# Patient Record
Sex: Female | Born: 1975 | Race: White | Hispanic: No | Smoking: Current every day smoker
Health system: Southern US, Community
[De-identification: ages and names within clinical notes are randomized; demographics above are authoritative.]

## PROBLEM LIST (undated history)

## (undated) DIAGNOSIS — I7410 Embolism and thrombosis of unspecified parts of aorta: Secondary | ICD-10-CM

## (undated) DIAGNOSIS — D735 Infarction of spleen: Secondary | ICD-10-CM

## (undated) HISTORY — DX: Infarction of spleen: D73.5

## (undated) HISTORY — DX: Embolism and thrombosis of unspecified parts of aorta: I74.10

---

## 2006-06-17 ENCOUNTER — Emergency Department: Payer: Self-pay | Admitting: Internal Medicine

## 2006-08-29 ENCOUNTER — Emergency Department: Payer: Self-pay | Admitting: Emergency Medicine

## 2006-10-23 ENCOUNTER — Emergency Department: Payer: Self-pay | Admitting: Emergency Medicine

## 2007-02-07 ENCOUNTER — Emergency Department: Payer: Self-pay | Admitting: Internal Medicine

## 2007-02-07 ENCOUNTER — Emergency Department: Payer: Self-pay | Admitting: Emergency Medicine

## 2007-02-09 ENCOUNTER — Emergency Department: Payer: Self-pay | Admitting: Emergency Medicine

## 2007-04-25 ENCOUNTER — Emergency Department: Payer: Self-pay | Admitting: Emergency Medicine

## 2007-09-18 ENCOUNTER — Observation Stay: Payer: Self-pay | Admitting: Unknown Physician Specialty

## 2007-09-29 ENCOUNTER — Observation Stay: Payer: Self-pay

## 2007-10-11 ENCOUNTER — Ambulatory Visit: Payer: Self-pay | Admitting: Unknown Physician Specialty

## 2007-10-12 ENCOUNTER — Inpatient Hospital Stay: Payer: Self-pay | Admitting: Unknown Physician Specialty

## 2007-10-20 ENCOUNTER — Other Ambulatory Visit: Payer: Self-pay

## 2007-10-20 ENCOUNTER — Emergency Department: Payer: Self-pay | Admitting: Unknown Physician Specialty

## 2007-10-22 ENCOUNTER — Emergency Department: Payer: Self-pay | Admitting: Emergency Medicine

## 2007-10-22 ENCOUNTER — Other Ambulatory Visit: Payer: Self-pay

## 2009-05-29 ENCOUNTER — Emergency Department: Payer: Self-pay | Admitting: Emergency Medicine

## 2011-04-23 ENCOUNTER — Emergency Department: Payer: Self-pay | Admitting: Emergency Medicine

## 2011-04-23 LAB — URINALYSIS, COMPLETE
Bilirubin,UR: NEGATIVE
Ketone: NEGATIVE
Ph: 7 (ref 4.5–8.0)
Protein: 30
RBC,UR: 3 /HPF (ref 0–5)
Specific Gravity: 1.003 (ref 1.003–1.030)
Squamous Epithelial: 4
WBC UR: 53 /HPF (ref 0–5)

## 2011-04-23 LAB — CBC
HCT: 50.5 % — ABNORMAL HIGH (ref 35.0–47.0)
HGB: 17.1 g/dL — ABNORMAL HIGH (ref 12.0–16.0)
MCH: 33 pg (ref 26.0–34.0)
MCHC: 33.9 g/dL (ref 32.0–36.0)
MCV: 98 fL (ref 80–100)
RBC: 5.18 10*6/uL (ref 3.80–5.20)

## 2011-04-23 LAB — PREGNANCY, URINE: Pregnancy Test, Urine: NEGATIVE m[IU]/mL

## 2011-04-23 LAB — COMPREHENSIVE METABOLIC PANEL
Anion Gap: 10 (ref 7–16)
BUN: 2 mg/dL — ABNORMAL LOW (ref 7–18)
Bilirubin,Total: 0.4 mg/dL (ref 0.2–1.0)
Calcium, Total: 8.9 mg/dL (ref 8.5–10.1)
Chloride: 105 mmol/L (ref 98–107)
Co2: 25 mmol/L (ref 21–32)
Creatinine: 0.63 mg/dL (ref 0.60–1.30)
EGFR (African American): >60
Potassium: 3.9 mmol/L (ref 3.5–5.1)
SGOT(AST): 21 U/L (ref 15–37)
Sodium: 140 mmol/L (ref 136–145)
Total Protein: 7.9 g/dL (ref 6.4–8.2)

## 2011-04-23 LAB — LIPASE, BLOOD: Lipase: 98 U/L (ref 73–393)

## 2012-04-06 ENCOUNTER — Emergency Department: Payer: Self-pay | Admitting: Emergency Medicine

## 2014-06-24 ENCOUNTER — Emergency Department: Payer: Self-pay | Admitting: Emergency Medicine

## 2014-12-21 DIAGNOSIS — D735 Infarction of spleen: Secondary | ICD-10-CM

## 2014-12-21 HISTORY — DX: Infarction of spleen: D73.5

## 2015-01-15 ENCOUNTER — Encounter: Payer: Self-pay | Admitting: Emergency Medicine

## 2015-01-15 ENCOUNTER — Emergency Department: Payer: Medicaid Other

## 2015-01-15 ENCOUNTER — Other Ambulatory Visit: Payer: Self-pay

## 2015-01-15 ENCOUNTER — Inpatient Hospital Stay
Admission: EM | Admit: 2015-01-15 | Discharge: 2015-01-18 | DRG: 300 | Disposition: A | Discharge status: Home / Self Care | Payer: Medicaid Other | Attending: Specialist | Admitting: Specialist

## 2015-01-15 DIAGNOSIS — D6859 Other primary thrombophilia: Secondary | ICD-10-CM | POA: Diagnosis present

## 2015-01-15 DIAGNOSIS — I7410 Embolism and thrombosis of unspecified parts of aorta: Secondary | ICD-10-CM

## 2015-01-15 DIAGNOSIS — F17210 Nicotine dependence, cigarettes, uncomplicated: Secondary | ICD-10-CM | POA: Diagnosis present

## 2015-01-15 DIAGNOSIS — D735 Infarction of spleen: Secondary | ICD-10-CM | POA: Diagnosis present

## 2015-01-15 DIAGNOSIS — I7411 Embolism and thrombosis of thoracic aorta: Principal | ICD-10-CM | POA: Diagnosis present

## 2015-01-15 LAB — URINALYSIS COMPLETE WITH MICROSCOPIC (ARMC ONLY)
Bilirubin Urine: NEGATIVE
Glucose, UA: NEGATIVE mg/dL
Hgb urine dipstick: NEGATIVE
Ketones, ur: NEGATIVE mg/dL
Nitrite: NEGATIVE
PROTEIN: NEGATIVE mg/dL
Specific Gravity, Urine: 1.004 — ABNORMAL LOW (ref 1.005–1.030)
pH: 6 (ref 5.0–8.0)

## 2015-01-15 LAB — CBC
HCT: 54.9 % — ABNORMAL HIGH (ref 35.0–47.0)
HEMOGLOBIN: 18.3 g/dL — AB (ref 12.0–16.0)
MCH: 30.5 pg (ref 26.0–34.0)
MCHC: 33.3 g/dL (ref 32.0–36.0)
MCV: 91.6 fL (ref 80.0–100.0)
PLATELETS: 267 10*3/uL (ref 150–440)
RBC: 6 MIL/uL — AB (ref 3.80–5.20)
RDW: 14.3 % (ref 11.5–14.5)
WBC: 16.3 10*3/uL — ABNORMAL HIGH (ref 3.6–11.0)

## 2015-01-15 LAB — COMPREHENSIVE METABOLIC PANEL
ALT: 12 U/L — AB (ref 14–54)
AST: 36 U/L (ref 15–41)
Albumin: 3.6 g/dL (ref 3.5–5.0)
Alkaline Phosphatase: 85 U/L (ref 38–126)
Anion gap: 12 (ref 5–15)
BUN: <5 mg/dL — ABNORMAL LOW (ref 6–20)
CHLORIDE: 101 mmol/L (ref 101–111)
CO2: 20 mmol/L — AB (ref 22–32)
CREATININE: 0.68 mg/dL (ref 0.44–1.00)
Calcium: 9.2 mg/dL (ref 8.9–10.3)
GFR calc non Af Amer: >60 mL/min (ref >60)
Glucose, Bld: 136 mg/dL — ABNORMAL HIGH (ref 65–99)
Potassium: 3.4 mmol/L — ABNORMAL LOW (ref 3.5–5.1)
SODIUM: 133 mmol/L — AB (ref 135–145)
Total Bilirubin: 0.5 mg/dL (ref 0.3–1.2)
Total Protein: 7.3 g/dL (ref 6.5–8.1)

## 2015-01-15 LAB — LIPASE, BLOOD: LIPASE: 24 U/L (ref 22–51)

## 2015-01-15 LAB — PROTIME-INR
INR: 1.08
Prothrombin Time: 14.2 seconds (ref 11.4–15.0)

## 2015-01-15 LAB — POCT PREGNANCY, URINE: PREG TEST UR: NEGATIVE

## 2015-01-15 LAB — APTT: APTT: 28 s (ref 24–36)

## 2015-01-15 MED ORDER — OXYCODONE HCL 5 MG PO TABS
5.0000 mg | ORAL_TABLET | ORAL | Status: DC | PRN
  Administered 2015-01-15 – 2015-01-18 (×9): 5 mg via ORAL
  Filled 2015-01-15 (×9): qty 1

## 2015-01-15 MED ORDER — IOHEXOL 300 MG/ML  SOLN
100.0000 mL | Freq: Once | INTRAMUSCULAR | Status: AC | PRN
  Administered 2015-01-15: 100 mL via INTRAVENOUS
  Filled 2015-01-15: qty 100

## 2015-01-15 MED ORDER — NICOTINE 10 MG IN INHA
1.0000 | RESPIRATORY_TRACT | Status: DC | PRN
  Administered 2015-01-15: 1 via RESPIRATORY_TRACT

## 2015-01-15 MED ORDER — HEPARIN (PORCINE) IN NACL 100-0.45 UNIT/ML-% IJ SOLN: 14.0000 [IU]/kg/h | Freq: Once | INTRAMUSCULAR | Status: DC

## 2015-01-15 MED ORDER — ONDANSETRON HCL 4 MG PO TABS: 4.0000 mg | ORAL_TABLET | Freq: Four times a day (QID) | ORAL | Status: DC | PRN

## 2015-01-15 MED ORDER — MORPHINE SULFATE (PF) 4 MG/ML IV SOLN
INTRAVENOUS | Status: AC
  Administered 2015-01-15: 4 mg via INTRAVENOUS
  Filled 2015-01-15: qty 1

## 2015-01-15 MED ORDER — SODIUM CHLORIDE 0.9 % IV SOLN
INTRAVENOUS | Status: DC
  Administered 2015-01-15: 23:00:00 via INTRAVENOUS
  Administered 2015-01-16: 1000 mL via INTRAVENOUS
  Administered 2015-01-17 – 2015-01-18 (×2): via INTRAVENOUS

## 2015-01-15 MED ORDER — ONDANSETRON HCL 4 MG/2ML IJ SOLN
4.0000 mg | Freq: Four times a day (QID) | INTRAMUSCULAR | Status: DC | PRN
Start: 2015-01-15 — End: 2015-01-18
  Administered 2015-01-16 – 2015-01-17 (×4): 4 mg via INTRAVENOUS
  Filled 2015-01-15 (×4): qty 2

## 2015-01-15 MED ORDER — NICOTINE 10 MG IN INHA: 1.0000 | RESPIRATORY_TRACT | Status: DC | PRN

## 2015-01-15 MED ORDER — SODIUM CHLORIDE 0.9 % IJ SOLN
3.0000 mL | Freq: Two times a day (BID) | INTRAMUSCULAR | Status: DC
  Administered 2015-01-15 – 2015-01-18 (×3): 3 mL via INTRAVENOUS

## 2015-01-15 MED ORDER — ACETAMINOPHEN 325 MG PO TABS: 650.0000 mg | ORAL_TABLET | Freq: Four times a day (QID) | ORAL | Status: DC | PRN

## 2015-01-15 MED ORDER — NICOTINE 10 MG IN INHA
RESPIRATORY_TRACT | Status: AC
  Administered 2015-01-15: 1 via RESPIRATORY_TRACT
  Filled 2015-01-15: qty 36

## 2015-01-15 MED ORDER — HEPARIN BOLUS VIA INFUSION
4100.0000 [IU] | Freq: Once | INTRAVENOUS | Status: AC
  Administered 2015-01-15: 4100 [IU] via INTRAVENOUS
  Filled 2015-01-15: qty 4100

## 2015-01-15 MED ORDER — HEPARIN (PORCINE) IN NACL 100-0.45 UNIT/ML-% IJ SOLN
1450.0000 [IU]/h | INTRAMUSCULAR | Status: DC
  Administered 2015-01-15: 1150 [IU]/h via INTRAVENOUS
  Administered 2015-01-16: 1300 [IU]/h via INTRAVENOUS
  Administered 2015-01-17 – 2015-01-18 (×2): 1450 [IU]/h via INTRAVENOUS
  Filled 2015-01-15 (×8): qty 250

## 2015-01-15 MED ORDER — MORPHINE SULFATE (PF) 4 MG/ML IV SOLN
4.0000 mg | Freq: Once | INTRAVENOUS | Status: AC
  Administered 2015-01-15: 4 mg via INTRAVENOUS

## 2015-01-15 MED ORDER — HEPARIN SODIUM (PORCINE) 5000 UNIT/ML IJ SOLN: 4000.0000 [IU] | Freq: Once | INTRAMUSCULAR | Status: DC

## 2015-01-15 MED ORDER — ONDANSETRON HCL 4 MG/2ML IJ SOLN
4.0000 mg | Freq: Once | INTRAMUSCULAR | Status: AC
  Administered 2015-01-15: 4 mg via INTRAVENOUS

## 2015-01-15 MED ORDER — ONDANSETRON HCL 4 MG/2ML IJ SOLN
INTRAMUSCULAR | Status: AC
  Administered 2015-01-15: 4 mg via INTRAVENOUS
  Filled 2015-01-15: qty 2

## 2015-01-15 MED ORDER — IOHEXOL 240 MG/ML SOLN
25.0000 mL | Freq: Once | INTRAMUSCULAR | Status: AC | PRN
  Administered 2015-01-15: 25 mL via ORAL
  Filled 2015-01-15: qty 25

## 2015-01-15 MED ORDER — ACETAMINOPHEN 650 MG RE SUPP: 650.0000 mg | Freq: Four times a day (QID) | RECTAL | Status: DC | PRN

## 2015-01-15 MED ORDER — LORAZEPAM 2 MG/ML IJ SOLN
2.0000 mg | Freq: Once | INTRAMUSCULAR | Status: AC
  Administered 2015-01-15: 2 mg via INTRAVENOUS
  Filled 2015-01-15: qty 1

## 2015-01-15 MED ORDER — IOHEXOL 350 MG/ML SOLN
75.0000 mL | Freq: Once | INTRAVENOUS | Status: AC | PRN
  Administered 2015-01-15: 75 mL via INTRAVENOUS
  Filled 2015-01-15: qty 75

## 2015-01-15 MED ORDER — MORPHINE SULFATE (PF) 2 MG/ML IV SOLN
2.0000 mg | INTRAVENOUS | Status: DC | PRN
  Administered 2015-01-16 (×2): 2 mg via INTRAVENOUS
  Filled 2015-01-15 (×2): qty 1

## 2015-01-15 NOTE — ED Notes (Signed)
Pt with epigastric pain started yesterday and worse today. Pt states she feels like someone is smashing her insides, non radiating.

## 2015-01-15 NOTE — ED Provider Notes (Signed)
[facility] Emergency Department Provider Note  ____________________________________________  Time seen: 7:05 PM  I have reviewed the triage vital signs and the nursing notes.   HISTORY  Chief Complaint Abdominal Pain      HPI Suzanne [name] is a 39 y.o. female presents with epigastric/midline abdominal pain 2 days accompanied by nonbloody vomiting. Patient denies any fever no diarrhea or constipation. Patient does admit to drinking approximately a 12 pack of beer daily however has not done so in the past 2 days     Past medical history None There are no active problems to display for this patient.   Past surgical history C-section No current outpatient prescriptions on file.  Allergies No known drug allergies No family history on file.  Social History Social History  Substance Use Topics  . Smoking status: Current Some Day Smoker  . Smokeless tobacco: None  . Alcohol Use: Yes    Review of Systems  Constitutional: Negative for fever. Eyes: Negative for visual changes. ENT: Negative for sore throat. Cardiovascular: Negative for chest pain. Respiratory: Negative for shortness of breath. Gastrointestinal: Positive for abdominal pain and vomiting Genitourinary: Negative for dysuria. Musculoskeletal: Negative for back pain. Skin: Negative for rash. Neurological: Negative for headaches, focal weakness or numbness.   10-point ROS otherwise negative.  ____________________________________________   PHYSICAL EXAM:  VITAL SIGNS: ED Triage Vitals  Enc Vitals Group     BP 01/15/15 1610 169/98 mmHg     Pulse Rate 01/15/15 1610 89     Resp 01/15/15 1610 18     Temp 01/15/15 1610 98.5 F (36.9 C)     Temp Source 01/15/15 1610 Oral     SpO2 01/15/15 1610 97 %     Weight 01/15/15 1610 148 lb (67.132 kg)     Height 01/15/15 1610 5\' 7"  (1.702 m)     Head Cir --      Peak Flow --      Pain Score 01/15/15 1611 10     Pain Loc --       Pain Edu? --      Excl. in [place]? --      Constitutional: Alert and oriented. Well appearing and in no distress. Eyes: Conjunctivae are normal. PERRL. Normal extraocular movements. ENT   Head: Normocephalic and atraumatic.   Nose: No congestion/rhinnorhea.   Mouth/Throat: Mucous membranes are moist.   Neck: No stridor. Hematological/Lymphatic/Immunilogical: No cervical lymphadenopathy. Cardiovascular: Normal rate, regular rhythm. Normal and symmetric distal pulses are present in all extremities. No murmurs, rubs, or gallops. Respiratory: Normal respiratory effort without tachypnea nor retractions. Breath sounds are clear and equal bilaterally. No wheezes/rales/rhonchi. Gastrointestinal: Epigastric tenderness to palpation No distention. There is no CVA tenderness. Genitourinary: deferred Musculoskeletal: Nontender with normal range of motion in all extremities. No joint effusions.  No lower extremity tenderness nor edema. Neurologic:  Normal speech and language. No gross focal neurologic deficits are appreciated. Speech is normal.  Skin:  Skin is warm, dry and intact. No rash noted. Psychiatric: Mood and affect are normal. Speech and behavior are normal. Patient exhibits appropriate insight and judgment.  ____________________________________________    LABS (pertinent positives/negatives)  Labs Reviewed  COMPREHENSIVE METABOLIC PANEL - Abnormal; Notable for the following:    Sodium 133 (*)    Potassium 3.4 (*)    CO2 20 (*)    Glucose, Bld 136 (*)    BUN <5 (*)    ALT 12 (*)    All other components within normal  limits  CBC - Abnormal; Notable for the following:    WBC 16.3 (*)    RBC 6.00 (*)    Hemoglobin 18.3 (*)    HCT 54.9 (*)    All other components within normal limits  LIPASE, BLOOD  URINALYSIS COMPLETEWITH MICROSCOPIC ([facility] ONLY)    ____________________________________________   EKG  ED ECG REPORT I, Akhilesh Sassone, [place], the attending physician,  personally viewed and interpreted this ECG.   Date: 01/15/2015  EKG Time: 4:21 PM  Rate: 62  Rhythm: Normal sinus rhythm  Axis: None  Intervals: Normal  ST&T Change: None   ____________________________________________    RADIOLOGY  CT ANGIO CHEST AORTA W/CM &/OR WO/CM (Final result) Result time: 01/15/15 21:05:56   Final result by Rad Results In Interface (01/15/15 21:05:56)   Narrative:   CLINICAL DATA: Aortic thrombus seen on abdominal CT. Evaluate for thoracic origins/extension. Acute embolic splenic infarction.  EXAM: CT ANGIOGRAPHY CHEST WITH CONTRAST  TECHNIQUE: Multidetector CT imaging of the chest was performed using the standard protocol during bolus administration of intravenous contrast. Multiplanar CT image reconstructions and MIPs were obtained to evaluate the vascular anatomy.  CONTRAST: 77mL OMNIPAQUE IOHEXOL 350 MG/ML SOLN  COMPARISON: None.  FINDINGS: Bones: Mild thoracic spondylosis. No aggressive osseous lesion.  Cardiovascular: There is no aortic dissection. The ascending aorta and aortic arch appear normal. Three vessel aortic arch with normal opacification of the branch vessels. In the descending thoracic aorta, the previously seen mural thrombus is present. This becomes pedunculated at the level of the esophageal hiatus. The pedunculated intraluminal component measures 31 mm in length (image 86 series 8). Total length of the thrombus is 10.7 cm, extending from the superior LEFT atrium to the level of the diaphragmatic crus. This arises from the anterior wall of the descending thoracic aorta. No underlying aortic injury is identified to account for thrombus formation. This raises the possibility of a hypercoagulable state.  Lungs: Paraseptal emphysema. No airspace disease.  Central airways: Patent.  Effusions: None.  Lymphadenopathy: No axillary adenopathy. No mediastinal or hilar adenopathy.  Esophagus: Normal.  Other:  None.  Review of the MIP images confirms the above findings.  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 No aortic dissection or underlying acute aortic abnormality to account for the thrombus formation.   Electronically Signed By: Dereck Ligas M.D. On: 01/15/2015 21:05          CT Abdomen Pelvis W Contrast (Final result) Result time: 01/15/15 20:25:10   Final result by Rad Results In Interface (01/15/15 20:25:10)   Narrative:   CLINICAL DATA: Midline abdominal pain. Vomiting. Periumbilical pain. Leukocytosis.  EXAM: CT ABDOMEN AND PELVIS WITH CONTRAST  TECHNIQUE: Multidetector CT imaging of the abdomen and pelvis was performed using the standard protocol following bolus administration of intravenous contrast.  CONTRAST: 123mL OMNIPAQUE IOHEXOL 300 MG/ML SOLN  COMPARISON: None.  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 Marginal osteophytes are also present in the RIGHT femoral head. dextroconvex curve of the thoracolumbar spine may be positional.  Lung Bases: Dependent atelectasis.  Liver: Fatty infiltration adjacent to the falciform ligament of the liver. Mild hepatomegaly with 18.5 cm liver span and rounding of the inferior RIGHT hepatic lobe.  Spleen: Infarction of the spleen is present involving the anterior and superior portions. This is probably acute or subacute.  Gallbladder: Normal.  Common bile duct: Normal.  Pancreas: Mild prominence of pancreatic duct. No obstructing lesion identified. Small lipoma is present in the anterior body of the pancreas.  Adrenal glands: Normal bilaterally.  Kidneys: LEFT kidney appears normal. No calculi. The LEFT ureter is normal.  The RIGHT kidney demonstrates areas of scarring. In the interpolar region,  there is an area of cortical thinning which is most compatible with scarring rather than acute infarct. There are no discrete wedge-shaped perfusion defects in the kidneys. The RIGHT ureter appears normal.  Stomach: Distended with oral contrast. No inflammatory changes.  Small bowel: No obstruction or evidence of intestinal ischemia. No mural thickening.  Colon: Normal appendix. Ascending colon appears normal. Transverse colon is normal. Descending and rectosigmoid colon at normal.  Pelvic Genitourinary: Physiologic appearance of the uterus. Retroverted uterus. Urinary bladder appears normal.  Peritoneum: Physiologic free fluid in the anatomic pelvis. No free air.  Vascular/lymphatic: There is a large exophytic thrombus in the descending thoracic aorta. This probably represents an embolic source of infarction of the spleen. The distal aorta appears normal. Mild iliofemoral atherosclerotic calcification.  Body Wall: Normal.  IMPRESSION: 1. Large descending thoracic aortic thrombus extending from the superior margin of the scan to the aortic hiatus. This likely represents the source of embolic infarction to the spleen. No abdominal aortic dissection or aneurysm. 2. Acute or subacute anterior splenic infarct. 3. 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 4. No findings of acute intestinal ischemia. Mesenteric vasculature appears patent. 5. Mild hepatomegaly. Critical Value/emergent results were called by telephone at the time of interpretation on 01/15/2015 at 8:16 pm to Dr. Marjean Donna , who verbally acknowledged these results. Vascular consultation was discussed during the phone call. Contrast-enhanced imaging of the chest should be considered to fully visualize the aortic thrombus which is only partially seen on this abdominal study.   Electronically Signed By: Dereck Ligas M.D. On: 01/15/2015 20:25      ____________________________________________     Critical Care performed: CRITICAL CARE Performed by: Gregor Hams   Total critical care time: 60 minutes  Critical care time was exclusive of separately billable procedures and treating other patients.  Critical care was necessary to treat or prevent imminent or life-threatening deterioration.  Critical care was time spent personally by me on the following activities: development of treatment plan with patient and/or surrogate as well as nursing, discussions with consultants, evaluation of patient's response to treatment, examination of patient, obtaining history from patient or surrogate, ordering and performing treatments and interventions, ordering and review of laboratory studies, ordering and review of radiographic studies, pulse oximetry and re-evaluation of patient's condition. care  ____________________________________________   INITIAL IMPRESSION / ASSESSMENT AND PLAN / ED COURSE  Pertinent labs & imaging results that were available during my care of the patient were reviewed by me and considered in my medical decision making (see chart for details). Patient received morphine 4 mg and Zofran 4 mg for analgesia and antiemetic respectively. Heparin bolus and drip instituted after discussing the patient with Dr. dew vascular surgeon on call. All clinical findings were discussed with the patient. Patient was discussed with Dr. Lavetta Nielsen hospitalist on call for hospital admission. ____________________________________________   FINAL CLINICAL IMPRESSION(S) / ED DIAGNOSES  Final diagnoses:  Aortic thrombus      Gregor Hams, MD 01/17/15 [phone]

## 2015-01-15 NOTE — Consult Note (Signed)
ANTICOAGULATION CONSULT NOTE - Initial Consult  Pharmacy Consult for heparin Indication: DVT  No Known Allergies  Patient Measurements: Height: 5\' 7"  (170.2 cm) Weight: 148 lb (67.132 kg) IBW/kg (Calculated) : 61.6 Heparin Dosing Weight: 67.1kg  Vital Signs: Temp: 97.9 F (36.6 C) (09/26 2013) Temp Source: Oral (09/26 2013) BP: 167/88 mmHg (09/26 2013) Pulse Rate: 89 (09/26 1610)  Labs:  Recent Labs  01/15/15 1614  HGB 18.3*  HCT 54.9*  PLT 267  CREATININE 0.68    Estimated Creatinine Clearance: 92.7 mL/min (by C-G formula based on Cr of 0.68).   Medical History: History reviewed. No pertinent past medical history.  Medications:  Scheduled:  . heparin  4,100 Units Intravenous Once  . sodium chloride  3 mL Intravenous Q12H    Assessment: Pt is a 39 year old female. Pharmacy consulted to dose heparin for a VTE. Pt does not take anticoagulants at home. Goal of Therapy:  Heparin level 0.3-0.7 units/ml Monitor platelets by anticoagulation protocol: Yes   Plan:  Give 4100 units bolus x 1 Start heparin infusion at 1150 units/hr Check anti-Xa level in 6 hours and daily while on heparin Continue to monitor H&H and platelets  Jonte Wollam D Janifer Gieselman 01/15/2015,9:20 PM

## 2015-01-15 NOTE — ED Notes (Signed)
Pt drinking GI cocktail

## 2015-01-15 NOTE — ED Notes (Signed)
MD at bedside. 

## 2015-01-15 NOTE — H&P (Signed)
Suzanne [name] at [place] NAME: Suzanne [name]    MR#:  [id number]  DATE OF BIRTH:  05/25/1975   DATE OF ADMISSION:  01/15/2015  PRIMARY CARE PHYSICIAN: No PCP Per Patient   REQUESTING/REFERRING PHYSICIAN: Owens Shark  CHIEF COMPLAINT:   Chief Complaint  Patient presents with  . Abdominal Pain    HISTORY OF PRESENT ILLNESS:  Suzanne [name]  is a 39 y.o. female without significant medical history presenting with abdominal pain. She describes one day duration of abdominal pain epigastric/left upper quadrant described as "pain" intensity 10/10 nonradiating and no worsening or relieving factors. She also describes having associated nausea vomiting nonbloody nonbilious emesis. Initial workup revealed descending aortic thrombus as well as splenic infarct.  PAST MEDICAL HISTORY:  History reviewed. No pertinent past medical history.  PAST SURGICAL HISTORY:  History reviewed. No pertinent past surgical history.  SOCIAL HISTORY:   Social History  Substance Use Topics  . Smoking status: Current Some Day Smoker  . Smokeless tobacco: Not on file  . Alcohol Use: Yes    FAMILY HISTORY:   Family History  Problem Relation Age of Onset  . Adopted: Yes    DRUG ALLERGIES:  No Known Allergies  REVIEW OF SYSTEMS:  REVIEW OF SYSTEMS:  CONSTITUTIONAL: Denies fevers, chills, fatigue, weakness.  EYES: Denies blurred vision, double vision, or eye pain.  EARS, NOSE, THROAT: Denies tinnitus, ear pain, hearing loss.  RESPIRATORY: denies cough, shortness of breath, wheezing  CARDIOVASCULAR: Denies chest pain, palpitations, edema.  GASTROINTESTINAL:Positive nausea , vomiting,  abdominal pain.  GENITOURINARY: Denies dysuria, hematuria.  ENDOCRINE: Denies nocturia or thyroid problems. HEMATOLOGIC AND LYMPHATIC: Denies easy bruising or bleeding.  SKIN: Denies rash or lesions.  MUSCULOSKELETAL: Denies pain in neck, back, shoulder, knees, hips, or  further arthritic symptoms.  NEUROLOGIC: Denies paralysis, paresthesias.  PSYCHIATRIC:Positive anxiety denies depressive symptoms Otherwise full review of systems performed by me is negative.   MEDICATIONS AT HOME:   Prior to Admission medications   Medication Sig Start Date End Date Taking? Authorizing Laveta Gilkey  ibuprofen (ADVIL,MOTRIN) 800 MG tablet Take 800 mg by mouth every 4 (four) hours as needed for mild pain.   Yes Historical Coretta Leisey, MD      VITAL SIGNS:  Blood pressure 166/82, pulse 86, temperature 97.9 F (36.6 C), temperature source Oral, resp. rate 18, height 5\' 7"  (1.702 m), weight 148 lb (67.132 kg), last menstrual period 01/01/2015, SpO2 98 %.  PHYSICAL EXAMINATION:  VITAL SIGNS: Filed Vitals:   01/15/15 2148  BP: 166/82  Pulse: 86  Temp:   Resp: 18   GENERAL:39 y.o.female currently in no acute distress.  HEAD: Normocephalic, atraumatic.  EYES: Pupils equal, round, reactive to light. Extraocular muscles intact. No scleral icterus.  MOUTH: Moist mucosal membrane. Dentition intact. No abscess noted.  EAR, NOSE, THROAT: Clear without exudates. No external lesions.  NECK: Supple. No thyromegaly. No nodules. No JVD.  PULMONARY: Clear to ascultation, without wheeze rails or rhonci. No use of accessory muscles, Good respiratory effort. good air entry bilaterally CHEST: Nontender to palpation.  CARDIOVASCULAR: S1 and S2. Regular rate and rhythm. No murmurs, rubs, or gallops. No edema. Pedal pulses 2+ bilaterally.  GASTROINTESTINAL: Soft,minimal tenderness to palpation left upper quadrant without rebound or guarding, nondistended. No masses. Positive bowel sounds. No hepatosplenomegaly.  MUSCULOSKELETAL: No swelling, clubbing, or edema. Range of motion full in all extremities.  NEUROLOGIC: Cranial nerves II through XII are intact. No gross focal neurological deficits. Sensation intact.  Reflexes intact.  SKIN: No ulceration, lesions, rashes, or cyanosis. Skin warm and  dry. Turgor intact.  PSYCHIATRIC: Mood, affect anxious. The patient is awake, alert and oriented x 3. Insight, judgment intact.    LABORATORY PANEL:   CBC  Recent Labs Lab 01/15/15 1614  WBC 16.3*  HGB 18.3*  HCT 54.9*  PLT 267   ------------------------------------------------------------------------------------------------------------------  Chemistries   Recent Labs Lab 01/15/15 1614  NA 133*  K 3.4*  CL 101  CO2 20*  GLUCOSE 136*  BUN <5*  CREATININE 0.68  CALCIUM 9.2  AST 36  ALT 12*  ALKPHOS 85  BILITOT 0.5   ------------------------------------------------------------------------------------------------------------------  Cardiac Enzymes No results for input(s): TROPONINI in the last 168 hours. ------------------------------------------------------------------------------------------------------------------  RADIOLOGY:  Ct Abdomen Pelvis W Contrast  01/15/2015   CLINICAL DATA:  Midline abdominal pain. Vomiting. Periumbilical pain. Leukocytosis.  EXAM: CT ABDOMEN AND PELVIS WITH CONTRAST  TECHNIQUE: Multidetector CT imaging of the abdomen and pelvis was performed using the standard protocol following bolus administration of intravenous contrast.  CONTRAST:  174mL OMNIPAQUE IOHEXOL 300 MG/ML  SOLN  COMPARISON:  None.  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 Marginal osteophytes are also present in the RIGHT femoral head. dextroconvex curve of the thoracolumbar spine may be positional.  Lung Bases: Dependent atelectasis.  Liver: Fatty infiltration adjacent to the falciform ligament of the liver. Mild hepatomegaly with 18.5 cm liver span and rounding of the inferior RIGHT hepatic lobe.  Spleen: Infarction of the spleen is present involving the anterior and superior portions. This is probably acute or subacute.  Gallbladder:  Normal.  Common bile duct:  Normal.  Pancreas: Mild  prominence of pancreatic duct. No obstructing lesion identified. Small lipoma is present in the anterior body of the pancreas.  Adrenal glands:  Normal bilaterally.  Kidneys: LEFT kidney appears normal. No calculi. The LEFT ureter is normal.  The RIGHT kidney demonstrates areas of scarring. In the interpolar region, there is an area of cortical thinning which is most compatible with scarring rather than acute infarct. There are no discrete wedge-shaped perfusion defects in the kidneys. The RIGHT ureter appears normal.  Stomach:  Distended with oral contrast.  No inflammatory changes.  Small bowel: No obstruction or evidence of intestinal ischemia. No mural thickening.  Colon: Normal appendix. Ascending colon appears normal. Transverse colon is normal. Descending and rectosigmoid colon at normal.  Pelvic Genitourinary: Physiologic appearance of the uterus. Retroverted uterus. Urinary bladder appears normal.  Peritoneum: Physiologic free fluid in the anatomic pelvis. No free air.  Vascular/lymphatic: There is a large exophytic thrombus in the descending thoracic aorta. This probably represents an embolic source of infarction of the spleen. The distal aorta appears normal. Mild iliofemoral atherosclerotic calcification.  Body Wall: Normal.  IMPRESSION: 1. Large descending thoracic aortic thrombus extending from the superior margin of the scan to the aortic hiatus. This likely represents the source of embolic infarction to the spleen. No abdominal aortic dissection or aneurysm. 2. Acute or subacute anterior splenic infarct. 3. 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 4. No findings of acute intestinal ischemia. Mesenteric vasculature appears patent. 5. Mild hepatomegaly. Critical Value/emergent results were called by telephone at the time of interpretation on 01/15/2015 at 8:16 pm to Dr. Marjean Donna , who verbally acknowledged these results. Vascular consultation was discussed during the phone  call. Contrast-enhanced imaging of the chest should be considered to fully visualize the aortic thrombus which is  only partially seen on this abdominal study.   Electronically Signed   By: Dereck Ligas M.D.   On: 01/15/2015 20:25   Ct Angio Chest Aorta W/cm &/or Wo/cm  01/15/2015   CLINICAL DATA:  Aortic thrombus seen on abdominal CT. Evaluate for thoracic origins/extension. Acute embolic splenic infarction.  EXAM: CT ANGIOGRAPHY CHEST WITH CONTRAST  TECHNIQUE: Multidetector CT imaging of the chest was performed using the standard protocol during bolus administration of intravenous contrast. Multiplanar CT image reconstructions and MIPs were obtained to evaluate the vascular anatomy.  CONTRAST:  52mL OMNIPAQUE IOHEXOL 350 MG/ML SOLN  COMPARISON:  None.  FINDINGS: Bones: Mild thoracic spondylosis.  No aggressive osseous lesion.  Cardiovascular: There is no aortic dissection. The ascending aorta and aortic arch appear normal. Three vessel aortic arch with normal opacification of the branch vessels. In the descending thoracic aorta, the previously seen mural thrombus is present. This becomes pedunculated at the level of the esophageal hiatus. The pedunculated intraluminal component measures 31 mm in length (image 86 series 8). Total length of the thrombus is 10.7 cm, extending from the superior LEFT atrium to the level of the diaphragmatic crus. This arises from the anterior wall of the descending thoracic aorta. No underlying aortic injury is identified to account for thrombus formation. This raises the possibility of a hypercoagulable state.  Lungs: Paraseptal emphysema.  No airspace disease.  Central airways: Patent.  Effusions: None.  Lymphadenopathy: No axillary adenopathy. No mediastinal or hilar adenopathy.  Esophagus: Normal.  Other: None.  Review of the MIP images confirms the above findings.  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 No aortic dissection or underlying acute aortic abnormality to account for the thrombus formation.   Electronically Signed   By: Dereck Ligas M.D.   On: 01/15/2015 21:05    EKG:   Orders placed or performed during the hospital encounter of 01/15/15  . EKG 12-Lead  . EKG 12-Lead    IMPRESSION AND PLAN:   39 year old Caucasian female without significant past history presented with abdominal pain found to stomach and proximal/aortic thrombus  1. Descending aortic thrombus: Case discussed with vascular surgery by emergency department staff, already initiated on heparin drip. In detailed history she has had 2 prior early trimester miscarriages without further workup, she denies any knowledge family history bleeding/blood clot/sudden death however states she is adopted. We'll check PTT/INR prior to heparin bolus if not already given. Suspect abnormally elevated PTT which could indicate lupus anticoagulate, she will need more formal hypercoagulable workup in the future 2. Splenic infarct: Pain control 3. Venous thromboembolism prophylactic: Therapeutic heparin   All the records are reviewed and case discussed with ED Sarajane Fambrough. Management plans discussed with the patient, family and they are in agreement.  CODE STATUS: Full  TOTAL TIME TAKING CARE OF THIS PATIENT: 45  minutes.    Hower,  Karenann Cai.D on 01/15/2015 at 10:17 PM  Between 7am to 6pm - Pager - [phone]  After 6pm: House Pager: - [phone]  [facility]  Office  [phone]  CC: Primary care physician; No PCP Per Patient

## 2015-01-16 DIAGNOSIS — R1012 Left upper quadrant pain: Secondary | ICD-10-CM

## 2015-01-16 DIAGNOSIS — D72829 Elevated white blood cell count, unspecified: Secondary | ICD-10-CM

## 2015-01-16 DIAGNOSIS — F17210 Nicotine dependence, cigarettes, uncomplicated: Secondary | ICD-10-CM

## 2015-01-16 DIAGNOSIS — D735 Infarction of spleen: Secondary | ICD-10-CM

## 2015-01-16 DIAGNOSIS — R160 Hepatomegaly, not elsewhere classified: Secondary | ICD-10-CM

## 2015-01-16 DIAGNOSIS — M1611 Unilateral primary osteoarthritis, right hip: Secondary | ICD-10-CM

## 2015-01-16 DIAGNOSIS — R590 Localized enlarged lymph nodes: Secondary | ICD-10-CM

## 2015-01-16 DIAGNOSIS — N854 Malposition of uterus: Secondary | ICD-10-CM

## 2015-01-16 DIAGNOSIS — Z7901 Long term (current) use of anticoagulants: Secondary | ICD-10-CM

## 2015-01-16 DIAGNOSIS — Z79899 Other long term (current) drug therapy: Secondary | ICD-10-CM

## 2015-01-16 DIAGNOSIS — N13729 Vesicoureteral-reflux with reflux nephropathy without hydroureter, unspecified: Secondary | ICD-10-CM

## 2015-01-16 DIAGNOSIS — I7411 Embolism and thrombosis of thoracic aorta: Principal | ICD-10-CM

## 2015-01-16 LAB — CBC
HEMATOCRIT: 49.9 % — AB (ref 35.0–47.0)
Hemoglobin: 16.5 g/dL — ABNORMAL HIGH (ref 12.0–16.0)
MCH: 31.2 pg (ref 26.0–34.0)
MCHC: 33.1 g/dL (ref 32.0–36.0)
MCV: 94.4 fL (ref 80.0–100.0)
Platelets: 205 10*3/uL (ref 150–440)
RBC: 5.29 MIL/uL — AB (ref 3.80–5.20)
RDW: 14.4 % (ref 11.5–14.5)
WBC: 13.4 10*3/uL — AB (ref 3.6–11.0)

## 2015-01-16 LAB — COMPREHENSIVE METABOLIC PANEL
ALT: 9 U/L — ABNORMAL LOW (ref 14–54)
ANION GAP: 10 (ref 5–15)
AST: 16 U/L (ref 15–41)
Albumin: 2.9 g/dL — ABNORMAL LOW (ref 3.5–5.0)
Alkaline Phosphatase: 70 U/L (ref 38–126)
BILIRUBIN TOTAL: 0.7 mg/dL (ref 0.3–1.2)
BUN: <5 mg/dL — ABNORMAL LOW (ref 6–20)
CHLORIDE: 106 mmol/L (ref 101–111)
CO2: 22 mmol/L (ref 22–32)
Calcium: 8.1 mg/dL — ABNORMAL LOW (ref 8.9–10.3)
Creatinine, Ser: 0.5 mg/dL (ref 0.44–1.00)
Glucose, Bld: 96 mg/dL (ref 65–99)
POTASSIUM: 3.4 mmol/L — AB (ref 3.5–5.1)
Sodium: 138 mmol/L (ref 135–145)
Total Protein: 5.9 g/dL — ABNORMAL LOW (ref 6.5–8.1)

## 2015-01-16 LAB — HEPARIN LEVEL (UNFRACTIONATED)
HEPARIN UNFRACTIONATED: 0.31 [IU]/mL (ref 0.30–0.70)
Heparin Unfractionated: 0.26 IU/mL — ABNORMAL LOW (ref 0.30–0.70)
Heparin Unfractionated: 0.24 IU/mL — ABNORMAL LOW (ref 0.30–0.70)

## 2015-01-16 MED ORDER — HYDROMORPHONE HCL 1 MG/ML IJ SOLN
1.0000 mg | INTRAMUSCULAR | Status: DC | PRN
Start: 2015-01-16 — End: 2015-01-18
  Administered 2015-01-16: 1 mg via INTRAVENOUS
  Filled 2015-01-16: qty 1

## 2015-01-16 MED ORDER — HEPARIN BOLUS VIA INFUSION
1000.0000 [IU] | Freq: Once | INTRAVENOUS | Status: AC
  Administered 2015-01-16: 1000 [IU] via INTRAVENOUS
  Filled 2015-01-16: qty 1000

## 2015-01-16 MED ORDER — WARFARIN SODIUM 10 MG PO TABS
10.0000 mg | ORAL_TABLET | Freq: Once | ORAL | Status: AC
  Administered 2015-01-16: 10 mg via ORAL
  Filled 2015-01-16: qty 1

## 2015-01-16 MED ORDER — WARFARIN - PHARMACIST DOSING INPATIENT
Freq: Every day | Status: DC
  Administered 2015-01-17: 18:00:00

## 2015-01-16 NOTE — Progress Notes (Signed)
ANTICOAGULATION CONSULT NOTE - Initial Consult  Pharmacy Consult for Warfarin dosing and monitoring Indication: DVT  No Known Allergies  Patient Measurements: Height: 5\' 7"  (170.2 cm) Weight: 148 lb (67.132 kg) IBW/kg (Calculated) : 61.6  Vital Signs: Temp: 98.2 F (36.8 C) (09/27 0604) Temp Source: Oral (09/27 0604) BP: 129/66 mmHg (09/27 0604) Pulse Rate: 60 (09/27 0604)  Labs:  Recent Labs  01/15/15 1614 01/16/15 0617  HGB 18.3* 16.5*  HCT 54.9* 49.9*  PLT 267 205  APTT 28  --   LABPROT 14.2  --   INR 1.08  --   HEPARINUNFRC  --  0.26*  CREATININE 0.68 0.50    Estimated Creatinine Clearance: 92.7 mL/min (by C-G formula based on Cr of 0.5).   Medical History: History reviewed. No pertinent past medical history.  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 Pharmacy consulted for warfarin dosing. Patient currently on heparin drip at 1150 unit per hour. Heparin level at 0617 today 0.26. Baseline INR 1.08 aPTT 28.   Goal of Therapy:  INR 2-3 Heparin level 0.3-0.7 units/ml    Plan:  Will start patient on warfarin 10mg  today. Will order PT/INR for 9/28 @0500 .   Heparin level scheduled for 1400 today. Will continue to monitor patient levels and make adjustments as needed.   Will counsel patient on new warfarin start prior to discharge.   Nancy Fetter, PharmD Pharmacy Resident

## 2015-01-16 NOTE — Progress Notes (Signed)
Full note to follow In short, aortic thrombus with splenic infarct Anticoagulation.  No acute intervention planned unless embolization further develops OK to advance diet Repeat CT scan 48-72 hours to assess for changes Will follow

## 2015-01-16 NOTE — Progress Notes (Signed)
[place] at [facility]                                                                                                                                                                                            Patient Demographics   Suzanne [name], is a 39 y.o. female, DOB - 04/10/1976, FFM:[id number]  Admit date - 01/15/2015   Admitting Physician Lytle Butte, MD  Outpatient Primary MD for the patient is No PCP Per Patient   LOS - 1  Subjective: Still has some abdominal pain. Denies any chest pain or shortness of breath no nausea   Review of Systems:   CONSTITUTIONAL: No documented fever. No fatigue, weakness. No weight gain, no weight loss.  EYES: No blurry or double vision.  ENT: No tinnitus. No postnasal drip. No redness of the oropharynx.  RESPIRATORY: No cough, no wheeze, no hemoptysis. No dyspnea.  CARDIOVASCULAR: No chest pain. No orthopnea. No palpitations. No syncope.  GASTROINTESTINAL: No nausea, no vomiting or diarrhea. Positive abdominal pain. No melena or hematochezia.  GENITOURINARY: No dysuria or hematuria.  ENDOCRINE: No polyuria or nocturia. No heat or cold intolerance.  HEMATOLOGY: No anemia. No bruising. No bleeding.  INTEGUMENTARY: No rashes. No lesions.  MUSCULOSKELETAL: No arthritis. No swelling. No gout.  NEUROLOGIC: No numbness, tingling, or ataxia. No seizure-type activity.  PSYCHIATRIC: No anxiety. No insomnia. No ADD.    Vitals:   Filed Vitals:   01/15/15 2013 01/15/15 2148 01/15/15 2230 01/16/15 0604  BP: 167/88 166/82 148/97 129/66  Pulse:  86 60 60  Temp: 97.9 F (36.6 C)  98.1 F (36.7 C) 98.2 F (36.8 C)  TempSrc: Oral  Oral Oral  Resp:  18 16 16   Height:      Weight:      SpO2: 99% 98% 99% 98%    Wt Readings from Last 3 Encounters:  01/15/15 67.132 kg (148 lb)     Intake/Output Summary (Last 24 hours) at 01/16/15 1112 Last data filed at 01/16/15 0916  Gross per 24 hour  Intake    652 ml   Output    200 ml  Net    452 ml    Physical Exam:   GENERAL: Pleasant-appearing in no apparent distress.  HEAD, EYES, EARS, NOSE AND THROAT: Atraumatic, normocephalic. Extraocular muscles are intact. Pupils equal and reactive to light. Sclerae anicteric. No conjunctival injection. No oro-pharyngeal erythema.  NECK: Supple. There is no jugular venous distention. No bruits, no lymphadenopathy, no thyromegaly.  HEART: Regular rate and rhythm,. No murmurs, no rubs, no clicks.  LUNGS: Clear to auscultation bilaterally. No rales  or rhonchi. No wheezes.  ABDOMEN: Soft, flat, nontender, nondistended. Has good bowel sounds. No hepatosplenomegaly appreciated.  EXTREMITIES: No evidence of any cyanosis, clubbing, or peripheral edema.  +2 pedal and radial pulses bilaterally.  NEUROLOGIC: The patient is alert, awake, and oriented x3 with no focal motor or sensory deficits appreciated bilaterally.  SKIN: Moist and warm with no rashes appreciated.  Psych: Not anxious, depressed LN: No inguinal LN enlargement    Antibiotics   Anti-infectives    None      Medications   Scheduled Meds: . sodium chloride  3 mL Intravenous Q12H   Continuous Infusions: . sodium chloride 1,000 mL (01/16/15 1055)  . heparin 1,300 Units/hr (01/16/15 0746)   PRN Meds:.acetaminophen **OR** acetaminophen, HYDROmorphone (DILAUDID) injection, nicotine, ondansetron **OR** ondansetron (ZOFRAN) IV, oxyCODONE   Data Review:   Micro Results No results found for this or any previous visit (from the past 240 hour(s)).  Radiology Reports Ct Abdomen Pelvis W Contrast  01/15/2015   CLINICAL DATA:  Midline abdominal pain. Vomiting. Periumbilical pain. Leukocytosis.  EXAM: CT ABDOMEN AND PELVIS WITH CONTRAST  TECHNIQUE: Multidetector CT imaging of the abdomen and pelvis was performed using the standard protocol following bolus administration of intravenous contrast.  CONTRAST:  142mL OMNIPAQUE IOHEXOL 300 MG/ML  SOLN   COMPARISON:  None.  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 Marginal osteophytes are also present in the RIGHT femoral head. dextroconvex curve of the thoracolumbar spine may be positional.  Lung Bases: Dependent atelectasis.  Liver: Fatty infiltration adjacent to the falciform ligament of the liver. Mild hepatomegaly with 18.5 cm liver span and rounding of the inferior RIGHT hepatic lobe.  Spleen: Infarction of the spleen is present involving the anterior and superior portions. This is probably acute or subacute.  Gallbladder:  Normal.  Common bile duct:  Normal.  Pancreas: Mild prominence of pancreatic duct. No obstructing lesion identified. Small lipoma is present in the anterior body of the pancreas.  Adrenal glands:  Normal bilaterally.  Kidneys: LEFT kidney appears normal. No calculi. The LEFT ureter is normal.  The RIGHT kidney demonstrates areas of scarring. In the interpolar region, there is an area of cortical thinning which is most compatible with scarring rather than acute infarct. There are no discrete wedge-shaped perfusion defects in the kidneys. The RIGHT ureter appears normal.  Stomach:  Distended with oral contrast.  No inflammatory changes.  Small bowel: No obstruction or evidence of intestinal ischemia. No mural thickening.  Colon: Normal appendix. Ascending colon appears normal. Transverse colon is normal. Descending and rectosigmoid colon at normal.  Pelvic Genitourinary: Physiologic appearance of the uterus. Retroverted uterus. Urinary bladder appears normal.  Peritoneum: Physiologic free fluid in the anatomic pelvis. No free air.  Vascular/lymphatic: There is a large exophytic thrombus in the descending thoracic aorta. This probably represents an embolic source of infarction of the spleen. The distal aorta appears normal. Mild iliofemoral atherosclerotic calcification.  Body Wall: Normal.  IMPRESSION: 1.  Large descending thoracic aortic thrombus extending from the superior margin of the scan to the aortic hiatus. This likely represents the source of embolic infarction to the spleen. No abdominal aortic dissection or aneurysm. 2. Acute or subacute anterior splenic infarct. 3. 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 4. No findings of acute intestinal ischemia. Mesenteric vasculature appears patent. 5. Mild hepatomegaly. Critical Value/emergent results were called by telephone at the time of interpretation on 01/15/2015 at 8:16 pm to  Dr. Marjean Donna , who verbally acknowledged these results. Vascular consultation was discussed during the phone call. Contrast-enhanced imaging of the chest should be considered to fully visualize the aortic thrombus which is only partially seen on this abdominal study.   Electronically Signed   By: Dereck Ligas M.D.   On: 01/15/2015 20:25   Ct Angio Chest Aorta W/cm &/or Wo/cm  01/15/2015   CLINICAL DATA:  Aortic thrombus seen on abdominal CT. Evaluate for thoracic origins/extension. Acute embolic splenic infarction.  EXAM: CT ANGIOGRAPHY CHEST WITH CONTRAST  TECHNIQUE: Multidetector CT imaging of the chest was performed using the standard protocol during bolus administration of intravenous contrast. Multiplanar CT image reconstructions and MIPs were obtained to evaluate the vascular anatomy.  CONTRAST:  49mL OMNIPAQUE IOHEXOL 350 MG/ML SOLN  COMPARISON:  None.  FINDINGS: Bones: Mild thoracic spondylosis.  No aggressive osseous lesion.  Cardiovascular: There is no aortic dissection. The ascending aorta and aortic arch appear normal. Three vessel aortic arch with normal opacification of the branch vessels. In the descending thoracic aorta, the previously seen mural thrombus is present. This becomes pedunculated at the level of the esophageal hiatus. The pedunculated intraluminal component measures 31 mm in length (image 86 series 8). Total length of the  thrombus is 10.7 cm, extending from the superior LEFT atrium to the level of the diaphragmatic crus. This arises from the anterior wall of the descending thoracic aorta. No underlying aortic injury is identified to account for thrombus formation. This raises the possibility of a hypercoagulable state.  Lungs: Paraseptal emphysema.  No airspace disease.  Central airways: Patent.  Effusions: None.  Lymphadenopathy: No axillary adenopathy. No mediastinal or hilar adenopathy.  Esophagus: Normal.  Other: None.  Review of the MIP images confirms the above findings.  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 No aortic dissection or underlying acute aortic abnormality to account for the thrombus formation.   Electronically Signed   By: Dereck Ligas M.D.   On: 01/15/2015 21:05     CBC  Recent Labs Lab 01/15/15 1614 01/16/15 0617  WBC 16.3* 13.4*  HGB 18.3* 16.5*  HCT 54.9* 49.9*  PLT 267 205  MCV 91.6 94.4  MCH 30.5 31.2  MCHC 33.3 33.1  RDW 14.3 14.4    Chemistries   Recent Labs Lab 01/15/15 1614 01/16/15 0617  NA 133* 138  K 3.4* 3.4*  CL 101 106  CO2 20* 22  GLUCOSE 136* 96  BUN <5* <5*  CREATININE 0.68 0.50  CALCIUM 9.2 8.1*  AST 36 16  ALT 12* 9*  ALKPHOS 85 70  BILITOT 0.5 0.7   ------------------------------------------------------------------------------------------------------------------ estimated creatinine clearance is 92.7 mL/min (by C-G formula based on Cr of 0.5). ------------------------------------------------------------------------------------------------------------------ No results for input(s): HGBA1C in the last 72 hours. ------------------------------------------------------------------------------------------------------------------ No results for input(s): CHOL, HDL, LDLCALC, TRIG, CHOLHDL, LDLDIRECT in the last 72  hours. ------------------------------------------------------------------------------------------------------------------ No results for input(s): TSH, T4TOTAL, T3FREE, THYROIDAB in the last 72 hours.  Invalid input(s): FREET3 ------------------------------------------------------------------------------------------------------------------ No results for input(s): VITAMINB12, FOLATE, FERRITIN, TIBC, IRON, RETICCTPCT in the last 72 hours.  Coagulation profile  Recent Labs Lab 01/15/15 1614  INR 1.08    No results for input(s): DDIMER in the last 72 hours.  Cardiac Enzymes No results for input(s): CKMB, TROPONINI, MYOGLOBIN in the last 168 hours.  Invalid input(s): CK ------------------------------------------------------------------------------------------------------------------ Invalid input(s): POCBNP    Assessment & Plan   39 year old Caucasian female without  significant past history presented with abdominal pain found to stomach and proximal/aortic thrombus  1. Descending aortic thrombus: Appreciated vascular consult possible hypercoagulable state I have checked factor V Leiden deficiency prothrombin gene mutation, ANA,  protein C and s panel, hematology consult will start Coumadin therapy pharmacy consult for Coumadin dosing 2. Splenic infarct: Pain control 3. Nicotine addiction smoking cessation provided for minutes spent strongly recommended patient stop smoking nicotine replacement therapy offered to the patient      Code Status Orders        Start     Ordered   01/15/15 2112  Full code   Continuous     01/15/15 2112           Consults  vascular   DVT Prophylaxis   Heparin    Lab Results  Component Value Date   PLT 205 01/16/2015     Time Spent in minutes   45 minutes Dustin Flock M.D on 01/16/2015 at 11:12 AM  Between 7am to 6pm - Pager - [phone]  After 6pm go to [email or web address] - password [email or web address] [place] [place] [facility]   Office   [phone]

## 2015-01-16 NOTE — Progress Notes (Signed)
Initial Nutrition Assessment   INTERVENTION:   Meals and Snacks: Cater to patient preferences, will send fresh fruit on all meal trays. Encouraged family to bring in food that pt enjoys to optimize nutritional intake.   NUTRITION DIAGNOSIS:   No nutrition diagnosis at this time  GOAL:   Patient will meet greater than or equal to 90% of their needs  MONITOR:    (Energy Intake, Digestive system)  REASON FOR ASSESSMENT:   Malnutrition Screening Tool    ASSESSMENT:   Pt admitted with 1 day h/o abdominal pain and n/v secondary to aortic thrombus with splenic infarct.   History reviewed. No pertinent past medical history.   Diet Order:  Diet regular Room service appropriate?: Yes; Fluid consistency:: Thin    Current Nutrition: Pt reports not eating lunch today as pt did not like food.  Food/Nutrition-Related History: Unable to clarify on visit today   Medications: NS at 55mL/hr, heparin infusion, coumadin  Electrolyte/Renal Profile and Glucose Profile:   Recent Labs Lab 01/15/15 1614 01/16/15 0617  NA 133* 138  K 3.4* 3.4*  CL 101 106  CO2 20* 22  BUN <5* <5*  CREATININE 0.68 0.50  CALCIUM 9.2 8.1*  GLUCOSE 136* 96   Protein Profile:  Recent Labs Lab 01/15/15 1614 01/16/15 0617  ALBUMIN 3.6 2.9*    Gastrointestinal Profile: Last BM:  01/15/2015   Weight Change: Unable to clarify. Per MST weight loss prior to admission.   Skin:  Reviewed, no issues  Height:   Ht Readings from Last 1 Encounters:  01/15/15 5\' 7"  (1.702 m)    Weight:   Wt Readings from Last 1 Encounters:  01/15/15 148 lb (67.132 kg)     BMI:  Body mass index is 23.17 kg/(m^2).   EDUCATION NEEDS:   No education needs identified at this time    [place], [place], LDN Pager [phone]

## 2015-01-16 NOTE — Consult Note (Signed)
ANTICOAGULATION CONSULT NOTE - FOLLOW UP   Pharmacy Consult for heparin Indication: DVT  No Known Allergies  Patient Measurements: Height: 5\' 7"  (170.2 cm) Weight: 148 lb (67.132 kg) IBW/kg (Calculated) : 61.6 Heparin Dosing Weight: 67.1kg  Vital Signs: Temp: 98.2 F (36.8 C) (09/27 0604) Temp Source: Oral (09/27 0604) BP: 129/66 mmHg (09/27 0604) Pulse Rate: 60 (09/27 0604)  Labs:  Recent Labs  01/15/15 1614 01/16/15 0617 01/16/15 1320  HGB 18.3* 16.5*  --   HCT 54.9* 49.9*  --   PLT 267 205  --   APTT 28  --   --   LABPROT 14.2  --   --   INR 1.08  --   --   HEPARINUNFRC  --  0.26* 0.31  CREATININE 0.68 0.50  --     Estimated Creatinine Clearance: 92.7 mL/min (by C-G formula based on Cr of 0.5).   Medical History: History reviewed. No pertinent past medical history.  Medications:  Scheduled:  . sodium chloride  3 mL Intravenous Q12H  . warfarin  10 mg Oral ONCE-1800  . Warfarin - Pharmacist Dosing Inpatient   Does not apply q1800    Assessment: Pt is a 39 year old female. Pharmacy consulted to dose heparin for a VTE. Pt does not take anticoagulants at home. Goal of Therapy:  Heparin level 0.3-0.7 units/ml Monitor platelets by anticoagulation protocol: Yes   Plan:  Give 4100 units bolus x 1 Start heparin infusion at 1150 units/hr Check anti-Xa level in 6 hours and daily while on heparin Continue to monitor H&H and platelets   0927 0617 heparin level subtherapeutic, increased to 1300 units/hr and will check level in 6 hours.  9/27: Heparin level resulted @ 0.31. Continue current rate. Will order another heparin level for 1930.   James,Teldrin D, Pharm.D. Clinical Pharmacist 01/16/2015,2:43 PM

## 2015-01-16 NOTE — Consult Note (Signed)
ANTICOAGULATION CONSULT NOTE - FOLLOW UP   Pharmacy Consult for heparin Indication: DVT  No Known Allergies  Patient Measurements: Height: 5\' 7"  (170.2 cm) Weight: 148 lb (67.132 kg) IBW/kg (Calculated) : 61.6 Heparin Dosing Weight: 67.1kg  Vital Signs:    Labs:  Recent Labs  01/15/15 1614 01/16/15 0617 01/16/15 1320 01/16/15 1906  HGB 18.3* 16.5*  --   --   HCT 54.9* 49.9*  --   --   PLT 267 205  --   --   APTT 28  --   --   --   LABPROT 14.2  --   --   --   INR 1.08  --   --   --   HEPARINUNFRC  --  0.26* 0.31 0.24*  CREATININE 0.68 0.50  --   --     Estimated Creatinine Clearance: 92.7 mL/min (by C-G formula based on Cr of 0.5).   Medical History: History reviewed. No pertinent past medical history.  Medications:  Scheduled:  . sodium chloride  3 mL Intravenous Q12H  . Warfarin - Pharmacist Dosing Inpatient   Does not apply q1800    Assessment: Pt is a 39 year old female. Pharmacy consulted to dose heparin for a VTE. Pt does not take anticoagulants at home. Goal of Therapy:  Heparin level 0.3-0.7 units/ml Monitor platelets by anticoagulation protocol: Yes   Plan:  Give 4100 units bolus x 1 Start heparin infusion at 1150 units/hr Check anti-Xa level in 6 hours and daily while on heparin Continue to monitor H&H and platelets   0927 0617 heparin level subtherapeutic, increased to 1300 units/hr and will check level in 6 hours.  9/27: Heparin level resulted @ 0.31. Continue current rate. Will order another heparin level for 1930.   9/27: heparin level 0.24-per nurse drip was not stopped at any point today. Will bolus 1000 units then increase rate to 1450 units/hr. Recheck level in 6 hours  Lisset Ketchem D Yehonatan Grandison, Pharm.D. Clinical Pharmacist 01/16/2015,8:30 PM

## 2015-01-16 NOTE — Consult Note (Signed)
ANTICOAGULATION CONSULT NOTE - Initial Consult  Pharmacy Consult for heparin Indication: DVT  No Known Allergies  Patient Measurements: Height: 5\' 7"  (170.2 cm) Weight: 148 lb (67.132 kg) IBW/kg (Calculated) : 61.6 Heparin Dosing Weight: 67.1kg  Vital Signs: Temp: 98.2 F (36.8 C) (09/27 0604) Temp Source: Oral (09/27 0604) BP: 129/66 mmHg (09/27 0604) Pulse Rate: 60 (09/27 0604)  Labs:  Recent Labs  01/15/15 1614 01/16/15 0617  HGB 18.3* 16.5*  HCT 54.9* 49.9*  PLT 267 205  APTT 28  --   LABPROT 14.2  --   INR 1.08  --   HEPARINUNFRC  --  0.26*  CREATININE 0.68  --     Estimated Creatinine Clearance: 92.7 mL/min (by C-G formula based on Cr of 0.68).   Medical History: History reviewed. No pertinent past medical history.  Medications:  Scheduled:  . sodium chloride  3 mL Intravenous Q12H    Assessment: Pt is a 39 year old female. Pharmacy consulted to dose heparin for a VTE. Pt does not take anticoagulants at home. Goal of Therapy:  Heparin level 0.3-0.7 units/ml Monitor platelets by anticoagulation protocol: Yes   Plan:  Give 4100 units bolus x 1 Start heparin infusion at 1150 units/hr Check anti-Xa level in 6 hours and daily while on heparin Continue to monitor H&H and platelets   0927 0617 heparin level subtherapeutic, increased to 1300 units/hr and will check level in 6 hours.  Laural Benes, Pharm.D. Clinical Pharmacist 01/16/2015,7:24 AM

## 2015-01-16 NOTE — Progress Notes (Signed)
Pt admitted from ED to Room 213 at approx 2215 . Received report from [facility] in ED. Per Gaspar Bidding pt received Heparin bolus in ED.  Pt transferred from ED to Room 213 with heparin drip at 11.5/hr. Pt A&O x4. IV access intact & infusing. Pt c/o abdominal pain specifically located above umbilicus. Abdomen tender and distended. On auscultation, pleaural rub breath sounds noted bilaterally. Pt states "I know, I have been told many times that I need to stop smoking but I just don't want to". Medications administered as ordered (See MAR). Admission & Assessment completed.   Pt ambulated to bathroom with assistance from nurse. Gait unsteady at this time due to pain medications. While in the bathroom, pt became tangled in IV tubing. Encouraged pt to continue to allow staff to assist her to the bathroom. Pt states "When I need to go to the bathroom I will just have to jump right up and get myself in there". Educated pt on importance of calling for assistance but she continues to refuse. Pt refused bed alarm as well.  Oriented pt to room. Education provided on call bell, telephone, IVpole/IV alarms. Education presented on white board as a pt/nurse Arboriculturist. White board completed and updated as needed.  Reviewed diet/medication orders with pt. Addressed all of the pt & family's questions.  VSS. Family at bedside and will continue to monitor.

## 2015-01-16 NOTE — Consult Note (Signed)
[facility] Vascular Consult Note  MRN : [id number]  Suzanne [name] is a 39 y.o. (September 22, 1975) female who presents with chief complaint of  Chief Complaint  Patient presents with  . Abdominal Pain  .  History of Present Illness: Patient is a 39 yo WF who presents with a couple day history of abdominal pain.  This is mid upper abdomen.  Not well relieved with rest or any other maneuvers.  No trauma or injury.  No previous abdominal issues.  Had 4 C-sections and has a previous history of miscarriages as well.  Pain is associated with nausea and poor appetite.  Better since admission to hospital.  It is a dull aching pain with occasional sharper pains. She underwent a CT scan of the chest and abdomen and pelvis which I have independently reviewed.  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 Penetrating ulcer, dissection, or aneurysm). 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    Current Facility-Administered Medications  Medication Dose Route Frequency Provider Last Rate Last Dose  . 0.9 %  sodium chloride infusion   Intravenous Continuous Lytle Butte, MD 75 mL/hr at 01/16/15 1055 1,000 mL at 01/16/15 1055  . acetaminophen (TYLENOL) tablet 650 mg  650 mg Oral Q6H PRN Lytle Butte, MD       Or  . acetaminophen (TYLENOL) suppository 650 mg  650 mg Rectal Q6H PRN Lytle Butte, MD      . heparin ADULT infusion 100 units/mL (25000 units/250 mL)  1,300 Units/hr Intravenous Continuous Lytle Butte, MD 13 mL/hr at 01/16/15 1524 1,300 Units/hr at 01/16/15 1524  . HYDROmorphone (DILAUDID) injection 1 mg  1 mg Intravenous Q4H PRN Dustin Flock, MD   1 mg at 01/16/15 1055  . nicotine (NICOTROL) 10 MG inhaler 1 continuous puffing  1 cartridge Inhalation PRN Gregor Hams, MD   1 cartridge at 01/15/15 2115  . ondansetron (ZOFRAN) tablet 4 mg  4 mg Oral Q6H PRN  Lytle Butte, MD       Or  . ondansetron [facility]) injection 4 mg  4 mg Intravenous Q6H PRN Lytle Butte, MD   4 mg at 01/16/15 1146  . oxyCODONE (Oxy IR/ROXICODONE) immediate release tablet 5 mg  5 mg Oral Q4H PRN Lytle Butte, MD   5 mg at 01/16/15 1722  . sodium chloride 0.9 % injection 3 mL  3 mL Intravenous Q12H Lytle Butte, MD   3 mL at 01/16/15 0916  . Warfarin - Pharmacist Dosing Inpatient   Does not apply [id number] Dustin Flock, MD        History reviewed. No pertinent past medical history.  History reviewed. No pertinent past surgical history.  Social History Social History  Substance Use Topics  . Smoking status: Current Some Day Smoker  . Smokeless tobacco: None  . Alcohol Use: Yes  single, has four children  Family History Family History  Problem Relation Age of Onset  . Adopted: Yes  she was adopted so this is not really known  No Known Allergies   REVIEW OF SYSTEMS (Negative unless checked)  Constitutional: [] Weight loss  [] Fever  [] Chills Cardiac: [] Chest pain   [] Chest pressure   [] Palpitations   [] Shortness of breath when laying flat   [] Shortness of breath at rest   [] Shortness of breath with exertion. Vascular:  [] Pain in legs with  walking   [] Pain in legs at rest   [] Pain in legs when laying flat   [] Claudication   [] Pain in feet when walking  [] Pain in feet at rest  [] Pain in feet when laying flat   [] History of DVT   [] Phlebitis   [] Swelling in legs   [] Varicose veins   [] Non-healing ulcers Pulmonary:   [] Uses home oxygen   [] Productive cough   [] Hemoptysis   [] Wheeze  [] COPD   [] Asthma Neurologic:  [] Dizziness  [] Blackouts   [] Seizures   [] History of stroke   [] History of TIA  [] Aphasia   [] Temporary blindness   [] Dysphagia   [] Weakness or numbness in arms   [] Weakness or numbness in legs Musculoskeletal:  [] Arthritis   [] Joint swelling   [] Joint pain   [] Low back pain Hematologic:  [] Easy bruising  [] Easy bleeding   [] Hypercoagulable state   [] Anemic   [] Hepatitis Gastrointestinal:  [] Blood in stool   [] Vomiting blood  [] Gastroesophageal reflux/heartburn   [] Difficulty swallowing. Genitourinary:  [] Chronic kidney disease   [] Difficult urination  [] Frequent urination  [] Burning with urination   [] Blood in urine Skin:  [] Rashes   [] Ulcers   [] Wounds Psychological:  [] History of anxiety   []  History of major depression.  Physical Examination  Filed Vitals:   01/15/15 2013 01/15/15 2148 01/15/15 2230 01/16/15 0604  BP: 167/88 166/82 148/97 129/66  Pulse:  86 60 60  Temp: 97.9 F (36.6 C)  98.1 F (36.7 C) 98.2 F (36.8 C)  TempSrc: Oral  Oral Oral  Resp:  18 16 16   Height:      Weight:      SpO2: 99% 98% 99% 98%   Body mass index is 23.17 kg/(m^2). Gen:  WD/WN, NAD Head: [place]/AT, No temporalis wasting. Prominent temp pulse not noted. Ear/Nose/Throat: Hearing grossly intact, nares w/o erythema or drainage, oropharynx w/o Erythema/Exudate Eyes: PERRLA, EOMI.  Neck: Supple, no nuchal rigidity.  No bruit or JVD.  Pulmonary:  Good air movement, clear to auscultation bilaterally.  Cardiac: RRR, normal S1, S2, no Murmurs, rubs or gallops. Vascular:  Vessel Right Left  Radial Palpable Palpable  Ulnar Palpable Palpable  Brachial Palpable Palpable  Carotid Palpable, without bruit Palpable, without bruit  Aorta Not palpable N/A  Femoral Palpable Palpable  Popliteal Palpable Palpable  PT Palpable Palpable  DP Palpable Palpable   Gastrointestinal: soft, tender in upper abdomen. No guarding/reflex. No masses, surgical incisions, or scars. Musculoskeletal: M/S 5/5 throughout.  Extremities without ischemic changes.  No deformity or atrophy. No edema. Neurologic: CN 2-12 intact. Pain and light touch intact in extremities.  Symmetrical.  Speech is fluent. Motor exam as listed above. Psychiatric: Judgment intact, Mood & affect appropriate for pt's clinical situation. Dermatologic: No rashes or ulcers noted.  No cellulitis or open  wounds. Lymph : No Cervical, Axillary, or Inguinal lymphadenopathy.      CBC Lab Results  Component Value Date   WBC 13.4* 01/16/2015   HGB 16.5* 01/16/2015   HCT 49.9* 01/16/2015   MCV 94.4 01/16/2015   PLT 205 01/16/2015    BMET    Component Value Date/Time   NA 138 01/16/2015 0617   NA 140 04/23/2011 1538   K 3.4* 01/16/2015 0617   K 3.9 04/23/2011 1538   CL 106 01/16/2015 0617   CL 105 04/23/2011 1538   CO2 22 01/16/2015 0617   CO2 25 04/23/2011 1538   GLUCOSE 96 01/16/2015 0617   GLUCOSE 117* 04/23/2011 1538   BUN <5* 01/16/2015 0617  BUN 2* 04/23/2011 1538   CREATININE 0.50 01/16/2015 0617   CREATININE 0.63 04/23/2011 1538   CALCIUM 8.1* 01/16/2015 0617   CALCIUM 8.9 04/23/2011 1538   GFRNONAA >60 01/16/2015 0617   GFRNONAA >60 04/23/2011 1538   GFRAA >60 01/16/2015 0617   GFRAA >60 04/23/2011 1538   Estimated Creatinine Clearance: 92.7 mL/min (by C-G formula based on Cr of 0.5).  COAG Lab Results  Component Value Date   INR 1.08 01/15/2015    Radiology Ct Abdomen Pelvis W Contrast  01/15/2015   CLINICAL DATA:  Midline abdominal pain. Vomiting. Periumbilical pain. Leukocytosis.  EXAM: CT ABDOMEN AND PELVIS WITH CONTRAST  TECHNIQUE: Multidetector CT imaging of the abdomen and pelvis was performed using the standard protocol following bolus administration of intravenous contrast.  CONTRAST:  154mL OMNIPAQUE IOHEXOL 300 MG/ML  SOLN  COMPARISON:  None.  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 Marginal osteophytes are also present in the RIGHT femoral head. dextroconvex curve of the thoracolumbar spine may be positional.  Lung Bases: Dependent atelectasis.  Liver: Fatty infiltration adjacent to the falciform ligament of the liver. Mild hepatomegaly with 18.5 cm liver span and rounding of the inferior RIGHT hepatic lobe.  Spleen: Infarction of the spleen is present involving  the anterior and superior portions. This is probably acute or subacute.  Gallbladder:  Normal.  Common bile duct:  Normal.  Pancreas: Mild prominence of pancreatic duct. No obstructing lesion identified. Small lipoma is present in the anterior body of the pancreas.  Adrenal glands:  Normal bilaterally.  Kidneys: LEFT kidney appears normal. No calculi. The LEFT ureter is normal.  The RIGHT kidney demonstrates areas of scarring. In the interpolar region, there is an area of cortical thinning which is most compatible with scarring rather than acute infarct. There are no discrete wedge-shaped perfusion defects in the kidneys. The RIGHT ureter appears normal.  Stomach:  Distended with oral contrast.  No inflammatory changes.  Small bowel: No obstruction or evidence of intestinal ischemia. No mural thickening.  Colon: Normal appendix. Ascending colon appears normal. Transverse colon is normal. Descending and rectosigmoid colon at normal.  Pelvic Genitourinary: Physiologic appearance of the uterus. Retroverted uterus. Urinary bladder appears normal.  Peritoneum: Physiologic free fluid in the anatomic pelvis. No free air.  Vascular/lymphatic: There is a large exophytic thrombus in the descending thoracic aorta. This probably represents an embolic source of infarction of the spleen. The distal aorta appears normal. Mild iliofemoral atherosclerotic calcification.  Body Wall: Normal.  IMPRESSION: 1. Large descending thoracic aortic thrombus extending from the superior margin of the scan to the aortic hiatus. This likely represents the source of embolic infarction to the spleen. No abdominal aortic dissection or aneurysm. 2. Acute or subacute anterior splenic infarct. 3. 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 4. No findings of acute intestinal ischemia. Mesenteric vasculature appears patent. 5. Mild hepatomegaly. Critical Value/emergent results were called by telephone at the time of interpretation on  01/15/2015 at 8:16 pm to Dr. Marjean Donna , who verbally acknowledged these results. Vascular consultation was discussed during the phone call. Contrast-enhanced imaging of the chest should be considered to fully visualize the aortic thrombus which is only partially seen on this abdominal study.   Electronically Signed   By: Dereck Ligas M.D.   On: 01/15/2015 20:25   Ct Angio Chest Aorta W/cm &/or Wo/cm  01/15/2015   CLINICAL DATA:  Aortic thrombus seen on abdominal  CT. Evaluate for thoracic origins/extension. Acute embolic splenic infarction.  EXAM: CT ANGIOGRAPHY CHEST WITH CONTRAST  TECHNIQUE: Multidetector CT imaging of the chest was performed using the standard protocol during bolus administration of intravenous contrast. Multiplanar CT image reconstructions and MIPs were obtained to evaluate the vascular anatomy.  CONTRAST:  71mL OMNIPAQUE IOHEXOL 350 MG/ML SOLN  COMPARISON:  None.  FINDINGS: Bones: Mild thoracic spondylosis.  No aggressive osseous lesion.  Cardiovascular: There is no aortic dissection. The ascending aorta and aortic arch appear normal. Three vessel aortic arch with normal opacification of the branch vessels. In the descending thoracic aorta, the previously seen mural thrombus is present. This becomes pedunculated at the level of the esophageal hiatus. The pedunculated intraluminal component measures 31 mm in length (image 86 series 8). Total length of the thrombus is 10.7 cm, extending from the superior LEFT atrium to the level of the diaphragmatic crus. This arises from the anterior wall of the descending thoracic aorta. No underlying aortic injury is identified to account for thrombus formation. This raises the possibility of a hypercoagulable state.  Lungs: Paraseptal emphysema.  No airspace disease.  Central airways: Patent.  Effusions: None.  Lymphadenopathy: No axillary adenopathy. No mediastinal or hilar adenopathy.  Esophagus: Normal.  Other: None.  Review of the MIP images  confirms the above findings.  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 No aortic dissection or underlying acute aortic abnormality to account for the thrombus formation.   Electronically Signed   By: Dereck Ligas M.D.   On: 01/15/2015 21:05    Assessment/Plan 1. Aortic thrombus of unclear etiology.  Present in descending thoracic aorta with no obvious aortic pathology as a cause.  Suspect a hypercoagulable state and agree with anticoagulation.  This is generally treated with anticoagulation and surgery or intervention would be reserved for any further embolic phenomenon.  Would repeat a CT scan at 48-72 hours to assess initial response to heparin 2. 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  This would be managed expectantly and no intervention is of benefit.  Small chance of splenic abscess so if fevers develop, would repeat CT scan. 3. Tobacco dependence.  Smoking cessation strongly recommended.  This may have increased her propensity to thrombosis and increases risk of atherosclerosis.     DEW,JASON, MD  01/16/2015 5:33 PM

## 2015-01-17 LAB — CBC
HEMATOCRIT: 45.4 % (ref 35.0–47.0)
Hemoglobin: 14.7 g/dL (ref 12.0–16.0)
MCH: 30.2 pg (ref 26.0–34.0)
MCHC: 32.3 g/dL (ref 32.0–36.0)
MCV: 93.4 fL (ref 80.0–100.0)
PLATELETS: 177 10*3/uL (ref 150–440)
RBC: 4.86 MIL/uL (ref 3.80–5.20)
RDW: 14.3 % (ref 11.5–14.5)
WBC: 11.7 10*3/uL — ABNORMAL HIGH (ref 3.6–11.0)

## 2015-01-17 LAB — HEPARIN LEVEL (UNFRACTIONATED)
Heparin Unfractionated: 0.38 IU/mL (ref 0.30–0.70)
Heparin Unfractionated: 0.46 IU/mL (ref 0.30–0.70)

## 2015-01-17 LAB — BASIC METABOLIC PANEL
Anion gap: 7 (ref 5–15)
BUN: 5 mg/dL — AB (ref 6–20)
CALCIUM: 7.7 mg/dL — AB (ref 8.9–10.3)
CO2: 24 mmol/L (ref 22–32)
CREATININE: 0.56 mg/dL (ref 0.44–1.00)
Chloride: 106 mmol/L (ref 101–111)
GFR calc Af Amer: >60 mL/min (ref >60)
GLUCOSE: 90 mg/dL (ref 65–99)
Potassium: 3.3 mmol/L — ABNORMAL LOW (ref 3.5–5.1)
Sodium: 137 mmol/L (ref 135–145)

## 2015-01-17 LAB — PROTIME-INR
INR: 1.26
Prothrombin Time: 16 seconds — ABNORMAL HIGH (ref 11.4–15.0)

## 2015-01-17 MED ORDER — WARFARIN SODIUM 5 MG PO TABS
5.0000 mg | ORAL_TABLET | Freq: Every day | ORAL | Status: AC
  Administered 2015-01-17: 5 mg via ORAL
  Filled 2015-01-17: qty 1

## 2015-01-17 MED ORDER — NICOTINE 21 MG/24HR TD PT24
21.0000 mg | MEDICATED_PATCH | Freq: Every day | TRANSDERMAL | Status: DC
  Administered 2015-01-17: 21 mg via TRANSDERMAL
  Filled 2015-01-17: qty 1

## 2015-01-17 NOTE — Consult Note (Signed)
ANTICOAGULATION CONSULT NOTE - FOLLOW UP   Pharmacy Consult for heparin Indication: DVT  No Known Allergies  Patient Measurements: Height: 5\' 7"  (170.2 cm) Weight: 148 lb (67.132 kg) IBW/kg (Calculated) : 61.6 Heparin Dosing Weight: 67.1kg  Vital Signs: Temp: 98.1 F (36.7 C) (09/27 2128) Temp Source: Oral (09/27 2128) BP: 112/59 mmHg (09/27 2128) Pulse Rate: 55 (09/27 2128)  Labs:  Recent Labs  01/15/15 1614  01/16/15 0617 01/16/15 1320 01/16/15 1906 01/17/15 0347  HGB 18.3*  --  16.5*  --   --  14.7  HCT 54.9*  --  49.9*  --   --  45.4  PLT 267  --  205  --   --  177  APTT 28  --   --   --   --   --   LABPROT 14.2  --   --   --   --  16.0*  INR 1.08  --   --   --   --  1.26  HEPARINUNFRC  --   < > 0.26* 0.31 0.24* 0.38  CREATININE 0.68  --  0.50  --   --  0.56  < > = values in this interval not displayed.  Estimated Creatinine Clearance: 92.7 mL/min (by C-G formula based on Cr of 0.56).   Medical History: History reviewed. No pertinent past medical history.  Medications:  Scheduled:  . sodium chloride  3 mL Intravenous Q12H  . Warfarin - Pharmacist Dosing Inpatient   Does not apply q1800    Assessment: Pt is a 39 year old female. Pharmacy consulted to dose heparin for a VTE. Pt does not take anticoagulants at home. Goal of Therapy:  Heparin level 0.3-0.7 units/ml Monitor platelets by anticoagulation protocol: Yes   Plan:  Give 4100 units bolus x 1 Start heparin infusion at 1150 units/hr Check anti-Xa level in 6 hours and daily while on heparin Continue to monitor H&H and platelets   0927 0617 heparin level subtherapeutic, increased to 1300 units/hr and will check level in 6 hours.  9/27: Heparin level resulted @ 0.31. Continue current rate. Will order another heparin level for 1930.   9/27: heparin level 0.24-per nurse drip was not stopped at any point today. Will bolus 1000 units then increase rate to 1450 units/hr. Recheck level in 6  hours  0928 0347 heparin level therapeutic. Will order confirmation level in 6 hours.  Laural Benes, Pharm.D. Clinical Pharmacist 01/17/2015,4:41 AM

## 2015-01-17 NOTE — Consult Note (Signed)
ANTICOAGULATION CONSULT NOTE - FOLLOW UP   Pharmacy Consult for heparin and coumadin dosing  Indication: DVT  No Known Allergies  Patient Measurements: Height: 5\' 7"  (170.2 cm) Weight: 148 lb (67.132 kg) IBW/kg (Calculated) : 61.6 Heparin Dosing Weight: 67.1kg  Vital Signs: Temp: 98.1 F (36.7 C) (09/28 0502) Temp Source: Oral (09/28 0502) BP: 132/68 mmHg (09/28 0502) Pulse Rate: 52 (09/28 0502)  Labs:  Recent Labs  01/15/15 1614 01/16/15 0617  01/16/15 1906 01/17/15 0347 01/17/15 1012  HGB 18.3* 16.5*  --   --  14.7  --   HCT 54.9* 49.9*  --   --  45.4  --   PLT 267 205  --   --  177  --   APTT 28  --   --   --   --   --   LABPROT 14.2  --   --   --  16.0*  --   INR 1.08  --   --   --  1.26  --   HEPARINUNFRC  --  0.26*  < > 0.24* 0.38 0.46  CREATININE 0.68 0.50  --   --  0.56  --   < > = values in this interval not displayed.  Estimated Creatinine Clearance: 92.7 mL/min (by C-G formula based on Cr of 0.56).   Medical History: History reviewed. No pertinent past medical history.  Medications:  Scheduled:  . nicotine  21 mg Transdermal Daily  . sodium chloride  3 mL Intravenous Q12H  . Warfarin - Pharmacist Dosing Inpatient   Does not apply q1800    Assessment: 39 yo female with DVT and aortic thrombus. Pharmacy consulted for heparin and warfarin dosing. Patient currently on heparin drip at 1450 unit per hour. Patient has received x1 dose of warfarin 10mg . INR today 1.26.   Goal of Therapy:  Heparin level 0.3-0.7 units/ml  INR:2-3 Monitor platelets by anticoagulation protocol: Yes   Plan:  0928 0347 heparin level therapeutic (0.38) 0928 1012 heparin level therapeutic (0.46)    Will continue heparin drip at 1450 u/hr now that we have two therapeutic levels.Will order heparin level for 0500 01/18/15 for monitoring.   Will order warfarin 5 mg X1 dose today @1800 . INR ordered for 0500.  Pharmacy will continue to monitor patients levels and make  adjustments as needed.   Nancy Fetter, PharmD Pharmacy Resident

## 2015-01-17 NOTE — Progress Notes (Signed)
[facility]  Daily Progress Note   Subjective  - * No surgery found *  Patient feeling much better. Able to tolerate diet without nausea or vomiting. Pain much improved. No new symptoms. Has tolerated anticoagulation without signs of bleeding  Objective Filed Vitals:   01/16/15 0604 01/16/15 2128 01/17/15 0502 01/17/15 1224  BP: 129/66 112/59 132/68 157/85  Pulse: 60 55 52 48  Temp: 98.2 F (36.8 C) 98.1 F (36.7 C) 98.1 F (36.7 C) 98.3 F (36.8 C)  TempSrc: Oral Oral Oral Oral  Resp: 16 17  18   Height:      Weight:      SpO2: 98% 98% 97% 98%    Intake/Output Summary (Last 24 hours) at 01/17/15 1422 Last data filed at 01/17/15 1233  Gross per 24 hour  Intake 1839.33 ml  Output   2225 ml  Net -385.67 ml    PULM  CTAB CV  RRR VASC  Palpable pulses distally  Laboratory CBC    Component Value Date/Time   WBC 11.7* 01/17/2015 0347   WBC 14.1* 04/23/2011 1538   HGB 14.7 01/17/2015 0347   HGB 17.1* 04/23/2011 1538   HCT 45.4 01/17/2015 0347   HCT 50.5* 04/23/2011 1538   PLT 177 01/17/2015 0347   PLT 419 04/23/2011 1538    BMET    Component Value Date/Time   NA 137 01/17/2015 0347   NA 140 04/23/2011 1538   K 3.3* 01/17/2015 0347   K 3.9 04/23/2011 1538   CL 106 01/17/2015 0347   CL 105 04/23/2011 1538   CO2 24 01/17/2015 0347   CO2 25 04/23/2011 1538   GLUCOSE 90 01/17/2015 0347   GLUCOSE 117* 04/23/2011 1538   BUN 5* 01/17/2015 0347   BUN 2* 04/23/2011 1538   CREATININE 0.56 01/17/2015 0347   CREATININE 0.63 04/23/2011 1538   CALCIUM 7.7* 01/17/2015 0347   CALCIUM 8.9 04/23/2011 1538   GFRNONAA >60 01/17/2015 0347   GFRNONAA >60 04/23/2011 1538   GFRAA >60 01/17/2015 0347   GFRAA >60 04/23/2011 1538    Assessment/Planning:    Aortic thrombus. No acute role for intervention. Continue anticoagulation. Repeat CT scan scheduled for tomorrow morning and if stable the patient can be discharged on anticoagulation at home  following this.  Splenic infarct. Stable. Would expect part of the spleen to necrosis with about half to one third of her spleen remaining functional based off her CT scan. We'll also get a better feel for remaining residual salvaged spleen on her repeat CT scan. No intervention required.  Tobacco dependence. Had a long talk about the importance of smoking cessation and why it would increase her risk of thrombosis in the future. She no she needs to quit.  Likely hypercoagulable state. Has been seen by hematology and they're going to follow as an outpatient as well.  Suzanne [name]  01/17/2015, 2:22 PM

## 2015-01-17 NOTE — Consult Note (Signed)
HEMATOLOGY CONSULTATION NOTE -   Reason for Consultation:  Aortic Thrombus with splenic infarct.  HISTORY OF PRESENT ILLNESS:  HISTORY OF PRESENT ILLNESS: Suzanne [name] is a 39 y.o. female without significant past medical history presenting with abdominal pain. She was admitted with one day duration of abdominal pain epigastric/left upper quadrant, nonradiating and no worsening or relieving factors. She also had nausea and vomiting. CT scan reveals descending aortic thrombus as well as splenic infarct. Currently states pain is much better since starting anticoagulation. Dr.Dew is following. She denies any prior h/o thromboembolism in the past. Family history unknown as she is adopted. Appetite is good, denies unintentional weight loss. Denies known h/o malignancy.  PAST MEDICAL HISTORY:  History reviewed. No pertinent past medical history.  PAST SURGICAL HISTORY:  History reviewed. No pertinent past surgical history.  SOCIAL HISTORY:   Social History  Substance Use Topics  . Smoking status: Current Some Day Smoker  . Smokeless tobacco: Not on file  . Alcohol Use: Yes    FAMILY HISTORY:   Family History  Problem Relation Age of Onset  . Adopted: Yes    DRUG ALLERGIES:  No Known Allergies  REVIEW OF SYSTEMS:  REVIEW OF SYSTEMS:  CONSTITUTIONAL: Denies fevers, chills, fatigue, weakness.  EYES: Denies blurred vision, double vision, or eye pain.  EARS, NOSE, THROAT: Denies tinnitus, ear pain, hearing loss.  RESPIRATORY: denies cough, shortness of breath, wheezing  CARDIOVASCULAR: Denies chest pain, palpitations, edema.  GASTROINTESTINAL:Positive nausea , vomiting, abdominal pain.  GENITOURINARY: Denies dysuria, hematuria.  ENDOCRINE: Denies nocturia or thyroid problems. HEMATOLOGIC AND LYMPHATIC: Denies easy bruising or bleeding.  SKIN: Denies rash or lesions.  MUSCULOSKELETAL: Denies new bone pains.  NEUROLOGIC: Denies  paralysis, paresthesias.   MEDICATIONS AT HOME:   Prior to Admission medications   Medication Sig Start Date End Date Taking? Authorizing Provider  ibuprofen (ADVIL,MOTRIN) 800 MG tablet Take 800 mg by mouth every 4 (four) hours as needed for mild pain.   Yes Historical Provider, MD    PHYSICAL EXAM:  Filed Vitals:   01/15/15 2148  BP: 166/82  Pulse: 86  Temp:   Resp: 18   CONSTITUTIONAL:   Patient is moderately built and nourished individual, alert and oriented, in no acute distress.  No icterus or pallor.   HEENT:   Normocephalic, atraumatic.  No oral thrush.    NECK:   Neck is supple.  No cervical adenopathy.  RESPIRATORY:   Clear to auscultation. No crepitations or rhonchi. CARDIAC: S1S2, regular rate and rhythm.   GI:   Abdomen is soft, mildly tender LUQ.  No hepatosplenomegaly clinically.   EXTREMITIES: No pedal edema or cyanosis.   LYMPHATICS: No palpable lymphadenopathy in the axillary or inguinal regions. SKIN:   No major bruising.  No rash. Refused breast exam.   NEUROLOGICAL:   Alert and oriented x 3. Grossly nonfocal. Cranial nerves are intact.  SKELETAL: No obvious swelling or redness in joints.   LABORATORY PANEL:   CBC  Last Labs      Recent Labs Lab 01/15/15 1614  WBC 16.3*  HGB 18.3*  HCT 54.9*  PLT 267     ------------------------------------------------------------------------------------------------------------------  Chemistries   Last Labs      Recent Labs Lab 01/15/15 1614  NA 133*  K 3.4*  CL 101  CO2 20*  GLUCOSE 136*  BUN <5*  CREATININE 0.68  CALCIUM 9.2  AST 36  ALT 12*  ALKPHOS 85  BILITOT 0.5      RADIOLOGY:  Imaging Results (Last 48 hours)    Ct Abdomen Pelvis W Contrast  01/15/2015 CLINICAL DATA: Midline abdominal pain. Vomiting. Periumbilical pain. Leukocytosis. EXAM: CT ABDOMEN AND PELVIS WITH CONTRAST TECHNIQUE: Multidetector CT imaging  of the abdomen and pelvis was performed using the standard protocol following bolus administration of intravenous contrast. CONTRAST: 172mL OMNIPAQUE IOHEXOL 300 MG/ML SOLN COMPARISON: None. 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 Marginal osteophytes are also present in the RIGHT femoral head. dextroconvex curve of the thoracolumbar spine may be positional. Lung Bases: Dependent atelectasis. Liver: Fatty infiltration adjacent to the falciform ligament of the liver. Mild hepatomegaly with 18.5 cm liver span and rounding of the inferior RIGHT hepatic lobe. Spleen: Infarction of the spleen is present involving the anterior and superior portions. This is probably acute or subacute. Gallbladder: Normal. Common bile duct: Normal. Pancreas: Mild prominence of pancreatic duct. No obstructing lesion identified. Small lipoma is present in the anterior body of the pancreas. Adrenal glands: Normal bilaterally. Kidneys: LEFT kidney appears normal. No calculi. The LEFT ureter is normal. The RIGHT kidney demonstrates areas of scarring. In the interpolar region, there is an area of cortical thinning which is most compatible with scarring rather than acute infarct. There are no discrete wedge-shaped perfusion defects in the kidneys. The RIGHT ureter appears normal. Stomach: Distended with oral contrast. No inflammatory changes. Small bowel: No obstruction or evidence of intestinal ischemia. No mural thickening. Colon: Normal appendix. Ascending colon appears normal. Transverse colon is normal. Descending and rectosigmoid colon at normal. Pelvic Genitourinary: Physiologic appearance of the uterus. Retroverted uterus. Urinary bladder appears normal. Peritoneum: Physiologic free fluid in the anatomic pelvis. No free air. Vascular/lymphatic: There is a large exophytic thrombus in the descending thoracic aorta. This  probably represents an embolic source of infarction of the spleen. The distal aorta appears normal. Mild iliofemoral atherosclerotic calcification. Body Wall: Normal. IMPRESSION: 1. Large descending thoracic aortic thrombus extending from the superior margin of the scan to the aortic hiatus. This likely represents the source of embolic infarction to the spleen. No abdominal aortic dissection or aneurysm. 2. Acute or subacute anterior splenic infarct. 3. 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 4. No findings of acute intestinal ischemia. Mesenteric vasculature appears patent. 5. Mild hepatomegaly. Critical Value/emergent results were called by telephone at the time of interpretation on 01/15/2015 at 8:16 pm to Dr. Marjean Donna , who verbally acknowledged these results. Vascular consultation was discussed during the phone call. Contrast-enhanced imaging of the chest should be considered to fully visualize the aortic thrombus which is only partially seen on this abdominal study. Electronically Signed By: Dereck Ligas M.D. On: 01/15/2015 20:25   Ct Angio Chest Aorta W/cm &/or Wo/cm  01/15/2015 CLINICAL DATA: Aortic thrombus seen on abdominal CT. Evaluate for thoracic origins/extension. Acute embolic splenic infarction. EXAM: CT ANGIOGRAPHY CHEST WITH CONTRAST TECHNIQUE: Multidetector CT imaging of the chest was performed using the standard protocol during bolus administration of intravenous contrast. Multiplanar CT image reconstructions and MIPs were obtained to evaluate the vascular anatomy. CONTRAST: 85mL OMNIPAQUE IOHEXOL 350 MG/ML SOLN COMPARISON: None. FINDINGS: Bones: Mild thoracic spondylosis. No aggressive osseous lesion. Cardiovascular: There is no aortic dissection. The ascending aorta and aortic arch appear normal. Three vessel aortic arch with normal opacification of the branch vessels. In the descending thoracic aorta, the previously seen mural thrombus is  present. This becomes pedunculated at the level of the esophageal hiatus. The pedunculated intraluminal component measures 31  mm in length (image 86 series 8). Total length of the thrombus is 10.7 cm, extending from the superior LEFT atrium to the level of the diaphragmatic crus. This arises from the anterior wall of the descending thoracic aorta. No underlying aortic injury is identified to account for thrombus formation. This raises the possibility of a hypercoagulable state. Lungs: Paraseptal emphysema. No airspace disease. Central airways: Patent. Effusions: None. Lymphadenopathy: No axillary adenopathy. No mediastinal or hilar adenopathy. Esophagus: Normal. Other: None. Review of the MIP images confirms the above findings. 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 No aortic dissection or underlying acute aortic abnormality to account for the thrombus formation. Electronically Signed By: Dereck Ligas M.D. On: 01/15/2015 21:05      IMPRESSION / RECOMMENDATIONS: 39 year old Caucasian female with known history of thromboembolic phenomena now admitted with abdominal pain. CT scan as described above reports Descending thoracic aortic thrombus arising from the anterior wall and becoming pedunculated around the level of the esophageal hiatus, no aortic dissection or underlying acute aortic abnormality to account for the thrombus formation, and splenic infarct. Have explained to patient that this is very unusual, and most of the time aortic thrombus occurs in the setting of aneurysm, dissection or atherosclerotic. Abdominal pain likely related to splenic infarct, patient today states that her pain is significantly improved after starting anticoagulation. 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 Will also send beta-2 glycoprotein, anticardiolipin antibodies, SPEP, quantitative immunoglobulins. Patient also states that she has had leukocytosis chronically, will request BCR-ABL study and peripheral blood flow cytometry although one possibility for chronic mild leukocytosis could be history of smoking. Patient otherwise does not have risk factors for thromboembolism other than smoking history, family history is not known since she is adopted, per discussion with hospitalist they will send for lipid panel also. Vascular surgery is following, continue anticoagulation and recommendations per Dr. Lucky Cowboy for aortic thrombus. Patient also had elevated hematocrit (erythrocytosis) of 54.9%, elevated hemoglobin of 18.3 on September 26 but repeat CBC on September 28 reports normal hemoglobin at 14.7 and normal hematocrit at 45.4%, WBC is slightly above range at 11.7. From hematology standpoint, if she is discharged soon, please make appointment for follow-up at [place] at 2 weeks to discuss results of hypercoagulable workup. She was advised to completely quit smoking, states that she does not want to take patches or medication and will think about it and try on her own. Have explained above details to patient, she is agreeable to this plan. Thank you for the referral, please feel free to contact me if any additional questions.

## 2015-01-17 NOTE — Progress Notes (Signed)
[facility] [place] NAME: Suzanne [name]    MR#:  [id number]  DATE OF BIRTH:  11/26/1975  SUBJECTIVE:  CHIEF COMPLAINT:   Chief Complaint  Patient presents with  . Abdominal Pain   Patient here with abdominal pain and noted to have a aortic thrombus. Pain has improved. No nausea, vomiting. Patient agitated and wants to go home.  REVIEW OF SYSTEMS:    Review of Systems  Constitutional: Negative for fever and chills.  HENT: Negative for congestion and tinnitus.   Eyes: Negative for blurred vision and double vision.  Respiratory: Negative for cough, shortness of breath and wheezing.   Cardiovascular: Negative for chest pain, orthopnea and PND.  Gastrointestinal: Negative for nausea, vomiting, abdominal pain and diarrhea.  Genitourinary: Negative for dysuria and hematuria.  Neurological: Negative for dizziness, sensory change and focal weakness.  All other systems reviewed and are negative.   Nutrition: Regular Tolerating Diet: Yes Tolerating PT: Ambulatory   DRUG ALLERGIES:  No Known Allergies  VITALS:  Blood pressure 132/68, pulse 52, temperature 98.1 F (36.7 C), temperature source Oral, resp. rate 17, height 5\' 7"  (1.702 m), weight 67.132 kg (148 lb), last menstrual period 01/01/2015, SpO2 97 %.  PHYSICAL EXAMINATION:   Physical Exam  GENERAL:  39 y.o.-year-old patient lying in the bed with no acute distress.  EYES: Pupils equal, round, reactive to light and accommodation. No scleral icterus. Extraocular muscles intact.  HEENT: Head atraumatic, normocephalic. Oropharynx and nasopharynx clear.  NECK:  Supple, no jugular venous distention. No thyroid enlargement, no tenderness.  LUNGS: Normal breath sounds bilaterally, no wheezing, rales, rhonchi. No use of accessory muscles of respiration.  CARDIOVASCULAR: S1, S2 normal. No murmurs, rubs, or gallops.  ABDOMEN: Soft, nontender, nondistended. Bowel sounds present. No  organomegaly or mass.  EXTREMITIES: No cyanosis, clubbing or edema b/l.    NEUROLOGIC: Cranial nerves II through XII are intact. No focal Motor or sensory deficits b/l.   PSYCHIATRIC: The patient is alert and oriented x 3.  Good affect.  SKIN: No obvious rash, lesion, or ulcer.    LABORATORY PANEL:   CBC  Recent Labs Lab 01/17/15 0347  WBC 11.7*  HGB 14.7  HCT 45.4  PLT 177   ------------------------------------------------------------------------------------------------------------------  Chemistries   Recent Labs Lab 01/16/15 0617 01/17/15 0347  NA 138 137  K 3.4* 3.3*  CL 106 106  CO2 22 24  GLUCOSE 96 90  BUN <5* 5*  CREATININE 0.50 0.56  CALCIUM 8.1* 7.7*  AST 16  --   ALT 9*  --   ALKPHOS 70  --   BILITOT 0.7  --    ------------------------------------------------------------------------------------------------------------------  Cardiac Enzymes No results for input(s): TROPONINI in the last 168 hours. ------------------------------------------------------------------------------------------------------------------  RADIOLOGY:  Ct Abdomen Pelvis W Contrast  01/15/2015   CLINICAL DATA:  Midline abdominal pain. Vomiting. Periumbilical pain. Leukocytosis.  EXAM: CT ABDOMEN AND PELVIS WITH CONTRAST  TECHNIQUE: Multidetector CT imaging of the abdomen and pelvis was performed using the standard protocol following bolus administration of intravenous contrast.  CONTRAST:  177mL OMNIPAQUE IOHEXOL 300 MG/ML  SOLN  COMPARISON:  None.  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 Marginal osteophytes are also present in the RIGHT femoral head. dextroconvex curve of the thoracolumbar spine may be positional.  Lung Bases: Dependent atelectasis.  Liver: Fatty infiltration adjacent to the falciform ligament of the liver. Mild hepatomegaly with 18.5 cm liver  span and rounding of the inferior RIGHT  hepatic lobe.  Spleen: Infarction of the spleen is present involving the anterior and superior portions. This is probably acute or subacute.  Gallbladder:  Normal.  Common bile duct:  Normal.  Pancreas: Mild prominence of pancreatic duct. No obstructing lesion identified. Small lipoma is present in the anterior body of the pancreas.  Adrenal glands:  Normal bilaterally.  Kidneys: LEFT kidney appears normal. No calculi. The LEFT ureter is normal.  The RIGHT kidney demonstrates areas of scarring. In the interpolar region, there is an area of cortical thinning which is most compatible with scarring rather than acute infarct. There are no discrete wedge-shaped perfusion defects in the kidneys. The RIGHT ureter appears normal.  Stomach:  Distended with oral contrast.  No inflammatory changes.  Small bowel: No obstruction or evidence of intestinal ischemia. No mural thickening.  Colon: Normal appendix. Ascending colon appears normal. Transverse colon is normal. Descending and rectosigmoid colon at normal.  Pelvic Genitourinary: Physiologic appearance of the uterus. Retroverted uterus. Urinary bladder appears normal.  Peritoneum: Physiologic free fluid in the anatomic pelvis. No free air.  Vascular/lymphatic: There is a large exophytic thrombus in the descending thoracic aorta. This probably represents an embolic source of infarction of the spleen. The distal aorta appears normal. Mild iliofemoral atherosclerotic calcification.  Body Wall: Normal.  IMPRESSION: 1. Large descending thoracic aortic thrombus extending from the superior margin of the scan to the aortic hiatus. This likely represents the source of embolic infarction to the spleen. No abdominal aortic dissection or aneurysm. 2. Acute or subacute anterior splenic infarct. 3. 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 4. No findings of acute intestinal ischemia. Mesenteric vasculature appears patent. 5. Mild hepatomegaly. Critical Value/emergent  results were called by telephone at the time of interpretation on 01/15/2015 at 8:16 pm to Dr. Marjean Donna , who verbally acknowledged these results. Vascular consultation was discussed during the phone call. Contrast-enhanced imaging of the chest should be considered to fully visualize the aortic thrombus which is only partially seen on this abdominal study.   Electronically Signed   By: Dereck Ligas M.D.   On: 01/15/2015 20:25   Ct Angio Chest Aorta W/cm &/or Wo/cm  01/15/2015   CLINICAL DATA:  Aortic thrombus seen on abdominal CT. Evaluate for thoracic origins/extension. Acute embolic splenic infarction.  EXAM: CT ANGIOGRAPHY CHEST WITH CONTRAST  TECHNIQUE: Multidetector CT imaging of the chest was performed using the standard protocol during bolus administration of intravenous contrast. Multiplanar CT image reconstructions and MIPs were obtained to evaluate the vascular anatomy.  CONTRAST:  38mL OMNIPAQUE IOHEXOL 350 MG/ML SOLN  COMPARISON:  None.  FINDINGS: Bones: Mild thoracic spondylosis.  No aggressive osseous lesion.  Cardiovascular: There is no aortic dissection. The ascending aorta and aortic arch appear normal. Three vessel aortic arch with normal opacification of the branch vessels. In the descending thoracic aorta, the previously seen mural thrombus is present. This becomes pedunculated at the level of the esophageal hiatus. The pedunculated intraluminal component measures 31 mm in length (image 86 series 8). Total length of the thrombus is 10.7 cm, extending from the superior LEFT atrium to the level of the diaphragmatic crus. This arises from the anterior wall of the descending thoracic aorta. No underlying aortic injury is identified to account for thrombus formation. This raises the possibility of a hypercoagulable state.  Lungs: Paraseptal emphysema.  No airspace disease.  Central airways: Patent.  Effusions: None.  Lymphadenopathy: No axillary adenopathy. No mediastinal  or hilar  adenopathy.  Esophagus: Normal.  Other: None.  Review of the MIP images confirms the above findings.  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 No aortic dissection or underlying acute aortic abnormality to account for the thrombus formation.   Electronically Signed   By: Dereck Ligas M.D.   On: 01/15/2015 21:05     ASSESSMENT AND PLAN:   39 year old female with no significant past medical history who presented to the hospital due to abdominal pain and was noted to have an aortic thrombus along with a splenic infarct.  #1 aortic thrombus-likely the cause of patient's abdominal pain.  The exact cause of the aortic thrombus is unclear. 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 We'll repeat CT scan of the abdomen and pelvis tomorrow for further follow-up. -Also seen by hematology/oncology and plan to continue anticoagulation until thrombophilia workup is back. -Continue heparin, warfarin. Patient likely will need to be discharged on Lovenox and Coumadin and will needed indefinitely.  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 -Continue supportive care.  #3 tobacco abuse-Place on nicotine patch.  Discussed plan of care with the patient, Dr. dew, Dr. Ma Hillock  All the records are reviewed and case discussed with Care Management/Social Workerr. Management plans discussed with the patient, family and they are in agreement.  CODE STATUS:  Full  DVT Prophylaxis:  Heparin drip  TOTAL TIME TAKING CARE OF THIS PATIENT:  40 minutes.   POSSIBLE D/C IN  1-2 DAYS, DEPENDING ON CLINICAL CONDITION.  Greater than 50% of time spent in coordination of care and discussion with the patient and consultants   Henreitta Leber M.D on 01/17/2015 at 10:13 AM  Between 7am to 6pm - Pager -  [phone]  After 6pm go to [email or web address] - password [email or web address]  [facility]  Office  [phone]  CC: Primary care physician; No PCP Per Patient

## 2015-01-17 NOTE — Plan of Care (Signed)
Problem: Food- and Nutrition-Related Knowledge Deficit (NB-1.1) Goal: Nutrition education Formal process to instruct or train a patient/client in a skill or to impart knowledge to help patients/clients voluntarily manage or modify food choices and eating behavior to maintain or improve health. Outcome: Completed/Met Date Met:  01/17/15 Dietitian Consult for Vitamin K and Coumadin Education received.   Provided pt with "Vitamin K and Medications" handout from the Academy of [employer]etics. Reviewed food sources of Vitamin K and reasoning behind necessary diet modifications.   Pt stressed/anxious on visit today but was receptive to education. Expect good ahearence. Full nutrition assessment already completed on pt  [place], RD, LDN [phone] Pager

## 2015-01-18 ENCOUNTER — Encounter: Payer: Self-pay | Admitting: Radiology

## 2015-01-18 ENCOUNTER — Inpatient Hospital Stay: Payer: Medicaid Other

## 2015-01-18 LAB — CBC
HEMATOCRIT: 45.9 % (ref 35.0–47.0)
Hemoglobin: 15.4 g/dL (ref 12.0–16.0)
MCH: 31.4 pg (ref 26.0–34.0)
MCHC: 33.6 g/dL (ref 32.0–36.0)
MCV: 93.5 fL (ref 80.0–100.0)
Platelets: 178 10*3/uL (ref 150–440)
RBC: 4.91 MIL/uL (ref 3.80–5.20)
RDW: 14.4 % (ref 11.5–14.5)
WBC: 12 10*3/uL — ABNORMAL HIGH (ref 3.6–11.0)

## 2015-01-18 LAB — PROTIME-INR
INR: 1.84
Prothrombin Time: 21.4 seconds — ABNORMAL HIGH (ref 11.4–15.0)

## 2015-01-18 LAB — HEPARIN LEVEL (UNFRACTIONATED): Heparin Unfractionated: 0.39 IU/mL (ref 0.30–0.70)

## 2015-01-18 MED ORDER — ENOXAPARIN SODIUM 80 MG/0.8ML ~~LOC~~ SOLN
65.0000 mg | Freq: Once | SUBCUTANEOUS | Status: AC
  Administered 2015-01-18: 65 mg via SUBCUTANEOUS
  Filled 2015-01-18: qty 0.8

## 2015-01-18 MED ORDER — OXYCODONE HCL 5 MG PO TABS: 5.0000 mg | ORAL_TABLET | ORAL | Status: DC | PRN

## 2015-01-18 MED ORDER — ENOXAPARIN SODIUM 40 MG/0.4ML ~~LOC~~ SOLN: 65.0000 mg | Freq: Two times a day (BID) | SUBCUTANEOUS | Status: DC

## 2015-01-18 MED ORDER — WARFARIN SODIUM 5 MG PO TABS: 5.0000 mg | ORAL_TABLET | Freq: Once | ORAL | Status: DC

## 2015-01-18 MED ORDER — IOHEXOL 350 MG/ML SOLN
100.0000 mL | Freq: Once | INTRAVENOUS | Status: AC | PRN
  Administered 2015-01-18: 100 mL via INTRAVENOUS

## 2015-01-18 MED ORDER — WARFARIN SODIUM 3 MG PO TABS: 3.0000 mg | ORAL_TABLET | Freq: Every day | ORAL | Status: DC

## 2015-01-18 NOTE — Consult Note (Signed)
ANTICOAGULATION CONSULT NOTE - FOLLOW UP   Pharmacy Consult for heparin and coumadin dosing  Indication: DVT  No Known Allergies  Patient Measurements: Height: 5\' 7"  (170.2 cm) Weight: 148 lb (67.132 kg) IBW/kg (Calculated) : 61.6 Heparin Dosing Weight: 67.1kg  Vital Signs: Temp: 98.2 F (36.8 C) (09/29 0502) Temp Source: Oral (09/29 0502) BP: 141/84 mmHg (09/29 0502) Pulse Rate: 54 (09/29 0502)  Labs:  Recent Labs  01/15/15 1614 01/16/15 0617  01/17/15 0347 01/17/15 1012 01/18/15 0451  HGB 18.3* 16.5*  --  14.7  --  15.4  HCT 54.9* 49.9*  --  45.4  --  45.9  PLT 267 205  --  177  --  178  APTT 28  --   --   --   --   --   LABPROT 14.2  --   --  16.0*  --  21.4*  INR 1.08  --   --  1.26  --  1.84  HEPARINUNFRC  --  0.26*  < > 0.38 0.46 0.39  CREATININE 0.68 0.50  --  0.56  --   --   < > = values in this interval not displayed.  Estimated Creatinine Clearance: 92.7 mL/min (by C-G formula based on Cr of 0.56).   Medical History: History reviewed. No pertinent past medical history.  Medications:  Scheduled:  . nicotine  21 mg Transdermal Daily  . sodium chloride  3 mL Intravenous Q12H  . Warfarin - Pharmacist Dosing Inpatient   Does not apply q1800    Assessment: 39 yo female with DVT and aortic thrombus. Pharmacy consulted for heparin and warfarin dosing. Patient currently on heparin drip at 1450 unit per hour. Patient has received x1 dose of warfarin 10mg , followed by a 5mg  dose on 9/28. . INR today 1.84 (increase from 1.26). Heparin level remain therapeutic at 0.39.  Goal of Therapy:  Heparin level 0.3-0.7 units/ml  INR:2-3 Monitor platelets by anticoagulation protocol: Yes   Plan:  Will continue heparin drip at 1450 u/hr until INR therapeutic. Will continue labs daily @ 0500 for heparin level  monitoring.   INR is rising steadily, will order warfarin 5 mg again X1 dose today @1800 . INR ordered for 0500.  Pharmacy will continue to monitor patients  levels and make adjustments as needed.   Nancy Fetter, PharmD Pharmacy Resident

## 2015-01-18 NOTE — Discharge Summary (Signed)
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 No pertinent past medical history.  HOSPITAL COURSE:  39 year old female with no significant past medical history who presented to the hospital due to abdominal pain and was noted to have an aortic thrombus along with a splenic infarct.  #1 aortic thrombus-likely the cause of patient's abdominal pain. The exact cause of the aortic thrombus is unclear. 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 She was also shortly thereafter started on oral Coumadin. -A follow-up CT of the abdomen and pelvis with contrast was done which showed no change in her thrombus or any further distal infarcts. -Patient's INR on the day of discharge is 1.8. She clinically feels better inserted therefore being discharged on oral Coumadin and also subcutaneous Lovenox. She will have a PT/INR checked in the next 1-2 days. -Patient will continue follow-up with vascular surgery Dr. [name] and also with hematology oncology with Dr. 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 -Patient's clinical symptoms haven't improved. Repeat CT scan of the abdomen and pelvis with discharge showed evolution of this and no further acute changes  #3 tobacco abuse-on the hospital patient was placed on nicotine patch and was strongly advised to quit smoking.   DISCHARGE CONDITIONS:    Stable  CONSULTS OBTAINED:  Treatment Team:  Leia Alf, MD Algernon Huxley, MD  DRUG ALLERGIES:  No Known Allergies  DISCHARGE MEDICATIONS:   Discharge Medication List as of 01/18/2015  1:04 PM    START taking these medications   Details  enoxaparin (LOVENOX) 40 MG/0.4ML injection Inject 0.65 mLs (65 mg total) into the skin every 12 (twelve) hours., Starting 01/18/2015, Until Fri 01/19/15, Print    warfarin (COUMADIN) 3 MG tablet Take 1 tablet (3 mg total) by mouth daily at 6 PM., Starting 01/18/2015, Until Discontinued, Print      STOP taking these medications     ibuprofen (ADVIL,MOTRIN) 800 MG tablet          DISCHARGE INSTRUCTIONS:   DIET:  Regular diet  DISCHARGE CONDITION:  Stable  ACTIVITY:  Activity as tolerated  OXYGEN:  Home Oxygen: No.   Oxygen Delivery: room air  DISCHARGE LOCATION:  home   If you experience worsening of your admission symptoms, develop shortness of breath, life threatening emergency, suicidal or homicidal thoughts you must seek medical attention immediately by calling 911 or calling your MD immediately  if symptoms less severe.  You Must read complete instructions/literature along with all the possible adverse reactions/side effects for all the Medicines you take and that have been prescribed to you. Take any new Medicines after you have completely understood and accpet all the possible adverse reactions/side effects.   Please note  You were cared for by a hospitalist during your hospital stay. If you have any questions about your discharge medications or the care you received while you were in the hospital after  you are discharged, you can call the unit and asked to speak with the hospitalist on call if the hospitalist that took care of you is not available. Once you are discharged, your primary care physician will handle any further medical issues. Please note that NO REFILLS for any discharge medications will be authorized once  you are discharged, as it is imperative that you return to your primary care physician (or establish a relationship with a primary care physician if you do not have one) for your aftercare needs so that they can reassess your need for medications and monitor your lab values.     Today    No Further abdominal pain, nausea, vomiting. Clinically feels better. No evidence of acute bleeding.  VITAL SIGNS:  Blood pressure 141/84, pulse 54, temperature 98.2 F (36.8 C), temperature source Oral, resp. rate 18, height 5\' 7"  (1.702 m), weight 67.132 kg (148 lb), last menstrual period 01/01/2015, SpO2 99 %.  I/O:   Intake/Output Summary (Last 24 hours) at 01/18/15 1621 Last data filed at 01/18/15 1046  Gross per 24 hour  Intake   2292 ml  Output   2300 ml  Net     -8 ml    PHYSICAL EXAMINATION:  GENERAL:  39 y.o.-year-old patient lying in the bed with no acute distress.  EYES: Pupils equal, round, reactive to light and accommodation. No scleral icterus. Extraocular muscles intact.  HEENT: Head atraumatic, normocephalic. Oropharynx and nasopharynx clear.  NECK:  Supple, no jugular venous distention. No thyroid enlargement, no tenderness.  LUNGS: Normal breath sounds bilaterally, no wheezing, rales,rhonchi. No use of accessory muscles of respiration.  CARDIOVASCULAR: S1, S2 normal. No murmurs, rubs, or gallops.  ABDOMEN: Soft, non-tender, non-distended. Bowel sounds present. No organomegaly or mass.  EXTREMITIES: No pedal edema, cyanosis, or clubbing.  NEUROLOGIC: Cranial nerves II through XII are intact. No focal motor or sensory defecits b/l.  PSYCHIATRIC: The patient is alert and oriented x 3. Good affect.  SKIN: No obvious rash, lesion, or ulcer.   DATA REVIEW:   CBC  Recent Labs Lab 01/18/15 0451  WBC 12.0*  HGB 15.4  HCT 45.9  PLT 178    Chemistries   Recent Labs Lab 01/16/15 0617 01/17/15 0347  NA 138 137  K 3.4* 3.3*  CL 106 106  CO2 22 24  GLUCOSE 96 90  BUN  <5* 5*  CREATININE 0.50 0.56  CALCIUM 8.1* 7.7*  AST 16  --   ALT 9*  --   ALKPHOS 70  --   BILITOT 0.7  --     Cardiac Enzymes No results for input(s): TROPONINI in the last 168 hours.  Microbiology Results  No results found for this or any previous visit.  RADIOLOGY:  Ct Angio Abd/pel W/ And/or W/o  01/18/2015   CLINICAL DATA:  39 year old female with aortic thrombus currently on anticoagulation.  EXAM: CTA ABDOMEN AND PELVIS wITHOUT AND WITH CONTRAST  TECHNIQUE: Multidetector CT imaging of the abdomen and pelvis was performed using the standard protocol during bolus administration of intravenous contrast. Multiplanar reconstructed images and MIPs were obtained and reviewed to evaluate the vascular anatomy.  CONTRAST:  192mL OMNIPAQUE IOHEXOL 350 MG/ML SOLN  COMPARISON:  CT chest, abdomen and pelvis 01/15/2015 and  FINDINGS: VASCULAR  Aorta: Persistent broad-based but pedunculated thrombus within the descending thoracic aorta. There has been no interval change in the size or configuration of the thrombus compared to 01/15/2015. The thrombus is low in attenuation and not enhancing.  Celiac:  Widely patent. The left hepatic artery is replaced to the left gastric artery.  SMA: Widely patent.  Renals: Single renal arteries bilaterally which are widely patent.  IMA: Widely patent.  Inflow: Widely patent and unremarkable.  Proximal Outflow: Widely patent and unremarkable  Veins: No focal venous abnormality.  NON-VASCULAR  Lower Chest: The lung bases are clear. Visualized cardiac structures are within normal limits for size. No pericardial effusion. Unremarkable visualized distal thoracic esophagus.  Abdomen: Unremarkable CT appearance of the stomach, duodenum, adrenal glands and pancreas. Normal hepatic contour morphology. No discrete hepatic lesion. Geographic hypoattenuation in the left hemi-liver adjacent to the fissure for the falciform ligament is nonspecific but most suggestive of benign focal  fatty infiltration. Gallbladder is unremarkable. No intra or extrahepatic biliary ductal dilatation.  Expected evolution of splenic infarcts with persistent devascularization in the upper pole and anterior aspect of the spleen. Unremarkable appearance of the bilateral kidneys. No focal solid lesion, hydronephrosis or nephrolithiasis. No evidence of obstruction or focal bowel wall thickening. Normal appendix in the right lower quadrant. The terminal ileum is unremarkable. No free fluid or suspicious adenopathy.  Pelvis: Unremarkable bladder, uterus and adnexa. No suspicious adenopathy.  Bones/Soft Tissues: No acute fracture or aggressive appearing lytic or blastic osseous lesion.  Review of the MIP images confirms the above findings.  IMPRESSION: VASCULAR  1. There has been no interval change in the size, appearance or configuration of the broad based but pedunculated bland thrombus arising from the anterior aspect of the descending thoracic aorta. 2. No new arterial abnormality. 3. Variant hepatic arterial anatomy noted incidentally. NON VASCULAR  1. Expected evolution of superior and anterior splenic infarcts. No evidence of new infarct or sequelae of embolic phenomenon. 2. Probable mild focal fatty infiltration along the falciform ligament of the liver. Signed,  Criselda Peaches, MD  [facility]  [facility]   Electronically Signed   By: Jacqulynn Cadet M.D.   On: 01/18/2015 09:37      Management plans discussed with the patient, family and they are in agreement.  CODE STATUS:     Code Status Orders        Start     Ordered   01/15/15 2112  Full code   Continuous     01/15/15 2112      TOTAL TIME TAKING CARE OF THIS PATIENT: 45 minutes.    Henreitta Leber M.D on 01/18/2015 at 4:21 PM  Between 7am to 6pm - Pager - [phone]  After 6pm go to [email or web address] - password [email or web address] [facility]  [facility]  Office   [phone]  CC: Primary care physician; No PCP Per Patient

## 2015-01-18 NOTE — Discharge Instructions (Signed)
Please make sure you follow up for lab work in order to check INR.  You will be going home with one dose of lovenox to be given my patient tonight.  You are also starting an oral medication of coumadin tonight.  Please take coumadin as directed.  If you notice that you are bruising easily or that your gums are bleeding when you brush your teeth please stop taking the coumadin.  Please call your primary care Dr. With any questions or concerns

## 2015-01-19 ENCOUNTER — Encounter: Payer: Self-pay | Admitting: Internal Medicine

## 2015-01-19 ENCOUNTER — Inpatient Hospital Stay: Payer: Medicaid Other

## 2015-01-19 ENCOUNTER — Other Ambulatory Visit: Payer: Self-pay | Admitting: *Deleted

## 2015-01-19 ENCOUNTER — Inpatient Hospital Stay: Payer: Medicaid Other | Attending: Internal Medicine | Admitting: Internal Medicine

## 2015-01-19 VITALS — BP 171/90 | HR 64 | Temp 97.6°F | Resp 18 | Ht 67.0 in | Wt 145.3 lb

## 2015-01-19 DIAGNOSIS — Z7901 Long term (current) use of anticoagulants: Secondary | ICD-10-CM | POA: Insufficient documentation

## 2015-01-19 DIAGNOSIS — R1012 Left upper quadrant pain: Secondary | ICD-10-CM | POA: Diagnosis not present

## 2015-01-19 DIAGNOSIS — R590 Localized enlarged lymph nodes: Secondary | ICD-10-CM | POA: Diagnosis not present

## 2015-01-19 DIAGNOSIS — F17210 Nicotine dependence, cigarettes, uncomplicated: Secondary | ICD-10-CM | POA: Diagnosis not present

## 2015-01-19 DIAGNOSIS — N13729 Vesicoureteral-reflux with reflux nephropathy without hydroureter, unspecified: Secondary | ICD-10-CM | POA: Diagnosis not present

## 2015-01-19 DIAGNOSIS — I7411 Embolism and thrombosis of thoracic aorta: Secondary | ICD-10-CM | POA: Insufficient documentation

## 2015-01-19 DIAGNOSIS — I7410 Embolism and thrombosis of unspecified parts of aorta: Secondary | ICD-10-CM

## 2015-01-19 DIAGNOSIS — M1611 Unilateral primary osteoarthritis, right hip: Secondary | ICD-10-CM | POA: Insufficient documentation

## 2015-01-19 DIAGNOSIS — N854 Malposition of uterus: Secondary | ICD-10-CM | POA: Diagnosis not present

## 2015-01-19 DIAGNOSIS — D72829 Elevated white blood cell count, unspecified: Secondary | ICD-10-CM

## 2015-01-19 DIAGNOSIS — D735 Infarction of spleen: Secondary | ICD-10-CM | POA: Insufficient documentation

## 2015-01-19 DIAGNOSIS — Z79899 Other long term (current) drug therapy: Secondary | ICD-10-CM | POA: Insufficient documentation

## 2015-01-19 DIAGNOSIS — R160 Hepatomegaly, not elsewhere classified: Secondary | ICD-10-CM | POA: Diagnosis not present

## 2015-01-19 LAB — BETA-2-GLYCOPROTEIN I ABS, IGG/M/A: Beta-2 Glyco I IgG: <9 GPI IgG units (ref 0–20)

## 2015-01-19 LAB — ANA COMPREHENSIVE PANEL
Anti JO-1: <0.2 AI (ref 0.0–0.9)
Chromatin Ab SerPl-aCnc: <0.2 AI (ref 0.0–0.9)
ENA SM Ab Ser-aCnc: <0.2 AI (ref 0.0–0.9)
SSA (Ro) (ENA) Antibody, IgG: <0.2 AI (ref 0.0–0.9)
SSB (La) (ENA) Antibody, IgG: <0.2 AI (ref 0.0–0.9)
Scleroderma (Scl-70) (ENA) Antibody, IgG: <0.2 AI (ref 0.0–0.9)

## 2015-01-19 LAB — CARDIOLIPIN ANTIBODIES, IGG, IGM, IGA
Anticardiolipin IgG: <9 GPL U/mL (ref 0–14)
Anticardiolipin IgM: <9 MPL U/mL (ref 0–12)

## 2015-01-19 LAB — PROTEIN S PANEL
PROTEIN S ACTIVITY: 75 % (ref 60–145)
PROTEIN S AG FREE: 98 % (ref 56–124)
Protein S Ag, Total: 99 % (ref 58–150)

## 2015-01-19 LAB — FACTOR 5 LEIDEN

## 2015-01-19 LAB — PROTEIN C DEFICIENCY PROFILE
Protein C Activity: 88 % (ref 74–151)
Protein C, Total: 57 % — ABNORMAL LOW (ref 70–140)

## 2015-01-19 LAB — PTT-LA MIX: PTT-LA MIX: 53.1 s — AB (ref 0.0–50.0)

## 2015-01-19 LAB — LUPUS ANTICOAGULANT PANEL
DRVVT: 33.6 s (ref 0.0–55.1)
PTT LA: 58.9 s — AB (ref 0.0–50.0)

## 2015-01-19 LAB — PROTIME-INR
INR: 2.05
Prothrombin Time: 23.3 seconds — ABNORMAL HIGH (ref 11.4–15.0)

## 2015-01-19 LAB — HEXAGONAL PHASE PHOSPHOLIPID: Hexagonal Phase Phospholipid: 0 s (ref 0.0–8.0)

## 2015-01-19 LAB — PROTHROMBIN GENE MUTATION

## 2015-01-19 MED ORDER — OXYCODONE HCL 5 MG PO TABS: 5.0000 mg | ORAL_TABLET | ORAL | Status: DC | PRN

## 2015-01-19 NOTE — Progress Notes (Signed)
[place]  [phone] [phone]     ID: [name] OB: May 31, 1975  MR#: [id number]  LAG#:[id number]  Patient Care Team: No Pcp Per Patient as PCP - General (General Practice)  CHIEF COMPLAINT/DIAGNOSIS:  Recently diagnosed aortic thrombus, splenic infarct during hospitalization for left upper quadrant pain on 01/15/15.  01/15/15 - CT scan of the abdomen/pelvis. IMPRESSION: 1. Large descending thoracic aortic thrombus extending from the superior margin of the scan to the aortic hiatus. This likely represents the source of embolic infarction to the spleen. No abdominal aortic dissection or aneurysm. 2. Acute or subacute anterior splenic infarct. 3. 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 4. No findings of acute intestinal ischemia. Mesenteric vasculature appears patent. 5. Mild hepatomegaly.  01/18/15 - CT Angio Abdomen/Pelvis. IMPRESSION: VASCULAR.  1. There has been no interval change in the size, appearance or configuration of the broad based but pedunculated bland thrombus arising from the anterior aspect of the descending thoracic aorta.  2. No new arterial abnormality.  3. Variant hepatic arterial anatomy noted incidentally. NON VASCULAR.  1. Expected evolution of superior and anterior splenic infarcts. No evidence of new infarct or sequelae of embolic phenomenon.  2. Probable mild focal fatty infiltration along the falciform ligament of the liver.   HISTORY OF PRESENT ILLNESS:  Suzanne [name] is a 39 y.o. female without significant past medical history who was recently hospitalized with severe left upper quadrant abdominal pain off 1 day duration, no worsening or relieving factors, accompanied by nausea and vomiting. CT scan revealed descending aortic thrombus as well as splenic infarct. Patient was seen by a vascular surgeon Dr. Lucky Cowboy recommended continuing anticoagulation and monitoring since her symptoms had started to  improve with anticoagulation. Patient has been discharged from hospital yesterday, she received IV heparin followed by oral warfarin which she is taking 3 mg daily. Denies any bleeding symptoms. States that she continues to have severe left upper quadrant pain, and has undergone out of oxycodone pills since she was given only 10 at the time of discharge. She denies any prior h/o thromboembolism in the past. Family history unknown as she is adopted. Appetite is good, denies unintentional weight loss. Denies known h/o malignancy. Hypercoagulable workup has been sent and is pending.  REVIEW OF SYSTEMS:   ROS As in HPI above. In addition, no fever, chills. No new headaches or focal weakness.  No  sore throat, cough, shortness of breath, sputum, hemoptysis or chest pain. No new constipation, diarrhea, dysuria or hematuria. No new skin rash or bleeding symptoms. No new paresthesias in extremities.   PAST MEDICAL HISTORY: Reviewed. Hospitalization September 2016 as above, for left upper quadrant abdominal pain, diagnosed with aortic thrombus and splenic infarct.  PAST SURGICAL HISTORY: Reviewed. History reviewed. No pertinent past surgical history.  FAMILY HISTORY: Reviewed. Family History  Problem Relation Age of Onset  . Adopted: Yes    SOCIAL HISTORY: Reviewed. Social History  Substance Use Topics  . Smoking status: Current Some Day Smoker -- 0.50 packs/day    Types: Cigarettes  . Smokeless tobacco: None  . Alcohol Use: Yes    No Known Allergies  Current Outpatient Prescriptions  Medication Sig Dispense Refill  . enoxaparin (LOVENOX) 40 MG/0.4ML injection Inject 0.65 mLs (65 mg total) into the skin every 12 (twelve) hours. 0.4 mL 0  . oxyCODONE (OXY IR/ROXICODONE) 5 MG immediate release tablet Take 1 tablet (5 mg total) by mouth every 4 (four) hours as needed for moderate pain.  60 tablet 0  . warfarin (COUMADIN) 3 MG tablet Take 1 tablet (3 mg total) by mouth daily at 6 PM. 30 tablet 1     No current facility-administered medications for this visit.    PHYSICAL EXAM: Filed Vitals:   01/19/15 0908  BP: 171/90  Pulse: 64  Temp: 97.6 F (36.4 C)  Resp: 18     Body mass index is 22.75 kg/(m^2).      GENERAL: Patient is alert and oriented and in no acute distress. There is no icterus. HEENT: EOMs intact. No cervical lymphadenopathy. CVS: S1S2, regular LUNGS: Bilaterally clear to auscultation, no rhonchi. ABDOMEN: Soft, left upper quadrant tenderness. No rigidity or guarding. No abdominal distention. Bowel sounds present. No hepatosplenomegaly clinically.  NEURO: grossly nonfocal, cranial nerves are intact. Gait unremarkable. EXTREMITIES: No pedal edema.  LAB RESULTS:    Component Value Date/Time   NA 137 01/17/2015 0347   NA 140 04/23/2011 1538   K 3.3* 01/17/2015 0347   K 3.9 04/23/2011 1538   CL 106 01/17/2015 0347   CL 105 04/23/2011 1538   CO2 24 01/17/2015 0347   CO2 25 04/23/2011 1538   GLUCOSE 90 01/17/2015 0347   GLUCOSE 117* 04/23/2011 1538   BUN 5* 01/17/2015 0347   BUN 2* 04/23/2011 1538   CREATININE 0.56 01/17/2015 0347   CREATININE 0.63 04/23/2011 1538   CALCIUM 7.7* 01/17/2015 0347   CALCIUM 8.9 04/23/2011 1538   PROT 5.9* 01/16/2015 0617   PROT 7.9 04/23/2011 1538   ALBUMIN 2.9* 01/16/2015 0617   ALBUMIN 3.5 04/23/2011 1538   AST 16 01/16/2015 0617   AST 21 04/23/2011 1538   ALT 9* 01/16/2015 0617   ALT 17 04/23/2011 1538   ALKPHOS 70 01/16/2015 0617   ALKPHOS 73 04/23/2011 1538   BILITOT 0.7 01/16/2015 0617   BILITOT 0.4 04/23/2011 1538   GFRNONAA >60 01/17/2015 0347   GFRNONAA >60 04/23/2011 1538   GFRAA >60 01/17/2015 0347   GFRAA >60 04/23/2011 1538    Lab Results  Component Value Date   WBC 12.0* 01/18/2015   HGB 15.4 01/18/2015   HCT 45.9 01/18/2015   MCV 93.5 01/18/2015   PLT 178 01/18/2015    STUDIES: Ct Abdomen Pelvis W Contrast  01/15/2015   CLINICAL DATA:  Midline abdominal pain. Vomiting. Periumbilical pain.  Leukocytosis.  EXAM: CT ABDOMEN AND PELVIS WITH CONTRAST  TECHNIQUE: Multidetector CT imaging of the abdomen and pelvis was performed using the standard protocol following bolus administration of intravenous contrast.  CONTRAST:  179mL OMNIPAQUE IOHEXOL 300 MG/ML  SOLN  COMPARISON:  None.  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 Marginal osteophytes are also present in the RIGHT femoral head. dextroconvex curve of the thoracolumbar spine may be positional.  Lung Bases: Dependent atelectasis.  Liver: Fatty infiltration adjacent to the falciform ligament of the liver. Mild hepatomegaly with 18.5 cm liver span and rounding of the inferior RIGHT hepatic lobe.  Spleen: Infarction of the spleen is present involving the anterior and superior portions. This is probably acute or subacute.  Gallbladder:  Normal.  Common bile duct:  Normal.  Pancreas: Mild prominence of pancreatic duct. No obstructing lesion identified. Small lipoma is present in the anterior body of the pancreas.  Adrenal glands:  Normal bilaterally.  Kidneys: LEFT kidney appears normal. No calculi. The LEFT ureter is normal.  The RIGHT kidney demonstrates areas of scarring. In the interpolar region, there is an area  of cortical thinning which is most compatible with scarring rather than acute infarct. There are no discrete wedge-shaped perfusion defects in the kidneys. The RIGHT ureter appears normal.  Stomach:  Distended with oral contrast.  No inflammatory changes.  Small bowel: No obstruction or evidence of intestinal ischemia. No mural thickening.  Colon: Normal appendix. Ascending colon appears normal. Transverse colon is normal. Descending and rectosigmoid colon at normal.  Pelvic Genitourinary: Physiologic appearance of the uterus. Retroverted uterus. Urinary bladder appears normal.  Peritoneum: Physiologic free fluid in the anatomic pelvis. No free air.   Vascular/lymphatic: There is a large exophytic thrombus in the descending thoracic aorta. This probably represents an embolic source of infarction of the spleen. The distal aorta appears normal. Mild iliofemoral atherosclerotic calcification.  Body Wall: Normal.  IMPRESSION: 1. Large descending thoracic aortic thrombus extending from the superior margin of the scan to the aortic hiatus. This likely represents the source of embolic infarction to the spleen. No abdominal aortic dissection or aneurysm. 2. Acute or subacute anterior splenic infarct. 3. 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 4. No findings of acute intestinal ischemia. Mesenteric vasculature appears patent. 5. Mild hepatomegaly. Critical Value/emergent results were called by telephone at the time of interpretation on 01/15/2015 at 8:16 pm to Dr. Marjean Donna , who verbally acknowledged these results. Vascular consultation was discussed during the phone call. Contrast-enhanced imaging of the chest should be considered to fully visualize the aortic thrombus which is only partially seen on this abdominal study.   Electronically Signed   By: Dereck Ligas M.D.   On: 01/15/2015 20:25   Ct Angio Chest Aorta W/cm &/or Wo/cm  01/15/2015   CLINICAL DATA:  Aortic thrombus seen on abdominal CT. Evaluate for thoracic origins/extension. Acute embolic splenic infarction.  EXAM: CT ANGIOGRAPHY CHEST WITH CONTRAST  TECHNIQUE: Multidetector CT imaging of the chest was performed using the standard protocol during bolus administration of intravenous contrast. Multiplanar CT image reconstructions and MIPs were obtained to evaluate the vascular anatomy.  CONTRAST:  35mL OMNIPAQUE IOHEXOL 350 MG/ML SOLN  COMPARISON:  None.  FINDINGS: Bones: Mild thoracic spondylosis.  No aggressive osseous lesion.  Cardiovascular: There is no aortic dissection. The ascending aorta and aortic arch appear normal. Three vessel aortic arch with normal opacification of  the branch vessels. In the descending thoracic aorta, the previously seen mural thrombus is present. This becomes pedunculated at the level of the esophageal hiatus. The pedunculated intraluminal component measures 31 mm in length (image 86 series 8). Total length of the thrombus is 10.7 cm, extending from the superior LEFT atrium to the level of the diaphragmatic crus. This arises from the anterior wall of the descending thoracic aorta. No underlying aortic injury is identified to account for thrombus formation. This raises the possibility of a hypercoagulable state.  Lungs: Paraseptal emphysema.  No airspace disease.  Central airways: Patent.  Effusions: None.  Lymphadenopathy: No axillary adenopathy. No mediastinal or hilar adenopathy.  Esophagus: Normal.  Other: None.  Review of the MIP images confirms the above findings.  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 No aortic dissection or underlying acute aortic abnormality to account for the thrombus formation.   Electronically Signed   By: Dereck Ligas M.D.   On: 01/15/2015 21:05   Ct Angio Abd/pel W/ And/or W/o  01/18/2015   CLINICAL DATA:  39 year old female with aortic thrombus currently on anticoagulation.  EXAM: CTA  ABDOMEN AND PELVIS wITHOUT AND WITH CONTRAST  TECHNIQUE: Multidetector CT imaging of the abdomen and pelvis was performed using the standard protocol during bolus administration of intravenous contrast. Multiplanar reconstructed images and MIPs were obtained and reviewed to evaluate the vascular anatomy.  CONTRAST:  120mL OMNIPAQUE IOHEXOL 350 MG/ML SOLN  COMPARISON:  CT chest, abdomen and pelvis 01/15/2015 and  FINDINGS: VASCULAR  Aorta: Persistent broad-based but pedunculated thrombus within the descending thoracic aorta. There has been no interval change in the size or configuration of the thrombus compared to 01/15/2015. The  thrombus is low in attenuation and not enhancing.  Celiac: Widely patent. The left hepatic artery is replaced to the left gastric artery.  SMA: Widely patent.  Renals: Single renal arteries bilaterally which are widely patent.  IMA: Widely patent.  Inflow: Widely patent and unremarkable.  Proximal Outflow: Widely patent and unremarkable  Veins: No focal venous abnormality.  NON-VASCULAR  Lower Chest: The lung bases are clear. Visualized cardiac structures are within normal limits for size. No pericardial effusion. Unremarkable visualized distal thoracic esophagus.  Abdomen: Unremarkable CT appearance of the stomach, duodenum, adrenal glands and pancreas. Normal hepatic contour morphology. No discrete hepatic lesion. Geographic hypoattenuation in the left hemi-liver adjacent to the fissure for the falciform ligament is nonspecific but most suggestive of benign focal fatty infiltration. Gallbladder is unremarkable. No intra or extrahepatic biliary ductal dilatation.  Expected evolution of splenic infarcts with persistent devascularization in the upper pole and anterior aspect of the spleen. Unremarkable appearance of the bilateral kidneys. No focal solid lesion, hydronephrosis or nephrolithiasis. No evidence of obstruction or focal bowel wall thickening. Normal appendix in the right lower quadrant. The terminal ileum is unremarkable. No free fluid or suspicious adenopathy.  Pelvis: Unremarkable bladder, uterus and adnexa. No suspicious adenopathy.  Bones/Soft Tissues: No acute fracture or aggressive appearing lytic or blastic osseous lesion.  Review of the MIP images confirms the above findings.  IMPRESSION: VASCULAR  1. There has been no interval change in the size, appearance or configuration of the broad based but pedunculated bland thrombus arising from the anterior aspect of the descending thoracic aorta. 2. No new arterial abnormality. 3. Variant hepatic arterial anatomy noted incidentally. NON VASCULAR  1.  Expected evolution of superior and anterior splenic infarcts. No evidence of new infarct or sequelae of embolic phenomenon. 2. Probable mild focal fatty infiltration along the falciform ligament of the liver. Signed,  Criselda Peaches, MD  [facility]  [facility]   Electronically Signed   By: Jacqulynn Cadet M.D.   On: 01/18/2015 09:37     ASSESSMENT / PLAN:   1. Recently diagnosed aortic thrombus, splenic infarct during hospitalization for left upper quadrant pain on 01/15/15 as above -  CT scan as described above reported Descending thoracic aortic thrombus arising from the anterior wall and becoming pedunculated around the level of the esophageal hiatus, no aortic dissection or underlying acute aortic abnormality to account for the thrombus formation, and splenic infarct. Have explained to patient that this is very unusual, and most of the time aortic thrombus occurs in the setting of aneurysm, dissection or atherosclerotic. 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 Workup is pending at this time, we will see her back in about 2 weeks by which time he should have results  of workup and make further recommendations. Continue anticoagulation with Coumadin, PT INR today is 2.0, continue Coumadin 3 mg daily. We will get a PT/INR next Tuesday and Friday, following which she will see MD for follow-up on October 12. Also have requested close follow-up with vascular surgeon and she has been given an appointment for October 21 with Dr.Dew.   2. Chronic mild leukocytosis - could be secondary to chronic smoking history. Given about thromboembolic episode, have also  requested BCR-ABL study and peripheral blood flow cytometry which are  pending at this time and will need to be followed up on October 12.  3. Patient also had elevated hematocrit (erythrocytosis) of 54.9%, elevated hemoglobin of 18.3 on September 26 but repeat CBC on September 28 reported normal hemoglobin at 14.7 and normal hematocrit at 45.4%, with repeat CBC on October 12.  4. 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 Patient advised about possible side effects including constipation and to take stool softener along with laxity as indicated, and also advised to avoid operating motor vehicles or machinery while on pain medication. She is agreeable to this.  5. Smoking Cessation - strongly advised to completely quit smoking. She does not want to try back to some medication and states that she would like to quit on her own.  In between visits, the patient has been advised to call or come to the ER in case of fevers, chills, bleeding, acute sickness or new symptoms. Patient is agreeable to this plan.   Leia Alf, MD   01/19/2015 10:50 AM

## 2015-01-19 NOTE — Progress Notes (Signed)
Patient is here for follow-up post hospital discharge. She was discharged yesterday. She states that she is still having a lot of pain and rates her pain a 7/10 today.

## 2015-01-22 LAB — PROTEIN ELECTROPHORESIS, SERUM
A/G RATIO SPE: 0.9 (ref 0.7–1.7)
ALPHA-2-GLOBULIN: 0.8 g/dL (ref 0.4–1.0)
Albumin ELP: 2.4 g/dL — ABNORMAL LOW (ref 2.9–4.4)
Alpha-1-Globulin: 0.3 g/dL (ref 0.0–0.4)
BETA GLOBULIN: 0.7 g/dL (ref 0.7–1.3)
GAMMA GLOBULIN: 0.8 g/dL (ref 0.4–1.8)
Globulin, Total: 2.7 g/dL (ref 2.2–3.9)
Total Protein ELP: 5.1 g/dL — ABNORMAL LOW (ref 6.0–8.5)

## 2015-01-23 ENCOUNTER — Inpatient Hospital Stay: Payer: Medicaid Other | Attending: Internal Medicine

## 2015-01-23 DIAGNOSIS — R1012 Left upper quadrant pain: Secondary | ICD-10-CM | POA: Insufficient documentation

## 2015-01-23 DIAGNOSIS — F17210 Nicotine dependence, cigarettes, uncomplicated: Secondary | ICD-10-CM | POA: Insufficient documentation

## 2015-01-23 DIAGNOSIS — I7410 Embolism and thrombosis of unspecified parts of aorta: Secondary | ICD-10-CM | POA: Insufficient documentation

## 2015-01-23 DIAGNOSIS — D735 Infarction of spleen: Secondary | ICD-10-CM | POA: Insufficient documentation

## 2015-01-23 DIAGNOSIS — Z79899 Other long term (current) drug therapy: Secondary | ICD-10-CM | POA: Insufficient documentation

## 2015-01-23 DIAGNOSIS — Z7901 Long term (current) use of anticoagulants: Secondary | ICD-10-CM | POA: Insufficient documentation

## 2015-01-24 ENCOUNTER — Inpatient Hospital Stay: Payer: Medicaid Other

## 2015-01-24 DIAGNOSIS — Z79899 Other long term (current) drug therapy: Secondary | ICD-10-CM | POA: Diagnosis not present

## 2015-01-24 DIAGNOSIS — R1012 Left upper quadrant pain: Secondary | ICD-10-CM | POA: Diagnosis not present

## 2015-01-24 DIAGNOSIS — Z7901 Long term (current) use of anticoagulants: Secondary | ICD-10-CM | POA: Diagnosis not present

## 2015-01-24 DIAGNOSIS — I7410 Embolism and thrombosis of unspecified parts of aorta: Secondary | ICD-10-CM | POA: Diagnosis not present

## 2015-01-24 DIAGNOSIS — D735 Infarction of spleen: Secondary | ICD-10-CM | POA: Diagnosis not present

## 2015-01-24 DIAGNOSIS — F17210 Nicotine dependence, cigarettes, uncomplicated: Secondary | ICD-10-CM | POA: Diagnosis not present

## 2015-01-24 LAB — PROTIME-INR
INR: 1.44
PROTHROMBIN TIME: 17.7 s — AB (ref 11.4–15.0)

## 2015-01-25 LAB — IGG, IGA, IGM
IGA: 133 mg/dL (ref 87–352)
IGG (IMMUNOGLOBIN G), SERUM: 774 mg/dL (ref 700–1600)
IgM, Serum: 197 mg/dL (ref 26–217)

## 2015-01-25 LAB — BCR-ABL1, CML/ALL, PCR, QUANT

## 2015-01-25 LAB — COMP PANEL: LEUKEMIA/LYMPHOMA

## 2015-01-26 ENCOUNTER — Other Ambulatory Visit: Payer: Self-pay | Admitting: *Deleted

## 2015-01-26 ENCOUNTER — Other Ambulatory Visit: Payer: Medicaid Other

## 2015-01-26 ENCOUNTER — Inpatient Hospital Stay: Payer: Medicaid Other

## 2015-01-26 DIAGNOSIS — D735 Infarction of spleen: Secondary | ICD-10-CM | POA: Diagnosis not present

## 2015-01-26 DIAGNOSIS — I7410 Embolism and thrombosis of unspecified parts of aorta: Secondary | ICD-10-CM

## 2015-01-26 LAB — PROTIME-INR
INR: 1.44
PROTHROMBIN TIME: 17.7 s — AB (ref 11.4–15.0)

## 2015-01-30 ENCOUNTER — Encounter: Payer: Self-pay | Admitting: *Deleted

## 2015-01-31 ENCOUNTER — Inpatient Hospital Stay: Payer: Medicaid Other

## 2015-01-31 ENCOUNTER — Inpatient Hospital Stay (HOSPITAL_BASED_OUTPATIENT_CLINIC_OR_DEPARTMENT_OTHER): Payer: Medicaid Other | Admitting: Internal Medicine

## 2015-01-31 ENCOUNTER — Encounter: Payer: Self-pay | Admitting: Internal Medicine

## 2015-01-31 ENCOUNTER — Other Ambulatory Visit: Payer: Self-pay | Admitting: Family Medicine

## 2015-01-31 ENCOUNTER — Telehealth: Payer: Self-pay | Admitting: *Deleted

## 2015-01-31 VITALS — BP 142/92 | HR 64 | Temp 97.0°F | Resp 18 | Ht 67.0 in | Wt 144.6 lb

## 2015-01-31 DIAGNOSIS — D735 Infarction of spleen: Secondary | ICD-10-CM

## 2015-01-31 DIAGNOSIS — I7410 Embolism and thrombosis of unspecified parts of aorta: Secondary | ICD-10-CM

## 2015-01-31 DIAGNOSIS — Z79899 Other long term (current) drug therapy: Secondary | ICD-10-CM

## 2015-01-31 DIAGNOSIS — R1012 Left upper quadrant pain: Secondary | ICD-10-CM

## 2015-01-31 DIAGNOSIS — Z7901 Long term (current) use of anticoagulants: Secondary | ICD-10-CM

## 2015-01-31 DIAGNOSIS — F17210 Nicotine dependence, cigarettes, uncomplicated: Secondary | ICD-10-CM

## 2015-01-31 LAB — CBC WITH DIFFERENTIAL/PLATELET
BASOS ABS: 0.2 10*3/uL — AB (ref 0–0.1)
BASOS PCT: 1 %
EOS PCT: 3 %
Eosinophils Absolute: 0.3 10*3/uL (ref 0–0.7)
HEMATOCRIT: 50.4 % — AB (ref 35.0–47.0)
Hemoglobin: 16.8 g/dL — ABNORMAL HIGH (ref 12.0–16.0)
LYMPHS PCT: 35 %
Lymphs Abs: 3.9 10*3/uL — ABNORMAL HIGH (ref 1.0–3.6)
MCH: 30.1 pg (ref 26.0–34.0)
MCHC: 33.3 g/dL (ref 32.0–36.0)
MCV: 90.3 fL (ref 80.0–100.0)
MONO ABS: 1 10*3/uL — AB (ref 0.2–0.9)
Monocytes Relative: 9 %
NEUTROS ABS: 5.8 10*3/uL (ref 1.4–6.5)
Neutrophils Relative %: 52 %
PLATELETS: 741 10*3/uL — AB (ref 150–440)
RBC: 5.58 MIL/uL — AB (ref 3.80–5.20)
RDW: 14.4 % (ref 11.5–14.5)
WBC: 11.2 10*3/uL — AB (ref 3.6–11.0)

## 2015-01-31 LAB — HEPATIC FUNCTION PANEL
ALT: 16 U/L (ref 14–54)
AST: 24 U/L (ref 15–41)
Albumin: 3.8 g/dL (ref 3.5–5.0)
Alkaline Phosphatase: 72 U/L (ref 38–126)
TOTAL PROTEIN: 7.5 g/dL (ref 6.5–8.1)
Total Bilirubin: 0.3 mg/dL (ref 0.3–1.2)

## 2015-01-31 LAB — PROTIME-INR
INR: 1.77
INR: 1.8 — AB (ref 0.9–1.1)
Prothrombin Time: 20.8 seconds — ABNORMAL HIGH (ref 11.4–15.0)

## 2015-01-31 LAB — CREATININE, SERUM: Creatinine, Ser: 0.63 mg/dL (ref 0.44–1.00)

## 2015-01-31 MED ORDER — WARFARIN SODIUM 3 MG PO TABS
ORAL_TABLET | ORAL | Status: DC
Start: 2015-01-31 — End: 2015-02-28

## 2015-01-31 MED ORDER — WARFARIN SODIUM 3 MG PO TABS: 3.0000 mg | ORAL_TABLET | Freq: Every day | ORAL | Status: DC

## 2015-01-31 MED ORDER — OXYCODONE HCL 10 MG PO TABS: 10.0000 mg | ORAL_TABLET | Freq: Four times a day (QID) | ORAL | Status: DC | PRN

## 2015-01-31 NOTE — Telephone Encounter (Signed)
New rx written by L Herring, AGNP-C and pt notified to come pick up rx

## 2015-01-31 NOTE — Addendum Note (Signed)
Addended by: Betti Cruz on: 01/31/2015 03:56 PM   Modules accepted: Orders

## 2015-01-31 NOTE — Progress Notes (Signed)
[place] OFFICE PROGRESS NOTE  Patient Care Team: No Pcp Per Patient as PCP - General (General Practice)   SUMMARY OF HEMATOLOGIC-ONCOLOGIC HISTORY:  # SEP 2016- AORTIC THROMBUS/splenic infarct- Coumadin [Factor V/Prothrombin gene/ APS work up-NEG; Protein C- slightly Low; 57; Protein C activity-N]; prot S- Normal   INTERVAL HISTORY:   A very pleasant 39 year old female patient  With above history of aortic thrombus  With infarction of the spleen currently on Coumadin is here for follow-up/ review the results of her hypercoagulable workup.   Patient complains of  Extreme pain  In the left upper quadrant especially with movement.  She denies any blood in stools black stools.  Denies any nausea vomiting.  Denies any symptoms of strokes or unusual shortness of breath or cough or hemoptysis.   REVIEW OF SYSTEMS:  A complete 10 point review of system is done which is negative except mentioned above/history of present illness.   PAST MEDICAL HISTORY :  Past Medical History  Diagnosis Date  . Aortic thrombus ([place])   . 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 No pertinent past surgical history.  FAMILY HISTORY :   Family History  Problem Relation Age of Onset  . Adopted: Yes    SOCIAL HISTORY:   Social History  Substance Use Topics  . Smoking status: Current Some Day Smoker -- 0.25 packs/day for 20 years    Types: Cigarettes  . Smokeless tobacco: Never Used  . Alcohol Use: 0.0 oz/week    0 Standard drinks or equivalent per week    ALLERGIES:  has No Known Allergies.  MEDICATIONS:  Current Outpatient Prescriptions  Medication Sig Dispense Refill  . oxyCODONE (OXY IR/ROXICODONE) 5 MG immediate release tablet Take 1 tablet (5 mg total) by mouth every 4 (four) hours as needed for moderate pain. 60 tablet 0  . enoxaparin (LOVENOX) 40 MG/0.4ML injection Inject 0.65 mLs (65 mg total) into the skin every 12 (twelve) hours. 0.4 mL 0  .  warfarin (COUMADIN) 3 MG tablet Take 1 tablet (3 mg total) by mouth daily at 6 PM. (Patient taking differently: Take 3 mg by mouth daily at 6 PM. Patient states she is taking Warfarin 3mg  tabs.  2 tabs (6mg ) on Monday, Wednesday, Friday and Sunday. Taking 1 tablet (3mg ) on Tuesday, Thursday and Saturday.) 30 tablet 1   No current facility-administered medications for this visit.    PHYSICAL EXAMINATION: ECOG PERFORMANCE STATUS: 0 - Asymptomatic  BP 142/92 mmHg  Pulse 64  Temp(Src) 97 F (36.1 C) (Tympanic)  Resp 18  Ht 5\' 7"  (1.702 m)  Wt 144 lb 10 oz (65.6 kg)  BMI 22.65 kg/m2  SpO2 97%  LMP 01/01/2015 (Approximate)  Filed Weights   01/31/15 1114  Weight: 144 lb 10 oz (65.6 kg)    GENERAL: Well-nourished well-developed; Alert, no distress and comfortable.   Alone.  EYES: no pallor or icterus OROPHARYNX: no thrush or ulceration; good dentition  NECK: supple, no masses felt LYMPH:  no palpable lymphadenopathy in the cervical, axillary or inguinal regions LUNGS: clear to auscultation and  No wheeze or crackles HEART/CVS: regular rate & rhythm and no murmurs; No lower extremity edema ABDOMEN:abdomen soft, tender and normal bowel sounds Musculoskeletal:no cyanosis of digits and no clubbing  PSYCH: alert & oriented x 3 with fluent speech NEURO: no focal motor/sensory deficits SKIN:  no rashes or significant lesions  LABORATORY DATA:  I have reviewed the data as listed  Component Value Date/Time   NA 137 01/17/2015 0347   NA 140 04/23/2011 1538   K 3.3* 01/17/2015 0347   K 3.9 04/23/2011 1538   CL 106 01/17/2015 0347   CL 105 04/23/2011 1538   CO2 24 01/17/2015 0347   CO2 25 04/23/2011 1538   GLUCOSE 90 01/17/2015 0347   GLUCOSE 117* 04/23/2011 1538   BUN 5* 01/17/2015 0347   BUN 2* 04/23/2011 1538   CREATININE 0.63 01/31/2015 1055   CREATININE 0.63 04/23/2011 1538   CALCIUM 7.7* 01/17/2015 0347   CALCIUM 8.9 04/23/2011 1538   PROT 7.5 01/31/2015 1055   PROT 7.9  04/23/2011 1538   ALBUMIN 3.8 01/31/2015 1055   ALBUMIN 3.5 04/23/2011 1538   AST 24 01/31/2015 1055   AST 21 04/23/2011 1538   ALT 16 01/31/2015 1055   ALT 17 04/23/2011 1538   ALKPHOS 72 01/31/2015 1055   ALKPHOS 73 04/23/2011 1538   BILITOT 0.3 01/31/2015 1055   BILITOT 0.4 04/23/2011 1538   GFRNONAA >60 01/31/2015 1055   GFRNONAA >60 04/23/2011 1538   GFRAA >60 01/31/2015 1055   GFRAA >60 04/23/2011 1538    No results found for: SPEP, UPEP  Lab Results  Component Value Date   WBC 11.2* 01/31/2015   NEUTROABS 5.8 01/31/2015   HGB 16.8* 01/31/2015   HCT 50.4* 01/31/2015   MCV 90.3 01/31/2015   PLT 741* 01/31/2015      Chemistry      Component Value Date/Time   NA 137 01/17/2015 0347   NA 140 04/23/2011 1538   K 3.3* 01/17/2015 0347   K 3.9 04/23/2011 1538   CL 106 01/17/2015 0347   CL 105 04/23/2011 1538   CO2 24 01/17/2015 0347   CO2 25 04/23/2011 1538   BUN 5* 01/17/2015 0347   BUN 2* 04/23/2011 1538   CREATININE 0.63 01/31/2015 1055   CREATININE 0.63 04/23/2011 1538      Component Value Date/Time   CALCIUM 7.7* 01/17/2015 0347   CALCIUM 8.9 04/23/2011 1538   ALKPHOS 72 01/31/2015 1055   ALKPHOS 73 04/23/2011 1538   AST 24 01/31/2015 1055   AST 21 04/23/2011 1538   ALT 16 01/31/2015 1055   ALT 17 04/23/2011 1538   BILITOT 0.3 01/31/2015 1055   BILITOT 0.4 04/23/2011 1538       RADIOGRAPHIC STUDIES: I have personally reviewed the radiological images as listed and agreed with the findings in the report. No results found.   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  As summarized above  Patient does not have any  Identifiable cause of her  Hypercoagulable state.  This was discussed at length with the patient.   on Coumadin; INR 1.7. Patient is currently on Coumadin 6 mg 4 times a week; 3 mg 3 times a week I recommend going up on the Coumadin 6 mg 5 times a week; 3 mg rest of the days.   I recommend checking PT/INR on a  weekly basis.   # Pain from splenic infarction- quite intense as per patient. Not very well controlled on oxycodone 5 mg. A new prescription for oxycodone 10 mg every 6 hours was given.    All questions were answered. The patient knows to call the clinic with any problems, questions or concerns. No barriers to learning was detected.  I spent 25 minutes counseling the patient face to face. The total time spent in the appointment was 25 minutes and more than  50% was on counseling and review of test results     Cammie Sickle, MD 01/31/2015 11:35 AM

## 2015-01-31 NOTE — Patient Instructions (Addendum)
Take 6 mg Coumadin 5 times a week (Monday, Wednesday, Friday, Saturday and Sunday) and take the 3 mg tablet 2 days of week (Tuesday and Thursdays).           Steps to Quit Smoking  Smoking tobacco can be harmful to your health and can affect almost every organ in your body. Smoking puts you, and those around you, at risk for developing many serious chronic diseases. Quitting smoking is difficult, but it is one of the best things that you can do for your health. It is never too late to quit. WHAT ARE THE BENEFITS OF QUITTING SMOKING? When you quit smoking, you lower your risk of developing serious diseases and conditions, such as:  Lung cancer or lung disease, such as COPD.  Heart disease.  Stroke.  Heart attack.  Infertility.  Osteoporosis and bone fractures. Additionally, symptoms such as coughing, wheezing, and shortness of breath may get better when you quit. You may also find that you get sick less often because your body is stronger at fighting off colds and infections. If you are pregnant, quitting smoking can help to reduce your chances of having a baby of low birth weight. HOW DO I GET READY TO QUIT? When you decide to quit smoking, create a plan to make sure that you are successful. Before you quit:  Pick a date to quit. Set a date within the next two weeks to give you time to prepare.  Write down the reasons why you are quitting. Keep this list in places where you will see it often, such as on your bathroom mirror or in your car or wallet.  Identify the people, places, things, and activities that make you want to smoke (triggers) and avoid them. Make sure to take these actions:  Throw away all cigarettes at home, at work, and in your car.  Throw away smoking accessories, such as Scientist, research (medical).  Clean your car and make sure to empty the ashtray.  Clean your home, including curtains and carpets.  Tell your family, friends, and coworkers that you are  quitting. Support from your loved ones can make quitting easier.  Talk with your health care provider about your options for quitting smoking.  Find out what treatment options are covered by your health insurance. WHAT STRATEGIES CAN I USE TO QUIT SMOKING?  Talk with your healthcare provider about different strategies to quit smoking. Some strategies include:  Quitting smoking altogether instead of gradually lessening how much you smoke over a period of time. Research shows that quitting "cold [place]" is more successful than gradually quitting.  Attending in-person counseling to help you build problem-solving skills. You are more likely to have success in quitting if you attend several counseling sessions. Even short sessions of 10 minutes can be effective.  Finding resources and support systems that can help you to quit smoking and remain smoke-free after you quit. These resources are most helpful when you use them often. They can include:  Online chats with a Social worker.  Telephone quitlines.  Printed Furniture conservator/restorer.  Support groups or group counseling.  Text messaging programs.  Mobile phone applications.  Taking medicines to help you quit smoking. (If you are pregnant or breastfeeding, talk with your health care provider first.) Some medicines contain nicotine and some do not. Both types of medicines help with cravings, but the medicines that include nicotine help to relieve withdrawal symptoms. Your health care provider may recommend:  Nicotine patches, gum, or lozenges.  Nicotine  inhalers or sprays.  Non-nicotine medicine that is taken by mouth. Talk with your health care provider about combining strategies, such as taking medicines while you are also receiving in-person counseling. Using these two strategies together makes you more likely to succeed in quitting than if you used either strategy on its own. If you are pregnant or breastfeeding, talk with your health care  provider about finding counseling or other support strategies to quit smoking. Do not take medicine to help you quit smoking unless told to do so by your health care provider. WHAT THINGS CAN I DO TO MAKE IT EASIER TO QUIT? Quitting smoking might feel overwhelming at first, but there is a lot that you can do to make it easier. Take these important actions:  Reach out to your family and friends and ask that they support and encourage you during this time. Call telephone quitlines, reach out to support groups, or work with a counselor for support.  Ask people who smoke to avoid smoking around you.  Avoid places that trigger you to smoke, such as bars, parties, or smoke-break areas at work.  Spend time around people who do not smoke.  Lessen stress in your life, because stress can be a smoking trigger for some people. To lessen stress, try:  Exercising regularly.  Deep-breathing exercises.  Yoga.  Meditating.  Performing a body scan. This involves closing your eyes, scanning your body from head to toe, and noticing which parts of your body are particularly tense. Purposefully relax the muscles in those areas.  Download or purchase mobile phone or tablet apps (applications) that can help you stick to your quit plan by providing reminders, tips, and encouragement. There are many free apps, such as QuitGuide from the [employer] Office manager for Disease Control and Prevention). You can find other support for quitting smoking (smoking cessation) through smokefree.gov and other websites. HOW WILL I FEEL WHEN I QUIT SMOKING? Within the first 24 hours of quitting smoking, you may start to feel some withdrawal symptoms. These symptoms are usually most noticeable 2-3 days after quitting, but they usually do not last beyond 2-3 weeks. Changes or symptoms that you might experience include:  Mood swings.  Restlessness, anxiety, or irritation.  Difficulty concentrating.  Dizziness.  Strong cravings for  sugary foods in addition to nicotine.  Mild weight gain.  Constipation.  Nausea.  Coughing or a sore throat.  Changes in how your medicines work in your body.  A depressed mood.  Difficulty sleeping (insomnia). After the first 2-3 weeks of quitting, you may start to notice more positive results, such as:  Improved sense of smell and taste.  Decreased coughing and sore throat.  Slower heart rate.  Lower blood pressure.  Clearer skin.  The ability to breathe more easily.  Fewer sick days. Quitting smoking is very challenging for most people. Do not get discouraged if you are not successful the first time. Some people need to make many attempts to quit before they achieve long-term success. Do your best to stick to your quit plan, and talk with your health care provider if you have any questions or concerns.   This information is not intended to replace advice given to you by your health care provider. Make sure you discuss any questions you have with your health care provider.   Document Released: 04/01/2001 Document Revised: 08/22/2014 Document Reviewed: 08/22/2014 Elsevier Interactive Patient Education 2016 [employer].     Smoking Cessation, Tips for Success If you are ready to  quit smoking, congratulations! You have chosen to help yourself be healthier. Cigarettes bring nicotine, tar, carbon monoxide, and other irritants into your body. Your lungs, heart, and blood vessels will be able to work better without these poisons. There are many different ways to quit smoking. Nicotine gum, nicotine patches, a nicotine inhaler, or nicotine nasal spray can help with physical craving. Hypnosis, support groups, and medicines help break the habit of smoking. WHAT THINGS CAN I DO TO MAKE QUITTING EASIER?  Here are some tips to help you quit for good:  Pick a date when you will quit smoking completely. Tell all of your friends and family about your plan to quit on that date.  Do  not try to slowly cut down on the number of cigarettes you are smoking. Pick a quit date and quit smoking completely starting on that day.  Throw away all cigarettes.   Clean and remove all ashtrays from your home, work, and car.  On a card, write down your reasons for quitting. Carry the card with you and read it when you get the urge to smoke.  Cleanse your body of nicotine. Drink enough water and fluids to keep your urine clear or pale yellow. Do this after quitting to flush the nicotine from your body.  Learn to predict your moods. Do not let a bad situation be your excuse to have a cigarette. Some situations in your life might tempt you into wanting a cigarette.  Never have "just one" cigarette. It leads to wanting another and another. Remind yourself of your decision to quit.  Change habits associated with smoking. If you smoked while driving or when feeling stressed, try other activities to replace smoking. Stand up when drinking your coffee. Brush your teeth after eating. Sit in a different chair when you read the paper. Avoid alcohol while trying to quit, and try to drink fewer caffeinated beverages. Alcohol and caffeine may urge you to smoke.  Avoid foods and drinks that can trigger a desire to smoke, such as sugary or spicy foods and alcohol.  Ask people who smoke not to smoke around you.  Have something planned to do right after eating or having a cup of coffee. For example, plan to take a walk or exercise.  Try a relaxation exercise to calm you down and decrease your stress. Remember, you may be tense and nervous for the first 2 weeks after you quit, but this will pass.  Find new activities to keep your hands busy. Play with a pen, coin, or rubber band. Doodle or draw things on paper.  Brush your teeth right after eating. This will help cut down on the craving for the taste of tobacco after meals. You can also try mouthwash.   Use oral substitutes in place of cigarettes.  Try using lemon drops, carrots, cinnamon sticks, or chewing gum. Keep them handy so they are available when you have the urge to smoke.  When you have the urge to smoke, try deep breathing.  Designate your home as a nonsmoking area.  If you are a heavy smoker, ask your health care provider about a prescription for nicotine chewing gum. It can ease your withdrawal from nicotine.  Reward yourself. Set aside the cigarette money you save and buy yourself something nice.  Look for support from others. Join a support group or smoking cessation program. Ask someone at home or at work to help you with your plan to quit smoking.  Always ask yourself, "Do I  need this cigarette or is this just a reflex?" Tell yourself, "Today, I choose not to smoke," or "I do not want to smoke." You are reminding yourself of your decision to quit.  Do not replace cigarette smoking with electronic cigarettes (commonly called e-cigarettes). The safety of e-cigarettes is unknown, and some may contain harmful chemicals.  If you relapse, do not give up! Plan ahead and think about what you will do the next time you get the urge to smoke. HOW WILL I FEEL WHEN I QUIT SMOKING? You may have symptoms of withdrawal because your body is used to nicotine (the addictive substance in cigarettes). You may crave cigarettes, be irritable, feel very hungry, cough often, get headaches, or have difficulty concentrating. The withdrawal symptoms are only temporary. They are strongest when you first quit but will go away within 10-14 days. When withdrawal symptoms occur, stay in control. Think about your reasons for quitting. Remind yourself that these are signs that your body is healing and getting used to being without cigarettes. Remember that withdrawal symptoms are easier to treat than the major diseases that smoking can cause.  Even after the withdrawal is over, expect periodic urges to smoke. However, these cravings are generally short lived  and will go away whether you smoke or not. Do not smoke! WHAT RESOURCES ARE AVAILABLE TO HELP ME QUIT SMOKING? Your health care provider can direct you to community resources or hospitals for support, which may include:  Group support.  Education.  Hypnosis.  Therapy.   This information is not intended to replace advice given to you by your health care provider. Make sure you discuss any questions you have with your health care provider.   Document Released: 01/04/2004 Document Revised: 04/28/2014 Document Reviewed: 09/23/2012 Elsevier Interactive Patient Education [employer].

## 2015-01-31 NOTE — Telephone Encounter (Signed)
Patient has [employer] insurance and pharmacy will not fill her rx written this morning. Needs someone else to write rx " TODAY"

## 2015-02-07 ENCOUNTER — Inpatient Hospital Stay: Payer: Medicaid Other

## 2015-02-07 DIAGNOSIS — I7410 Embolism and thrombosis of unspecified parts of aorta: Secondary | ICD-10-CM

## 2015-02-07 DIAGNOSIS — D735 Infarction of spleen: Secondary | ICD-10-CM | POA: Diagnosis not present

## 2015-02-07 LAB — PROTIME-INR
INR: 2.24
Prothrombin Time: 24.9 seconds — ABNORMAL HIGH (ref 11.4–15.0)

## 2015-02-09 ENCOUNTER — Other Ambulatory Visit: Payer: Self-pay | Admitting: Vascular Surgery

## 2015-02-09 DIAGNOSIS — D735 Infarction of spleen: Secondary | ICD-10-CM

## 2015-02-09 DIAGNOSIS — I7409 Other arterial embolism and thrombosis of abdominal aorta: Secondary | ICD-10-CM

## 2015-02-14 ENCOUNTER — Inpatient Hospital Stay: Payer: Medicaid Other

## 2015-02-14 DIAGNOSIS — I7410 Embolism and thrombosis of unspecified parts of aorta: Secondary | ICD-10-CM

## 2015-02-14 DIAGNOSIS — D735 Infarction of spleen: Secondary | ICD-10-CM | POA: Diagnosis not present

## 2015-02-14 LAB — PROTIME-INR
INR: 2.75
Prothrombin Time: 29.2 seconds — ABNORMAL HIGH (ref 11.4–15.0)

## 2015-02-17 ENCOUNTER — Encounter: Payer: Self-pay | Admitting: Internal Medicine

## 2015-02-21 ENCOUNTER — Inpatient Hospital Stay: Payer: Medicaid Other | Attending: Internal Medicine

## 2015-02-21 DIAGNOSIS — M79602 Pain in left arm: Secondary | ICD-10-CM | POA: Diagnosis not present

## 2015-02-21 DIAGNOSIS — I7410 Embolism and thrombosis of unspecified parts of aorta: Secondary | ICD-10-CM | POA: Insufficient documentation

## 2015-02-21 DIAGNOSIS — D735 Infarction of spleen: Secondary | ICD-10-CM | POA: Diagnosis not present

## 2015-02-21 DIAGNOSIS — R1012 Left upper quadrant pain: Secondary | ICD-10-CM | POA: Diagnosis not present

## 2015-02-21 DIAGNOSIS — F17210 Nicotine dependence, cigarettes, uncomplicated: Secondary | ICD-10-CM | POA: Diagnosis not present

## 2015-02-21 DIAGNOSIS — Z7901 Long term (current) use of anticoagulants: Secondary | ICD-10-CM | POA: Diagnosis not present

## 2015-02-21 DIAGNOSIS — D473 Essential (hemorrhagic) thrombocythemia: Secondary | ICD-10-CM | POA: Diagnosis not present

## 2015-02-21 LAB — PROTIME-INR
INR: 2.94
Prothrombin Time: 30.7 seconds — ABNORMAL HIGH (ref 11.4–15.0)

## 2015-02-22 ENCOUNTER — Telehealth: Payer: Self-pay | Admitting: *Deleted

## 2015-02-22 NOTE — Telephone Encounter (Signed)
-----   Message from Cammie Sickle, MD sent at 02/21/2015  4:18 PM EDT -----  Continue current dose of Coumadin; recheck PT/INR in 2 weeks. Please inform patient  ----- Message -----    From: [facility] [place]: 02/21/2015  11:25 AM      To: Cammie Sickle, MD

## 2015-02-22 NOTE — Telephone Encounter (Signed)
Left msg on patient's voice mail to have patient call cancer center with md recommendations. Patient needs to keep the same coumadin dosing. Coumadin 6 mg tablets daily orally on 5 times a week (Monday, Wednesday, Friday, Saturday and Sunday) and take the 3 mg tablet 2 days of week (Tuesday and Thursdays. I spoke with Dr. Rogue Bussing as he recommended a recheck in 2 weeks. Patient has an appointment next week on 02/28/15. MD states to have patient keep this appointment.

## 2015-02-23 NOTE — Telephone Encounter (Signed)
Spoke with patient 02/22/15 at 1600. Patient educated to keep same coumadin dosing. We will see her back in the office next week. Teach back process performed.

## 2015-02-28 ENCOUNTER — Telehealth: Payer: Self-pay | Admitting: Internal Medicine

## 2015-02-28 ENCOUNTER — Other Ambulatory Visit: Payer: Self-pay | Admitting: Internal Medicine

## 2015-02-28 ENCOUNTER — Inpatient Hospital Stay: Payer: Medicaid Other

## 2015-02-28 ENCOUNTER — Inpatient Hospital Stay (HOSPITAL_BASED_OUTPATIENT_CLINIC_OR_DEPARTMENT_OTHER): Payer: Medicaid Other | Admitting: Internal Medicine

## 2015-02-28 ENCOUNTER — Other Ambulatory Visit: Payer: Self-pay | Admitting: *Deleted

## 2015-02-28 ENCOUNTER — Encounter: Payer: Self-pay | Admitting: Internal Medicine

## 2015-02-28 VITALS — BP 126/90 | HR 60 | Temp 96.7°F | Resp 18 | Ht 67.0 in | Wt 146.6 lb

## 2015-02-28 DIAGNOSIS — D735 Infarction of spleen: Secondary | ICD-10-CM

## 2015-02-28 DIAGNOSIS — M7989 Other specified soft tissue disorders: Secondary | ICD-10-CM

## 2015-02-28 DIAGNOSIS — R1012 Left upper quadrant pain: Secondary | ICD-10-CM

## 2015-02-28 DIAGNOSIS — M79602 Pain in left arm: Secondary | ICD-10-CM

## 2015-02-28 DIAGNOSIS — D473 Essential (hemorrhagic) thrombocythemia: Secondary | ICD-10-CM

## 2015-02-28 DIAGNOSIS — I7410 Embolism and thrombosis of unspecified parts of aorta: Secondary | ICD-10-CM

## 2015-02-28 DIAGNOSIS — M79632 Pain in left forearm: Secondary | ICD-10-CM

## 2015-02-28 DIAGNOSIS — Z7901 Long term (current) use of anticoagulants: Secondary | ICD-10-CM

## 2015-02-28 DIAGNOSIS — F17210 Nicotine dependence, cigarettes, uncomplicated: Secondary | ICD-10-CM

## 2015-02-28 DIAGNOSIS — S40022A Contusion of left upper arm, initial encounter: Secondary | ICD-10-CM

## 2015-02-28 LAB — PROTIME-INR
INR: 2.32
Prothrombin Time: 25.2 seconds — ABNORMAL HIGH (ref 11.4–15.0)

## 2015-02-28 MED ORDER — OXYCODONE HCL 10 MG PO TABS: 10.0000 mg | ORAL_TABLET | Freq: Four times a day (QID) | ORAL | Status: DC | PRN

## 2015-02-28 NOTE — Progress Notes (Signed)
Pt has felt left arm at antecubital area 2 small knots under the skin that is sore to touch and bruising to area.  She still has pain in chest area and rating 5 today,   Needs pain med today

## 2015-02-28 NOTE — Progress Notes (Signed)
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 Patient is taking Coumadin 5 mg a day currently.  Patient complains of pain/and knot in the left arm area- the last few days. His painful as per patient. Denies any nosebleeds or gum bleeding.  Denies any falls.  Patient continues to complain of intermittent left upper quadrant pain; which is improved but not resolved. She continues to take oxycodone 10 mg every 6-8 hours.  REVIEW OF SYSTEMS:  A complete 10 point review of system is done which is negative except mentioned above/history of present illness.   PAST MEDICAL HISTORY :  Past Medical History  Diagnosis Date  . Aortic thrombus ([place])   . 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 No pertinent past surgical history.  FAMILY HISTORY :   Family History  Problem Relation Age of Onset  . Adopted: Yes    SOCIAL HISTORY:   Social History  Substance Use Topics  . Smoking status: Current Some Day Smoker -- 0.25 packs/day for 20 years    Types: Cigarettes  . Smokeless tobacco: Never Used  . Alcohol Use: 0.6 oz/week    0 Standard drinks or equivalent, 1 Cans of beer per week    ALLERGIES:  has No Known Allergies.  MEDICATIONS:  Current Outpatient Prescriptions  Medication Sig Dispense Refill  . Oxycodone HCl 10 MG TABS Take 1 tablet (10 mg total) by mouth every 6 (six) hours as needed. 120 tablet 0  . warfarin (COUMADIN) 5 MG tablet Take 5 mg by mouth daily. 1 pill on mon and tues. And  all other days of week is 2 pills.     No current facility-administered medications for this visit.    PHYSICAL EXAMINATION: ECOG PERFORMANCE STATUS: 0 - Asymptomatic  BP 126/90 mmHg  Pulse 60  Temp(Src) 96.7 F (35.9 C) (Tympanic)  Resp 18  Ht 5\' 7"  (1.702 m)  Wt 146 lb 9.7 oz (66.5 kg)  BMI 22.96 kg/m2  Filed Weights   02/28/15 1013  Weight: 146 lb 9.7 oz (66.5 kg)    GENERAL: Well-nourished well-developed; Alert, no distress and comfortable.   Alone.  EYES: no pallor or icterus OROPHARYNX: no thrush or ulceration; good dentition  NECK: supple, no masses felt LYMPH:  no palpable lymphadenopathy in the cervical, axillary or inguinal regions LUNGS: clear to auscultation and  No wheeze or crackles HEART/CVS: regular rate & rhythm and no murmurs; No lower extremity edema ABDOMEN:abdomen soft, tender and normal bowel sounds Musculoskeletal:no cyanosis of digits and no clubbing  PSYCH: alert & oriented x 3 with fluent speech NEURO: no focal motor/sensory deficits SKIN:  no rashes or significant lesions  LABORATORY DATA:  I have reviewed the data as listed    Component Value Date/Time   NA 137 01/17/2015 0347   NA 140 04/23/2011 1538   K 3.3* 01/17/2015 0347   K 3.9 04/23/2011 1538   CL 106 01/17/2015 0347   CL 105 04/23/2011 1538   CO2 24 01/17/2015 0347   CO2 25 04/23/2011  1538   GLUCOSE 90 01/17/2015 0347   GLUCOSE 117* 04/23/2011 1538   BUN 5* 01/17/2015 0347   BUN 2* 04/23/2011 1538   CREATININE 0.63 01/31/2015 1055   CREATININE 0.63 04/23/2011 1538   CALCIUM 7.7* 01/17/2015 0347   CALCIUM 8.9 04/23/2011 1538   PROT 7.5 01/31/2015 1055   PROT 7.9 04/23/2011 1538   ALBUMIN 3.8 01/31/2015 1055   ALBUMIN 3.5 04/23/2011 1538   AST 24 01/31/2015 1055   AST 21 04/23/2011 1538   ALT 16 01/31/2015 1055   ALT 17 04/23/2011 1538   ALKPHOS 72 01/31/2015 1055   ALKPHOS 73 04/23/2011 1538   BILITOT 0.3 01/31/2015 1055   BILITOT 0.4 04/23/2011 1538   GFRNONAA >60  01/31/2015 1055   GFRNONAA >60 04/23/2011 1538   GFRAA >60 01/31/2015 1055   GFRAA >60 04/23/2011 1538    No results found for: SPEP, UPEP  Lab Results  Component Value Date   WBC 11.2* 01/31/2015   NEUTROABS 5.8 01/31/2015   HGB 16.8* 01/31/2015   HCT 50.4* 01/31/2015   MCV 90.3 01/31/2015   PLT 741* 01/31/2015      Chemistry      Component Value Date/Time   NA 137 01/17/2015 0347   NA 140 04/23/2011 1538   K 3.3* 01/17/2015 0347   K 3.9 04/23/2011 1538   CL 106 01/17/2015 0347   CL 105 04/23/2011 1538   CO2 24 01/17/2015 0347   CO2 25 04/23/2011 1538   BUN 5* 01/17/2015 0347   BUN 2* 04/23/2011 1538   CREATININE 0.63 01/31/2015 1055   CREATININE 0.63 04/23/2011 1538      Component Value Date/Time   CALCIUM 7.7* 01/17/2015 0347   CALCIUM 8.9 04/23/2011 1538   ALKPHOS 72 01/31/2015 1055   ALKPHOS 73 04/23/2011 1538   AST 24 01/31/2015 1055   AST 21 04/23/2011 1538   ALT 16 01/31/2015 1055   ALT 17 04/23/2011 1538   BILITOT 0.3 01/31/2015 1055   BILITOT 0.4 04/23/2011 1538       RADIOGRAPHIC STUDIES: I have personally reviewed the radiological images as listed and agreed with the findings in the report. No results found.   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  As summarized above  Patient does not have any  Identifiable cause of her  Hypercoagulable state.  This was discussed at length with the patient.   Most recent PT/INR approximately from 7 days-2.9. INR awaiting from today. Follow-up testing based on INR from today.  # Pain from splenic infarction- improving not resolved.  A new prescription for oxycodone 10 mg every  8-12 hours  hours was given.   # Left upper extremity- approximately 1 cm swelling pain/tender area- question superficial thrombophlebitis versus others. Check an ultrasound of the upper extremity. If patient has a DVT- on therapeutic INR; we'll likely have to switch her to alternate anticoagulation like  xarelto.  #Thrombocytosis/740 platelets; hemoglobin 16/hematocrit 50- unclear etiology. I would recommend checking jack 2 mutation at next visit.  All questions were answered. The patient knows to call the clinic with any problems, questions or concerns. No barriers to learning was detected.  I spent 25  minutes counseling the patient face to face. The total time spent in the appointment was 30 minutes and more than 50% was on counseling and review of test results     Cammie Sickle, MD 02/28/2015 10:50 AM

## 2015-02-28 NOTE — Telephone Encounter (Signed)
Left a message for the patient that her INR is therapeutic at 2.3. Clinically I am okay having ultrasound of the left upper extremity to be done tomorrow evening.

## 2015-03-01 ENCOUNTER — Other Ambulatory Visit: Payer: Self-pay | Admitting: *Deleted

## 2015-03-01 ENCOUNTER — Ambulatory Visit
Admission: RE | Admit: 2015-03-01 | Discharge: 2015-03-01 | Disposition: A | Discharge status: Home / Self Care | Payer: Medicaid Other | Source: Ambulatory Visit | Attending: Internal Medicine | Admitting: Internal Medicine

## 2015-03-01 ENCOUNTER — Telehealth: Payer: Self-pay | Admitting: *Deleted

## 2015-03-01 DIAGNOSIS — I7410 Embolism and thrombosis of unspecified parts of aorta: Secondary | ICD-10-CM

## 2015-03-01 DIAGNOSIS — S40022A Contusion of left upper arm, initial encounter: Secondary | ICD-10-CM

## 2015-03-01 DIAGNOSIS — D735 Infarction of spleen: Secondary | ICD-10-CM

## 2015-03-01 DIAGNOSIS — M79602 Pain in left arm: Secondary | ICD-10-CM | POA: Diagnosis present

## 2015-03-01 DIAGNOSIS — M7989 Other specified soft tissue disorders: Secondary | ICD-10-CM | POA: Diagnosis not present

## 2015-03-01 DIAGNOSIS — R1012 Left upper quadrant pain: Secondary | ICD-10-CM

## 2015-03-01 DIAGNOSIS — D473 Essential (hemorrhagic) thrombocythemia: Secondary | ICD-10-CM

## 2015-03-01 MED ORDER — WARFARIN SODIUM 5 MG PO TABS: 5.0000 mg | ORAL_TABLET | Freq: Every day | ORAL | Status: DC

## 2015-03-01 NOTE — Telephone Encounter (Signed)
Called pt to let her know that is was thrombophlebitis from previous IV sticks in hosp.  It is not blood clot.  She should put warm compresses over the area 3-4 times a day.  Lay in on the area 10-15 min at a time.

## 2015-03-14 ENCOUNTER — Inpatient Hospital Stay: Payer: Medicaid Other

## 2015-03-19 ENCOUNTER — Inpatient Hospital Stay: Payer: Medicaid Other

## 2015-03-19 DIAGNOSIS — I7410 Embolism and thrombosis of unspecified parts of aorta: Secondary | ICD-10-CM

## 2015-03-19 DIAGNOSIS — D735 Infarction of spleen: Secondary | ICD-10-CM | POA: Diagnosis not present

## 2015-03-19 DIAGNOSIS — D473 Essential (hemorrhagic) thrombocythemia: Secondary | ICD-10-CM

## 2015-03-19 LAB — PROTIME-INR
INR: 3.96
Prothrombin Time: 37.7 seconds — ABNORMAL HIGH (ref 11.4–15.0)

## 2015-03-28 ENCOUNTER — Other Ambulatory Visit: Payer: Self-pay | Admitting: *Deleted

## 2015-03-28 ENCOUNTER — Telehealth: Payer: Self-pay | Admitting: Internal Medicine

## 2015-03-28 ENCOUNTER — Inpatient Hospital Stay: Payer: Medicaid Other | Attending: Internal Medicine

## 2015-03-28 DIAGNOSIS — R1012 Left upper quadrant pain: Secondary | ICD-10-CM

## 2015-03-28 DIAGNOSIS — D735 Infarction of spleen: Secondary | ICD-10-CM

## 2015-03-28 DIAGNOSIS — I7410 Embolism and thrombosis of unspecified parts of aorta: Secondary | ICD-10-CM

## 2015-03-28 LAB — PROTIME-INR
INR: 2.83
PROTHROMBIN TIME: 29.3 s — AB (ref 11.4–15.0)

## 2015-03-28 MED ORDER — OXYCODONE HCL 10 MG PO TABS: 10.0000 mg | ORAL_TABLET | Freq: Four times a day (QID) | ORAL | Status: DC | PRN

## 2015-03-28 NOTE — Telephone Encounter (Signed)
Informed that prescription is ready to pick up  

## 2015-03-28 NOTE — Telephone Encounter (Signed)
Called lab at 1645. Lab still showing "in process". Lab tech states that pt/inr did not report from the lab machine. She will attempt to rerun specimen and the report should result in the next 10 minutes. I gave her the provider's ascom and cell# in case of critical value.

## 2015-03-28 NOTE — Telephone Encounter (Signed)
Repeat PT/INR this week; and recommendation re: coumaidn to follow. Please inform pt.

## 2015-03-29 LAB — JAK2  V617F QUAL. WITH REFLEX TO EXON 12

## 2015-03-29 LAB — JAK2 EXON 12 MUTATION ANALYSIS

## 2015-03-30 ENCOUNTER — Telehealth: Payer: Self-pay | Admitting: *Deleted

## 2015-03-30 ENCOUNTER — Other Ambulatory Visit: Payer: Self-pay | Admitting: *Deleted

## 2015-03-30 DIAGNOSIS — I7410 Embolism and thrombosis of unspecified parts of aorta: Secondary | ICD-10-CM

## 2015-03-30 MED ORDER — WARFARIN SODIUM 3 MG PO TABS: 3.0000 mg | ORAL_TABLET | Freq: Once | ORAL | Status: DC

## 2015-03-30 NOTE — Telephone Encounter (Signed)
Called patient to ask how she is taking her coumadin?  Patient had called for refill and when she picked up prescription she realized it was different than what she had been taking.  Patient states she has been on 3 mg and takes one tablet Monday and Tuesday.  She takes two pills Wednesday through Sunday for a total of 6 mg on those days.  Informed her that we will take care of this and will give her a call back to let her know it has been done.

## 2015-03-30 NOTE — Telephone Encounter (Signed)
rx sent to pharmacy with correct dosing per v/o Dr. Rogue Bussing

## 2015-04-11 ENCOUNTER — Ambulatory Visit: Payer: Medicaid Other | Admitting: Internal Medicine

## 2015-04-11 ENCOUNTER — Other Ambulatory Visit: Payer: Medicaid Other

## 2015-04-12 ENCOUNTER — Ambulatory Visit
Admission: RE | Admit: 2015-04-12 | Discharge: 2015-04-12 | Disposition: A | Discharge status: Home / Self Care | Payer: Medicaid Other | Source: Ambulatory Visit | Attending: Vascular Surgery | Admitting: Vascular Surgery

## 2015-04-12 DIAGNOSIS — D735 Infarction of spleen: Secondary | ICD-10-CM | POA: Insufficient documentation

## 2015-04-12 DIAGNOSIS — I7411 Embolism and thrombosis of thoracic aorta: Secondary | ICD-10-CM | POA: Diagnosis not present

## 2015-04-12 DIAGNOSIS — I7409 Other arterial embolism and thrombosis of abdominal aorta: Secondary | ICD-10-CM | POA: Diagnosis present

## 2015-04-12 MED ORDER — IOHEXOL 350 MG/ML SOLN
100.0000 mL | Freq: Once | INTRAVENOUS | Status: AC | PRN
  Administered 2015-04-12: 100 mL via INTRAVENOUS

## 2015-04-13 ENCOUNTER — Ambulatory Visit: Payer: Medicaid Other

## 2015-04-13 ENCOUNTER — Other Ambulatory Visit: Payer: Medicaid Other

## 2015-04-18 ENCOUNTER — Other Ambulatory Visit: Payer: Self-pay | Admitting: Vascular Surgery

## 2015-04-18 DIAGNOSIS — I7411 Embolism and thrombosis of thoracic aorta: Secondary | ICD-10-CM

## 2015-04-27 ENCOUNTER — Telehealth: Payer: Self-pay | Admitting: *Deleted

## 2015-04-27 DIAGNOSIS — R1012 Left upper quadrant pain: Secondary | ICD-10-CM

## 2015-04-27 DIAGNOSIS — I7410 Embolism and thrombosis of unspecified parts of aorta: Secondary | ICD-10-CM

## 2015-04-27 MED ORDER — OXYCODONE HCL 10 MG PO TABS
10.0000 mg | ORAL_TABLET | Freq: Four times a day (QID) | ORAL | Status: DC | PRN
Start: 2015-04-27 — End: 2015-05-18

## 2015-04-27 NOTE — Telephone Encounter (Signed)
Informed that prescription is ready to pick up  

## 2015-05-02 ENCOUNTER — Inpatient Hospital Stay: Payer: Self-pay

## 2015-05-02 ENCOUNTER — Inpatient Hospital Stay: Payer: Self-pay | Admitting: Internal Medicine

## 2015-05-11 ENCOUNTER — Inpatient Hospital Stay: Payer: Medicaid Other

## 2015-05-11 ENCOUNTER — Inpatient Hospital Stay: Payer: Medicaid Other | Admitting: Internal Medicine

## 2015-05-18 ENCOUNTER — Telehealth: Payer: Self-pay | Admitting: Internal Medicine

## 2015-05-18 ENCOUNTER — Inpatient Hospital Stay: Payer: Medicaid Other

## 2015-05-18 ENCOUNTER — Inpatient Hospital Stay: Payer: Medicaid Other | Attending: Internal Medicine | Admitting: Internal Medicine

## 2015-05-18 ENCOUNTER — Encounter: Payer: Self-pay | Admitting: Internal Medicine

## 2015-05-18 VITALS — BP 160/104 | HR 72 | Temp 98.1°F | Resp 18 | Wt 147.4 lb

## 2015-05-18 DIAGNOSIS — F17210 Nicotine dependence, cigarettes, uncomplicated: Secondary | ICD-10-CM | POA: Diagnosis not present

## 2015-05-18 DIAGNOSIS — D735 Infarction of spleen: Secondary | ICD-10-CM | POA: Insufficient documentation

## 2015-05-18 DIAGNOSIS — I7410 Embolism and thrombosis of unspecified parts of aorta: Secondary | ICD-10-CM

## 2015-05-18 DIAGNOSIS — R1012 Left upper quadrant pain: Secondary | ICD-10-CM | POA: Diagnosis not present

## 2015-05-18 DIAGNOSIS — Z7901 Long term (current) use of anticoagulants: Secondary | ICD-10-CM | POA: Diagnosis not present

## 2015-05-18 LAB — PROTIME-INR
INR: 3.79
Prothrombin Time: 36.5 seconds — ABNORMAL HIGH (ref 11.4–15.0)

## 2015-05-18 MED ORDER — OXYCODONE HCL 10 MG PO TABS
10.0000 mg | ORAL_TABLET | Freq: Four times a day (QID) | ORAL | Status: DC | PRN
Start: 2015-05-18 — End: 2015-06-18

## 2015-05-18 MED ORDER — TRAMADOL HCL 50 MG PO TABS: 50.0000 mg | ORAL_TABLET | Freq: Two times a day (BID) | ORAL | Status: DC | PRN

## 2015-05-18 NOTE — Progress Notes (Signed)
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 Patient is taking Coumadin 3mg - mon/tues & 6 mg wed-Frid. In the interim she has followed up with vascular surgery.   Patient continues to complain of intense pain in the left upper quadrant; however this is improved since initial blood clot in September 2016. She is currently on oxycodone 10 mg every 6-8 hours. Makes worse with movement. She denies any tingling and numbness of further extremities.  She denies any bleeding gums or bleeding nose. She is worried about a tooth that needs to be extracted.   REVIEW OF SYSTEMS:  A complete 10 point review of system is done which is negative except mentioned above/history of present illness.   PAST MEDICAL HISTORY :  Past Medical History  Diagnosis Date  . Aortic thrombus ([place])   . 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  FAMILY HISTORY :   Family History  Problem Relation Age of Onset  . Adopted: Yes    SOCIAL HISTORY:   Social History  Substance Use Topics  . Smoking status: Current Some Day Smoker -- 0.25 packs/day for 20 years    Types: Cigarettes  . Smokeless tobacco: Never Used  . Alcohol Use: 0.6 oz/week    0 Standard drinks or equivalent, 1 Cans of beer per week    ALLERGIES:  has No Known Allergies.  MEDICATIONS:  Current Outpatient Prescriptions  Medication Sig Dispense Refill  . Oxycodone HCl 10 MG TABS Take 1 tablet (10 mg total) by mouth every 6 (six) hours as  needed. 120 tablet 0  . warfarin (COUMADIN) 3 MG tablet Take 1 tablet (3 mg total) by mouth one time only at 6 PM. 1 tablet on Monday and Tuesday; 2 tablets Wednesday-Sunday 60 tablet 6   No current facility-administered medications for this visit.    PHYSICAL EXAMINATION: ECOG PERFORMANCE STATUS: 0 - Asymptomatic  BP 160/104 mmHg  Pulse 72  Temp(Src) 98.1 F (36.7 C) (Tympanic)  Resp 18  Wt 147 lb 6 oz (66.85 kg)  Filed Weights   05/18/15 0917  Weight: 147 lb 6 oz (66.85 kg)    GENERAL: Well-nourished well-developed; Alert, no distress and comfortable.   Alone.  EYES: no pallor or icterus OROPHARYNX: no thrush or ulceration; good dentition  NECK: supple, no masses felt LYMPH:  no palpable lymphadenopathy in the cervical, axillary or inguinal regions LUNGS: clear to auscultation and  No wheeze or crackles HEART/CVS: regular rate & rhythm and no murmurs; No lower extremity edema ABDOMEN:abdomen soft, tender and normal bowel sounds Musculoskeletal:no cyanosis of digits and no clubbing  PSYCH: alert & oriented x 3 with fluent speech NEURO: no focal motor/sensory deficits SKIN:  no rashes or significant lesions  LABORATORY DATA:  I have reviewed the data as listed    Component Value Date/Time   NA 137 01/17/2015 0347   NA 140 04/23/2011 1538   K 3.3* 01/17/2015 0347   K 3.9 04/23/2011 1538   CL 106 01/17/2015 0347  CL 105 04/23/2011 1538   CO2 24 01/17/2015 0347   CO2 25 04/23/2011 1538   GLUCOSE 90 01/17/2015 0347   GLUCOSE 117* 04/23/2011 1538   BUN 5* 01/17/2015 0347   BUN 2* 04/23/2011 1538   CREATININE 0.63 01/31/2015 1055   CREATININE 0.63 04/23/2011 1538   CALCIUM 7.7* 01/17/2015 0347   CALCIUM 8.9 04/23/2011 1538   PROT 7.5 01/31/2015 1055   PROT 7.9 04/23/2011 1538   ALBUMIN 3.8 01/31/2015 1055   ALBUMIN 3.5 04/23/2011 1538   AST 24 01/31/2015 1055   AST 21 04/23/2011 1538   ALT 16 01/31/2015 1055   ALT 17 04/23/2011 1538   ALKPHOS 72 01/31/2015  1055   ALKPHOS 73 04/23/2011 1538   BILITOT 0.3 01/31/2015 1055   BILITOT 0.4 04/23/2011 1538   GFRNONAA >60 01/31/2015 1055   GFRNONAA >60 04/23/2011 1538   GFRAA >60 01/31/2015 1055   GFRAA >60 04/23/2011 1538    No results found for: SPEP, UPEP  Lab Results  Component Value Date   WBC 11.2* 01/31/2015   NEUTROABS 5.8 01/31/2015   HGB 16.8* 01/31/2015   HCT 50.4* 01/31/2015   MCV 90.3 01/31/2015   PLT 741* 01/31/2015      Chemistry      Component Value Date/Time   NA 137 01/17/2015 0347   NA 140 04/23/2011 1538   K 3.3* 01/17/2015 0347   K 3.9 04/23/2011 1538   CL 106 01/17/2015 0347   CL 105 04/23/2011 1538   CO2 24 01/17/2015 0347   CO2 25 04/23/2011 1538   BUN 5* 01/17/2015 0347   BUN 2* 04/23/2011 1538   CREATININE 0.63 01/31/2015 1055   CREATININE 0.63 04/23/2011 1538      Component Value Date/Time   CALCIUM 7.7* 01/17/2015 0347   CALCIUM 8.9 04/23/2011 1538   ALKPHOS 72 01/31/2015 1055   ALKPHOS 73 04/23/2011 1538   AST 24 01/31/2015 1055   AST 21 04/23/2011 1538   ALT 16 01/31/2015 1055   ALT 17 04/23/2011 1538   BILITOT 0.3 01/31/2015 1055   BILITOT 0.4 04/23/2011 1538       RADIOGRAPHIC STUDIES: I have personally reviewed the radiological images as listed and agreed with the findings in the report. No results found.   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  As summarized above  Patient does not have any  Identifiable cause of her Hypercoagulable state.  Patient's CT scan in January 2017- improved resolution of the aorta blood clot. Continued changes of splenic infarction. She has another CT scan planned in March 2017 [ordered through vascular surgery.]   Most recent PT/INR approximately from 1 month ago was therapeutic 2.9. INR awaiting from today. Follow-up testing based on INR from today. Patient will need long-term anticoagulation   # Pain from splenic infarction- improving not resolved;  A new prescription  for oxycodone 10 mg every  8-12 hours  hours was given. Also added tramadol 50 mg twice a day   # Follow-up in 2 months CBC CMP PT/INR.  # 15 minutes face-to-face with the patient discussing the above plan of care; more than 50% of time spent on prognosis/ natural history; counseling and coordination.      Cammie Sickle, MD 05/18/2015 9:42 AM

## 2015-05-18 NOTE — Telephone Encounter (Signed)
Please inform patient- Take Coumadin 6 mg Monday,Tuesday, Wednesday; and then 3 mg Thursday Friday Saturday Sunday. Recheck PT/INR in 2 weeks.

## 2015-05-18 NOTE — Telephone Encounter (Signed)
Pt informed on new coumadin dosing. Coumadin flowsheet entered. Pt will come back in 2 weeks for pt/inr check. Apt given for 06/01/15 at 830 am.

## 2015-05-18 NOTE — Addendum Note (Signed)
Addended by: Sabino Gasser on: 05/18/2015 04:47 PM   Modules accepted: Orders

## 2015-06-01 ENCOUNTER — Inpatient Hospital Stay: Payer: Medicaid Other | Attending: Internal Medicine

## 2015-06-18 ENCOUNTER — Other Ambulatory Visit: Payer: Self-pay | Admitting: Internal Medicine

## 2015-06-18 ENCOUNTER — Other Ambulatory Visit: Payer: Self-pay | Admitting: *Deleted

## 2015-06-18 DIAGNOSIS — I7410 Embolism and thrombosis of unspecified parts of aorta: Secondary | ICD-10-CM

## 2015-06-18 DIAGNOSIS — R1012 Left upper quadrant pain: Secondary | ICD-10-CM

## 2015-06-18 MED ORDER — TRAMADOL HCL 50 MG PO TABS
50.0000 mg | ORAL_TABLET | Freq: Two times a day (BID) | ORAL | Status: DC | PRN
Start: 2015-06-18 — End: 2015-10-05

## 2015-06-18 MED ORDER — OXYCODONE HCL 10 MG PO TABS: 10.0000 mg | ORAL_TABLET | Freq: Four times a day (QID) | ORAL | Status: DC | PRN

## 2015-07-10 ENCOUNTER — Ambulatory Visit: Admission: RE | Admit: 2015-07-10 | Payer: Medicaid Other | Source: Ambulatory Visit

## 2015-07-11 ENCOUNTER — Other Ambulatory Visit: Payer: Self-pay | Admitting: Vascular Surgery

## 2015-07-11 DIAGNOSIS — I7411 Embolism and thrombosis of thoracic aorta: Secondary | ICD-10-CM

## 2015-07-16 ENCOUNTER — Inpatient Hospital Stay: Payer: Medicaid Other | Admitting: Internal Medicine

## 2015-07-16 ENCOUNTER — Inpatient Hospital Stay: Payer: Medicaid Other

## 2015-07-19 ENCOUNTER — Ambulatory Visit
Admission: RE | Admit: 2015-07-19 | Discharge: 2015-07-19 | Disposition: A | Discharge status: Home / Self Care | Payer: Medicaid Other | Source: Ambulatory Visit | Attending: Vascular Surgery | Admitting: Vascular Surgery

## 2015-07-19 DIAGNOSIS — I7411 Embolism and thrombosis of thoracic aorta: Secondary | ICD-10-CM

## 2015-07-19 DIAGNOSIS — R1012 Left upper quadrant pain: Secondary | ICD-10-CM | POA: Diagnosis present

## 2015-07-19 DIAGNOSIS — I82B22 Chronic embolism and thrombosis of left subclavian vein: Secondary | ICD-10-CM | POA: Insufficient documentation

## 2015-07-19 DIAGNOSIS — I7409 Other arterial embolism and thrombosis of abdominal aorta: Secondary | ICD-10-CM | POA: Diagnosis not present

## 2015-07-19 MED ORDER — IOPAMIDOL (ISOVUE-370) INJECTION 76%
100.0000 mL | Freq: Once | INTRAVENOUS | Status: AC | PRN
  Administered 2015-07-19: 100 mL via INTRAVENOUS

## 2015-07-23 ENCOUNTER — Other Ambulatory Visit: Payer: Self-pay | Admitting: *Deleted

## 2015-07-23 DIAGNOSIS — I7410 Embolism and thrombosis of unspecified parts of aorta: Secondary | ICD-10-CM

## 2015-07-23 DIAGNOSIS — R1012 Left upper quadrant pain: Secondary | ICD-10-CM

## 2015-07-23 MED ORDER — OXYCODONE HCL 10 MG PO TABS
10.0000 mg | ORAL_TABLET | Freq: Four times a day (QID) | ORAL | Status: DC | PRN
Start: 2015-07-23 — End: 2015-08-03

## 2015-07-23 NOTE — Telephone Encounter (Signed)
Requests a refill on her Oxycodone stating that the Tramadol makes her nauseated

## 2015-07-23 NOTE — Telephone Encounter (Signed)
OK to fill a 2 weeks  Supply per VO Dr Rogue Bussing, Per patient, she did not come to her last appt due to having an emergent CT and that [facility] office was to call us to let us know she was not coming. She does have an appt with Dr B on 4/14 @ 10AM

## 2015-07-25 ENCOUNTER — Other Ambulatory Visit: Payer: Self-pay | Admitting: Vascular Surgery

## 2015-07-25 DIAGNOSIS — I7411 Embolism and thrombosis of thoracic aorta: Secondary | ICD-10-CM

## 2015-08-02 ENCOUNTER — Other Ambulatory Visit: Payer: Medicaid Other

## 2015-08-02 ENCOUNTER — Ambulatory Visit: Payer: Medicaid Other | Admitting: Internal Medicine

## 2015-08-03 ENCOUNTER — Inpatient Hospital Stay: Payer: Medicaid Other | Attending: Internal Medicine

## 2015-08-03 ENCOUNTER — Inpatient Hospital Stay (HOSPITAL_BASED_OUTPATIENT_CLINIC_OR_DEPARTMENT_OTHER): Payer: Medicaid Other | Admitting: Internal Medicine

## 2015-08-03 VITALS — BP 142/96 | HR 71 | Temp 98.1°F | Wt 138.3 lb

## 2015-08-03 DIAGNOSIS — Z7901 Long term (current) use of anticoagulants: Secondary | ICD-10-CM | POA: Diagnosis not present

## 2015-08-03 DIAGNOSIS — F17210 Nicotine dependence, cigarettes, uncomplicated: Secondary | ICD-10-CM

## 2015-08-03 DIAGNOSIS — R1012 Left upper quadrant pain: Secondary | ICD-10-CM

## 2015-08-03 DIAGNOSIS — D735 Infarction of spleen: Secondary | ICD-10-CM

## 2015-08-03 DIAGNOSIS — I7410 Embolism and thrombosis of unspecified parts of aorta: Secondary | ICD-10-CM | POA: Diagnosis not present

## 2015-08-03 LAB — CBC WITH DIFFERENTIAL/PLATELET
Basophils Absolute: 0.1 10*3/uL (ref 0–0.1)
Basophils Relative: 1 %
EOS PCT: 4 %
Eosinophils Absolute: 0.3 10*3/uL (ref 0–0.7)
HCT: 49.9 % — ABNORMAL HIGH (ref 35.0–47.0)
HEMOGLOBIN: 17.5 g/dL — AB (ref 12.0–16.0)
LYMPHS ABS: 2.7 10*3/uL (ref 1.0–3.6)
LYMPHS PCT: 36 %
MCH: 33.8 pg (ref 26.0–34.0)
MCHC: 35 g/dL (ref 32.0–36.0)
MCV: 96.4 fL (ref 80.0–100.0)
MONOS PCT: 10 %
Monocytes Absolute: 0.7 10*3/uL (ref 0.2–0.9)
NEUTROS PCT: 49 %
Neutro Abs: 3.6 10*3/uL (ref 1.4–6.5)
Platelets: 380 10*3/uL (ref 150–440)
RBC: 5.18 MIL/uL (ref 3.80–5.20)
RDW: 14.1 % (ref 11.5–14.5)
WBC: 7.4 10*3/uL (ref 3.6–11.0)

## 2015-08-03 LAB — COMPREHENSIVE METABOLIC PANEL
ALK PHOS: 84 U/L (ref 38–126)
ALT: 21 U/L (ref 14–54)
AST: 35 U/L (ref 15–41)
Albumin: 3.8 g/dL (ref 3.5–5.0)
Anion gap: 6 (ref 5–15)
BUN: 6 mg/dL (ref 6–20)
CALCIUM: 8.3 mg/dL — AB (ref 8.9–10.3)
CO2: 21 mmol/L — ABNORMAL LOW (ref 22–32)
CREATININE: 0.77 mg/dL (ref 0.44–1.00)
Chloride: 106 mmol/L (ref 101–111)
Glucose, Bld: 130 mg/dL — ABNORMAL HIGH (ref 65–99)
Potassium: 3.6 mmol/L (ref 3.5–5.1)
Sodium: 133 mmol/L — ABNORMAL LOW (ref 135–145)
TOTAL PROTEIN: 7.4 g/dL (ref 6.5–8.1)
Total Bilirubin: 0.3 mg/dL (ref 0.3–1.2)

## 2015-08-03 LAB — PROTIME-INR
INR: 2.97
PROTHROMBIN TIME: 30.4 s — AB (ref 11.4–15.0)

## 2015-08-03 MED ORDER — OXYCODONE HCL 10 MG PO TABS: 10.0000 mg | ORAL_TABLET | Freq: Three times a day (TID) | ORAL | Status: DC | PRN

## 2015-08-03 NOTE — Progress Notes (Signed)
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 Patient had a recent CT scan with vastus surgery that showed- improving/healing splenic infarct; stable mural thrombus in the thoracic and abdominal aorta.   Patient is taking Coumadin 3mg - mon/tues & 6 mg wed-Frid. In the interim she has followed up with vascular surgery.   Patient continues to complain of intense pain in the left upper quadrant; however this is improved since initial blood clot in September 2016. She is currently on oxycodone 10 mg every 6-8 hours. Makes worse with movement. She denies any tingling and numbness of further extremities....  She denies any bleeding gums or bleeding nose. She is worried about a tooth that needs to be extracted.   REVIEW OF SYSTEMS:  A complete 10 point review of system is done which is negative except mentioned above/history of present illness.   PAST MEDICAL HISTORY :  Past Medical History  Diagnosis Date  . Aortic thrombus ([place])   . 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  FAMILY HISTORY :   Family History  Problem Relation Age of Onset  . Adopted: Yes    SOCIAL HISTORY:   Social History  Substance Use Topics  . Smoking status: Current Some Day Smoker -- 0.25 packs/day for 20 years    Types: Cigarettes  . Smokeless tobacco: Never Used  . Alcohol Use: 0.6 oz/week    0 Standard drinks or equivalent, 1 Cans of beer per week    ALLERGIES:  has No Known  Allergies.  MEDICATIONS:  Current Outpatient Prescriptions  Medication Sig Dispense Refill  . Oxycodone HCl 10 MG TABS Take 1 tablet (10 mg total) by mouth every 6 (six) hours as needed. 60 tablet 0  . warfarin (COUMADIN) 3 MG tablet Take 1 tablet (3 mg total) by mouth one time only at 6 PM. 1 tablet on Monday and Tuesday; 2 tablets Wednesday-Sunday 60 tablet 6  . traMADol (ULTRAM) 50 MG tablet Take 1 tablet (50 mg total) by mouth every 12 (twelve) hours as needed. (Patient not taking: Reported on 08/03/2015) 60 tablet 0   No current facility-administered medications for this visit.    PHYSICAL EXAMINATION: ECOG PERFORMANCE STATUS: 0 - Asymptomatic  BP 142/96 mmHg  Pulse 71  Temp(Src) 98.1 F (36.7 C) (Oral)  Wt 138 lb 5.4 oz (62.75 kg)  LMP 06/25/2015  Filed Weights   08/03/15 1125  Weight: 138 lb 5.4 oz (62.75 kg)    GENERAL: Well-nourished well-developed; Alert, no distress and comfortable.   Alone.  EYES: no pallor or icterus OROPHARYNX: no thrush or ulceration; good dentition  NECK: supple, no masses felt LYMPH:  no palpable lymphadenopathy in the cervical, axillary or inguinal regions LUNGS: clear to auscultation and  No wheeze or crackles HEART/CVS: regular rate & rhythm and no murmurs; No lower extremity edema ABDOMEN:abdomen soft, tender and normal bowel sounds Musculoskeletal:no cyanosis of digits and no clubbing  PSYCH: alert & oriented x 3 with fluent speech NEURO: no focal motor/sensory deficits SKIN:  no rashes or significant lesions  LABORATORY DATA:  I have reviewed the data as listed    Component Value Date/Time   NA 133* 08/03/2015 1046   NA 140 04/23/2011 1538   K 3.6 08/03/2015 1046   K 3.9 04/23/2011 1538   CL 106 08/03/2015 1046   CL 105 04/23/2011 1538   CO2 21* 08/03/2015 1046   CO2 25 04/23/2011 1538   GLUCOSE 130* 08/03/2015 1046   GLUCOSE 117* 04/23/2011 1538   BUN 6 08/03/2015 1046   BUN 2* 04/23/2011 1538   CREATININE 0.77 08/03/2015  1046   CREATININE 0.63 04/23/2011 1538   CALCIUM 8.3* 08/03/2015 1046   CALCIUM 8.9 04/23/2011 1538   PROT 7.4 08/03/2015 1046   PROT 7.9 04/23/2011 1538   ALBUMIN 3.8 08/03/2015 1046   ALBUMIN 3.5 04/23/2011 1538   AST 35 08/03/2015 1046   AST 21 04/23/2011 1538   ALT 21 08/03/2015 1046   ALT 17 04/23/2011 1538   ALKPHOS 84 08/03/2015 1046   ALKPHOS 73 04/23/2011 1538   BILITOT 0.3 08/03/2015 1046   BILITOT 0.4 04/23/2011 1538   GFRNONAA >60 08/03/2015 1046   GFRNONAA >60 04/23/2011 1538   GFRAA >60 08/03/2015 1046   GFRAA >60 04/23/2011 1538    No results found for: SPEP, UPEP  Lab Results  Component Value Date   WBC 7.4 08/03/2015   NEUTROABS 3.6 08/03/2015   HGB 17.5* 08/03/2015   HCT 49.9* 08/03/2015   MCV 96.4 08/03/2015   PLT 380 08/03/2015      Chemistry      Component Value Date/Time   NA 133* 08/03/2015 1046   NA 140 04/23/2011 1538   K 3.6 08/03/2015 1046   K 3.9 04/23/2011 1538   CL 106 08/03/2015 1046   CL 105 04/23/2011 1538   CO2 21* 08/03/2015 1046   CO2 25 04/23/2011 1538   BUN 6 08/03/2015 1046   BUN 2* 04/23/2011 1538   CREATININE 0.77 08/03/2015 1046   CREATININE 0.63 04/23/2011 1538      Component Value Date/Time   CALCIUM 8.3* 08/03/2015 1046   CALCIUM 8.9 04/23/2011 1538   ALKPHOS 84 08/03/2015 1046   ALKPHOS 73 04/23/2011 1538   AST 35 08/03/2015 1046   AST 21 04/23/2011 1538   ALT 21 08/03/2015 1046   ALT 17 04/23/2011 1538   BILITOT 0.3 08/03/2015 1046   BILITOT 0.4 04/23/2011 1538     IMPRESSION: 1. No new infarct or intraluminal thrombus. 2. 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 3. Stable eccentric mural thrombus in the distal descending thoracic and distal abdominal segments of the aorta. 4. Interval evolution of previously noted splenic infarcts.  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  Patient does not have any  identifiable cause of her  Hypercoagulable state. CT scan April 2017-chronic mural  thrombus thoracic aorta/healing splenic infarcts. Patient is a candidate for lifelong anticoagulation. Continue Coumadin 10 mg Monday Tuesday Wednesday; and 5 mg on Thursday Friday Saturday Sunday. Today INR is 2.9.   # Pain from splenic infarction- improving ;  Patient did not tolerate Tramadol. Discussed with the patient that her pain should start improving as the scans show improvement on the splenic infarcts. If she continues to have pain issues she will have to go to the pain clinic for further evaluation. I told her that I would be slowly be weaning off her pain medication. A new prescription for oxycodone 10  mg every  8  hours  hours was given. Plan to prescribe oxycodone 5 mg every 8 hours moving forward.  # Monthly PT/INR follow-up in 4 months with PT/INR H&H     Cammie Sickle, MD 08/03/2015 11:44 AM

## 2015-08-03 NOTE — Patient Instructions (Signed)
Smoking Cessation, Tips for Success If you are ready to quit smoking, congratulations! You have chosen to help yourself be healthier. Cigarettes bring nicotine, tar, carbon monoxide, and other irritants into your body. Your lungs, heart, and blood vessels will be able to work better without these poisons. There are many different ways to quit smoking. Nicotine gum, nicotine patches, a nicotine inhaler, or nicotine nasal spray can help with physical craving. Hypnosis, support groups, and medicines help break the habit of smoking. WHAT THINGS CAN I DO TO MAKE QUITTING EASIER?  Here are some tips to help you quit for good:  Pick a date when you will quit smoking completely. Tell all of your friends and family about your plan to quit on that date.  Do not try to slowly cut down on the number of cigarettes you are smoking. Pick a quit date and quit smoking completely starting on that day.  Throw away all cigarettes.   Clean and remove all ashtrays from your home, work, and car.  On a card, write down your reasons for quitting. Carry the card with you and read it when you get the urge to smoke.  Cleanse your body of nicotine. Drink enough water and fluids to keep your urine clear or pale yellow. Do this after quitting to flush the nicotine from your body.  Learn to predict your moods. Do not let a bad situation be your excuse to have a cigarette. Some situations in your life might tempt you into wanting a cigarette.  Never have "just one" cigarette. It leads to wanting another and another. Remind yourself of your decision to quit.  Change habits associated with smoking. If you smoked while driving or when feeling stressed, try other activities to replace smoking. Stand up when drinking your coffee. Brush your teeth after eating. Sit in a different chair when you read the paper. Avoid alcohol while trying to quit, and try to drink fewer caffeinated beverages. Alcohol and caffeine may urge you to  smoke.  Avoid foods and drinks that can trigger a desire to smoke, such as sugary or spicy foods and alcohol.  Ask people who smoke not to smoke around you.  Have something planned to do right after eating or having a cup of coffee. For example, plan to take a walk or exercise.  Try a relaxation exercise to calm you down and decrease your stress. Remember, you may be tense and nervous for the first 2 weeks after you quit, but this will pass.  Find new activities to keep your hands busy. Play with a pen, coin, or rubber band. Doodle or draw things on paper.  Brush your teeth right after eating. This will help cut down on the craving for the taste of tobacco after meals. You can also try mouthwash.   Use oral substitutes in place of cigarettes. Try using lemon drops, carrots, cinnamon sticks, or chewing gum. Keep them handy so they are available when you have the urge to smoke.  When you have the urge to smoke, try deep breathing.  Designate your home as a nonsmoking area.  If you are a heavy smoker, ask your health care provider about a prescription for nicotine chewing gum. It can ease your withdrawal from nicotine.  Reward yourself. Set aside the cigarette money you save and buy yourself something nice.  Look for support from others. Join a support group or smoking cessation program. Ask someone at home or at work to help you with your plan   to quit smoking.  Always ask yourself, "Do I need this cigarette or is this just a reflex?" Tell yourself, "Today, I choose not to smoke," or "I do not want to smoke." You are reminding yourself of your decision to quit.  Do not replace cigarette smoking with electronic cigarettes (commonly called e-cigarettes). The safety of e-cigarettes is unknown, and some may contain harmful chemicals.  If you relapse, do not give up! Plan ahead and think about what you will do the next time you get the urge to smoke. HOW WILL I FEEL WHEN I QUIT SMOKING? You  may have symptoms of withdrawal because your body is used to nicotine (the addictive substance in cigarettes). You may crave cigarettes, be irritable, feel very hungry, cough often, get headaches, or have difficulty concentrating. The withdrawal symptoms are only temporary. They are strongest when you first quit but will go away within 10-14 days. When withdrawal symptoms occur, stay in control. Think about your reasons for quitting. Remind yourself that these are signs that your body is healing and getting used to being without cigarettes. Remember that withdrawal symptoms are easier to treat than the major diseases that smoking can cause.  Even after the withdrawal is over, expect periodic urges to smoke. However, these cravings are generally short lived and will go away whether you smoke or not. Do not smoke! WHAT RESOURCES ARE AVAILABLE TO HELP ME QUIT SMOKING? Your health care provider can direct you to community resources or hospitals for support, which may include:  Group support.  Education.  Hypnosis.  Therapy.   This information is not intended to replace advice given to you by your health care provider. Make sure you discuss any questions you have with your health care provider.   Document Released: 01/04/2004 Document Revised: 04/28/2014 Document Reviewed: 09/23/2012 Elsevier Interactive Patient Education 2016 [employer]  

## 2015-08-03 NOTE — Progress Notes (Signed)
Patient ambulates without assistance, vitals documented.  Medication record updated, information provided by patient.

## 2015-08-22 ENCOUNTER — Telehealth: Payer: Self-pay | Admitting: *Deleted

## 2015-08-22 NOTE — Telephone Encounter (Signed)
Called to inquire when the next refill of her Oxycodone would be since the change up last week. After reading Dr B note I called her and told her that her wrote for a 90 day supply on the 14th adn the soonest she would be able to get another refill will be on the 12 th since the 14 th falls on a Sunday and that according to his note, the dose will be cut down to 5 mg from the current 10 mg. She replied "what? Whoa" then again asked when she will be able to pick up new prescription since we are not opened on Saturdays, I again told her she can call me on the 12 th and come pick it up at that time

## 2015-08-31 ENCOUNTER — Other Ambulatory Visit: Payer: Self-pay | Admitting: *Deleted

## 2015-08-31 DIAGNOSIS — I7410 Embolism and thrombosis of unspecified parts of aorta: Secondary | ICD-10-CM

## 2015-08-31 DIAGNOSIS — R1012 Left upper quadrant pain: Secondary | ICD-10-CM

## 2015-08-31 MED ORDER — OXYCODONE HCL 10 MG PO TABS: 10.0000 mg | ORAL_TABLET | Freq: Three times a day (TID) | ORAL | Status: DC | PRN

## 2015-09-03 ENCOUNTER — Inpatient Hospital Stay: Payer: Medicaid Other | Attending: Internal Medicine

## 2015-10-04 ENCOUNTER — Inpatient Hospital Stay: Payer: Medicaid Other | Attending: Internal Medicine

## 2015-10-04 DIAGNOSIS — I7410 Embolism and thrombosis of unspecified parts of aorta: Secondary | ICD-10-CM | POA: Insufficient documentation

## 2015-10-05 ENCOUNTER — Other Ambulatory Visit: Payer: Self-pay | Admitting: *Deleted

## 2015-10-05 ENCOUNTER — Other Ambulatory Visit: Payer: Self-pay | Admitting: Internal Medicine

## 2015-10-05 DIAGNOSIS — G8929 Other chronic pain: Secondary | ICD-10-CM

## 2015-10-05 DIAGNOSIS — R1012 Left upper quadrant pain: Principal | ICD-10-CM

## 2015-10-05 MED ORDER — OXYCODONE HCL 5 MG PO TABS: 5.0000 mg | ORAL_TABLET | Freq: Three times a day (TID) | ORAL | Status: DC | PRN

## 2015-10-05 NOTE — Telephone Encounter (Signed)
Unable to come in today for lab check but agrees to Monday 8:15 AM appt Instructed to go to ER if gets worse over weekend

## 2015-10-05 NOTE — Telephone Encounter (Addendum)
Per Dr Rogue Bussing, Oxycodone 5 mg q 8 h PRN #90 tabs, Refer to Pain clinic. I called patient to inform of Dr Aletha Halim orders and she was fine with that. She then informed me that she coughed up some bright red blood this morning when she got off of an airplane. Current Warfarin dose is 3 mg Mon and Tues,  6 mg Wed - Sun

## 2015-10-08 ENCOUNTER — Inpatient Hospital Stay: Payer: Medicaid Other

## 2015-10-08 DIAGNOSIS — I7410 Embolism and thrombosis of unspecified parts of aorta: Secondary | ICD-10-CM

## 2015-10-08 DIAGNOSIS — D735 Infarction of spleen: Secondary | ICD-10-CM

## 2015-10-08 LAB — PROTIME-INR
INR: 1.38
PROTHROMBIN TIME: 17.1 s — AB (ref 11.4–15.0)

## 2015-10-09 ENCOUNTER — Telehealth: Payer: Self-pay | Admitting: *Deleted

## 2015-10-09 NOTE — Telephone Encounter (Signed)
Spoke with md-md would like patient keep lab only on 11/02/15 at 915 am as previously scheduled.

## 2015-10-09 NOTE — Telephone Encounter (Signed)
-----   Message from Cammie Sickle, MD sent at 10/09/2015 11:22 AM EDT ----- INR- not therapeutic; Please check with pt re: dosing of coumadin. We will have to adjust her dosing- Thx

## 2015-10-09 NOTE — Telephone Encounter (Signed)
Pt called back to cancer center. Pt taking 6 mg (3 mg tablets) on Monday, Tuesday, Wednesday and 3 mg of coumadin on Thursday, Friday, Sat and Sunday.  Pt instructed to take 6 mg of coumadin Monday-Friday and 3 mg on Sat & Sunday.  Teach back process performed with patient.

## 2015-10-09 NOTE — Telephone Encounter (Signed)
Called patient and LVM to keep appointment for 11-02-15.

## 2015-10-09 NOTE — Telephone Encounter (Signed)
Called patient, however, had to leave VM to have patient call us back when she receives our message.  Instructed patient to call back to [facility] if she receives call before 4:30 today and if not she should call [facility] tomorrow.

## 2015-11-02 ENCOUNTER — Other Ambulatory Visit: Payer: Self-pay | Admitting: *Deleted

## 2015-11-02 ENCOUNTER — Telehealth: Payer: Self-pay | Admitting: *Deleted

## 2015-11-02 ENCOUNTER — Inpatient Hospital Stay: Payer: Medicaid Other | Attending: Internal Medicine

## 2015-11-02 DIAGNOSIS — D735 Infarction of spleen: Secondary | ICD-10-CM

## 2015-11-02 DIAGNOSIS — I7410 Embolism and thrombosis of unspecified parts of aorta: Secondary | ICD-10-CM

## 2015-11-02 LAB — PROTIME-INR
INR: 2.19
PROTHROMBIN TIME: 24.2 s — AB (ref 11.4–15.0)

## 2015-11-02 NOTE — Telephone Encounter (Signed)
Called patient and informed her to continue on current dosage of Coumadin.  Also told her MD will not be refilling her Narcotics.  He feels she needs to be seen at the pain clinic and will address that with her at her next appointment.  Patient asked when her next appointment is and I told her August 18th.  She will call scheduling on Monday to reschedule, due to her family vacation the week she is scheduled.

## 2015-11-02 NOTE — Telephone Encounter (Signed)
6 mg of coumadin Monday-Friday and 3 mg on Sat & Sunday. pt is within therapeutic range.  No changes with current coumadin dosing. Pt asking for Rf for narcotics. Dr. Rogue Bussing at this time declines renewing the narcotics. Pt will need to see pain specialist.

## 2015-12-07 ENCOUNTER — Other Ambulatory Visit: Payer: Medicaid Other

## 2015-12-07 ENCOUNTER — Ambulatory Visit: Payer: Medicaid Other | Admitting: Internal Medicine

## 2015-12-08 ENCOUNTER — Encounter (INDEPENDENT_AMBULATORY_CARE_PROVIDER_SITE_OTHER): Payer: Self-pay

## 2015-12-19 ENCOUNTER — Other Ambulatory Visit: Payer: Self-pay | Admitting: Internal Medicine

## 2015-12-20 ENCOUNTER — Other Ambulatory Visit: Payer: Self-pay | Admitting: Internal Medicine

## 2016-01-03 ENCOUNTER — Inpatient Hospital Stay: Payer: Medicaid Other | Admitting: Internal Medicine

## 2016-01-03 ENCOUNTER — Inpatient Hospital Stay: Payer: Medicaid Other

## 2016-01-18 ENCOUNTER — Ambulatory Visit: Payer: Medicaid Other

## 2016-01-22 ENCOUNTER — Ambulatory Visit (INDEPENDENT_AMBULATORY_CARE_PROVIDER_SITE_OTHER): Payer: Medicaid Other | Admitting: Vascular Surgery

## 2016-01-22 ENCOUNTER — Encounter (INDEPENDENT_AMBULATORY_CARE_PROVIDER_SITE_OTHER): Payer: Self-pay | Admitting: Vascular Surgery

## 2016-01-22 VITALS — BP 134/85 | HR 60 | Resp 17 | Ht 67.0 in | Wt 137.0 lb

## 2016-01-22 DIAGNOSIS — F17200 Nicotine dependence, unspecified, uncomplicated: Secondary | ICD-10-CM | POA: Insufficient documentation

## 2016-01-22 DIAGNOSIS — D735 Infarction of spleen: Secondary | ICD-10-CM

## 2016-01-22 DIAGNOSIS — R079 Chest pain, unspecified: Secondary | ICD-10-CM

## 2016-01-22 DIAGNOSIS — I7410 Embolism and thrombosis of unspecified parts of aorta: Secondary | ICD-10-CM | POA: Diagnosis not present

## 2016-01-22 MED ORDER — WARFARIN SODIUM 5 MG PO TABS: 5.0000 mg | ORAL_TABLET | Freq: Every day | ORAL | 11 refills | Status: DC

## 2016-01-22 NOTE — Assessment & Plan Note (Signed)
Patient needs repeat CT scan for further evaluation particularly given the history of chest pain. This be done at her convenience in the near future. We are switching her Coumadin to 5 mg daily as she has had significant fluctuations alternating 3 and 6 mg. I will see her back following her CT scan.

## 2016-01-22 NOTE — Progress Notes (Signed)
MRN : [id number]  Suzanne [name] is a 40 y.o. (26-Jul-1975) female who presents with chief complaint of  Chief Complaint  Patient presents with  . Follow-up    insurance denied ct  .  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 She reports intermittent episodes of chest pain radiating to her left shoulder and arm. She has otherwise been feeling pretty well. She has cut back on smoking but not entirely quit. She reports she is now working and resuming more normal activities.  Current Outpatient Prescriptions  Medication Sig Dispense Refill  . oxyCODONE (OXY IR/ROXICODONE) 5 MG immediate release tablet Take 1 tablet (5 mg total) by mouth every 8 (eight) hours as needed for severe pain. (Patient not taking: Reported on 01/22/2016) 90 tablet 0   No current facility-administered medications for this visit.     Past Medical History:  Diagnosis Date  . 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  Social History Social History  Substance Use Topics  . Smoking status: Current Some Day Smoker    Packs/day: 0.25    Years: 20.00    Types: Cigarettes  . Smokeless tobacco: Never Used  . Alcohol use 0.6 oz/week    1 Cans of beer per week    Family History Family History  Problem Relation Age of Onset  . Adopted: Yes   Family history not known as above  No Known Allergies   REVIEW OF SYSTEMS (Negative unless checked)  Constitutional: [] Weight loss  [] Fever  [] Chills Cardiac: [x] Chest pain   [] Chest pressure   [x] Palpitations   [] Shortness of breath when laying flat   [] Shortness of breath at rest   [] Shortness of breath with exertion. Vascular:  [] Pain in legs with walking   [] Pain in legs at rest   [] Pain in legs when laying flat   [] Claudication   [] Pain in feet when walking  [] Pain in feet at rest  [] Pain in feet when laying flat   [] History of DVT   [] Phlebitis   [] Swelling in legs    [] Varicose veins   [] Non-healing ulcers Pulmonary:   [] Uses home oxygen   [] Productive cough   [] Hemoptysis   [] Wheeze  [] COPD   [] Asthma Neurologic:  [] Dizziness  [] Blackouts   [] Seizures   [] History of stroke   [] History of TIA  [] Aphasia   [] Temporary blindness   [] Dysphagia   [] Weakness or numbness in arms   [] Weakness or numbness in legs Musculoskeletal:  [] Arthritis   [] Joint swelling   [] Joint pain   [] Low back pain Hematologic:  [] Easy bruising  [] Easy bleeding   [] Hypercoagulable state   [] Anemic  [] Hepatitis Gastrointestinal:  [] Blood in stool   [] Vomiting blood  [] Gastroesophageal reflux/heartburn   [] Difficulty swallowing. Genitourinary:  [] Chronic kidney disease   [] Difficult urination  [] Frequent urination  [] Burning with urination   [] Blood in urine Skin:  [] Rashes   [] Ulcers   [] Wounds Psychological:  [] History of anxiety   []  History of major depression.  Physical Examination  Vitals:   01/22/16 0955  BP: 134/85  Pulse: 60  Resp: 17  Weight: 137 lb (62.1 kg)  Height: 5\' 7"  (1.702 m)   Body mass index is 21.46 kg/m. Gen:  WD/WN, NAD Head: [place]/AT, No temporalis wasting. Ear/Nose/Throat: Hearing grossly intact, nares w/o erythema or drainage, trachea midline Eyes: PERRLA, EOMI. Sclera non-icteric Neck: Supple, no nuchal rigidity.  No JVD.  Pulmonary:  Good air movement, No use of accessory muscles  Cardiac: RRR, normal S1, S2 Vascular:  Vessel Right Left  Radial Palpable Palpable  Ulnar Palpable Palpable  Brachial Palpable Palpable  Carotid Palpable, without bruit Palpable, without bruit  Aorta Not palpable N/A  Femoral Palpable Palpable  Popliteal Palpable Palpable  PT Palpable Palpable  DP Palpable Palpable   Gastrointestinal: soft, non-tender/non-distended. No guarding/reflex.  Musculoskeletal: M/S 5/5 throughout.  No deformity or atrophy. no edema. Neurologic: CN 2-12 intact. Pain and light touch intact in extremities.  Symmetrical.  Speech is fluent.  Motor exam as listed above. Psychiatric: Judgment intact, Mood & affect appropriate for pt's clinical situation. Dermatologic: No rashes or ulcers noted.  No cellulitis or open wounds. Lymph : No Cervical, Axillary, or Inguinal lymphadenopathy.     CBC Lab Results  Component Value Date   WBC 7.4 08/03/2015   HGB 17.5 (H) 08/03/2015   HCT 49.9 (H) 08/03/2015   MCV 96.4 08/03/2015   PLT 380 08/03/2015    BMET    Component Value Date/Time   NA 133 (L) 08/03/2015 1046   NA 140 04/23/2011 1538   K 3.6 08/03/2015 1046   K 3.9 04/23/2011 1538   CL 106 08/03/2015 1046   CL 105 04/23/2011 1538   CO2 21 (L) 08/03/2015 1046   CO2 25 04/23/2011 1538   GLUCOSE 130 (H) 08/03/2015 1046   GLUCOSE 117 (H) 04/23/2011 1538   BUN 6 08/03/2015 1046   BUN 2 (L) 04/23/2011 1538   CREATININE 0.77 08/03/2015 1046   CREATININE 0.63 04/23/2011 1538   CALCIUM 8.3 (L) 08/03/2015 1046   CALCIUM 8.9 04/23/2011 1538   GFRNONAA >60 08/03/2015 1046   GFRNONAA >60 04/23/2011 1538   GFRAA >60 08/03/2015 1046   GFRAA >60 04/23/2011 1538   CrCl cannot be calculated (Patient's most recent lab result is older than the maximum 21 days allowed.).  COAG Lab Results  Component Value Date   INR 2.19 11/02/2015   INR 1.38 10/08/2015   INR 2.97 08/03/2015    Radiology No results found.    Assessment/Plan Tobacco dependence We discussed the absolute need for smoking cessation due to the deleterious nature of tobacco on the vascular system. We discussed the tobacco use would diminish patency of any intervention, and likely significantly worsen progressio of disease. We discussed multiple agents for quitting including replacement therapy or medications to reduce cravings such as Chantix. The patient voices their understanding of the importance of smoking cessation.  Aortic thrombus Patient needs repeat CT scan for further evaluation particularly given the history of chest pain. This be done at her  convenience in the near future. We are switching her Coumadin to 5 mg daily as she has had significant fluctuations alternating 3 and 6 mg. I will see her back following her CT scan.    [name], MD  01/22/2016 10:33 AM    This note was created with Dragon medical transcription system.  Any errors from dictation are purely unintentional

## 2016-01-22 NOTE — Assessment & Plan Note (Signed)
We discussed the absolute need for smoking cessation due to the deleterious nature of tobacco on the vascular system. We discussed the tobacco use would diminish patency of any intervention, and likely significantly worsen progressio of disease. We discussed multiple agents for quitting including replacement therapy or medications to reduce cravings such as Chantix. The patient voices their understanding of the importance of smoking cessation. 

## 2016-01-23 ENCOUNTER — Telehealth (INDEPENDENT_AMBULATORY_CARE_PROVIDER_SITE_OTHER): Payer: Self-pay

## 2016-01-23 NOTE — Telephone Encounter (Signed)
Attempted to contact patient was unable to leave a message due to mailbox being full.    By Devona Konig, [place]

## 2016-01-29 ENCOUNTER — Ambulatory Visit: Admission: RE | Admit: 2016-01-29 | Payer: Medicaid Other | Source: Ambulatory Visit

## 2016-02-01 ENCOUNTER — Ambulatory Visit (INDEPENDENT_AMBULATORY_CARE_PROVIDER_SITE_OTHER): Payer: Medicaid Other | Admitting: Vascular Surgery

## 2016-02-11 ENCOUNTER — Ambulatory Visit (HOSPITAL_COMMUNITY): Payer: Medicaid Other

## 2016-02-12 ENCOUNTER — Ambulatory Visit
Admission: RE | Admit: 2016-02-12 | Discharge: 2016-02-12 | Disposition: A | Discharge status: Home / Self Care | Payer: Medicaid Other | Source: Ambulatory Visit | Attending: Vascular Surgery | Admitting: Vascular Surgery

## 2016-02-12 DIAGNOSIS — I7410 Embolism and thrombosis of unspecified parts of aorta: Secondary | ICD-10-CM | POA: Diagnosis not present

## 2016-02-12 MED ORDER — IOPAMIDOL (ISOVUE-370) INJECTION 76%
100.0000 mL | Freq: Once | INTRAVENOUS | Status: AC | PRN
  Administered 2016-02-12: 100 mL via INTRAVENOUS

## 2016-02-15 ENCOUNTER — Encounter (INDEPENDENT_AMBULATORY_CARE_PROVIDER_SITE_OTHER): Payer: Self-pay | Admitting: Vascular Surgery

## 2016-02-15 ENCOUNTER — Ambulatory Visit (INDEPENDENT_AMBULATORY_CARE_PROVIDER_SITE_OTHER): Payer: Medicaid Other | Admitting: Vascular Surgery

## 2016-02-15 VITALS — BP 131/78 | HR 64 | Resp 17 | Ht 67.0 in | Wt 141.0 lb

## 2016-02-15 DIAGNOSIS — D735 Infarction of spleen: Secondary | ICD-10-CM

## 2016-02-15 DIAGNOSIS — I7410 Embolism and thrombosis of unspecified parts of aorta: Secondary | ICD-10-CM | POA: Diagnosis not present

## 2016-02-15 DIAGNOSIS — F17200 Nicotine dependence, unspecified, uncomplicated: Secondary | ICD-10-CM

## 2016-02-15 NOTE — Assessment & Plan Note (Signed)
I have independently reviewed the patient's CT scan. She has a very small amount of residual aortic thrombus and proximal left subclavian artery thrombus which is minimal and not flow-limiting all at this point. This is stable to slightly improved from her study 6 months ago. She will continue full anticoagulation. I will plan to reimage her in 1 year with a CT scan. She was again advised to stop smoking.

## 2016-02-15 NOTE — Assessment & Plan Note (Signed)
Remote.  No intervention was needed.

## 2016-02-15 NOTE — Progress Notes (Signed)
MRN : [id number]  Suzanne [name] is a 40 y.o. (08/22/1975) female who presents with chief complaint of  Chief Complaint  Patient presents with  . Re-evaluation    CT results  .  History of Present Illness: Patient returns today in follow up of Her thoracic aortic thrombus and previous splenic infarction. She reports an episode of severe left upper abdominal pain which resolved after about 48 hours. She is otherwise in her usual state of health today. I have independently reviewed the patient's CT scan. She has a very small amount of residual aortic thrombus and proximal left subclavian artery thrombus which is minimal and not flow-limiting all at this point. This is stable to slightly improved from her study 6 months ago.  Current Outpatient Prescriptions  Medication Sig Dispense Refill  . oxyCODONE (OXY IR/ROXICODONE) 5 MG immediate release tablet Take 1 tablet (5 mg total) by mouth every 8 (eight) hours as needed for severe pain. (Patient not taking: Reported on 02/15/2016) 90 tablet 0  . warfarin (COUMADIN) 5 MG tablet Take 1 tablet (5 mg total) by mouth daily. 30 tablet 11   No current facility-administered medications for this visit.     Past Medical History:  Diagnosis Date  . 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  Social History Social History  Substance Use Topics  . Smoking status: Current Some Day Smoker    Packs/day: 0.25    Years: 20.00    Types: Cigarettes  . Smokeless tobacco: Never Used  . Alcohol use 0.6 oz/week    1 Cans of beer per week     Family History Family History  Problem Relation Age of Onset  . Adopted: Yes     No Known Allergies   REVIEW OF SYSTEMS (Negative unless checked)  Constitutional: [] Weight loss  [] Fever  [] Chills Cardiac: [] Chest pain   [] Chest pressure   [] Palpitations   [] Shortness of breath when laying flat   [] Shortness of breath at rest   [] Shortness of breath with  exertion. Vascular:  [] Pain in legs with walking   [] Pain in legs at rest   [] Pain in legs when laying flat   [] Claudication   [] Pain in feet when walking  [] Pain in feet at rest  [] Pain in feet when laying flat   [] History of DVT   [] Phlebitis   [] Swelling in legs   [] Varicose veins   [] Non-healing ulcers Pulmonary:   [] Uses home oxygen   [] Productive cough   [] Hemoptysis   [] Wheeze  [] COPD   [] Asthma Neurologic:  [] Dizziness  [] Blackouts   [] Seizures   [] History of stroke   [] History of TIA  [] Aphasia   [] Temporary blindness   [] Dysphagia   [] Weakness or numbness in arms   [] Weakness or numbness in legs Musculoskeletal:  [] Arthritis   [] Joint swelling   [] Joint pain   [] Low back pain Hematologic:  [] Easy bruising  [] Easy bleeding   [] Hypercoagulable state   [] Anemic   Gastrointestinal:  [] Blood in stool   [] Vomiting blood  [] Gastroesophageal reflux/heartburn   [x] Abdominal pain Genitourinary:  [] Chronic kidney disease   [] Difficult urination  [] Frequent urination  [] Burning with urination   [] Hematuria Skin:  [] Rashes   [] Ulcers   [] Wounds Psychological:  [] History of anxiety   []  History of major depression.  Physical Examination  BP 131/78 (BP Location: Right Arm)   Pulse 64   Resp 17   Ht 5\' 7"  (1.702  m)   Wt 64 kg (141 lb)   LMP 01/29/2016   BMI 22.08 kg/m  Gen:  WD/WN, NAD Head: [place]/AT, No temporalis wasting. Ear/Nose/Throat: Hearing grossly intact, nares w/o erythema or drainage, trachea midline Eyes: Conjunctiva clear. Sclera non-icteric Neck: Supple.  No JVD.  Pulmonary:  Good air movement, no use of accessory muscles.  Cardiac: RRR, normal S1, S2 Vascular:  Vessel Right Left  Radial Palpable Palpable                                   Gastrointestinal: soft, non-tender/non-distended. No guarding/reflex.  Musculoskeletal: M/S 5/5 throughout.  No deformity or atrophy. No edema. Neurologic: Sensation grossly intact in extremities.  Symmetrical.  Speech is fluent.    Psychiatric: Judgment intact, Mood & affect appropriate for pt's clinical situation. Dermatologic: No rashes or ulcers noted.  No cellulitis or open wounds. Lymph : No Cervical, Axillary, or Inguinal lymphadenopathy.      Labs No results found for this or any previous visit (from the past 2160 hour(s)).  Radiology Ct Angio Chest W/cm &/or Wo Cm  Result Date: 02/12/2016 CLINICAL DATA:  History of aortic thrombus EXAM: CT ANGIOGRAPHY CHEST WITH CONTRAST TECHNIQUE: Multidetector CT imaging of the chest was performed using the standard protocol during bolus administration of intravenous contrast. Multiplanar CT image reconstructions and MIPs were obtained to evaluate the vascular anatomy. CONTRAST:  100 cc Isovue 370 COMPARISON:  07/19/2015 FINDINGS: Cardiovascular: No evidence of aortic aneurysm or dissection. No intramural hematoma. Mural thrombus adherent to the anterior descending thoracic aortic wall is stable and smooth. Minimal linear thrombus within the left subclavian artery is stable. There is no new thrombus within the thorax. The innominate artery, left common carotid artery, bilateral vertebral arteries, right common carotid artery, and right subclavian artery are patent. No obvious filling defect is present in the pulmonary arterial tree. Mediastinum/Nodes: Small mediastinal and axillary nodes are not significantly changed. Lungs/Pleura: There are apical bleb is right greater than left. No lung mass or consolidation. No pneumothorax or pleural effusion. Upper Abdomen: 7 mm low-density lesion in the anterior body of the pancreas is stable since 01/15/2015. Stability supports benign etiology. Musculoskeletal: No vertebral compression deformity. Posterior osteophytic ridging occurs at the C6-7 disc in the cervical spine. Review of the MIP images confirms the above findings. 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 There is chronic smooth  mural thrombus along the anterior descending thoracic aorta which is stable. Electronically Signed   By: Marybelle Killings M.D.   On: 02/12/2016 15:14     Assessment/Plan  Tobacco dependence We discussed the absolute need for smoking cessation due to the deleterious nature of tobacco on the vascular system. We discussed the tobacco use would diminish patency of any intervention, and likely significantly worsen progressio of disease. We discussed multiple agents for quitting including replacement therapy or medications to reduce cravings such as Chantix. The patient voices their understanding of the importance of smoking cessation.   Splenic infarction Remote.  No intervention was needed.  Aortic thrombus I have independently reviewed the patient's CT scan. She has a very small amount of residual aortic thrombus and proximal left subclavian artery thrombus which is minimal and not flow-limiting all at this point. This is stable to slightly improved from her study 6 months ago. She will continue full anticoagulation. I will plan to reimage her in 1 year  with a CT scan. She was again advised to stop smoking.    [name], MD  02/15/2016 2:39 PM    This note was created with Dragon medical transcription system.  Any errors from dictation are purely unintentional

## 2016-02-15 NOTE — Assessment & Plan Note (Signed)
We discussed the absolute need for smoking cessation due to the deleterious nature of tobacco on the vascular system. We discussed the tobacco use would diminish patency of any intervention, and likely significantly worsen progressio of disease. We discussed multiple agents for quitting including replacement therapy or medications to reduce cravings such as Chantix. The patient voices their understanding of the importance of smoking cessation. 

## 2016-11-19 ENCOUNTER — Telehealth (INDEPENDENT_AMBULATORY_CARE_PROVIDER_SITE_OTHER): Payer: Self-pay | Admitting: Vascular Surgery

## 2016-11-19 ENCOUNTER — Emergency Department: Payer: Medicaid Other

## 2016-11-19 ENCOUNTER — Emergency Department
Admission: EM | Admit: 2016-11-19 | Discharge: 2016-11-19 | Disposition: A | Discharge status: Home / Self Care | Payer: Medicaid Other | Attending: Emergency Medicine | Admitting: Emergency Medicine

## 2016-11-19 DIAGNOSIS — R042 Hemoptysis: Secondary | ICD-10-CM | POA: Diagnosis present

## 2016-11-19 DIAGNOSIS — Z5321 Procedure and treatment not carried out due to patient leaving prior to being seen by health care provider: Secondary | ICD-10-CM | POA: Insufficient documentation

## 2016-11-19 LAB — COMPREHENSIVE METABOLIC PANEL
ALBUMIN: 4 g/dL (ref 3.5–5.0)
ALT: 20 U/L (ref 14–54)
AST: 33 U/L (ref 15–41)
Alkaline Phosphatase: 77 U/L (ref 38–126)
Anion gap: 12 (ref 5–15)
BILIRUBIN TOTAL: 0.5 mg/dL (ref 0.3–1.2)
CHLORIDE: 106 mmol/L (ref 101–111)
CO2: 21 mmol/L — AB (ref 22–32)
Calcium: 8.8 mg/dL — ABNORMAL LOW (ref 8.9–10.3)
Creatinine, Ser: 0.68 mg/dL (ref 0.44–1.00)
GFR calc Af Amer: >60 mL/min (ref >60)
GFR calc non Af Amer: >60 mL/min (ref >60)
GLUCOSE: 117 mg/dL — AB (ref 65–99)
POTASSIUM: 4.1 mmol/L (ref 3.5–5.1)
SODIUM: 139 mmol/L (ref 135–145)
Total Protein: 7.5 g/dL (ref 6.5–8.1)

## 2016-11-19 LAB — CBC
HEMATOCRIT: 45.3 % (ref 35.0–47.0)
Hemoglobin: 15.5 g/dL (ref 12.0–16.0)
MCH: 32.6 pg (ref 26.0–34.0)
MCHC: 34.3 g/dL (ref 32.0–36.0)
MCV: 95.2 fL (ref 80.0–100.0)
Platelets: 452 10*3/uL — ABNORMAL HIGH (ref 150–440)
RBC: 4.76 MIL/uL (ref 3.80–5.20)
RDW: 13.3 % (ref 11.5–14.5)
WBC: 11.1 10*3/uL — ABNORMAL HIGH (ref 3.6–11.0)

## 2016-11-19 NOTE — ED Triage Notes (Signed)
Pt states x few days has been coughing up blood. Denies blood in stool. States bright red. Denies vomiting. States she just feels bad. Pt is drinking in triage. Alert, oriented, ambulatory. Pt states intermittent CP that feels like indigestion and "elephant sitting on my chest." denies CP at current.

## 2016-11-19 NOTE — Telephone Encounter (Signed)
Spoke with the patient, she states she has been spitting up blood the last two mornings, having chest pains and feels like she is going to pass out. I advised the patient to go be seen at the ED for evaluation and find out what is wrong, I also let her know we did not have any providers in the office today so it was imperative that she be seen at the ED for evaluation. She stated okay and hung up.

## 2016-11-19 NOTE — Telephone Encounter (Signed)
Guida CALLED SAID SHE WAS SPITTING UP BLOOD. SHE SAID DR SCHNIER SAID TO COME IN IF SHE HAD ANY PROBLEMS AND SHE IS SPITTING UP BLOOD AND SHE NEEDED TO COME IN ASAP

## 2016-11-20 ENCOUNTER — Emergency Department
Admission: EM | Admit: 2016-11-20 | Discharge: 2016-11-21 | Disposition: A | Discharge status: Home / Self Care | Payer: Medicaid Other | Attending: Student in an Organized Health Care Education/Training Program | Admitting: Student in an Organized Health Care Education/Training Program

## 2016-11-20 ENCOUNTER — Telehealth (INDEPENDENT_AMBULATORY_CARE_PROVIDER_SITE_OTHER): Payer: Self-pay | Admitting: Vascular Surgery

## 2016-11-20 ENCOUNTER — Emergency Department: Payer: Medicaid Other

## 2016-11-20 ENCOUNTER — Encounter: Payer: Self-pay | Admitting: Emergency Medicine

## 2016-11-20 ENCOUNTER — Other Ambulatory Visit: Payer: Self-pay

## 2016-11-20 DIAGNOSIS — I7411 Embolism and thrombosis of thoracic aorta: Secondary | ICD-10-CM | POA: Diagnosis not present

## 2016-11-20 DIAGNOSIS — R791 Abnormal coagulation profile: Secondary | ICD-10-CM

## 2016-11-20 DIAGNOSIS — Z7901 Long term (current) use of anticoagulants: Secondary | ICD-10-CM | POA: Insufficient documentation

## 2016-11-20 DIAGNOSIS — R042 Hemoptysis: Secondary | ICD-10-CM | POA: Diagnosis not present

## 2016-11-20 DIAGNOSIS — R079 Chest pain, unspecified: Secondary | ICD-10-CM | POA: Diagnosis not present

## 2016-11-20 DIAGNOSIS — F17210 Nicotine dependence, cigarettes, uncomplicated: Secondary | ICD-10-CM | POA: Diagnosis not present

## 2016-11-20 NOTE — Telephone Encounter (Signed)
Suzanne [name] CALLED BACK AND SAID THAT SHE WENT TO TH ER AND SAT FOR 6 HOURS AND LEFT CAUSE THEY COULDN'T TELL HER WHEN SHE WAS GOING TO   GET A ROOM THERE. SHE SAID SHE WAS RECORDING THE  CONVERSATION AND I DIDN'T SAY A WORD UNTIL SHE ASKED WHEN WAS DR DEW WAS GOING TO BE HERE I SAID TOMORROW. SHE ALSO STATED DR Lucky Cowboy SAID ANYTHING CAME UP WITH HER HE WANTED TO SEE HER.

## 2016-11-20 NOTE — ED Notes (Signed)
Went in to get patient into a gown and start IV. Patient does not want to put the gown on and does not want an IV. Will let EDP see patient before any further.

## 2016-11-20 NOTE — ED Triage Notes (Addendum)
Patient ambulatory to triage with steady gait, without difficulty or distress noted; pt reports here yesterday for CP but left before being seen; st has had hemoptysis and "squeezing" pain to left breast x week; +smoker, currently taking warfarin; pt taken immed to room 8 by EDT [facility] for further evaluation

## 2016-11-21 ENCOUNTER — Encounter: Payer: Self-pay | Admitting: Radiology

## 2016-11-21 ENCOUNTER — Telehealth: Payer: Self-pay | Admitting: Emergency Medicine

## 2016-11-21 ENCOUNTER — Emergency Department: Payer: Medicaid Other

## 2016-11-21 ENCOUNTER — Other Ambulatory Visit (INDEPENDENT_AMBULATORY_CARE_PROVIDER_SITE_OTHER): Payer: Self-pay

## 2016-11-21 DIAGNOSIS — I7410 Embolism and thrombosis of unspecified parts of aorta: Secondary | ICD-10-CM

## 2016-11-21 LAB — PROTIME-INR
INR: 4.67
Prothrombin Time: 45.3 seconds — ABNORMAL HIGH (ref 11.4–15.2)

## 2016-11-21 LAB — TROPONIN I

## 2016-11-21 MED ORDER — DEXAMETHASONE SODIUM PHOSPHATE 10 MG/ML IJ SOLN
10.0000 mg | Freq: Once | INTRAMUSCULAR | Status: AC
  Administered 2016-11-21: 10 mg via INTRAVENOUS

## 2016-11-21 MED ORDER — IOPAMIDOL (ISOVUE-370) INJECTION 76%
100.0000 mL | Freq: Once | INTRAVENOUS | Status: AC | PRN
  Administered 2016-11-21: 100 mL via INTRAVENOUS

## 2016-11-21 MED ORDER — DEXAMETHASONE SODIUM PHOSPHATE 10 MG/ML IJ SOLN
INTRAMUSCULAR | Status: AC
  Filled 2016-11-21: qty 1

## 2016-11-21 MED ORDER — SODIUM CHLORIDE 0.9 % IV BOLUS (SEPSIS)
1000.0000 mL | Freq: Once | INTRAVENOUS | Status: AC
  Administered 2016-11-21: 1000 mL via INTRAVENOUS

## 2016-11-21 MED ORDER — ALBUTEROL SULFATE HFA 108 (90 BASE) MCG/ACT IN AERS: 2.0000 | INHALATION_SPRAY | Freq: Four times a day (QID) | RESPIRATORY_TRACT | 2 refills | Status: AC | PRN

## 2016-11-21 MED ORDER — IPRATROPIUM-ALBUTEROL 0.5-2.5 (3) MG/3ML IN SOLN
3.0000 mL | Freq: Once | RESPIRATORY_TRACT | Status: DC
  Filled 2016-11-21: qty 3

## 2016-11-21 MED ORDER — NICOTINE 14 MG/24HR TD PT24: 14.0000 mg | MEDICATED_PATCH | Freq: Every day | TRANSDERMAL | 0 refills | Status: DC

## 2016-11-21 NOTE — ED Notes (Signed)
Patient verbalizes understanding of d/c instructions and follow-up. VS stable and pain controlled per patient.  Patient in NAD at time of d/c and denies further concerns regarding this visit. Patient stable at the time of departure from the unit, departing unit by the safest and most appropriate manner per that patients condition and limitations. Patient advised to return to the ED at any time for emergent concerns, or for new/worsening symptoms.    Patient refused wheel chair out  

## 2016-11-21 NOTE — Telephone Encounter (Signed)
Spoke with Mickel Baas, RN at Dr. Bunnie Domino office. Patient with follow up appointment on Tuesday (11/25/16) at 11:45. Per Mickel Baas, RN, pt can go to the lab at [facility] to have outpatient repeat labs drawn. This RN called patient back with instructions for recheck and follow up appt.

## 2016-11-21 NOTE — ED Provider Notes (Signed)
[facility] Emergency Department Provider Note    First MD Initiated Contact with Patient 11/20/16 2332     (approximate)  I have reviewed the triage vital signs and the nursing notes.   HISTORY  Chief Complaint Chest Pain and Hemoptysis    HPI Suzanne [name] is a 41 y.o. 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 There is no pain radiating up to her neck or shoulder. Patient states she's also been noticing frequent cough and sometimes having blood-tinged sputum. She smokes "a lot". Has had bronchitis in the past. Does not feel any shortness of breath at this moment. No nausea or vomiting. No hematemesis or melena. As any fevers. There is no change in her pain or chest discomfort with exertion. No lower extremity swelling. No orthopnea.   Past Medical History:  Diagnosis Date  . Aortic thrombus ([place])   . 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 Adopted: Yes   Past Surgical History:  Procedure Laterality Date  . CESAREAN SECTION     Patient Active Problem List   Diagnosis Date Noted  . Chest pain 01/22/2016  . Tobacco dependence 01/22/2016  . 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 Aortic thrombus ([place]) 01/15/2015      Prior to Admission medications   Medication Sig Start Date End Date Taking? Authorizing Provider  oxyCODONE (OXY IR/ROXICODONE) 5 MG immediate release tablet Take 1 tablet (5 mg total) by mouth every 8 (eight) hours as needed for severe pain. Patient not taking: Reported on 02/15/2016 10/05/15   Cammie Sickle, MD  warfarin (COUMADIN) 5 MG tablet Take 1 tablet (5 mg total) by mouth daily. 01/22/16   Algernon Huxley, MD    Allergies Patient has no known allergies.    Social History Social History  Substance Use  Topics  . Smoking status: Current Some Day Smoker    Packs/day: 0.25    Years: 20.00    Types: Cigarettes  . Smokeless tobacco: Never Used  . Alcohol use 0.6 oz/week    1 Cans of beer per week    Review of Systems Patient denies headaches, rhinorrhea, blurry vision, numbness, shortness of breath, chest pain, edema, cough, abdominal pain, nausea, vomiting, diarrhea, dysuria, fevers, rashes or hallucinations unless otherwise stated above in HPI. ____________________________________________   PHYSICAL EXAM:  VITAL SIGNS: Vitals:   11/20/16 2306  BP: (!) 179/120  Pulse: 83  Resp: 18  Temp: 98.6 F (37 C)    Constitutional: Alert and oriented. Well appearing and in no acute distress. Eyes: Conjunctivae are normal.  Head: Atraumatic. Nose: No congestion/rhinnorhea. Mouth/Throat: Mucous membranes are moist.   Neck: No stridor. Painless ROM.  Cardiovascular: Normal rate, regular rhythm. Grossly normal heart sounds.  Good peripheral circulation. Respiratory: Normal respiratory effort.  No retractions. Lungs with faint expiratory wheeze but good airmovement Gastrointestinal: Soft and nontender. No distention. No abdominal bruits. No CVA tenderness. Musculoskeletal: No lower extremity tenderness nor edema.  No joint effusions. Neurologic:  Normal speech and language. No gross focal neurologic deficits are appreciated. No facial droop Skin:  Skin is warm, dry and intact. No rash noted. Psychiatric: Mood and affect are normal. Speech and behavior are normal.  ____________________________________________   LABS (all labs ordered are listed, but only abnormal results are  displayed)  Results for orders placed or performed during the hospital encounter of 11/20/16 (from the past 24 hour(s))  Troponin I     Status: None   Collection Time: 11/21/16 12:06 AM  Result Value Ref Range   Troponin I <0.03 <0.03 ng/mL   ____________________________________________  EKG My review and  personal interpretation at Time: 23:04   Indication: chest pain  Rate: 70  Rhythm: sinus Axis: normal Other: normal intervals, no stemi, no st depressions, no brugada or wpw ____________________________________________  RADIOLOGY  I personally reviewed all radiographic images ordered to evaluate for the above acute complaints and reviewed radiology reports and findings.  These findings were personally discussed with the patient.  Please see medical record for radiology report.  ____________________________________________   PROCEDURES  Procedure(s) performed:  Procedures    Critical Care performed: no ____________________________________________   INITIAL IMPRESSION / ASSESSMENT AND PLAN / ED COURSE  Pertinent labs & imaging results that were available during my care of the patient were reviewed by me and considered in my medical decision making (see chart for details).  DDX: ACS, pericarditis, esophagitis, boerhaaves, pe, dissection, pna, bronchitis, costochondritis   Suzanne [name] is a 41 y.o. who presents to the ED with 3 days of chest pains and intermittent hemoptysis. Patient afebrile and he mechanically stable. She is well-appearing and in no acute distress. Patient is on Coumadin therefore a lower suspicion for pulmonary embolism. Patient does smoke heavily therefore do suspect some component of bronchitis with possible supratherapeutic INR is she's not had her INR checked in the past year. No evidence of life-threatening bleeding. EKG is normal and her troponin is negative. Patient has a heart score of 2. This is not clinically consistent with ACS. CT imaging will be ordered to evaluate for dissection or recurrent aortic thrombus based on her history.  Clinical Course as of Nov 22 330  Fri Nov 21, 2016  0123 CT imaging is reassuring. No evidence of dissection or thrombus. Patient's INR is supratherapeutic at 4.6. This likely cause of patient's intermittent hemoptysis  initially in her case of being a heavy smoker. Will arrange for the patient have repeat PT/INR testing with holding her dose of Coumadin tomorrow. Patient will follow up with Dr. dew or cancer center for further management of her Coumadin.  [PR]  0123 Patient will be given a dose of Decadron for component of bronchitis. Patient will be prescribed albuterol.  Have discussed with the patient and available family all diagnostics and treatments performed thus far and all questions were answered to the best of my ability. The patient demonstrates understanding and agreement with plan.   [PR]    Clinical Course User Index [PR] Merlyn Lot, MD     ____________________________________________   FINAL CLINICAL IMPRESSION(S) / ED DIAGNOSES  Final diagnoses:  Chest pain, unspecified type  Hemoptysis  Supratherapeutic INR      NEW MEDICATIONS STARTED DURING THIS VISIT:  New Prescriptions   No medications on file     Note:  This document was prepared using Dragon voice recognition software and may include unintentional dictation errors.    Merlyn Lot, MD 11/21/16 [phone]

## 2016-11-21 NOTE — ED Notes (Signed)
Patient states that breathing treatments give her a headache and she would really rather not do it. Patient states she is breathing fine.

## 2016-11-22 ENCOUNTER — Other Ambulatory Visit
Admission: RE | Admit: 2016-11-22 | Discharge: 2016-11-22 | Disposition: A | Discharge status: Home / Self Care | Payer: Medicaid Other | Source: Ambulatory Visit | Attending: Vascular Surgery | Admitting: Vascular Surgery

## 2016-11-22 DIAGNOSIS — D735 Infarction of spleen: Secondary | ICD-10-CM | POA: Diagnosis present

## 2016-11-22 DIAGNOSIS — I7410 Embolism and thrombosis of unspecified parts of aorta: Secondary | ICD-10-CM

## 2016-11-22 LAB — PROTIME-INR
INR: 1.94
PROTHROMBIN TIME: 22.4 s — AB (ref 11.4–15.2)

## 2016-11-24 NOTE — Telephone Encounter (Signed)
Attempted to contact patient after listening to a voice mail that was left on the nurses line on Saturday 11/23/06 regarding her INR level of 1.9. She wanted advice as to when to take restart her Coumadin because she was told at the hospital to not take it for 24 hours due to it being 4.0,  and have her level rechecked on Saturday. She has an appt in our office Tuesday 11/25/16 with Dr. Lucky Cowboy regarding her INR. I was unable to leave a message because her mailbox is full.

## 2016-11-25 ENCOUNTER — Ambulatory Visit (INDEPENDENT_AMBULATORY_CARE_PROVIDER_SITE_OTHER): Payer: Medicaid Other | Admitting: Vascular Surgery

## 2016-11-25 ENCOUNTER — Encounter (INDEPENDENT_AMBULATORY_CARE_PROVIDER_SITE_OTHER): Payer: Self-pay | Admitting: Vascular Surgery

## 2016-11-25 VITALS — BP 129/82 | HR 61 | Resp 16 | Wt 131.0 lb

## 2016-11-25 DIAGNOSIS — F17200 Nicotine dependence, unspecified, uncomplicated: Secondary | ICD-10-CM

## 2016-11-25 DIAGNOSIS — D735 Infarction of spleen: Secondary | ICD-10-CM

## 2016-11-25 DIAGNOSIS — I7410 Embolism and thrombosis of unspecified parts of aorta: Secondary | ICD-10-CM

## 2016-11-25 NOTE — Progress Notes (Signed)
MRN : [id number]  Suzanne [name] is a 41 y.o. (1975/12/14) female who presents with chief complaint of  Chief Complaint  Patient presents with  . Follow-up  .  History of Present Illness: Patient returns today in follow up of thoracic aortic thrombosis. She had been feeling poorly and last week went to the emergency room and was found to be supratherapeutic on her INR. She held her Coumadin and at last check her INR was 1.9 and her Coumadin was resumed. She feels well today. She was coughing up blood and having lots of issues which seemed to be resolved. She had a CT scan performed at the hospital which I have independently reviewed. There shows some mild thoracic aortic plaque without thrombus or stenosis present.  Current Outpatient Prescriptions  Medication Sig Dispense Refill  . albuterol (PROVENTIL HFA;VENTOLIN HFA) 108 (90 Base) MCG/ACT inhaler Inhale 2 puffs into the lungs every 6 (six) hours as needed for wheezing or shortness of breath. 1 Inhaler 2  . nicotine (NICODERM CQ) 14 mg/24hr patch Place 1 patch (14 mg total) onto the skin daily. 28 patch 0  . warfarin (COUMADIN) 5 MG tablet Take 1 tablet (5 mg total) by mouth daily. 30 tablet 11  . oxyCODONE (OXY IR/ROXICODONE) 5 MG immediate release tablet Take 1 tablet (5 mg total) by mouth every 8 (eight) hours as needed for severe pain. (Patient not taking: Reported on 02/15/2016) 90 tablet 0   No current facility-administered medications for this visit.     Past Medical History:  Diagnosis Date  . Aortic thrombus ([place])   . 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 CESAREAN SECTION      Social History       Social History  Substance Use Topics  . Smoking status: Current Some Day Smoker    Packs/day: 0.25    Years: 20.00    Types: Cigarettes  . Smokeless tobacco: Never Used  . Alcohol use 0.6 oz/week     1 Cans of beer per week      Family History      Family  History  Problem Relation Age of Onset  . Adopted: Yes     No Known Allergies   REVIEW OF SYSTEMS (Negative unless checked)  Constitutional: _0 Weight loss  _1 Fever  _2 Chills Cardiac: _3 Chest pain   _4 Chest pressure   _5 Palpitations   _6 Shortness of breath when laying flat   _7 Shortness of breath at rest   _8 Shortness of breath with exertion. Vascular:  _9 Pain in legs with walking   _10 Pain in legs at rest   _11 Pain in legs when laying flat   _12 Claudication   _13 Pain in feet when walking  _14 Pain in feet at rest  _15 Pain in feet when laying flat   _16 History of DVT   _17 Phlebitis   _18 Swelling in legs   _19 Varicose veins   _20 Non-healing ulcers Pulmonary:   _21 Uses home oxygen   _22 Productive cough   _23 Hemoptysis   _24 Wheeze  _25 COPD   _26 Asthma Neurologic:  _27 Dizziness  _28 Blackouts   _29 Seizures   _30 History of stroke   _31 History of TIA  _32 Aphasia   _33 Temporary blindness   _34 Dysphagia   _35 Weakness or numbness in arms   _36 Weakness or numbness in legs Musculoskeletal:  _37 Arthritis   _38 Joint swelling   _39 Joint pain   _40 Low back pain Hematologic:  _41 Easy bruising  _42 Easy bleeding   _43 Hypercoagulable state   _44 Anemic   Gastrointestinal:  _45 Blood  in stool   _0 Vomiting blood  _1 Gastroesophageal reflux/heartburn   _2 Abdominal pain Genitourinary:  _3 Chronic kidney disease   _4 Difficult urination  _5 Frequent urination  _6 Burning with urination   _7 Hematuria Skin:  _8 Rashes   _9 Ulcers   _10 Wounds Psychological:  _11 History of anxiety   _12  History of major depression.   Physical Examination  BP 129/82   Pulse 61   Resp 16   Wt 131 lb (59.4 kg)   LMP 11/12/2016 (Exact Date)   BMI 20.52 kg/m  Gen:   NAD. Thin and healthy-appearing Head: [place]/AT, No temporalis wasting. Ear/Nose/Throat: Hearing grossly intact, nares w/o erythema or drainage, trachea midline Eyes: Conjunctiva clear. Sclera non-icteric Neck: Supple.  No JVD.  Pulmonary:  Good air movement, no use of accessory muscles.  Cardiac: RRR, normal  S1, S2 Vascular:  Vessel Right Left  Radial Palpable Palpable                          PT Palpable Palpable  DP Palpable Palpable    Musculoskeletal: M/S 5/5 throughout.  No deformity or atrophy.  Neurologic: Sensation grossly intact in extremities.  Symmetrical.  Speech is fluent.  Psychiatric: Judgment intact, Mood & affect appropriate for pt's clinical situation. Dermatologic: No rashes or ulcers noted.  No cellulitis or open wounds.       Labs Recent Results (from the past 2160 hour(s))  Comprehensive metabolic panel     Status: Abnormal   Collection Time: 11/19/16  1:15 PM  Result Value Ref Range   Sodium 139 135 - 145 mmol/L   Potassium 4.1 3.5 - 5.1 mmol/L   Chloride 106 101 - 111 mmol/L   CO2 21 (L) 22 - 32 mmol/L   Glucose, Bld 117 (H) 65 - 99 mg/dL   BUN <5 (L) 6 - 20 mg/dL   Creatinine, Ser 0.68 0.44 - 1.00 mg/dL   Calcium 8.8 (L) 8.9 - 10.3 mg/dL   Total Protein 7.5 6.5 - 8.1 g/dL   Albumin 4.0 3.5 - 5.0 g/dL   AST 33 15 - 41 U/L   ALT 20 14 - 54 U/L   Alkaline Phosphatase 77 38 - 126 U/L   Total Bilirubin 0.5 0.3 - 1.2 mg/dL   GFR calc non Af Amer >60 >60 mL/min   GFR calc Af Amer >60 >60 mL/min    Comment: (NOTE) The eGFR has been calculated using the CKD EPI equation. This calculation has not been validated in all clinical situations. eGFR's persistently <60 mL/min signify possible Chronic Kidney Disease.    Anion gap 12 5 - 15  CBC     Status: Abnormal   Collection Time: 11/19/16  1:15 PM  Result Value Ref Range   WBC 11.1 (H) 3.6 - 11.0 K/uL   RBC 4.76 3.80 - 5.20 MIL/uL   Hemoglobin 15.5 12.0 - 16.0 g/dL   HCT 45.3 35.0 - 47.0 %   MCV 95.2 80.0 - 100.0 fL   MCH 32.6 26.0 - 34.0 pg   MCHC 34.3 32.0 - 36.0 g/dL   RDW 13.3 11.5 - 14.5 %   Platelets 452 (H) 150 - 440 K/uL  Protime-INR     Status: Abnormal   Collection Time: 11/21/16 12:06 AM  Result Value Ref Range   Prothrombin Time 45.3 (H) 11.4 - 15.2 seconds   INR 4.67 (HH)      Comment: CRITICAL RESULT CALLED TO, READ BACK BY AND VERIFIED WITH: KAITLIN KIZZIAH AT 0100 ON 11/21/16  RWW   Troponin I     Status: None   Collection Time: 11/21/16 12:06 AM  Result Value Ref Range   Troponin I <0.03 <0.03 ng/mL  Protime-INR     Status: Abnormal   Collection Time: 11/22/16 12:42 PM  Result Value Ref Range   Prothrombin Time 22.4 (H) 11.4 - 15.2 seconds   INR 1.94     Radiology Dg Chest 2 View  Result Date: 11/20/2016 CLINICAL DATA:  Initial evaluation for acute chest pain. EXAM: CHEST  2 VIEW COMPARISON:  Prior study from 02/12/2016. FINDINGS: The cardiac and mediastinal silhouettes are stable in size and contour, and remain within normal limits. The lungs are normally inflated. No airspace consolidation, pleural effusion, or pulmonary edema is identified. There is no pneumothorax. No acute osseous abnormality identified. IMPRESSION: No active cardiopulmonary disease. Electronically Signed   By: Jeannine Boga M.D.   On: 11/20/2016 23:59   Ct Angio Chest Aorta W And/or Wo Contrast  Result Date: 11/21/2016 CLINICAL DATA:  41 year old female with hemoptysis. EXAM: CT ANGIOGRAPHY CHEST WITH CONTRAST TECHNIQUE: Multidetector CT imaging of the chest was performed using the standard protocol during bolus administration of intravenous contrast. Multiplanar CT image reconstructions and MIPs were obtained to evaluate the vascular anatomy. CONTRAST:  100 cc Isovue 370 COMPARISON:  Chest radiograph dated 11/20/2016 FINDINGS: Cardiovascular: There is no cardiomegaly or pericardial effusion. There is mild noncalcified plaque in the descending thoracic aorta. No aneurysmal dilatation or evidence of dissection. There is no CT evidence of central pulmonary artery embolus. Mediastinum/Nodes: There is no hilar or mediastinal adenopathy. The esophagus and the thyroid gland are grossly unremarkable. No mediastinal fluid collection or hematoma. Lungs/Pleura: Mild paraseptal emphysema. The lungs  are clear. There is no pleural effusion or pneumothorax. The central airways are patent. Upper Abdomen: No acute abnormality. Musculoskeletal: No chest wall abnormality. No acute or significant osseous findings. Review of the MIP images confirms the above findings. IMPRESSION: 1. No acute intrathoracic pathology. No aortic dissection or aneurysm. No CT evidence of central pulmonary artery embolus. 2. Mild noncalcified plaque along the descending thoracic aorta. Electronically Signed   By: Anner Crete M.D.   On: 11/21/2016 01:06     Assessment/Plan Tobacco dependence We discussed the absolute need for smoking cessation due to the deleterious nature of tobacco on the vascular system. We discussed the tobacco use would diminish patency of any intervention, and likely significantly worsen progressio of disease. We discussed multiple agents for quitting including replacement therapy or medications to reduce cravings such as Chantix. The patient voices their understanding of the importance of smoking cessation.   Splenic infarction Remote.  No intervention was needed.  Aortic thrombus She had a CT scan performed at the hospital which I have independently reviewed. There shows some mild thoracic aortic plaque without thrombus or stenosis present. This is encouraging. She does need to get her Coumadin under better wraps and I am happy to manage this as she does not have a primary care physician. I would like to check her INR later this week and adjust her dose of Coumadin as needed. This will need to be checked a little more regularly upfront and then hopefully this can be checked every 2-3 months after that.    [name], MD  11/25/2016 5:38 PM    This note was created with Dragon medical transcription system.  Any errors from dictation are purely unintentional

## 2016-11-25 NOTE — Assessment & Plan Note (Signed)
She had a CT scan performed at the hospital which I have independently reviewed. There shows some mild thoracic aortic plaque without thrombus or stenosis present. This is encouraging. She does need to get her Coumadin under better wraps and I am happy to manage this as she does not have a primary care physician. I would like to check her INR later this week and adjust her dose of Coumadin as needed. This will need to be checked a little more regularly upfront and then hopefully this can be checked every 2-3 months after that.

## 2016-11-26 ENCOUNTER — Telehealth: Payer: Self-pay | Admitting: *Deleted

## 2016-11-26 NOTE — Telephone Encounter (Signed)
Patient returned phone call from yesterday to cancer center.  Spoke with patient - She states that Dr. Lucky Cowboy is now managing her INR levels. She is still taking 5 mg of Coumadin. She states that Dr. Bunnie Domino office will be calling to set up new lab apts for this Friday.  Dr. Rogue Bussing made aware that Dr. Lucky Cowboy is managing the lab draws.

## 2016-11-28 ENCOUNTER — Other Ambulatory Visit
Admission: RE | Admit: 2016-11-28 | Discharge: 2016-11-28 | Disposition: A | Discharge status: Home / Self Care | Payer: Medicaid Other | Source: Ambulatory Visit | Attending: Vascular Surgery | Admitting: Vascular Surgery

## 2016-11-28 DIAGNOSIS — I7410 Embolism and thrombosis of unspecified parts of aorta: Secondary | ICD-10-CM | POA: Diagnosis present

## 2016-11-28 LAB — PROTIME-INR
INR: 1.83
PROTHROMBIN TIME: 21.4 s — AB (ref 11.4–15.2)

## 2016-11-29 IMAGING — CT CT CTA ABD/PEL W/CM AND/OR W/O CM
3 of 9 series · 11 of 46 positions shown, 17 images · IV contrast (APPLIED)
Comparison: CT chest, abdomen and pelvis 01/15/2015 and

CLINICAL DATA: 39-year-old female with aortic thrombus currently on
anticoagulation.

EXAM:
CTA ABDOMEN AND PELVIS wITHOUT AND WITH CONTRAST
TECHNIQUE: Multidetector CT imaging of the abdomen and pelvis was performed
using the standard protocol during bolus administration of
intravenous contrast. Multiplanar reconstructed images and MIPs were
obtained and reviewed to evaluate the vascular anatomy.
CONTRAST:  100mL OMNIPAQUE IOHEXOL 350 MG/ML SOLN

[Series 4: arterial · axial · arterial · 0.68mm/px · z∈[-826,-780]mm · 2 of 226 slices shown]
[im 23/226  soft-tissue]
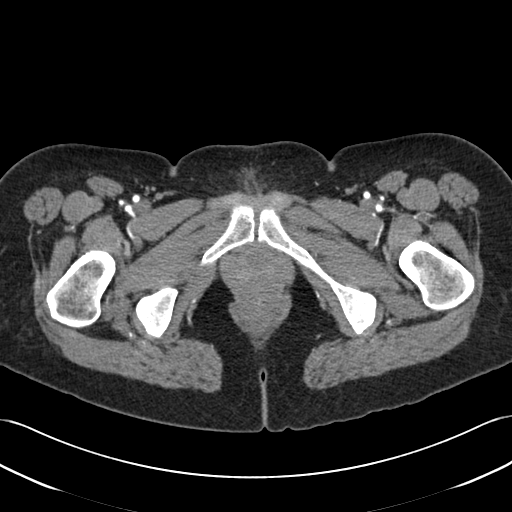
[im 46/226  soft-tissue]
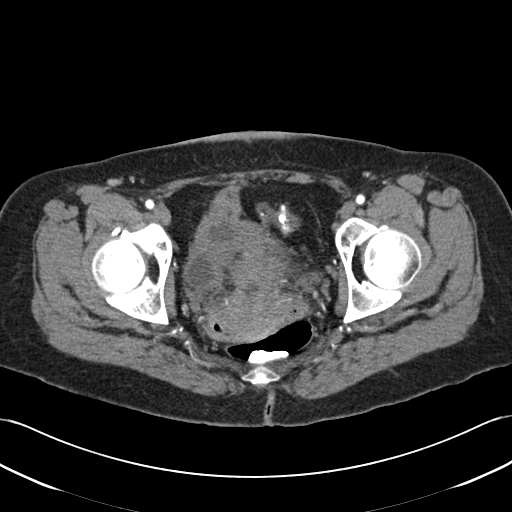

[Series 6: venous · axial · portal-venous · 0.68mm/px · z∈[-815,-480]mm · 7 of 91 slices shown, 12 images]
[im 12/91  soft-tissue]
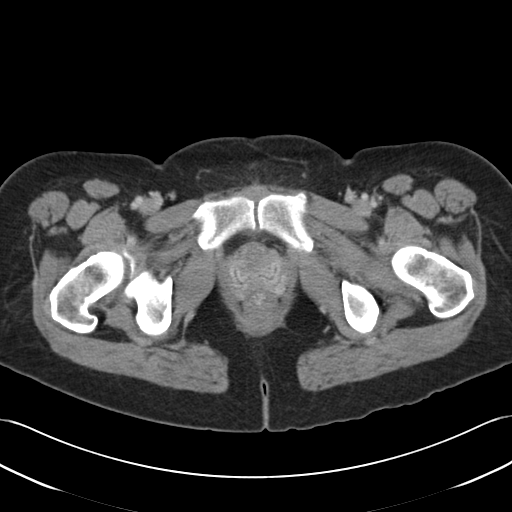
[im 12/91  bone]
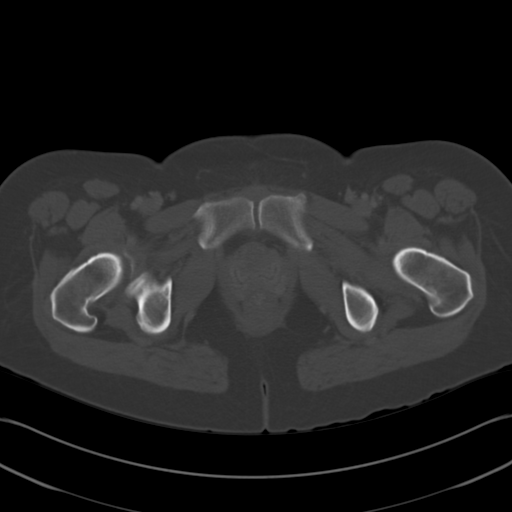
[im 23/91  soft-tissue]
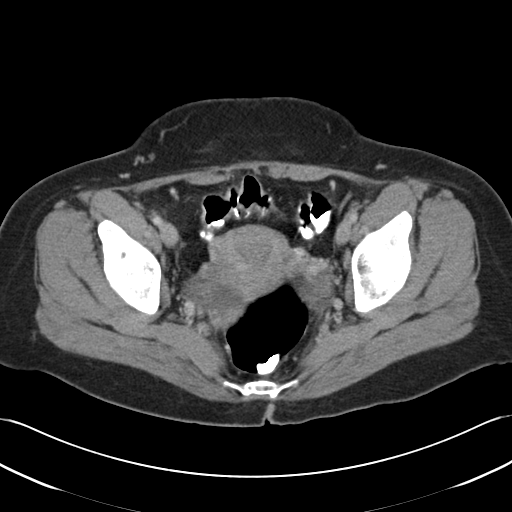
[im 34/91  soft-tissue]
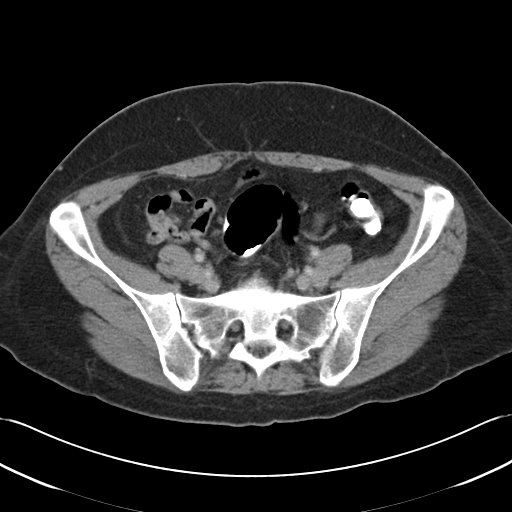
[im 46/91  soft-tissue]
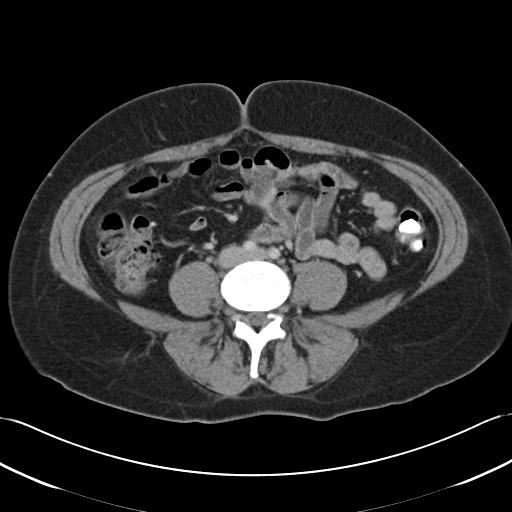
[im 46/91  lung]
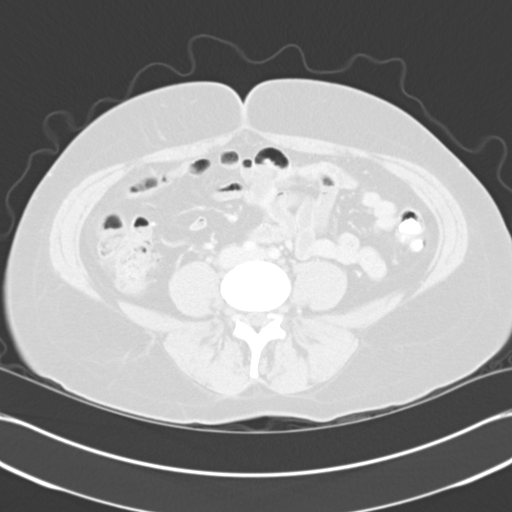
[im 57/91  soft-tissue]
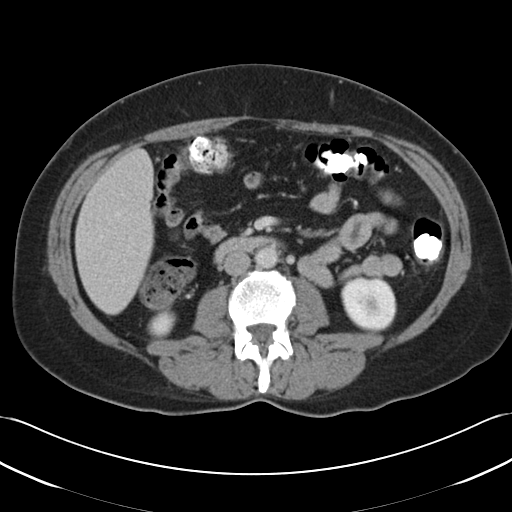
[im 57/91  lung]
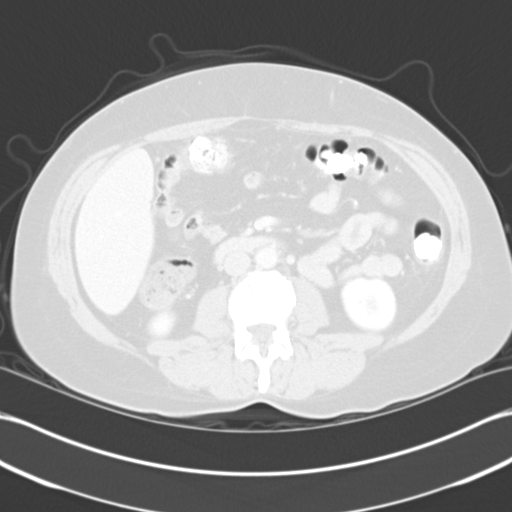
[im 68/91  soft-tissue]
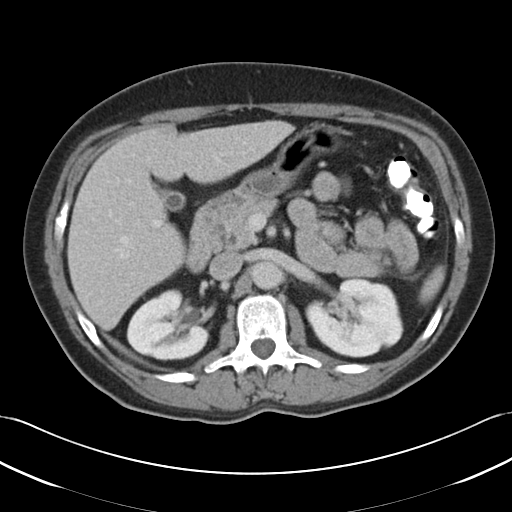
[im 68/91  lung]
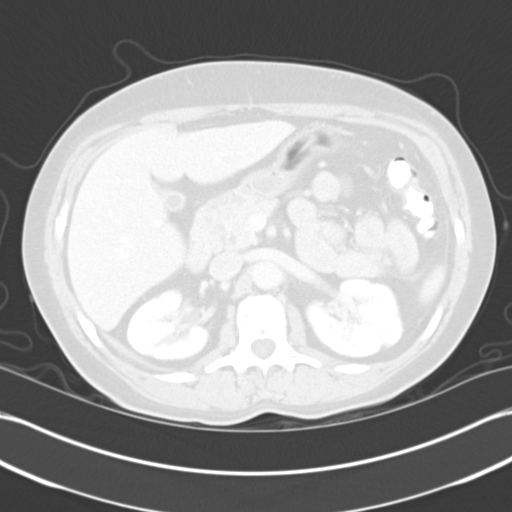
[im 79/91  soft-tissue]
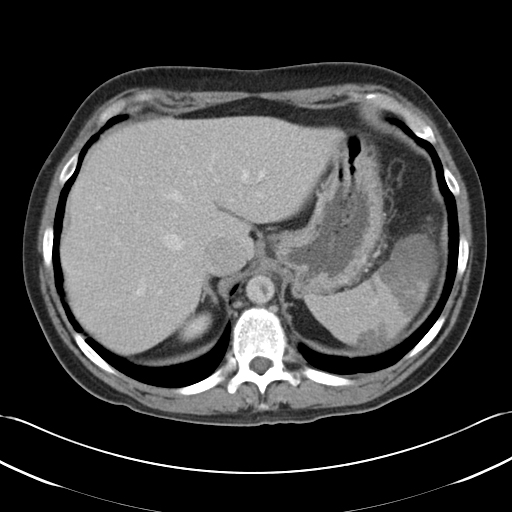
[im 79/91  lung]
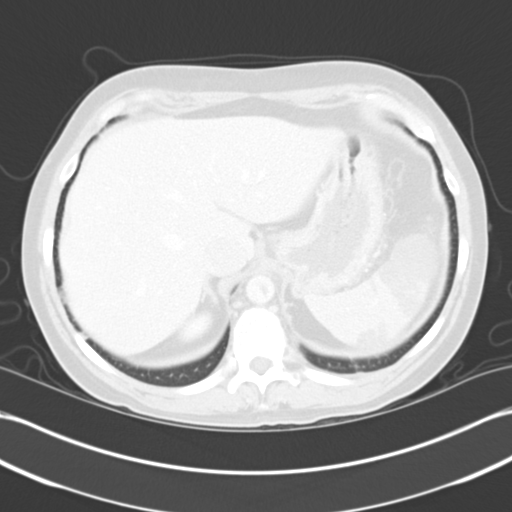

[Series 8: cor arterial mpr · coronal · arterial · 0.71mm/px · 2 of 131 slices shown, 3 images]
[im 44/131  soft-tissue]
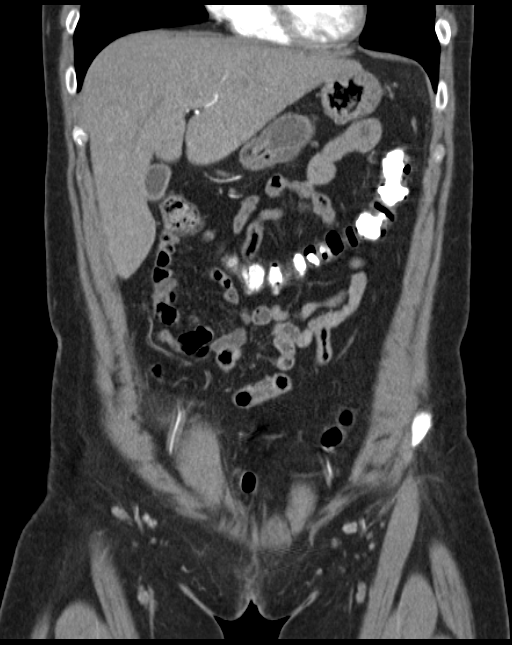
[im 44/131  bone]
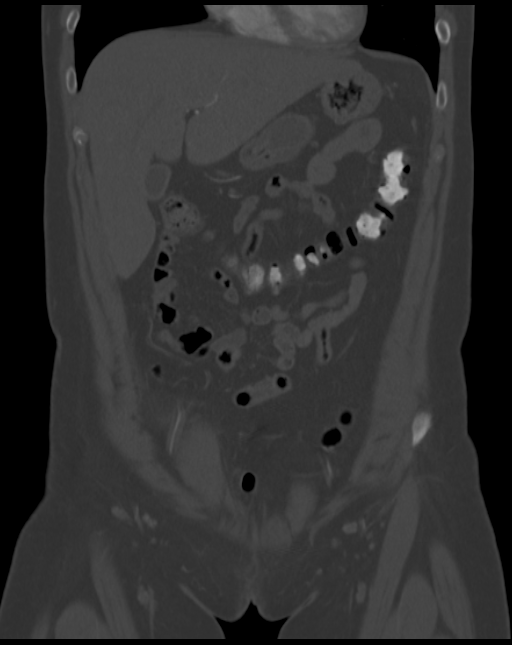
[im 87/131  soft-tissue]
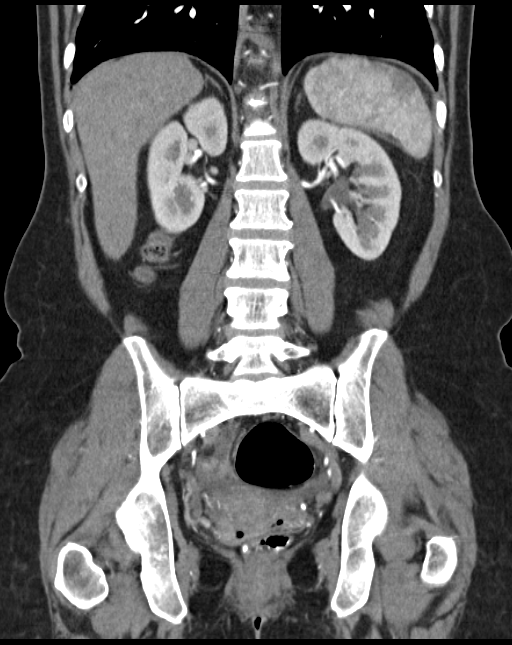

[11 of 46 positions shown; findings below may reference images not displayed]

FINDINGS: VASCULAR

Aorta: Persistent broad-based but pedunculated thrombus within the
descending thoracic aorta. There has been no interval change in the
size or configuration of the thrombus compared to 01/15/2015. The
thrombus is low in attenuation and not enhancing.

Celiac: Widely patent. The left hepatic artery is replaced to the
left gastric artery.

SMA: Widely patent.

Renals: Single renal arteries bilaterally which are widely patent.

IMA: Widely patent.

Inflow: Widely patent and unremarkable.

Proximal Outflow: Widely patent and unremarkable

Veins: No focal venous abnormality.

NON-VASCULAR

Lower Chest: The lung bases are clear. Visualized cardiac structures
are within normal limits for size. No pericardial effusion.
Unremarkable visualized distal thoracic esophagus.

Abdomen: Unremarkable CT appearance of the stomach, duodenum,
adrenal glands and pancreas. Normal hepatic contour morphology. No
discrete hepatic lesion. Geographic hypoattenuation in the left
hemi-liver adjacent to the fissure for the falciform ligament is
nonspecific but most suggestive of benign focal fatty infiltration.
Gallbladder is unremarkable. No intra or extrahepatic biliary ductal
dilatation.

Expected evolution of splenic infarcts with persistent
devascularization in the upper pole and anterior aspect of the
spleen. Unremarkable appearance of the bilateral kidneys. No focal
solid lesion, hydronephrosis or nephrolithiasis. No evidence of
obstruction or focal bowel wall thickening. Normal appendix in the
right lower quadrant. The terminal ileum is unremarkable. No free
fluid or suspicious adenopathy.

Pelvis: Unremarkable bladder, uterus and adnexa. No suspicious
adenopathy.

Bones/Soft Tissues: No acute fracture or aggressive appearing lytic
or blastic osseous lesion.

Review of the MIP images confirms the above findings.
IMPRESSION: VASCULAR

1. There has been no interval change in the size, appearance or
configuration of the broad based but pedunculated bland thrombus
arising from the anterior aspect of the descending thoracic aorta.
2. No new arterial abnormality.
3. Variant hepatic arterial anatomy noted incidentally.
NON VASCULAR

1. Expected evolution of superior and anterior splenic infarcts. No
evidence of new infarct or sequelae of embolic phenomenon.
2. Probable mild focal fatty infiltration along the falciform
ligament of the liver.

## 2017-02-10 ENCOUNTER — Other Ambulatory Visit (INDEPENDENT_AMBULATORY_CARE_PROVIDER_SITE_OTHER): Payer: Self-pay | Admitting: Vascular Surgery

## 2017-02-10 NOTE — Telephone Encounter (Signed)
Approved.  

## 2017-02-17 ENCOUNTER — Ambulatory Visit (INDEPENDENT_AMBULATORY_CARE_PROVIDER_SITE_OTHER): Payer: Medicaid Other | Admitting: Vascular Surgery

## 2017-02-17 ENCOUNTER — Encounter (INDEPENDENT_AMBULATORY_CARE_PROVIDER_SITE_OTHER): Payer: Self-pay | Admitting: Vascular Surgery

## 2017-02-17 VITALS — BP 153/94 | HR 64 | Resp 16 | Ht 67.0 in | Wt 138.0 lb

## 2017-02-17 DIAGNOSIS — F17200 Nicotine dependence, unspecified, uncomplicated: Secondary | ICD-10-CM

## 2017-02-17 DIAGNOSIS — I7410 Embolism and thrombosis of unspecified parts of aorta: Secondary | ICD-10-CM | POA: Diagnosis not present

## 2017-02-17 DIAGNOSIS — F17210 Nicotine dependence, cigarettes, uncomplicated: Secondary | ICD-10-CM | POA: Diagnosis not present

## 2017-02-17 NOTE — Assessment & Plan Note (Signed)
The patient had extensive aortic thrombosis and aortic plaque that was reasonably mild after the thrombosis improved.  This was a spontaneous occurrence and she will remain on long-term anticoagulation.  I think we need to check her INR again and this should be checked every 2-3 months going forward.  She will need to have another CT scan of her chest in about 9 months.  She last had one about 2-3 months ago.  She is going to stop smoking.  She will contact our office with any problems before her next visit.

## 2017-02-17 NOTE — Assessment & Plan Note (Signed)
She understands the importance of tobacco cessation.  She understands this was a primary cause of her pathology in the first place.  She has scheduled a stop date and is congratulated for that.  She is going to do replacement therapy.  We have discussed the many options are available and we will be happy to assist in any way we can.  We discussed this for 3-4 minutes.

## 2017-02-17 NOTE — Progress Notes (Signed)
MRN : [id number]  Suzanne [name] is a 41 y.o. (1976-02-13) female who presents with chief complaint of  Chief Complaint  Patient presents with  . Follow-up    42Yr. No studies  .  History of Present Illness: Patient returns today in follow up of her aortic plaque and thrombus and chronic anticoagulation.  She is feeling better than she was at her last visit.  She continues to smoke but has cut back dramatically.  She has set her stop date for next month. She has not had her INR checked in about a month.  At last check it was just below 2.         Current Outpatient Prescriptions  Medication Sig Dispense Refill  . albuterol (PROVENTIL HFA;VENTOLIN HFA) 108 (90 Base) MCG/ACT inhaler Inhale 2 puffs into the lungs every 6 (six) hours as needed for wheezing or shortness of breath. 1 Inhaler 2  . nicotine (NICODERM CQ) 14 mg/24hr patch Place 1 patch (14 mg total) onto the skin daily. 28 patch 0  . warfarin (COUMADIN) 5 MG tablet Take 1 tablet (5 mg total) by mouth daily. 30 tablet 11  . oxyCODONE (OXY IR/ROXICODONE) 5 MG immediate release tablet Take 1 tablet (5 mg total) by mouth every 8 (eight) hours as needed for severe pain. (Patient not taking: Reported on 02/15/2016) 90 tablet 0   No current facility-administered medications for this visit.         Past Medical History:  Diagnosis Date  . 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 CESAREAN SECTION      Social History       Social History  Substance Use Topics  . Smoking status: Current Some Day Smoker    Packs/day: 0.25    Years: 20.00    Types: Cigarettes  . Smokeless tobacco: Never Used  . Alcohol use 0.6 oz/week     1 Cans of beer per week      Family History      Family History  Problem Relation Age of Onset  . Adopted: Yes     No Known Allergies   REVIEW OF SYSTEMS(Negative unless  checked)  Constitutional: [] Weight loss[] Fever[] Chills Cardiac:[] Chest pain[] Chest pressure[] Palpitations [] Shortness of breath when laying flat [] Shortness of breath at rest [] Shortness of breath with exertion. Vascular: [] Pain in legs with walking[] Pain in legsat rest[] Pain in legs when laying flat [] Claudication [] Pain in feet when walking [] Pain in feet at rest [] Pain in feet when laying flat [] History of DVT [] Phlebitis [] Swelling in legs [] Varicose veins [] Non-healing ulcers Pulmonary: [] Uses home oxygen [] Productive cough[] Hemoptysis [] Wheeze [] COPD [] Asthma Neurologic: [] Dizziness [] Blackouts [] Seizures [] History of stroke [] History of TIA[] Aphasia [] Temporary blindness[] Dysphagia [] Weaknessor numbness in arms [] Weakness or numbnessin legs Musculoskeletal: [] Arthritis [] Joint swelling [] Joint pain [] Low back pain Hematologic:[] Easy bruising[] Easy bleeding [] Hypercoagulable state [] Anemic  Gastrointestinal:[] Blood in stool[] Vomiting blood[] Gastroesophageal reflux/heartburn[x] Abdominal pain Genitourinary: [] Chronic kidney disease [] Difficulturination [] Frequenturination [] Burning with urination[] Hematuria Skin: [] Rashes [] Ulcers [] Wounds Psychological: [] History of anxiety[] History of major depression.   Physical Examination  BP (!) 153/94 (BP Location: Right Arm)   Pulse 64   Resp 16   Ht 5' 7"  (1.702 m)   Wt 62.6 kg (138 lb)   BMI 21.61 kg/m  Gen:  WD/WN, NAD Head: [place]/AT, No temporalis wasting. Ear/Nose/Throat: Hearing grossly intact, nares w/o erythema or drainage, trachea midline Eyes: Conjunctiva clear. Sclera non-icteric Neck: Supple.  No JVD.  Pulmonary:  Good air movement, no use of accessory muscles.  Cardiac: RRR, normal S1, S2 Vascular:  Vessel Right Left  Radial Palpable Palpable                                    Musculoskeletal: M/S  5/5 throughout.  No deformity or atrophy.  Neurologic: Sensation grossly intact in extremities.  Symmetrical.  Speech is fluent.  Psychiatric: Judgment intact, Mood & affect appropriate for pt's clinical situation. Dermatologic: No rashes or ulcers noted.  No cellulitis or open wounds.       Labs Recent Results (from the past 2160 hour(s))  Comprehensive metabolic panel     Status: Abnormal   Collection Time: 11/19/16  1:15 PM  Result Value Ref Range   Sodium 139 135 - 145 mmol/L   Potassium 4.1 3.5 - 5.1 mmol/L   Chloride 106 101 - 111 mmol/L   CO2 21 (L) 22 - 32 mmol/L   Glucose, Bld 117 (H) 65 - 99 mg/dL   BUN <5 (L) 6 - 20 mg/dL   Creatinine, Ser 0.68 0.44 - 1.00 mg/dL   Calcium 8.8 (L) 8.9 - 10.3 mg/dL   Total Protein 7.5 6.5 - 8.1 g/dL   Albumin 4.0 3.5 - 5.0 g/dL   AST 33 15 - 41 U/L   ALT 20 14 - 54 U/L   Alkaline Phosphatase 77 38 - 126 U/L   Total Bilirubin 0.5 0.3 - 1.2 mg/dL   GFR calc non Af Amer >60 >60 mL/min   GFR calc Af Amer >60 >60 mL/min    Comment: (NOTE) The eGFR has been calculated using the CKD EPI equation. This calculation has not been validated in all clinical situations. eGFR's persistently <60 mL/min signify possible Chronic Kidney Disease.    Anion gap 12 5 - 15  CBC     Status: Abnormal   Collection Time: 11/19/16  1:15 PM  Result Value Ref Range   WBC 11.1 (H) 3.6 - 11.0 K/uL   RBC 4.76 3.80 - 5.20 MIL/uL   Hemoglobin 15.5 12.0 - 16.0 g/dL   HCT 45.3 35.0 - 47.0 %   MCV 95.2 80.0 - 100.0 fL   MCH 32.6 26.0 - 34.0 pg   MCHC 34.3 32.0 - 36.0 g/dL   RDW 13.3 11.5 - 14.5 %   Platelets 452 (H) 150 - 440 K/uL  Protime-INR     Status: Abnormal   Collection Time: 11/21/16 12:06 AM  Result Value Ref Range   Prothrombin Time 45.3 (H) 11.4 - 15.2 seconds   INR 4.67 (HH)     Comment: CRITICAL RESULT CALLED TO, READ BACK BY AND VERIFIED WITH: KAITLIN KIZZIAH AT 0100 ON 11/21/16 RWW   Troponin I     Status: None   Collection Time: 11/21/16  12:06 AM  Result Value Ref Range   Troponin I <0.03 <0.03 ng/mL  Protime-INR     Status: Abnormal   Collection Time: 11/22/16 12:42 PM  Result Value Ref Range   Prothrombin Time 22.4 (H) 11.4 - 15.2 seconds   INR 1.94   INR/PT     Status: Abnormal   Collection Time: 11/28/16 11:14 AM  Result Value Ref Range   Prothrombin Time 21.4 (H) 11.4 - 15.2 seconds   INR 1.83     Radiology No results found.   Assessment/Plan  Tobacco dependence She understands the importance of tobacco cessation.  She understands  this was a primary cause of her pathology in the first place.  She has scheduled a stop date and is congratulated for that.  She is going to do replacement therapy.  We have discussed the many options are available and we will be happy to assist in any way we can.  We discussed this for 3-4 minutes.  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  This was a spontaneous occurrence and she will remain on long-term anticoagulation.  I think we need to check her INR again and this should be checked every 2-3 months going forward.  She will need to have another CT scan of her chest in about 9 months.  She last had one about 2-3 months ago.  She is going to stop smoking.  She will contact our office with any problems before her next visit.    [name], MD  02/17/2017 1:08 PM    This note was created with Dragon medical transcription system.  Any errors from dictation are purely unintentional

## 2017-03-17 ENCOUNTER — Other Ambulatory Visit (INDEPENDENT_AMBULATORY_CARE_PROVIDER_SITE_OTHER): Payer: Self-pay | Admitting: Vascular Surgery

## 2017-03-17 NOTE — Telephone Encounter (Signed)
Patient should have INR levels checked at least once a month by her primary lab/doctor

## 2017-04-03 ENCOUNTER — Emergency Department
Admission: EM | Admit: 2017-04-03 | Discharge: 2017-04-03 | Disposition: A | Discharge status: Home / Self Care | Payer: Self-pay

## 2017-04-03 ENCOUNTER — Other Ambulatory Visit (INDEPENDENT_AMBULATORY_CARE_PROVIDER_SITE_OTHER): Payer: Self-pay

## 2017-04-03 ENCOUNTER — Telehealth (INDEPENDENT_AMBULATORY_CARE_PROVIDER_SITE_OTHER): Payer: Self-pay

## 2017-04-03 ENCOUNTER — Other Ambulatory Visit
Admission: RE | Admit: 2017-04-03 | Discharge: 2017-04-03 | Disposition: A | Discharge status: Home / Self Care | Payer: Medicaid Other | Source: Ambulatory Visit | Attending: Vascular Surgery | Admitting: Vascular Surgery

## 2017-04-03 DIAGNOSIS — I7410 Embolism and thrombosis of unspecified parts of aorta: Secondary | ICD-10-CM

## 2017-04-03 LAB — PROTIME-INR
INR: 2.18
Prothrombin Time: 24.1 seconds — ABNORMAL HIGH (ref 11.4–15.2)

## 2017-04-03 NOTE — Telephone Encounter (Signed)
The patient's last INR in epic was:   Ref. Range 11/28/2016 11:14  INR Unknown 1.83   She is still taking Coumadin?  Who has been following her INR? Not having her INR checked this infrequently is appropriate.  If she is having severe headaches does not know what her INR is - my advice is for her to be assessed in the emergency department.  Once she is assessed and there is no critical/bleeding issues I will be more than happy to order weekly INR is to check her Coumadin level.

## 2017-04-03 NOTE — Telephone Encounter (Signed)
Called the patient back to let her know what was advised for her to do. She states that she is going to go to the ED once she is done shopping for her son's shoes for his birthday. She also states that she is going to tell the ED that Dr. Lucky Cowboy wants her to have her levels checked.

## 2017-04-03 NOTE — Telephone Encounter (Signed)
Patient called and stated that Dr. Lucky Cowboy said that he was going to put in an order for her PT/INR to be checked back a few months ago. So I checked the chart, and there does appear to be an order in there from 02/17/2017.  The patients states that she is having frequent migraines, and just not feeling well at all and she would like to go do the blood work and she is requesting that the lab order be put in so that she can have the levels checked STAT.

## 2017-09-09 ENCOUNTER — Other Ambulatory Visit (INDEPENDENT_AMBULATORY_CARE_PROVIDER_SITE_OTHER): Payer: Self-pay | Admitting: Vascular Surgery

## 2017-09-09 ENCOUNTER — Other Ambulatory Visit
Admission: RE | Admit: 2017-09-09 | Discharge: 2017-09-09 | Disposition: A | Discharge status: Home / Self Care | Payer: Medicaid Other | Source: Ambulatory Visit | Attending: Vascular Surgery | Admitting: Vascular Surgery

## 2017-09-09 ENCOUNTER — Telehealth (INDEPENDENT_AMBULATORY_CARE_PROVIDER_SITE_OTHER): Payer: Self-pay | Admitting: Vascular Surgery

## 2017-09-09 DIAGNOSIS — D735 Infarction of spleen: Secondary | ICD-10-CM

## 2017-09-09 LAB — PROTIME-INR
INR: 3.6
PROTHROMBIN TIME: 35.6 s — AB (ref 11.4–15.2)

## 2017-09-09 NOTE — Telephone Encounter (Signed)
I did call and inform the patient with Suzanne [name] recommended for her to go to the ER for medical attention and she stated if she felt like she needed to go to the ER she would have went on Monday she was more concern with her inr check because she stated she felt fine.The patient stated that she needed to see the provider on a regular basis to have her inr check but I did inform her that Suzanne [name] had put the order in and she need to go today to get her inr levels check.Also with her going to ER to evaluate she will see because she did not want to be in there a long time.

## 2017-09-09 NOTE — Telephone Encounter (Signed)
I strongly recommend that the patient seek medical attention immediately in the emergency department.  Myocardial infarction or a "heart attack" can be a life-threatening event. Drawing an INR will not let the patient know if she had / or is having a heart attack. If the patient seeks medical attention the INR can be drawn in the ED.  If she does not seek medical attention, which I strongly do not recommend I will still place an order in the computer for her to have an INR drawn.  She should go as soon as possible to have it drawn.

## 2017-09-09 NOTE — Telephone Encounter (Signed)
This patient called this morning and stated that she thinks that she had a mild heart attack Monday . She wants to know when Dr. Lucky Cowboy wants her to come in and get her "ink levels " checked?

## 2017-09-09 NOTE — Telephone Encounter (Signed)
Please advise 

## 2017-11-19 ENCOUNTER — Telehealth (INDEPENDENT_AMBULATORY_CARE_PROVIDER_SITE_OTHER): Payer: Self-pay | Admitting: Vascular Surgery

## 2017-11-19 ENCOUNTER — Other Ambulatory Visit (INDEPENDENT_AMBULATORY_CARE_PROVIDER_SITE_OTHER): Payer: Self-pay | Admitting: Vascular Surgery

## 2017-11-19 NOTE — Telephone Encounter (Signed)
[place]

## 2017-11-19 NOTE — Telephone Encounter (Signed)
[place]

## 2017-11-23 ENCOUNTER — Ambulatory Visit: Admission: RE | Admit: 2017-11-23 | Payer: Medicaid Other | Source: Ambulatory Visit

## 2017-12-18 ENCOUNTER — Ambulatory Visit (INDEPENDENT_AMBULATORY_CARE_PROVIDER_SITE_OTHER): Payer: Medicaid Other | Admitting: Vascular Surgery

## 2017-12-18 ENCOUNTER — Encounter (INDEPENDENT_AMBULATORY_CARE_PROVIDER_SITE_OTHER): Payer: Self-pay | Admitting: Vascular Surgery

## 2017-12-18 VITALS — BP 138/91 | HR 58 | Resp 16 | Ht 67.0 in | Wt 155.6 lb

## 2017-12-18 DIAGNOSIS — F17200 Nicotine dependence, unspecified, uncomplicated: Secondary | ICD-10-CM

## 2017-12-18 DIAGNOSIS — E785 Hyperlipidemia, unspecified: Secondary | ICD-10-CM | POA: Diagnosis not present

## 2017-12-18 DIAGNOSIS — I7410 Embolism and thrombosis of unspecified parts of aorta: Secondary | ICD-10-CM | POA: Diagnosis not present

## 2017-12-18 NOTE — Assessment & Plan Note (Signed)
Recently diagnosed by her primary care physician.  Has not started her statin yet as wanted to discuss this with me.  I do think that is a reasonable option because prevention of further plaque would certainly be beneficial to her vascular system.

## 2017-12-18 NOTE — Progress Notes (Signed)
MRN : [id number]  Suzanne [name] is a 42 y.o. ([birth year]) female who presents with chief complaint of  Chief Complaint  Patient presents with  . Follow-up    h&p update  .  History of Present Illness: Patient returns today in follow up of her aortic thrombus and chronic anticoagulation.  She feels well.  She is having some headaches.  She has gotten a primary care physician which is encouraging.  Her INR levels have been reasonably stable.  She has not gotten her CT scan as it has not been approved by insurance, but will need this to further assess her aortic thrombus.  Current Outpatient Medications  Medication Sig Dispense Refill  . albuterol (PROVENTIL HFA;VENTOLIN HFA) 108 (90 Base) MCG/ACT inhaler Inhale 2 puffs into the lungs every 6 (six) hours as needed for wheezing or shortness of breath. 1 Inhaler 2  . nicotine (NICODERM CQ) 14 mg/24hr patch Place 1 patch (14 mg total) onto the skin daily. 28 patch 0  . warfarin (COUMADIN) 5 MG tablet TAKE 1 TABLET(5 MG) BY MOUTH DAILY 30 tablet 6  . oxyCODONE (OXY IR/ROXICODONE) 5 MG immediate release tablet Take 1 tablet (5 mg total) by mouth every 8 (eight) hours as needed for severe pain. (Patient not taking: Reported on 02/15/2016) 90 tablet 0   No current facility-administered medications for this visit.     Past Medical History:  Diagnosis Date  . 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 CESAREAN SECTION      Social History  Substance Use Topics  . Smoking status: Current Some Day Smoker    Packs/day: 0.25    Years: 20.00    Types: Cigarettes  . Smokeless tobacco: Never Used  . Alcohol use 0.6 oz/week     1 Cans of beer per week      Family History      Family History  Problem Relation Age of Onset  . Adopted: Yes     No Known Allergies   REVIEW OF SYSTEMS(Negative unless checked)  Constitutional: [] Weight  loss[] Fever[] Chills Cardiac:[] Chest pain[] Chest pressure[] Palpitations [] Shortness of breath when laying flat [] Shortness of breath at rest [] Shortness of breath with exertion. Vascular: [] Pain in legs with walking[] Pain in legsat rest[] Pain in legs when laying flat [] Claudication [] Pain in feet when walking [] Pain in feet at rest [] Pain in feet when laying flat [] History of DVT [] Phlebitis [] Swelling in legs [] Varicose veins [] Non-healing ulcers Pulmonary: [] Uses home oxygen [] Productive cough[] Hemoptysis [] Wheeze [] COPD [] Asthma Neurologic: [] Dizziness [] Blackouts [] Seizures [] History of stroke [] History of TIA[] Aphasia [] Temporary blindness[] Dysphagia [] Weaknessor numbness in arms [] Weakness or numbnessin legs Musculoskeletal: [] Arthritis [] Joint swelling [] Joint pain [] Low back pain Hematologic:[] Easy bruising[] Easy bleeding [] Hypercoagulable state [] Anemic  Gastrointestinal:[] Blood in stool[] Vomiting blood[] Gastroesophageal reflux/heartburn[x] Abdominal pain Genitourinary: [] Chronic kidney disease [] Difficulturination [] Frequenturination [] Burning with urination[] Hematuria Skin: [] Rashes [] Ulcers [] Wounds Psychological: [] History of anxiety[] History of major depression.    Physical Examination  BP (!) 138/91 (BP Location: Right Arm)   Pulse (!) 58   Resp 16   Ht 5\' 7"  (1.702 m)   Wt 155 lb 9.6 oz (70.6 kg)   BMI 24.37 kg/m  Gen:  WD/WN, NAD Head: [place]/AT, No temporalis wasting. Ear/Nose/Throat: Hearing grossly intact, nares w/o erythema or drainage Eyes: Conjunctiva clear. Sclera non-icteric Neck: Supple.  Trachea midline Pulmonary:  Good air movement, no use of accessory muscles.  Cardiac: RRR, no JVD Vascular:  Vessel Right Left  Radial Palpable Palpable  PT Palpable Palpable  DP Palpable Palpable    Musculoskeletal: M/S 5/5  throughout.  No deformity or atrophy.  No edema. Neurologic: Sensation grossly intact in extremities.  Symmetrical.  Speech is fluent.  Psychiatric: Judgment intact, Mood & affect appropriate for pt's clinical situation. Dermatologic: No rashes or ulcers noted.  No cellulitis or open wounds.       Labs No results found for this or any previous visit (from the past 2160 hour(s)).  Radiology No results found.  Assessment/Plan  Aortic thrombus The patient needs a CT scan of the chest to follow-up her aortic thrombus and plaque.  This cannot be seen on ultrasound.  She will continue her anticoagulation.  I will see her back following her CT scan.  Hyperlipidemia Recently diagnosed by her primary care physician.  Has not started her statin yet as wanted to discuss this with me.  I do think that is a reasonable option because prevention of further plaque would certainly be beneficial to her vascular system.    [name], MD  12/18/2017 11:09 AM    This note was created with Dragon medical transcription system.  Any errors from dictation are purely unintentional

## 2017-12-18 NOTE — Patient Instructions (Signed)
Vitamin K Foods and Warfarin Warfarin is a blood thinner (anticoagulant). Anticoagulant medicines help prevent the formation of blood clots. These medicines work by decreasing the activity of vitamin K, which promotes normal blood clotting. When you take warfarin, problems can occur from suddenly increasing or decreasing the amount of vitamin K that you eat from one day to the next. Problems may include:  Blood clots.  Bleeding. What general guidelines do I need to follow? To avoid problems when taking warfarin:  Eat a balanced diet that includes:  Fresh fruits and vegetables.  Whole grains.  Low-fat dairy products.  Lean proteins, such as fish, eggs, and lean cuts of meat.  Keep your intake of vitamin K consistent from day to day. To do this:  Avoid eating large amounts of vitamin K one day and low amounts of vitamin K the next day.  If you take a multivitamin that contains vitamin K, be sure to take it every day.  Know which foods contain vitamin K. Use the lists below to understand serving sizes and the amount of vitamin K in one serving.  Avoid major changes in your diet. If you are going to change your diet, talk with your health care provider before making changes.  Work with a nutrition specialist (dietitian) to develop a meal plan that works best for you. High vitamin K foods Foods that are high in vitamin K contain more than 100 mcg (micrograms) per serving. These include:  Broccoli (cooked) -  cup has 110 mcg.  Brussels sprouts (cooked) -  cup has 109 mcg.  Greens, beet (cooked) -  cup has 350 mcg.  Greens, collard (cooked) -  cup has 418 mcg.  Greens, turnip (cooked) -  cup has 265 mcg.  Green onions or scallions -  cup has 105 mcg.  Kale (fresh or frozen) -  cup has 531 mcg.  Parsley (raw) - 10 sprigs has 164 mcg.  Spinach (cooked) -  cup has 444 mcg.  Swiss chard (cooked) -  cup has 287 mcg. Moderate vitamin K foods Foods that have a  moderate amount of vitamin K contain 25-100 mcg per serving. These include:  Asparagus (cooked) - 5 spears have 38 mcg.  Black-eyed peas (dried) -  cup has 32 mcg.  Cabbage (cooked) -  cup has 37 mcg.  Kiwi fruit - 1 medium has 31 mcg.  Lettuce - 1 cup has 57-63 mcg.  Okra (frozen) -  cup has 44 mcg.  Prunes (dried) - 5 prunes have 25 mcg.  Watercress (raw) - 1 cup has 85 mcg. Low vitamin K foods Foods low in vitamin K contain less than 25 mcg per serving. These include:  Artichoke - 1 medium has 18 mcg.  Avocado - 1 oz. has 6 mcg.  Blueberries -  cup has 14 mcg.  Cabbage (raw) -  cup has 21 mcg.  Carrots (cooked) -  cup has 11 mcg.  Cauliflower (raw) -  cup has 11 mcg.  Cucumber with peel (raw) -  cup has 9 mcg.  Grapes -  cup has 12 mcg.  Mango - 1 medium has 9 mcg.  Nuts - 1 oz. has 15 mcg.  Pear - 1 medium has 8 mcg.  Peas (cooked) -  cup has 19 mcg.  Pickles - 1 spear has 14 mcg.  Pumpkin seeds - 1 oz. has 13 mcg.  Sauerkraut (canned) -  cup has 16 mcg.  Soybeans (cooked) -  cup has 16 mcg.    has 16 mcg.  Tomato (raw) - 1 medium has 10 mcg.  Tomato sauce -  cup has 17 mcg.  Vitamin K-free foods If a food contain less than 5 mcg per serving, it is considered to have no vitamin K. These foods include:  Bread and cereal products.  Cheese.  Eggs.  Fish and shellfish.  Meat and poultry.  Milk and dairy products.  Sunflower seeds.  Actual amounts of vitamin K in foods may be different depending on processing. Talk with your dietitian about what foods you can eat and what foods you should avoid. This information is not intended to replace advice given to you by your health care provider. Make sure you discuss any questions you have with your health care provider. Document Released: 02/02/2009 Document Revised: 10/28/2015 Document Reviewed: 07/11/2015 Elsevier Interactive Patient Education  [employer].

## 2017-12-18 NOTE — Assessment & Plan Note (Signed)
The patient needs a CT scan of the chest to follow-up her aortic thrombus and plaque.  This cannot be seen on ultrasound.  She will continue her anticoagulation.  I will see her back following her CT scan.

## 2017-12-31 ENCOUNTER — Other Ambulatory Visit (INDEPENDENT_AMBULATORY_CARE_PROVIDER_SITE_OTHER): Payer: Self-pay | Admitting: Vascular Surgery

## 2017-12-31 NOTE — Telephone Encounter (Signed)
Patient needs to make an appointment with the doctor that is managing her Coumadin levels for future refills.

## 2018-03-02 ENCOUNTER — Other Ambulatory Visit (INDEPENDENT_AMBULATORY_CARE_PROVIDER_SITE_OTHER): Payer: Self-pay | Admitting: Vascular Surgery

## 2018-03-03 NOTE — Telephone Encounter (Signed)
Are you willing to refill this patient's Coumadin? Or should she contact her primary care to get this medication refilled?

## 2018-03-29 ENCOUNTER — Other Ambulatory Visit (INDEPENDENT_AMBULATORY_CARE_PROVIDER_SITE_OTHER): Payer: Self-pay | Admitting: Vascular Surgery

## 2018-04-12 ENCOUNTER — Telehealth (INDEPENDENT_AMBULATORY_CARE_PROVIDER_SITE_OTHER): Payer: Self-pay

## 2018-04-12 NOTE — Telephone Encounter (Signed)
Patient called and left a message stating that her PCP is following her INR but she has been unable to contact her PCP so she called our office. The patient has an INR of 1.3 and in our system, in her medications she is listed as taking 1mg  and 5mg . I have attempted to contact the patient to get the correct dosage that she takes and I am unable to make contact with the patient. The patient has a voicemail box that is full, I attempted to contact the patient's mother who is her Emergency contact and that voicemail box is full as well.

## 2018-04-15 NOTE — Telephone Encounter (Signed)
Patient called and left a message on the triage line and stated that she was returning a call about her INR, per previous message below from [place] her INR was 1.3. Unable to confirm her dosage at this time. Left voicemail to call office back

## 2018-05-03 ENCOUNTER — Other Ambulatory Visit (INDEPENDENT_AMBULATORY_CARE_PROVIDER_SITE_OTHER): Payer: Self-pay | Admitting: Vascular Surgery

## 2018-05-14 ENCOUNTER — Ambulatory Visit (INDEPENDENT_AMBULATORY_CARE_PROVIDER_SITE_OTHER): Payer: Self-pay | Admitting: Vascular Surgery

## 2018-06-07 ENCOUNTER — Telehealth (INDEPENDENT_AMBULATORY_CARE_PROVIDER_SITE_OTHER): Payer: Self-pay | Admitting: Vascular Surgery

## 2018-06-08 NOTE — Telephone Encounter (Signed)
Patient had a f/u on 05/14/2018 for a INR recheck and medication. Patient no showed for this appt. See note below.

## 2018-06-08 NOTE — Telephone Encounter (Signed)
Spoke with the patient, and let her know the Warfarin will be call into her pharmacy. I also advised the patient to have her INR check in 4-5 days as well as making an appt in our office. The patient requested that the prescription be called into [address]. A prescription for Warfarin 1mg , #90 with 1 refill was called into the pharmacy.

## 2018-08-09 ENCOUNTER — Other Ambulatory Visit (INDEPENDENT_AMBULATORY_CARE_PROVIDER_SITE_OTHER): Payer: Self-pay | Admitting: Vascular Surgery

## 2018-08-10 NOTE — Telephone Encounter (Signed)
At this time the patient has no insurance and she was being check by PCP Dr Eda Paschal but has not been check in few months

## 2018-08-10 NOTE — Telephone Encounter (Signed)
The big question is who is managing and checking her INR? That is who should be prescribing this.

## 2018-08-10 NOTE — Telephone Encounter (Signed)
Patient was instructed to make an appointment in our office after her last refill request. Unfortunately she no showed for that apt. Please advise this rx refill

## 2018-08-10 NOTE — Telephone Encounter (Signed)
Please contact patient and find out who is checking her INR per Arna Medici? The healthcare provider that's monitoring her INR should refill her warfarin. If she want's [facility] to monitor it she will need to schedule an apt. and labs.

## 2018-08-12 ENCOUNTER — Other Ambulatory Visit (INDEPENDENT_AMBULATORY_CARE_PROVIDER_SITE_OTHER): Payer: Self-pay | Admitting: Vascular Surgery

## 2018-08-12 ENCOUNTER — Other Ambulatory Visit (INDEPENDENT_AMBULATORY_CARE_PROVIDER_SITE_OTHER): Payer: Self-pay | Admitting: Nurse Practitioner

## 2018-08-12 MED ORDER — WARFARIN SODIUM 1 MG PO TABS: 1.0000 mg | ORAL_TABLET | Freq: Every day | ORAL | 0 refills | Status: DC

## 2018-08-13 NOTE — Telephone Encounter (Signed)
This patient isn't currently being monitored. Please see April's note below.

## 2018-09-09 ENCOUNTER — Other Ambulatory Visit (INDEPENDENT_AMBULATORY_CARE_PROVIDER_SITE_OTHER): Payer: Self-pay | Admitting: Nurse Practitioner

## 2018-11-12 ENCOUNTER — Telehealth (INDEPENDENT_AMBULATORY_CARE_PROVIDER_SITE_OTHER): Payer: Self-pay | Admitting: Vascular Surgery

## 2018-11-12 ENCOUNTER — Other Ambulatory Visit (INDEPENDENT_AMBULATORY_CARE_PROVIDER_SITE_OTHER): Payer: Self-pay | Admitting: Vascular Surgery

## 2018-11-12 NOTE — Telephone Encounter (Signed)
The Coumadin was last filled by you on 07/2018. I did attempt to explain to the patient that you writing a prescription was not going to change the price of the medication.

## 2018-11-12 NOTE — Telephone Encounter (Signed)
A prescription was called in for the patient for coumadin 1mg  take one tab by mouth daily no refills.

## 2018-11-12 NOTE — Telephone Encounter (Signed)
PT CALLED IN REQ RX FOR BLOOD THINNER. PCP CHARGING TO [facility] FOR HER TO GET THROUGH HIM. WANTING TO KNOW IF DR DEW WOULD CALL IN A 30 DAY SUPPLY.

## 2018-12-10 ENCOUNTER — Other Ambulatory Visit (INDEPENDENT_AMBULATORY_CARE_PROVIDER_SITE_OTHER): Payer: Self-pay | Admitting: Vascular Surgery

## 2018-12-10 ENCOUNTER — Telehealth (INDEPENDENT_AMBULATORY_CARE_PROVIDER_SITE_OTHER): Payer: Self-pay | Admitting: Vascular Surgery

## 2018-12-10 NOTE — Telephone Encounter (Signed)
I called the patient back and gave her the information below. I advised patient that she needed to have her INR checked to make sure we were within reasonable range. I advised patient we would send in 30 day supply of her medication but that without her INR being checked this may be the last time we can do this. I advised patient we would be putting in an order to [facility] lab for blood draw to see if they were any cheaper than the $175 she is quoting for her lab draw at her PCP office. Patient was very combative on the phone and was shouting and cussing at me stating that she couldn't get her f*ing labs drawn and that she didn't need this s*t from me. That she was going through a divorce and that her ex had taken everything. I told the patient that I was sorry for what she was going through and that I was trying to help her and to please not raise her voice at me and the patient hung the phone up. AS, CMA  30 day supply of Coumadin has been sent to patient pharmacy.

## 2018-12-10 NOTE — Telephone Encounter (Signed)
Patient is aware she needs INR drawn

## 2018-12-10 NOTE — Telephone Encounter (Signed)
Patient request refill for Coumadin. Per last message in July we were only giving her a 1 month supply. Patient was advised back in July that she needs to contact her PCP for further refills. PCP has been prescribing meds before July.   I called patient to reiterate last conversation. Patient states that she can not go to her PCP for her Coumadin because she cant afford to have her blood work checked and that she hasnt had her labs drawn for her INR in about a year. I advised patient that she has to have labs drawn to help regulate INR levels and patient stated she couldn't do that because she cant afford anything because she is going through a divorce and is out of money.  I advised patient I would speak with you and ask what you wanted to do.

## 2018-12-17 ENCOUNTER — Other Ambulatory Visit: Payer: Self-pay

## 2018-12-17 ENCOUNTER — Encounter: Payer: Self-pay | Admitting: Emergency Medicine

## 2018-12-17 ENCOUNTER — Emergency Department
Admission: EM | Admit: 2018-12-17 | Discharge: 2018-12-17 | Disposition: A | Discharge status: Home / Self Care | Payer: Medicaid Other | Attending: Emergency Medicine | Admitting: Emergency Medicine

## 2018-12-17 DIAGNOSIS — Z5321 Procedure and treatment not carried out due to patient leaving prior to being seen by health care provider: Secondary | ICD-10-CM | POA: Insufficient documentation

## 2018-12-17 DIAGNOSIS — M79606 Pain in leg, unspecified: Secondary | ICD-10-CM | POA: Insufficient documentation

## 2018-12-17 LAB — CBC WITH DIFFERENTIAL/PLATELET
Abs Immature Granulocytes: 0.08 10*3/uL — ABNORMAL HIGH (ref 0.00–0.07)
Basophils Absolute: 0.1 10*3/uL (ref 0.0–0.1)
Basophils Relative: 0 %
Eosinophils Absolute: 0.1 10*3/uL (ref 0.0–0.5)
Eosinophils Relative: 1 %
HCT: 41.9 % (ref 36.0–46.0)
Hemoglobin: 14.3 g/dL (ref 12.0–15.0)
Immature Granulocytes: 1 %
Lymphocytes Relative: 23 %
Lymphs Abs: 3.7 10*3/uL (ref 0.7–4.0)
MCH: 30.6 pg (ref 26.0–34.0)
MCHC: 34.1 g/dL (ref 30.0–36.0)
MCV: 89.5 fL (ref 80.0–100.0)
Monocytes Absolute: 1.2 10*3/uL — ABNORMAL HIGH (ref 0.1–1.0)
Monocytes Relative: 8 %
Neutro Abs: 10.8 10*3/uL — ABNORMAL HIGH (ref 1.7–7.7)
Neutrophils Relative %: 67 %
Platelets: 408 10*3/uL — ABNORMAL HIGH (ref 150–400)
RBC: 4.68 MIL/uL (ref 3.87–5.11)
RDW: 14.3 % (ref 11.5–15.5)
WBC: 16.1 10*3/uL — ABNORMAL HIGH (ref 4.0–10.5)
nRBC: 0 % (ref 0.0–0.2)

## 2018-12-17 NOTE — ED Triage Notes (Signed)
Patient states that she had a sudden sharpe pain in her left lower abdomen that lasted a few minutes about 13:00 today. Patient states that since that time she has been having pain from her lower back radiating down bilateral legs. Patient denies any injury.

## 2018-12-18 LAB — COMPREHENSIVE METABOLIC PANEL
ALT: 23 U/L (ref 0–44)
AST: 33 U/L (ref 15–41)
Albumin: 3.5 g/dL (ref 3.5–5.0)
Alkaline Phosphatase: 103 U/L (ref 38–126)
Anion gap: 11 (ref 5–15)
BUN: <5 mg/dL — ABNORMAL LOW (ref 6–20)
CO2: 21 mmol/L — ABNORMAL LOW (ref 22–32)
Calcium: 8.5 mg/dL — ABNORMAL LOW (ref 8.9–10.3)
Chloride: 102 mmol/L (ref 98–111)
Creatinine, Ser: 0.64 mg/dL (ref 0.44–1.00)
GFR calc Af Amer: >60 mL/min (ref >60)
GFR calc non Af Amer: >60 mL/min (ref >60)
Glucose, Bld: 108 mg/dL — ABNORMAL HIGH (ref 70–99)
Potassium: 3.5 mmol/L (ref 3.5–5.1)
Sodium: 134 mmol/L — ABNORMAL LOW (ref 135–145)
Total Bilirubin: 0.6 mg/dL (ref 0.3–1.2)
Total Protein: 7.4 g/dL (ref 6.5–8.1)

## 2018-12-18 LAB — TROPONIN I (HIGH SENSITIVITY): Troponin I (High Sensitivity): 5 ng/L (ref <18)

## 2018-12-20 ENCOUNTER — Telehealth (INDEPENDENT_AMBULATORY_CARE_PROVIDER_SITE_OTHER): Payer: Self-pay

## 2018-12-20 ENCOUNTER — Other Ambulatory Visit (INDEPENDENT_AMBULATORY_CARE_PROVIDER_SITE_OTHER): Payer: Self-pay | Admitting: Nurse Practitioner

## 2018-12-20 NOTE — Telephone Encounter (Signed)
Patient had left a message stating on Friday she started having a sharp pain in the spleen area causing nausea and is having bilateral leg pain.The patient went to ER and had four tubes of blood taking and was told that she wouldn't see a doctor until after sunrise Saturday morning so the patient left before receiving care.She stated that she called twice on Saturday morning to see if she could get her inr results but never received an answer.Patient stated she did not want to go back to the ER and wanted to see if our office would be able to see the results and to see if we can help with pain.I spoke with Eulogio Ditch and she advise that the blood work would be good for 6-24hrs after being drawn and that she will likely will have to be redrawn cause she was not tested at the time and base on what we can see the in the system the INR was not check.I also advise the patient she may have to pay out pocket to have INR check at the lab and that she will have get her INR check on a regular basis.Patient has been made aware and verbalize understanding.

## 2018-12-21 ENCOUNTER — Inpatient Hospital Stay
Admission: EM | Admit: 2018-12-21 | Discharge: 2018-12-23 | DRG: 699 | Disposition: A | Discharge status: Home / Self Care | Payer: Medicaid Other | Attending: Internal Medicine | Admitting: Internal Medicine

## 2018-12-21 ENCOUNTER — Emergency Department: Payer: Medicaid Other

## 2018-12-21 ENCOUNTER — Other Ambulatory Visit: Payer: Self-pay

## 2018-12-21 DIAGNOSIS — Z716 Tobacco abuse counseling: Secondary | ICD-10-CM

## 2018-12-21 DIAGNOSIS — E876 Hypokalemia: Secondary | ICD-10-CM | POA: Diagnosis present

## 2018-12-21 DIAGNOSIS — Z79899 Other long term (current) drug therapy: Secondary | ICD-10-CM | POA: Diagnosis not present

## 2018-12-21 DIAGNOSIS — Z7901 Long term (current) use of anticoagulants: Secondary | ICD-10-CM | POA: Diagnosis not present

## 2018-12-21 DIAGNOSIS — Z20828 Contact with and (suspected) exposure to other viral communicable diseases: Secondary | ICD-10-CM | POA: Diagnosis present

## 2018-12-21 DIAGNOSIS — Z8249 Family history of ischemic heart disease and other diseases of the circulatory system: Secondary | ICD-10-CM

## 2018-12-21 DIAGNOSIS — R791 Abnormal coagulation profile: Secondary | ICD-10-CM | POA: Diagnosis present

## 2018-12-21 DIAGNOSIS — I743 Embolism and thrombosis of arteries of the lower extremities: Secondary | ICD-10-CM | POA: Diagnosis present

## 2018-12-21 DIAGNOSIS — I82409 Acute embolism and thrombosis of unspecified deep veins of unspecified lower extremity: Secondary | ICD-10-CM

## 2018-12-21 DIAGNOSIS — I7411 Embolism and thrombosis of thoracic aorta: Secondary | ICD-10-CM | POA: Diagnosis present

## 2018-12-21 DIAGNOSIS — N280 Ischemia and infarction of kidney: Secondary | ICD-10-CM | POA: Diagnosis not present

## 2018-12-21 DIAGNOSIS — F17210 Nicotine dependence, cigarettes, uncomplicated: Secondary | ICD-10-CM | POA: Diagnosis present

## 2018-12-21 DIAGNOSIS — I749 Embolism and thrombosis of unspecified artery: Secondary | ICD-10-CM | POA: Diagnosis present

## 2018-12-21 DIAGNOSIS — I7410 Embolism and thrombosis of unspecified parts of aorta: Secondary | ICD-10-CM | POA: Diagnosis present

## 2018-12-21 DIAGNOSIS — M545 Low back pain: Secondary | ICD-10-CM | POA: Diagnosis not present

## 2018-12-21 LAB — BASIC METABOLIC PANEL
Anion gap: 11 (ref 5–15)
BUN: <5 mg/dL — ABNORMAL LOW (ref 6–20)
CO2: 22 mmol/L (ref 22–32)
Calcium: 8.6 mg/dL — ABNORMAL LOW (ref 8.9–10.3)
Chloride: 102 mmol/L (ref 98–111)
Creatinine, Ser: 0.53 mg/dL (ref 0.44–1.00)
GFR calc Af Amer: >60 mL/min (ref >60)
GFR calc non Af Amer: >60 mL/min (ref >60)
Glucose, Bld: 112 mg/dL — ABNORMAL HIGH (ref 70–99)
Potassium: 3.3 mmol/L — ABNORMAL LOW (ref 3.5–5.1)
Sodium: 135 mmol/L (ref 135–145)

## 2018-12-21 LAB — CBC
HCT: 42.2 % (ref 36.0–46.0)
Hemoglobin: 13.9 g/dL (ref 12.0–15.0)
MCH: 29.8 pg (ref 26.0–34.0)
MCHC: 32.9 g/dL (ref 30.0–36.0)
MCV: 90.6 fL (ref 80.0–100.0)
Platelets: 500 10*3/uL — ABNORMAL HIGH (ref 150–400)
RBC: 4.66 MIL/uL (ref 3.87–5.11)
RDW: 14.5 % (ref 11.5–15.5)
WBC: 10.4 10*3/uL (ref 4.0–10.5)
nRBC: 0 % (ref 0.0–0.2)

## 2018-12-21 LAB — POCT PREGNANCY, URINE: Preg Test, Ur: NEGATIVE

## 2018-12-21 LAB — PROTIME-INR
INR: 1 (ref 0.8–1.2)
Prothrombin Time: 13.5 seconds (ref 11.4–15.2)

## 2018-12-21 LAB — SARS CORONAVIRUS 2 BY RT PCR (HOSPITAL ORDER, PERFORMED IN ~~LOC~~ HOSPITAL LAB): SARS Coronavirus 2: NEGATIVE

## 2018-12-21 LAB — APTT: aPTT: 30 seconds (ref 24–36)

## 2018-12-21 MED ORDER — IOHEXOL 350 MG/ML SOLN
100.0000 mL | Freq: Once | INTRAVENOUS | Status: AC | PRN
  Administered 2018-12-21: 100 mL via INTRAVENOUS

## 2018-12-21 MED ORDER — NICOTINE 14 MG/24HR TD PT24
14.0000 mg | MEDICATED_PATCH | Freq: Every day | TRANSDERMAL | Status: DC
  Administered 2018-12-22 – 2018-12-23 (×2): 14 mg via TRANSDERMAL
  Filled 2018-12-21 (×2): qty 1

## 2018-12-21 MED ORDER — WARFARIN SODIUM 1 MG PO TABS: 1.0000 mg | ORAL_TABLET | Freq: Once | ORAL | Status: DC

## 2018-12-21 MED ORDER — ALBUTEROL SULFATE (2.5 MG/3ML) 0.083% IN NEBU: 2.5000 mg | INHALATION_SOLUTION | Freq: Four times a day (QID) | RESPIRATORY_TRACT | Status: DC | PRN

## 2018-12-21 MED ORDER — WARFARIN SODIUM 3 MG PO TABS
3.0000 mg | ORAL_TABLET | Freq: Once | ORAL | Status: AC
  Administered 2018-12-21: 3 mg via ORAL
  Filled 2018-12-21: qty 1

## 2018-12-21 MED ORDER — POTASSIUM CHLORIDE CRYS ER 20 MEQ PO TBCR
20.0000 meq | EXTENDED_RELEASE_TABLET | Freq: Two times a day (BID) | ORAL | Status: AC
  Administered 2018-12-21 – 2018-12-22 (×2): 20 meq via ORAL
  Filled 2018-12-21 (×2): qty 1

## 2018-12-21 MED ORDER — DOCUSATE SODIUM 100 MG PO CAPS: 100.0000 mg | ORAL_CAPSULE | Freq: Two times a day (BID) | ORAL | Status: DC | PRN

## 2018-12-21 MED ORDER — HEPARIN (PORCINE) 25000 UT/250ML-% IV SOLN
INTRAVENOUS | Status: AC
  Filled 2018-12-21: qty 250

## 2018-12-21 MED ORDER — OXYCODONE HCL 5 MG PO TABS
5.0000 mg | ORAL_TABLET | Freq: Three times a day (TID) | ORAL | Status: DC | PRN
  Administered 2018-12-21 – 2018-12-23 (×4): 5 mg via ORAL
  Filled 2018-12-21 (×4): qty 1

## 2018-12-21 MED ORDER — HEPARIN BOLUS VIA INFUSION
4700.0000 [IU] | Freq: Once | INTRAVENOUS | Status: AC
  Administered 2018-12-21: 18:00:00 4700 [IU] via INTRAVENOUS
  Filled 2018-12-21: qty 4700

## 2018-12-21 MED ORDER — HEPARIN (PORCINE) 25000 UT/250ML-% IV SOLN: 10.0000 [IU]/kg/h | INTRAVENOUS | Status: DC

## 2018-12-21 MED ORDER — HEPARIN SODIUM (PORCINE) 5000 UNIT/ML IJ SOLN: 4000.0000 [IU] | Freq: Once | INTRAMUSCULAR | Status: DC

## 2018-12-21 MED ORDER — HEPARIN (PORCINE) 25000 UT/250ML-% IV SOLN
1450.0000 [IU]/h | INTRAVENOUS | Status: DC
  Administered 2018-12-21 – 2018-12-22 (×3): 1200 [IU]/h via INTRAVENOUS
  Administered 2018-12-23: 1350 [IU]/h via INTRAVENOUS
  Filled 2018-12-21 (×2): qty 250

## 2018-12-21 NOTE — ED Provider Notes (Signed)
[facility] Emergency Department Provider Note   ____________________________________________   First MD Initiated Contact with Patient 12/21/18 1730     (approximate)  I have reviewed the triage vital signs and the nursing notes.   HISTORY  Chief Complaint Abdominal Pain   HPI Suzanne [name] is a 43 y.o. 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         Past Medical History:  Diagnosis Date  . 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 Hyperlipidemia 12/18/2017  . Chest pain 01/22/2016  . Tobacco dependence 01/22/2016  . 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 CESAREAN SECTION     times 4    Prior to Admission medications   Medication Sig Start Date End Date Taking? Authorizing Provider  albuterol (PROVENTIL HFA;VENTOLIN HFA) 108 (90 Base) MCG/ACT inhaler Inhale 2 puffs into the lungs every 6 (six) hours as needed for wheezing or shortness of breath. 11/21/16   Merlyn Lot, MD  nicotine (NICODERM CQ) 14 mg/24hr patch Place 1 patch (14 mg total) onto the skin daily. Patient not taking: Reported on 12/21/2018 11/21/16   Merlyn Lot, MD  oxyCODONE (OXY IR/ROXICODONE) 5 MG immediate release tablet Take 1 tablet (5 mg total) by mouth every 8 (eight) hours as needed for severe pain. Patient not taking: Reported on 02/15/2016 10/05/15   Cammie Sickle, MD  warfarin (COUMADIN) 1 MG tablet TAKE 3 TABLETS BY MOUTH DAILY 05/05/18   Algernon Huxley, MD  warfarin (COUMADIN) 1 MG tablet  Take 1 tablet (1 mg total) by mouth daily. 08/12/18   Kris Hartmann, NP  warfarin (COUMADIN) 1 MG tablet TAKE 1 TABLET BY MOUTH EVERY DAY 12/10/18   Algernon Huxley, MD  warfarin (COUMADIN) 5 MG tablet TAKE 1 TABLET(5 MG) BY MOUTH DAILY 03/17/17   Dew, Erskine Squibb, MD    Allergies Patient has no known allergies.  Family History  Adopted: Yes    Social History Social History   Tobacco Use  . Smoking status: Current Some Day Smoker    Packs/day: 0.25    Years: 20.00    Pack years: 5.00    Types: Cigarettes  . Smokeless tobacco: Never Used  Substance Use Topics  . Alcohol use: Yes    Comment: occ  . Drug use: No    Review of Systems Currently: Constitutional: No fever/chills Eyes: No visual changes. ENT: No sore throat. Cardiovascular: Denies chest pain. Respiratory: Denies shortness of breath. Gastrointestinal: No abdominal pain.  No nausea, no vomiting.  No diarrhea.  No constipation. Genitourinary: Negative for dysuria. Musculoskeletal: Negative for back pain. Skin: Negative for rash. Neurological: Negative for headaches, focal weakness  ____________________________________________   PHYSICAL EXAM:  VITAL SIGNS: ED Triage Vitals  Enc Vitals Group     BP 12/21/18 1444 (!) 188/88     Pulse Rate 12/21/18 1442 88     Resp 12/21/18 1442 18     Temp 12/21/18 1444 98.7 F (  37.1 C)     Temp Source 12/21/18 1442 Oral     SpO2 12/21/18 1442 98 %     Weight 12/21/18 1443 150 lb (68 kg)     Height 12/21/18 1443 5\' 7"  (1.702 m)     Head Circumference --      Peak Flow --      Pain Score 12/21/18 1443 6     Pain Loc --      Pain Edu? --      Excl. in [place]? --     Constitutional: Alert and oriented. Well appearing and in no acute distress. Eyes: Conjunctivae are normal.  Head: Atraumatic. Nose: No congestion/rhinnorhea. Mouth/Throat: Mucous membranes are moist.  Oropharynx non-erythematous. Neck: No stridor.  Cardiovascular: Normal rate, regular rhythm. Grossly normal  heart sounds.  Good peripheral circulation.  This includes pulses in the wrists and ankles Respiratory: Normal respiratory effort.  No retractions. Lungs CTAB. Gastrointestinal: Soft and nontender. No distention. No abdominal bruits. No CVA tenderness. Musculoskeletal: No lower extremity tenderness nor edema.  . Neurologic:  Normal speech and language. No gross focal neurologic deficits are appreciated.  Skin:  Skin is warm, dry and intact. No rash noted.   ____________________________________________   LABS (all labs ordered are listed, but only abnormal results are displayed)  Labs Reviewed  CBC - Abnormal; Notable for the following components:      Result Value   Platelets 500 (*)    All other components within normal limits  BASIC METABOLIC PANEL - Abnormal; Notable for the following components:   Potassium 3.3 (*)    Glucose, Bld 112 (*)    BUN <5 (*)    Calcium 8.6 (*)    All other components within normal limits  SARS CORONAVIRUS 2 (HOSPITAL ORDER, [place] [facility])  APTT  PROTIME-INR  HEPARIN LEVEL (UNFRACTIONATED)  CBC  POCT PREGNANCY, URINE   ____________________________________________  EKG   ____________________________________________  RADIOLOGY  ED MD interpretation: CT angios read by radiology and reviewed by me shows multiple clots  Official radiology report(s): Ct Angio Chest/abd/pel For Dissection W And/or Wo Contrast  Result Date: 12/21/2018 CLINICAL DATA:  Chest pain and severe left upper quadrant pain with nausea and vomiting. Severe bilateral lower extremity pain. Tingling and numbness. History of aortic thrombus in September 2016. EXAM: CT ANGIOGRAPHY CHEST, ABDOMEN AND PELVIS TECHNIQUE: Multidetector CT imaging through the chest, abdomen and pelvis was performed using the standard protocol during bolus administration of intravenous contrast. Multiplanar reconstructed images and MIPs were obtained and reviewed to evaluate the  vascular anatomy. CONTRAST:  166mL OMNIPAQUE IOHEXOL 350 MG/ML SOLN COMPARISON:  CT angiograms dated 11/21/2016, 07/19/2015 and 01/18/2015 and 01/16/2015 FINDINGS: CTA CHEST FINDINGS Cardiovascular: The patient has developed recurrent thrombus in the anterior aspect of the distal thoracic aorta. Heart appears normal. No pericardial effusion. No appreciable pulmonary emboli. Mediastinum/Nodes: No enlarged mediastinal, hilar, or axillary lymph nodes. Thyroid gland, trachea, and esophagus demonstrate no significant findings. 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 The lungs are otherwise clear. No effusions. Musculoskeletal: No chest wall abnormality. No acute or significant osseous findings. Review of the MIP images confirms the above findings. CTA ABDOMEN AND PELVIS FINDINGS VASCULAR Aorta: There is new thrombus in the anterior aspect of the abdominal aorta just below the level of the renal arteries there is calcification and aorta just above the aortic bifurcation. Celiac: Patent without evidence of aneurysm, dissection, vasculitis or significant  stenosis. SMA: Patent without evidence of aneurysm, dissection, vasculitis or significant stenosis. Renals: Single renal arteries bilaterally. Multiple patchy areas of parenchymal infarction in the left kidney. IMA: Patent without evidence of aneurysm, dissection, vasculitis or significant stenosis. Inflow: Thrombus in the descending thoracic aorta as described above. Veins: There is haziness surrounding the left common femoral vein in the left groin which suspect represents venous thrombosis. Arteries of the proximal thighs: There is a nonocclusive thrombus in the proximal right profunda femoris artery. 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 These are new since the prior exams. Review of the MIP images confirms the above findings. NON-VASCULAR  Hepatobiliary: No focal liver abnormality is seen. No gallstones, gallbladder wall thickening, or biliary dilatation. Pancreas: Unremarkable. No pancreatic ductal dilatation or surrounding inflammatory changes. Spleen: The spleen is irregular and slightly smaller than on prior studies consistent with the history of prior splenic infarcts. No discrete acute splenic infarct. Adrenals/Urinary Tract: Multiple new areas of low-density parenchyma of left kidney consistent with acute renal infarcts. Right kidney appears normal. Adrenal glands are normal. No hydronephrosis. Bladder is normal. Stomach/Bowel: Stomach is within normal limits. Appendix appears normal. No evidence of bowel wall thickening, distention, or inflammatory changes. Lymphatic: No adenopathy. Reproductive: 17 mm partially collapsed cyst on the left ovary. Right ovary and uterus appear normal. Other: No abdominal wall hernia or abnormality. No abdominopelvic ascites. Musculoskeletal: No acute abnormality. Moderate arthritic changes of the right acetabulum. Slight arthritic changes left acetabulum. Review of the MIP images confirms the above findings. IMPRESSION: 1. 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 2. Multiple new small renal infarctions. 3. 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 4. Nonocclusive thrombus in the proximal right profunda femoris artery. 5. Probable thrombus of the left common femoral vein extending distally into the left thigh. Critical Value/emergent results were called by telephone at the time of interpretation on 12/21/2018 at 5:23 pm to Dr. Duffy Bruce , who verbally acknowledged these results. Electronically Signed   By: Lorriane Shire M.D.   On: 12/21/2018 17:43    ____________________________________________   PROCEDURES  Procedure(s) performed (including Critical Care): Critical care time 35 minutes this includes reviewing the CT scan and speaking  with the patient and Dr. do and also reviewing her old records and discussing the patient with the hospitalist.  Procedures   ____________________________________________   INITIAL IMPRESSION / ASSESSMENT AND PLAN / ED COURSE  Suzanne [name] was evaluated in Emergency Department on 12/21/2018 for the symptoms described in the history of present illness. She was evaluated in the context of the global COVID-19 pandemic, which necessitated consideration that the patient might be at risk for infection with the SARS-CoV-2 virus that causes COVID-19. Institutional protocols and algorithms that pertain to the evaluation of patients at risk for COVID-19 are in a state of rapid change based on information released by regulatory bodies including the CDC and federal and state organizations. These policies and algorithms were followed during the patient's care in the ED.     Patient with multiple clots and infarcts which are new.  Patient is not therapeutic on Coumadin.  This may be because she ate some broccoli earlier in the week.  She maintains she is taking her recommended dose of Coumadin.  Dr. do feels we should get her in the hospital and anticoagulate her.  I agree with this completely.  He will decide on further treatment afterwards.  Patient says she will come in the hospital but wants to get out of the hospital quickly because her birthday is coming up and she does not want to spend in the hospital.         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    Nena Polio, MD 12/21/18 [phone]

## 2018-12-21 NOTE — ED Notes (Signed)
After pt spoke with Dr Cinda Quest and Dr Lucky Cowboy she agreed to have IV started and be connected to monitors

## 2018-12-21 NOTE — ED Notes (Signed)
ED TO INPATIENT HANDOFF REPORT  ED Nurse Name and Phone #: Bascom Levels Name/Age/Gender [name] 43 y.o. female Room/Bed: ED10A/ED10A  Code Status   Code Status: Prior  Home/SNF/Other Home Patient oriented to: self, place, time and situation Is this baseline? Yes   Triage Complete: Triage complete  Chief Complaint abd pain;leg pain  Triage Note Pt c/o severe sudden LUQ pain with N/V Friday night and the pain subsided after vomiting but now having severe BL LE pain, like tingling numb pain. 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  Pt is concerned about another clot. Pedal pulses are strong no discoloration noted in the lower extremties   Allergies No Known Allergies  Level of Care/Admitting Diagnosis ED Disposition    ED Disposition Condition [facility] Area: [place] [[id number]]  Level of Care: Med-Surg [16]  Covid Evaluation: Asymptomatic Screening Protocol (No Symptoms)  Diagnosis: Arterial thrombosis [facility]) [id number]  Admitting Physician: Vaughan Basta [id number]  Attending Physician: Vaughan Basta [phone]  Estimated length of stay: past midnight tomorrow  Certification:: I certify this patient will need inpatient services for at least 2 midnights  PT Class (Do Not Modify): Inpatient [101]  PT Acc Code (Do Not Modify): Private [1]       B Medical/Surgery History Past Medical History:  Diagnosis Date  . 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 CESAREAN SECTION     times 4     A IV Location/Drains/Wounds Patient Lines/Drains/Airways Status   Active Line/Drains/Airways    Name:   Placement date:   Placement time:   Site:   Days:   Peripheral IV 12/21/18 Right Antecubital   12/21/18    1459    Antecubital   less than 1   Peripheral IV 12/21/18 Left Hand   12/21/18    1832    Hand   less than  1          Intake/Output Last 24 hours No intake or output data in the 24 hours ending 12/21/18 1921  Labs/Imaging Results for orders placed or performed during the hospital encounter of 12/21/18 (from the past 48 hour(s))  APTT     Status: None   Collection Time: 12/21/18  3:01 PM  Result Value Ref Range   aPTT 30 24 - 36 seconds    Comment: Performed at [facility], [address] [place], [place] [address]  Protime-INR     Status: None   Collection Time: 12/21/18  3:01 PM  Result Value Ref Range   Prothrombin Time 13.5 11.4 - 15.2 seconds   INR 1.0 0.8 - 1.2    Comment: (NOTE) INR goal varies based on device and disease states. Performed at [facility], [address] [place], [place] [address]   CBC     Status: Abnormal   Collection Time: 12/21/18  3:01 PM  Result Value Ref Range   WBC 10.4 4.0 - 10.5 K/uL   RBC 4.66 3.87 - 5.11 MIL/uL   Hemoglobin 13.9 12.0 - 15.0 g/dL   HCT 42.2 36.0 - 46.0 %   MCV 90.6 80.0 - 100.0 fL   MCH 29.8 26.0 - 34.0 pg   MCHC 32.9 30.0 - 36.0 g/dL   RDW 14.5 11.5 - 15.5 %   Platelets 500 (H) 150 - 400 K/uL  nRBC 0.0 0.0 - 0.2 %    Comment: Performed at [facility], [address] [place], [place] [id number]  Basic metabolic panel     Status: Abnormal   Collection Time: 12/21/18  3:01 PM  Result Value Ref Range   Sodium 135 135 - 145 mmol/L   Potassium 3.3 (L) 3.5 - 5.1 mmol/L   Chloride 102 98 - 111 mmol/L   CO2 22 22 - 32 mmol/L   Glucose, Bld 112 (H) 70 - 99 mg/dL   BUN <5 (L) 6 - 20 mg/dL   Creatinine, Ser 0.53 0.44 - 1.00 mg/dL   Calcium 8.6 (L) 8.9 - 10.3 mg/dL   GFR calc non Af Amer >60 >60 mL/min   GFR calc Af Amer >60 >60 mL/min   Anion gap 11 5 - 15    Comment: Performed at [facility], [address] [place], [place] [address]  Pregnancy, urine POC     Status: None   Collection Time: 12/21/18  4:42 PM  Result Value Ref Range   Preg Test, Ur NEGATIVE NEGATIVE    Comment:         THE SENSITIVITY OF THIS METHODOLOGY IS >24 mIU/mL    Ct Angio Chest/abd/pel For Dissection W And/or Wo Contrast  Result Date: 12/21/2018 CLINICAL DATA:  Chest pain and severe left upper quadrant pain with nausea and vomiting. Severe bilateral lower extremity pain. Tingling and numbness. History of aortic thrombus in September 2016. EXAM: CT ANGIOGRAPHY CHEST, ABDOMEN AND PELVIS TECHNIQUE: Multidetector CT imaging through the chest, abdomen and pelvis was performed using the standard protocol during bolus administration of intravenous contrast. Multiplanar reconstructed images and MIPs were obtained and reviewed to evaluate the vascular anatomy. CONTRAST:  152mL OMNIPAQUE IOHEXOL 350 MG/ML SOLN COMPARISON:  CT angiograms dated 11/21/2016, 07/19/2015 and 01/18/2015 and 01/16/2015 FINDINGS: CTA CHEST FINDINGS Cardiovascular: The patient has developed recurrent thrombus in the anterior aspect of the distal thoracic aorta. Heart appears normal. No pericardial effusion. No appreciable pulmonary emboli. Mediastinum/Nodes: No enlarged mediastinal, hilar, or axillary lymph nodes. Thyroid gland, trachea, and esophagus demonstrate no significant findings. 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 The lungs are otherwise clear. No effusions. Musculoskeletal: No chest wall abnormality. No acute or significant osseous findings. Review of the MIP images confirms the above findings. CTA ABDOMEN AND PELVIS FINDINGS VASCULAR Aorta: There is new thrombus in the anterior aspect of the abdominal aorta just below the level of the renal arteries there is calcification and aorta just above the aortic bifurcation. Celiac: Patent without evidence of aneurysm, dissection, vasculitis or significant stenosis. SMA: Patent without evidence of aneurysm, dissection, vasculitis or significant stenosis. Renals: Single renal arteries bilaterally. Multiple patchy  areas of parenchymal infarction in the left kidney. IMA: Patent without evidence of aneurysm, dissection, vasculitis or significant stenosis. Inflow: Thrombus in the descending thoracic aorta as described above. Veins: There is haziness surrounding the left common femoral vein in the left groin which suspect represents venous thrombosis. Arteries of the proximal thighs: There is a nonocclusive thrombus in the proximal right profunda femoris artery. 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 These are new since the prior exams. Review of the MIP images confirms the above findings. NON-VASCULAR Hepatobiliary: No focal liver abnormality is seen. No gallstones, gallbladder wall thickening, or biliary dilatation. Pancreas: Unremarkable. No pancreatic ductal dilatation or surrounding inflammatory changes. Spleen: The spleen is irregular and  slightly smaller than on prior studies consistent with the history of prior splenic infarcts. No discrete acute splenic infarct. Adrenals/Urinary Tract: Multiple new areas of low-density parenchyma of left kidney consistent with acute renal infarcts. Right kidney appears normal. Adrenal glands are normal. No hydronephrosis. Bladder is normal. Stomach/Bowel: Stomach is within normal limits. Appendix appears normal. No evidence of bowel wall thickening, distention, or inflammatory changes. Lymphatic: No adenopathy. Reproductive: 17 mm partially collapsed cyst on the left ovary. Right ovary and uterus appear normal. Other: No abdominal wall hernia or abnormality. No abdominopelvic ascites. Musculoskeletal: No acute abnormality. Moderate arthritic changes of the right acetabulum. Slight arthritic changes left acetabulum. Review of the MIP images confirms the above findings. IMPRESSION: 1. 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 2. Multiple new small renal infarctions. 3. 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 4. Nonocclusive thrombus in the proximal right profunda femoris artery. 5. Probable thrombus of the left common femoral vein extending distally into the left thigh. Critical Value/emergent results were called by telephone at the time of interpretation on 12/21/2018 at 5:23 pm to Dr. Duffy Bruce , who verbally acknowledged these results. Electronically Signed   By: Lorriane Shire M.D.   On: 12/21/2018 17:43    Pending Labs Unresulted Labs (From admission, onward)    Start     Ordered   12/22/18 0100  Heparin level (unfractionated)  Once-Timed,   STAT     12/21/18 1835   12/22/18 0100  CBC  Once-Timed,   STAT     12/21/18 1836   12/21/18 1807  SARS Coronavirus 2 [facility] order, Performed in [facility] hospital lab) Nasopharyngeal Nasopharyngeal Swab  (Symptomatic/High Risk of Exposure/Tier 1 Patients Labs with Precautions)  ONCE - STAT,   STAT    Question Answer Comment  Is this test for diagnosis or screening Diagnosis of ill patient   Symptomatic for COVID-19 as defined by CDC Yes   Date of Symptom Onset 12/21/2018   Hospitalized for COVID-19 Yes   Admitted to ICU for COVID-19 No   Previously tested for COVID-19 No   Resident in a congregate (group) care setting No   Employed in healthcare setting No   Pregnant No      12/21/18 1807   Signed and Held  HIV antibody (Routine Testing)  Once,   R     Signed and Held   Signed and Held  Basic metabolic panel  Tomorrow morning,   R     Signed and Held   Signed and Held  CBC  Tomorrow morning,   R     Signed and Held          Vitals/Pain Today's Vitals   12/21/18 1443 12/21/18 1444 12/21/18 1744 12/21/18 1831  BP:  (!) 188/88 (!) 178/102 100/80  Pulse:   60 70  Resp:    20  Temp:  98.7 F (37.1 C)    TempSrc:  Oral    SpO2:   99% 99%  Weight: 68 kg     Height: 5\' 7"  (1.702 m)     PainSc: 6        Isolation Precautions Airborne and Contact precautions  Medications Medications  heparin 25000-0.45  UT/250ML-% infusion (has no administration in time range)  heparin ADULT infusion 100 units/mL (25000 units/270mL sodium chloride 0.45%) (1,200 Units/hr Intravenous Rate/Dose Verify 12/21/18 1917)  iohexol (OMNIPAQUE) 350 MG/ML injection 100 mL (100 mLs Intravenous Contrast Given 12/21/18  1655)  warfarin (COUMADIN) tablet 3 mg (3 mg Oral Given 12/21/18 1829)  heparin bolus via infusion 4,700 Units (4,700 Units Intravenous Bolus from Bag 12/21/18 1827)    Mobility walks with device Low fall risk   Focused Assessments Cardiac Assessment Handoff:    Lab Results  Component Value Date   TROPONINI <0.03 11/21/2016   No results found for: DDIMER Does the Patient currently have chest pain? No     R Recommendations: See Admitting Provider Note  Report given to:   Additional Notes:

## 2018-12-21 NOTE — ED Notes (Signed)
Patient informed of bed assignment

## 2018-12-21 NOTE — ED Notes (Signed)
Pt increasingly agitated that she is having to stay in the hospital - she states that she was here Friday and "turned away" - she states that had they kept here Friday this would not be happening - she is unhappy with care at this hospital and upset with admission process and having to wait in lobby

## 2018-12-21 NOTE — ED Notes (Signed)
Pt demanded that her IV in left AC be restarted in her hand

## 2018-12-21 NOTE — Progress Notes (Signed)
ANTICOAGULATION CONSULT NOTE - Initial Consult  Pharmacy Consult for Heparin  Indication: aortic clot  No Known Allergies  Patient Measurements: Height: 5\' 7"  (170.2 cm) Weight: 150 lb (68 kg) IBW/kg (Calculated) : 61.6 Heparin Dosing Weight:  68 kg   Vital Signs: Temp: 98.7 F (37.1 C) (09/01 1444) Temp Source: Oral (09/01 1444) BP: 188/88 (09/01 1444) Pulse Rate: 88 (09/01 1442)  Labs: Recent Labs    12/21/18 1501  HGB 13.9  HCT 42.2  PLT 500*  APTT 30  LABPROT 13.5  INR 1.0  CREATININE 0.53    Estimated Creatinine Clearance: 90 mL/min (by C-G formula based on SCr of 0.53 mg/dL).   Medical History: Past Medical History:  Diagnosis Date  . 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  Pt was on warfarin PTA ;  INR on 9/1 = 1.0.   CrCl = 90 ml/min  Goal of Therapy:  Heparin level 0.3-0.7 units/ml Monitor platelets by anticoagulation protocol: Yes   Plan:  Give 4700 units bolus x 1 Start heparin infusion at 1200 units/hr Check anti-Xa level in 6 hours and daily while on heparin Continue to monitor H&H and platelets  Rickelle Sylvestre D 12/21/2018,6:26 PM

## 2018-12-21 NOTE — ED Triage Notes (Signed)
Pt c/o severe sudden LUQ pain with N/V Friday night and the pain subsided after vomiting but now having severe BL LE pain, like tingling numb pain. 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  Pt is concerned about another clot. Pedal pulses are strong no discoloration noted in the lower extremties

## 2018-12-21 NOTE — ED Notes (Signed)
Kate RN, aware of bed assigned  

## 2018-12-21 NOTE — H&P (Signed)
[facility] NAME: Suzanne [name]    MR#:  [id number]  DATE OF BIRTH:  09-01-1975  DATE OF ADMISSION:  12/21/2018  PRIMARY CARE PHYSICIAN: System, Pcp Not In   REQUESTING/REFERRING PHYSICIAN: malinda  CHIEF COMPLAINT:   Chief Complaint  Patient presents with  . Abdominal Pain    HISTORY OF PRESENT ILLNESS: Suzanne [name]  is a 43 y.o. female with a known history of aortic thrombus and splenic infarct and was advised to take warfarin every day.  Patient is still taking warfarin every day but because she is going through divorce and financial issues she cannot afford going to physician's office to check her INR regularly so for last 9 months she had not went to check her INR.  She also does not follow dietary advice or restrictions with warfarin. She started having lower back pain last Friday and came to emergency room but she was discharged home by triage nurse as per her. She continued to have lower back pain and claudication with pain in both legs and came to emergency room today again. CT scan angiogram was done which shows extensive progression of her thrombus in aorta with some kidney infarcts and thrombus extending into femoral arteries and veins in lower extremities ER physician spoke to vascular surgeon Dr. Maryella Shivers suggested to start on heparin IV drip.Marland Kitchen  PAST MEDICAL HISTORY:   Past Medical History:  Diagnosis Date  . Aortic thrombus ([place])   . 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 CESAREAN SECTION     times 4    SOCIAL HISTORY:  Social History   Tobacco Use  . Smoking status: Current Some Day Smoker    Packs/day: 0.25    Years: 20.00    Pack years: 5.00    Types: Cigarettes  . Smokeless tobacco: Never Used  Substance Use Topics  . Alcohol use: Yes    Comment: occ    FAMILY HISTORY:  Family History  Adopted: Yes  Problem Relation Age of Onset   . Hypertension Mother     DRUG ALLERGIES: No Known Allergies  REVIEW OF SYSTEMS:   CONSTITUTIONAL: No fever, fatigue or weakness.  EYES: No blurred or double vision.  EARS, NOSE, AND THROAT: No tinnitus or ear pain.  RESPIRATORY: No cough, shortness of breath, wheezing or hemoptysis.  CARDIOVASCULAR: No chest pain, orthopnea, edema.  GASTROINTESTINAL: No nausea, vomiting, diarrhea or abdominal pain.  GENITOURINARY: No dysuria, hematuria.  ENDOCRINE: No polyuria, nocturia,  HEMATOLOGY: No anemia, easy bruising or bleeding SKIN: No rash or lesion. MUSCULOSKELETAL: No joint pain or arthritis.   NEUROLOGIC: No tingling, numbness, weakness.  PSYCHIATRY: No anxiety or depression.   MEDICATIONS AT HOME:  Prior to Admission medications   Medication Sig Start Date End Date Taking? Authorizing Provider  albuterol (PROVENTIL HFA;VENTOLIN HFA) 108 (90 Base) MCG/ACT inhaler Inhale 2 puffs into the lungs every 6 (six) hours as needed for wheezing or shortness of breath. 11/21/16   Merlyn Lot, MD  nicotine (NICODERM CQ) 14 mg/24hr patch Place 1 patch (14 mg total) onto the skin daily. Patient not taking: Reported on 12/21/2018 11/21/16   Merlyn Lot, MD  oxyCODONE (OXY IR/ROXICODONE) 5 MG immediate release tablet Take 1 tablet (5 mg total) by mouth every 8 (eight) hours as needed for severe pain. Patient not taking: Reported on 02/15/2016 10/05/15   Cammie Sickle, MD  warfarin (COUMADIN) 1  MG tablet TAKE 3 TABLETS BY MOUTH DAILY 05/05/18   Algernon Huxley, MD  warfarin (COUMADIN) 1 MG tablet Take 1 tablet (1 mg total) by mouth daily. 08/12/18   Kris Hartmann, NP  warfarin (COUMADIN) 1 MG tablet TAKE 1 TABLET BY MOUTH EVERY DAY 12/10/18   Algernon Huxley, MD  warfarin (COUMADIN) 5 MG tablet TAKE 1 TABLET(5 MG) BY MOUTH DAILY 03/17/17   Algernon Huxley, MD      PHYSICAL EXAMINATION:   VITAL SIGNS: Blood pressure 100/80, pulse 70, temperature 98.7 F (37.1 C), temperature source Oral,  resp. rate 20, height 5\' 7"  (1.702 m), weight 68 kg, last menstrual period 11/26/2018, SpO2 99 %.  GENERAL:  43 y.o.-year-old patient lying in the bed with no acute distress.  EYES: Pupils equal, round, reactive to light and accommodation. No scleral icterus. Extraocular muscles intact.  HEENT: Head atraumatic, normocephalic. Oropharynx and nasopharynx clear.  NECK:  Supple, no jugular venous distention. No thyroid enlargement, no tenderness.  LUNGS: Normal breath sounds bilaterally, no wheezing, rales,rhonchi or crepitation. No use of accessory muscles of respiration.  CARDIOVASCULAR: S1, S2 normal. No murmurs, rubs, or gallops.  ABDOMEN: Soft, nontender, nondistended. Bowel sounds present. No organomegaly or mass.  EXTREMITIES: No pedal edema, cyanosis, or clubbing.  NEUROLOGIC: Cranial nerves II through XII are intact. Muscle strength 5/5 in all extremities. Sensation intact. Gait not checked.  PSYCHIATRIC: The patient is alert and oriented x 3.  SKIN: No obvious rash, lesion, or ulcer.   LABORATORY PANEL:   CBC Recent Labs  Lab 12/17/18 2308 12/21/18 1501  WBC 16.1* 10.4  HGB 14.3 13.9  HCT 41.9 42.2  PLT 408* 500*  MCV 89.5 90.6  MCH 30.6 29.8  MCHC 34.1 32.9  RDW 14.3 14.5  LYMPHSABS 3.7  --   MONOABS 1.2*  --   EOSABS 0.1  --   BASOSABS 0.1  --    ------------------------------------------------------------------------------------------------------------------  Chemistries  Recent Labs  Lab 12/17/18 2308 12/21/18 1501  NA 134* 135  K 3.5 3.3*  CL 102 102  CO2 21* 22  GLUCOSE 108* 112*  BUN <5* <5*  CREATININE 0.64 0.53  CALCIUM 8.5* 8.6*  AST 33  --   ALT 23  --   ALKPHOS 103  --   BILITOT 0.6  --    ------------------------------------------------------------------------------------------------------------------ estimated creatinine clearance is 90 mL/min (by C-G formula based on SCr of 0.53  mg/dL). ------------------------------------------------------------------------------------------------------------------ No results for input(s): TSH, T4TOTAL, T3FREE, THYROIDAB in the last 72 hours.  Invalid input(s): FREET3   Coagulation profile Recent Labs  Lab 12/21/18 1501  INR 1.0   ------------------------------------------------------------------------------------------------------------------- No results for input(s): DDIMER in the last 72 hours. -------------------------------------------------------------------------------------------------------------------  Cardiac Enzymes No results for input(s): CKMB, TROPONINI, MYOGLOBIN in the last 168 hours.  Invalid input(s): CK ------------------------------------------------------------------------------------------------------------------ Invalid input(s): POCBNP  ---------------------------------------------------------------------------------------------------------------  Urinalysis    Component Value Date/Time   COLORURINE YELLOW (A) 01/15/2015 1934   APPEARANCEUR HAZY (A) 01/15/2015 1934   APPEARANCEUR Cloudy 04/23/2011 1538   LABSPEC 1.004 (L) 01/15/2015 1934   LABSPEC 1.003 04/23/2011 1538   PHURINE 6.0 01/15/2015 1934   GLUCOSEU NEGATIVE 01/15/2015 1934   GLUCOSEU Negative 04/23/2011 1538   HGBUR NEGATIVE 01/15/2015 1934   BILIRUBINUR NEGATIVE 01/15/2015 1934   BILIRUBINUR Negative 04/23/2011 1538   KETONESUR NEGATIVE 01/15/2015 1934   PROTEINUR NEGATIVE 01/15/2015 1934   NITRITE NEGATIVE 01/15/2015 1934   LEUKOCYTESUR 2+ (A) 01/15/2015 1934   LEUKOCYTESUR 3+ 04/23/2011 1538  RADIOLOGY: Ct Angio Chest/abd/pel For Dissection W And/or Wo Contrast  Result Date: 12/21/2018 CLINICAL DATA:  Chest pain and severe left upper quadrant pain with nausea and vomiting. Severe bilateral lower extremity pain. Tingling and numbness. History of aortic thrombus in September 2016. EXAM: CT ANGIOGRAPHY CHEST, ABDOMEN  AND PELVIS TECHNIQUE: Multidetector CT imaging through the chest, abdomen and pelvis was performed using the standard protocol during bolus administration of intravenous contrast. Multiplanar reconstructed images and MIPs were obtained and reviewed to evaluate the vascular anatomy. CONTRAST:  165mL OMNIPAQUE IOHEXOL 350 MG/ML SOLN COMPARISON:  CT angiograms dated 11/21/2016, 07/19/2015 and 01/18/2015 and 01/16/2015 FINDINGS: CTA CHEST FINDINGS Cardiovascular: The patient has developed recurrent thrombus in the anterior aspect of the distal thoracic aorta. Heart appears normal. No pericardial effusion. No appreciable pulmonary emboli. Mediastinum/Nodes: No enlarged mediastinal, hilar, or axillary lymph nodes. Thyroid gland, trachea, and esophagus demonstrate no significant findings. 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 The lungs are otherwise clear. No effusions. Musculoskeletal: No chest wall abnormality. No acute or significant osseous findings. Review of the MIP images confirms the above findings. CTA ABDOMEN AND PELVIS FINDINGS VASCULAR Aorta: There is new thrombus in the anterior aspect of the abdominal aorta just below the level of the renal arteries there is calcification and aorta just above the aortic bifurcation. Celiac: Patent without evidence of aneurysm, dissection, vasculitis or significant stenosis. SMA: Patent without evidence of aneurysm, dissection, vasculitis or significant stenosis. Renals: Single renal arteries bilaterally. Multiple patchy areas of parenchymal infarction in the left kidney. IMA: Patent without evidence of aneurysm, dissection, vasculitis or significant stenosis. Inflow: Thrombus in the descending thoracic aorta as described above. Veins: There is haziness surrounding the left common femoral vein in the left groin which suspect represents venous thrombosis. Arteries of the proximal thighs: There is a  nonocclusive thrombus in the proximal right profunda femoris artery. 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 These are new since the prior exams. Review of the MIP images confirms the above findings. NON-VASCULAR Hepatobiliary: No focal liver abnormality is seen. No gallstones, gallbladder wall thickening, or biliary dilatation. Pancreas: Unremarkable. No pancreatic ductal dilatation or surrounding inflammatory changes. Spleen: The spleen is irregular and slightly smaller than on prior studies consistent with the history of prior splenic infarcts. No discrete acute splenic infarct. Adrenals/Urinary Tract: Multiple new areas of low-density parenchyma of left kidney consistent with acute renal infarcts. Right kidney appears normal. Adrenal glands are normal. No hydronephrosis. Bladder is normal. Stomach/Bowel: Stomach is within normal limits. Appendix appears normal. No evidence of bowel wall thickening, distention, or inflammatory changes. Lymphatic: No adenopathy. Reproductive: 17 mm partially collapsed cyst on the left ovary. Right ovary and uterus appear normal. Other: No abdominal wall hernia or abnormality. No abdominopelvic ascites. Musculoskeletal: No acute abnormality. Moderate arthritic changes of the right acetabulum. Slight arthritic changes left acetabulum. Review of the MIP images confirms the above findings. IMPRESSION: 1. 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 2. Multiple new small renal infarctions. 3. 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 4. Nonocclusive thrombus in the proximal right profunda femoris artery. 5. Probable thrombus of the left common femoral vein extending distally into the left thigh. Critical Value/emergent results were called by telephone at the time of interpretation on 12/21/2018 at 5:23 pm to Dr. Duffy Bruce , who verbally acknowledged these results. Electronically  Signed  By: Lorriane Shire M.D.   On: 12/21/2018 17:43    EKG: Orders placed or performed during the hospital encounter of 11/20/16  . ED EKG  . ED EKG  . EKG    IMPRESSION AND PLAN:  *Aortic thrombus, kidney infarct, deep vein thrombosis Subtherapeutic INR. Started on heparin IV drip. Vascular consult is called in. I will keep n.p.o. after midnight as vascular might need to do some procedures tomorrow.  *Active smoking Counseled to quit smoking for 4 minutes and offered nicotine patch.  *Hypokalemia Replace oral.  All the records are reviewed and case discussed with ED provider. Management plans discussed with the patient, family and they are in agreement.  CODE STATUS: Full code. Code Status History    Date Active Date Inactive Code Status Order ID Comments User Context   01/15/2015 2112 01/18/2015 1652 Full Code [id number]  Hower, Aaron Mose, MD ED   Advance Care Planning Activity       TOTAL TIME TAKING CARE OF THIS PATIENT: 45 minutes.    Vaughan Basta M.D on 12/21/2018   Between 7am to 6pm - Pager - [phone]  After 6pm go to [email or web address] - password [email or web address] [facility]  Office  [phone]  CC: Primary care physician; System, Pcp Not In   Note: This dictation was prepared with Dragon dictation along with smaller phrase technology. Any transcriptional errors that result from this process are unintentional.

## 2018-12-21 NOTE — ED Notes (Signed)
Pt states that "you better juice me up today because my birthday is this weekend and I'm not going to be in here"

## 2018-12-21 NOTE — ED Notes (Signed)
Pt refuses to let nurses place on monitors, start another IV, or provide any care until she talks to Dr Lucky Cowboy - pt states that she knows her rights and that she does not have to let us do anything to her - Dr Cinda Quest is aware and at bedside

## 2018-12-22 DIAGNOSIS — I7411 Embolism and thrombosis of thoracic aorta: Secondary | ICD-10-CM

## 2018-12-22 DIAGNOSIS — I743 Embolism and thrombosis of arteries of the lower extremities: Secondary | ICD-10-CM

## 2018-12-22 DIAGNOSIS — N280 Ischemia and infarction of kidney: Secondary | ICD-10-CM

## 2018-12-22 LAB — PROTIME-INR
INR: 1 (ref 0.8–1.2)
Prothrombin Time: 13.4 seconds (ref 11.4–15.2)

## 2018-12-22 LAB — CBC
HCT: 41 % (ref 36.0–46.0)
HCT: 39.7 % (ref 36.0–46.0)
Hemoglobin: 13.3 g/dL (ref 12.0–15.0)
Hemoglobin: 13 g/dL (ref 12.0–15.0)
MCH: 29.9 pg (ref 26.0–34.0)
MCH: 30.1 pg (ref 26.0–34.0)
MCHC: 32.4 g/dL (ref 30.0–36.0)
MCHC: 32.7 g/dL (ref 30.0–36.0)
MCV: 92.1 fL (ref 80.0–100.0)
MCV: 91.9 fL (ref 80.0–100.0)
Platelets: 522 10*3/uL — ABNORMAL HIGH (ref 150–400)
Platelets: 483 10*3/uL — ABNORMAL HIGH (ref 150–400)
RBC: 4.45 MIL/uL (ref 3.87–5.11)
RBC: 4.32 MIL/uL (ref 3.87–5.11)
RDW: 14.6 % (ref 11.5–15.5)
RDW: 14.4 % (ref 11.5–15.5)
WBC: 11.8 10*3/uL — ABNORMAL HIGH (ref 4.0–10.5)
WBC: 10.2 10*3/uL (ref 4.0–10.5)
nRBC: 0 % (ref 0.0–0.2)
nRBC: 0 % (ref 0.0–0.2)

## 2018-12-22 LAB — BASIC METABOLIC PANEL
Anion gap: 9 (ref 5–15)
BUN: <5 mg/dL — ABNORMAL LOW (ref 6–20)
CO2: 24 mmol/L (ref 22–32)
Calcium: 8.4 mg/dL — ABNORMAL LOW (ref 8.9–10.3)
Chloride: 106 mmol/L (ref 98–111)
Creatinine, Ser: 0.6 mg/dL (ref 0.44–1.00)
GFR calc Af Amer: >60 mL/min (ref >60)
GFR calc non Af Amer: >60 mL/min (ref >60)
Glucose, Bld: 104 mg/dL — ABNORMAL HIGH (ref 70–99)
Potassium: 3.7 mmol/L (ref 3.5–5.1)
Sodium: 139 mmol/L (ref 135–145)

## 2018-12-22 LAB — HEPARIN LEVEL (UNFRACTIONATED)
Heparin Unfractionated: 0.3 IU/mL (ref 0.30–0.70)
Heparin Unfractionated: 0.33 IU/mL (ref 0.30–0.70)
Heparin Unfractionated: 0.22 IU/mL — ABNORMAL LOW (ref 0.30–0.70)
Heparin Unfractionated: 0.32 IU/mL (ref 0.30–0.70)

## 2018-12-22 MED ORDER — HEPARIN BOLUS VIA INFUSION
950.0000 [IU] | Freq: Once | INTRAVENOUS | Status: AC
  Administered 2018-12-22: 18:00:00 950 [IU] via INTRAVENOUS
  Filled 2018-12-22: qty 950

## 2018-12-22 MED ORDER — WARFARIN - PHARMACIST DOSING INPATIENT: Freq: Every day | Status: DC

## 2018-12-22 MED ORDER — WARFARIN SODIUM 7.5 MG PO TABS
7.5000 mg | ORAL_TABLET | Freq: Once | ORAL | Status: DC
  Filled 2018-12-22: qty 1

## 2018-12-22 MED ORDER — WARFARIN SODIUM 4 MG PO TABS
4.0000 mg | ORAL_TABLET | Freq: Once | ORAL | Status: AC
  Administered 2018-12-22: 4 mg via ORAL
  Filled 2018-12-22: qty 1

## 2018-12-22 NOTE — Progress Notes (Signed)
[facility] for Heparin  Indication: aortic clot  No Known Allergies  Patient Measurements: Height: 5\' 7"  (170.2 cm) Weight: 141 lb 9.6 oz (64.2 kg) IBW/kg (Calculated) : 61.6 Heparin Dosing Weight:  68 kg   Vital Signs: Temp: 98.6 F (37 C) (09/01 2256) Temp Source: Oral (09/01 2256) BP: 186/96 (09/01 2256) Pulse Rate: 60 (09/01 2256)  Labs: Recent Labs    12/21/18 1501 12/22/18 0045  HGB 13.9 13.3  HCT 42.2 41.0  PLT 500* 522*  APTT 30  --   LABPROT 13.5  --   INR 1.0  --   HEPARINUNFRC  --  0.30  CREATININE 0.53  --     Estimated Creatinine Clearance: 90 mL/min (by C-G formula based on SCr of 0.53 mg/dL).   Medical History: Past Medical History:  Diagnosis Date  . Aortic thrombus ([place])   . 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 warfarin (COUMADIN) 1 MG tablet Take 1 tablet (1 mg total) by mouth daily. 30 tablet 0 12/20/2018 at 1800  . albuterol (PROVENTIL HFA;VENTOLIN HFA) 108 (90 Base) MCG/ACT inhaler Inhale 2 puffs into the lungs every 6 (six) hours as needed for wheezing or shortness of breath. (Patient not taking: Reported on 12/21/2018) 1 Inhaler 2 Not Taking at Unknown time  . nicotine (NICODERM CQ) 14 mg/24hr patch Place 1 patch (14 mg total) onto the skin daily. (Patient not taking: Reported on 12/21/2018) 28 patch 0 Not Taking at Unknown time  . oxyCODONE (OXY IR/ROXICODONE) 5 MG immediate release tablet Take 1 tablet (5 mg total) by mouth every 8 (eight) hours as needed for severe pain. (Patient not taking: Reported on 02/15/2016) 90 tablet 0   . warfarin (COUMADIN) 1 MG tablet TAKE 3 TABLETS BY MOUTH DAILY (Patient not taking: Reported on 12/21/2018) 90 tablet 0 Not Taking at Unknown time  . warfarin (COUMADIN) 1 MG tablet TAKE 1 TABLET BY MOUTH EVERY DAY (Patient not taking: Reported on 12/21/2018) 30 tablet 0 Not Taking at du  . warfarin  (COUMADIN) 5 MG tablet TAKE 1 TABLET(5 MG) BY MOUTH DAILY (Patient not taking: Reported on 12/21/2018) 30 tablet 6 Not Taking at Unknown time    Assessment: Pharmacy consulted to dose heparin in this 43 year old female admitted with aortic clot.  Pt was on warfarin PTA ;  INR on 9/1 = 1.0.   CrCl = 90 ml/min.  Pt received a dose of Warfarin 3mg  at [place].  0902 @ 0045 HL = 0.3, therapeutic at low end  Goal of Therapy:  Heparin level 0.3-0.7 units/ml Monitor platelets by anticoagulation protocol: Yes   Plan:  Continue Heparin at current rate (1200 units/hr) Recheck HL in 6 hours to confirm HL and CBC daily while on heparin protocol  [place] Crane, Ashlin Hidalgo A 12/22/2018,2:44 AM

## 2018-12-22 NOTE — Progress Notes (Signed)
Patient was escorted to her room by nursing supervisor, Esaw Dace. Patient was upset because she said she was approached by a staff member in the parking lot saying she could not be outside. Esaw Dace, night nurse Mallie Mussel, and myself talked with the patient and informed her that she could not go outside. She is allowed to leave the unit. I walked with the patient to make sure she knew where the vending machines were. Parke Simmers asked the patient to let her know if she leaves the unit and the patient said she would. Patient currently in her room relaxing

## 2018-12-22 NOTE — Progress Notes (Addendum)
[facility] for Heparin and Warfarin  Indication: aortic clot  No Known Allergies  Patient Measurements: Height: 5\' 7"  (170.2 cm) Weight: 141 lb 9.6 oz (64.2 kg) IBW/kg (Calculated) : 61.6 Heparin Dosing Weight:  68 kg   Vital Signs: Temp: 97.6 F (36.4 C) (09/02 1151) Temp Source: Oral (09/02 1151) BP: 117/64 (09/02 1151) Pulse Rate: 63 (09/02 1151)  Labs: Recent Labs    12/21/18 1501 12/22/18 0045 12/22/18 0839  HGB 13.9 13.3 13.0  HCT 42.2 41.0 39.7  PLT 500* 522* 483*  APTT 30  --   --   LABPROT 13.5  --  13.4  INR 1.0  --  1.0  HEPARINUNFRC  --  0.30 0.33  CREATININE 0.53  --  0.60    Estimated Creatinine Clearance: 90 mL/min (by C-G formula based on SCr of 0.6 mg/dL).   Medical History: Past Medical History:  Diagnosis Date  . Aortic thrombus ([place])   . 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 warfarin (COUMADIN) 1 MG tablet Take 1 tablet (1 mg total) by mouth daily. 30 tablet 0 12/20/2018 at 1800  . albuterol (PROVENTIL HFA;VENTOLIN HFA) 108 (90 Base) MCG/ACT inhaler Inhale 2 puffs into the lungs every 6 (six) hours as needed for wheezing or shortness of breath. (Patient not taking: Reported on 12/21/2018) 1 Inhaler 2 Not Taking at Unknown time  . nicotine (NICODERM CQ) 14 mg/24hr patch Place 1 patch (14 mg total) onto the skin daily. (Patient not taking: Reported on 12/21/2018) 28 patch 0 Not Taking at Unknown time  . oxyCODONE (OXY IR/ROXICODONE) 5 MG immediate release tablet Take 1 tablet (5 mg total) by mouth every 8 (eight) hours as needed for severe pain. (Patient not taking: Reported on 02/15/2016) 90 tablet 0   . warfarin (COUMADIN) 1 MG tablet TAKE 3 TABLETS BY MOUTH DAILY (Patient not taking: Reported on 12/21/2018) 90 tablet 0 Not Taking at Unknown time  . warfarin (COUMADIN) 1 MG tablet TAKE 1 TABLET BY MOUTH EVERY DAY (Patient not taking:  Reported on 12/21/2018) 30 tablet 0 Not Taking at du  . warfarin (COUMADIN) 5 MG tablet TAKE 1 TABLET(5 MG) BY MOUTH DAILY (Patient not taking: Reported on 12/21/2018) 30 tablet 6 Not Taking at Unknown time    Assessment: Pharmacy consulted to dose heparin and warfarin in 43 year old female admitted with aortic clot.  Pt was prescribed warfarin PTA, but has not been compliant with getting INR checks due to not having insurance.   Last PTA Warfarin dose in chart is 1mg  daily. Patient only on this dose because she was unable to get her INR checked regular. Per patient, she believes her INRs were in range when she was getting about 3mg  of warfarin a day.   Heparin 0902 @ 0045 HL = 0.3, therapeutic at low end 0902 @ 0839 HL: 0.33.   Warfarin  DATE INR DOSE 9/1 1.0 3mg  9/2 1.0    Goal of Therapy:  INR: 2-3 Heparin level 0.3-0.7 units/ml Monitor platelets by anticoagulation protocol: Yes   Plan:  HEPARIN: 0902 @ 0839 HL: 0.33.  Level is at the low end of therapeutic. Will increase infusion to rate 1250 units/hr.  Recheck confirmatory HL in 6 hours  HL and CBC daily while on heparin protocol  WARFARIN: Will order Warfarin 4mg  PO x 1 dose tonight.  INR order with AM labs.  Pharmacy  will continue to follow and order dose based on INR.   Pernell Dupre, PharmD, BCPS Clinical Pharmacist 12/22/2018 2:48 PM

## 2018-12-22 NOTE — Progress Notes (Signed)
[place] [facility] [place] NAME: Suzanne [name]    MR#:  [id number]  DATE OF BIRTH:  1975-09-30  SUBJECTIVE:  patient came in with bilateral lower extremity pain and flank pain since Friday on and off. She was found to have extension of her thrombus. Currently on IV heparin drip. Feels a lot better.  REVIEW OF SYSTEMS:   Review of Systems  Constitutional: Negative for chills, fever and weight loss.  HENT: Negative for ear discharge, ear pain and nosebleeds.   Eyes: Negative for blurred vision, pain and discharge.  Respiratory: Negative for sputum production, shortness of breath, wheezing and stridor.   Cardiovascular: Negative for chest pain, palpitations, orthopnea and PND.  Gastrointestinal: Negative for abdominal pain, diarrhea, nausea and vomiting.  Genitourinary: Negative for frequency and urgency.  Musculoskeletal: Positive for back pain. Negative for joint pain.  Neurological: Negative for sensory change, speech change, focal weakness and weakness.  Psychiatric/Behavioral: Negative for depression and hallucinations. The patient is not nervous/anxious.    Tolerating Diet:yes Tolerating PT: not needed  DRUG ALLERGIES:  No Known Allergies  VITALS:  Blood pressure 117/64, pulse 63, temperature 97.6 F (36.4 C), temperature source Oral, resp. rate 16, height 5\' 7"  (1.702 m), weight 64.2 kg, last menstrual period 11/26/2018, SpO2 97 %.  PHYSICAL EXAMINATION:   Physical Exam  GENERAL:  43 y.o.-year-old patient lying in the bed with no acute distress.  EYES: Pupils equal, round, reactive to light and accommodation. No scleral icterus. Extraocular muscles intact.  HEENT: Head atraumatic, normocephalic. Oropharynx and nasopharynx clear.  NECK:  Supple, no jugular venous distention. No thyroid enlargement, no tenderness.  LUNGS: Normal breath sounds bilaterally, no wheezing, rales, rhonchi. No use of accessory muscles of respiration.   CARDIOVASCULAR: S1, S2 normal. No murmurs, rubs, or gallops.  ABDOMEN: Soft, nontender, nondistended. Bowel sounds present. No organomegaly or mass.  EXTREMITIES: No cyanosis, clubbing or edema b/l.    NEUROLOGIC: Cranial nerves II through XII are intact. No focal Motor or sensory deficits b/l.   PSYCHIATRIC:  patient is alert and oriented x 3.  SKIN: No obvious rash, lesion, or ulcer.   LABORATORY PANEL:  CBC Recent Labs  Lab 12/22/18 0839  WBC 10.2  HGB 13.0  HCT 39.7  PLT 483*    Chemistries  Recent Labs  Lab 12/17/18 2308  12/22/18 0839  NA 134*   < > 139  K 3.5   < > 3.7  CL 102   < > 106  CO2 21*   < > 24  GLUCOSE 108*   < > 104*  BUN <5*   < > <5*  CREATININE 0.64   < > 0.60  CALCIUM 8.5*   < > 8.4*  AST 33  --   --   ALT 23  --   --   ALKPHOS 103  --   --   BILITOT 0.6  --   --    < > = values in this interval not displayed.   Cardiac Enzymes No results for input(s): TROPONINI in the last 168 hours. RADIOLOGY:  Ct Angio Chest/abd/pel For Dissection W And/or Wo Contrast  Result Date: 12/21/2018 CLINICAL DATA:  Chest pain and severe left upper quadrant pain with nausea and vomiting. Severe bilateral lower extremity pain. Tingling and numbness. History of aortic thrombus in September 2016. EXAM: CT ANGIOGRAPHY CHEST, ABDOMEN AND PELVIS TECHNIQUE: Multidetector CT imaging through the chest, abdomen and pelvis was performed using the standard  protocol during bolus administration of intravenous contrast. Multiplanar reconstructed images and MIPs were obtained and reviewed to evaluate the vascular anatomy. CONTRAST:  140mL OMNIPAQUE IOHEXOL 350 MG/ML SOLN COMPARISON:  CT angiograms dated 11/21/2016, 07/19/2015 and 01/18/2015 and 01/16/2015 FINDINGS: CTA CHEST FINDINGS Cardiovascular: The patient has developed recurrent thrombus in the anterior aspect of the distal thoracic aorta. Heart appears normal. No pericardial effusion. No appreciable pulmonary emboli.  Mediastinum/Nodes: No enlarged mediastinal, hilar, or axillary lymph nodes. Thyroid gland, trachea, and esophagus demonstrate no significant findings. 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 The lungs are otherwise clear. No effusions. Musculoskeletal: No chest wall abnormality. No acute or significant osseous findings. Review of the MIP images confirms the above findings. CTA ABDOMEN AND PELVIS FINDINGS VASCULAR Aorta: There is new thrombus in the anterior aspect of the abdominal aorta just below the level of the renal arteries there is calcification and aorta just above the aortic bifurcation. Celiac: Patent without evidence of aneurysm, dissection, vasculitis or significant stenosis. SMA: Patent without evidence of aneurysm, dissection, vasculitis or significant stenosis. Renals: Single renal arteries bilaterally. Multiple patchy areas of parenchymal infarction in the left kidney. IMA: Patent without evidence of aneurysm, dissection, vasculitis or significant stenosis. Inflow: Thrombus in the descending thoracic aorta as described above. Veins: There is haziness surrounding the left common femoral vein in the left groin which suspect represents venous thrombosis. Arteries of the proximal thighs: There is a nonocclusive thrombus in the proximal right profunda femoris artery. 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 These are new since the prior exams. Review of the MIP images confirms the above findings. NON-VASCULAR Hepatobiliary: No focal liver abnormality is seen. No gallstones, gallbladder wall thickening, or biliary dilatation. Pancreas: Unremarkable. No pancreatic ductal dilatation or surrounding inflammatory changes. Spleen: The spleen is irregular and slightly smaller than on prior studies consistent with the history of prior splenic infarcts. No discrete acute splenic infarct.  Adrenals/Urinary Tract: Multiple new areas of low-density parenchyma of left kidney consistent with acute renal infarcts. Right kidney appears normal. Adrenal glands are normal. No hydronephrosis. Bladder is normal. Stomach/Bowel: Stomach is within normal limits. Appendix appears normal. No evidence of bowel wall thickening, distention, or inflammatory changes. Lymphatic: No adenopathy. Reproductive: 17 mm partially collapsed cyst on the left ovary. Right ovary and uterus appear normal. Other: No abdominal wall hernia or abnormality. No abdominopelvic ascites. Musculoskeletal: No acute abnormality. Moderate arthritic changes of the right acetabulum. Slight arthritic changes left acetabulum. Review of the MIP images confirms the above findings. IMPRESSION: 1. 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 2. Multiple new small renal infarctions. 3. 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 4. Nonocclusive thrombus in the proximal right profunda femoris artery. 5. Probable thrombus of the left common femoral vein extending distally into the left thigh. Critical Value/emergent results were called by telephone at the time of interpretation on 12/21/2018 at 5:23 pm to Dr. Duffy Bruce , who verbally acknowledged these results. Electronically Signed   By: Lorriane Shire M.D.   On: 12/21/2018 17:43   ASSESSMENT AND PLAN:   Rickea Schutz  is a 43 y.o. female with a known history of aortic thrombus and splenic infarct and was advised to take warfarin every day. She started having lower back pain last Friday and came to emergency room and went home and then continued to have lower back pain  and claudication with pain in both legs and came to emergency room today again  *Aortic thrombus,bilatera renal infarct,and known h/o deep vein thrombosis Subtherapeutic INR. Started on heparin IV drip. Vascular consult with Dr Lucky Cowboy-- await recommendations pharmacy to  dose Coumadin.  *Active smoking Counseled to quit smoking for 4 minutes and offered nicotine patch.  *Hypokalemia Replace oral.  Case discussed with Care Management/Social Worker. Management plans discussed with the patient and they are in agreement.  CODE STATUS: full  DVT Prophylaxis: heparin drip  TOTAL TIME TAKING CARE OF THIS PATIENT: *30* minutes.  >50% time spent on counselling and coordination of care  POSSIBLE D/C IN *1-2* DAYS, DEPENDING ON CLINICAL CONDITION.  Note: This dictation was prepared with Dragon dictation along with smaller phrase technology. Any transcriptional errors that result from this process are unintentional.  Fritzi Mandes M.D on 12/22/2018 at 4:02 PM  Between 7am to 6pm - Pager - [phone]  After 6pm go to [email or web address] - password [email or web address] [facility]  Office  [phone]  CC: Primary care physician; System, Pcp Not InPatient ID: ARY HELMAN, female   DOB: 1975-09-01, 43 y.o.   MRN: [id number]

## 2018-12-22 NOTE — Progress Notes (Signed)
ANTICOAGULATION CONSULT NOTE   Pharmacy Consult for Heparin and Warfarin  Indication: aortic clot  No Known Allergies  Patient Measurements: Height: 5\' 7"  (170.2 cm) Weight: 141 lb 9.6 oz (64.2 kg) IBW/kg (Calculated) : 61.6 Heparin Dosing Weight:  68 kg   Vital Signs: Temp: 98.5 F (36.9 C) (09/02 2035) Temp Source: Oral (09/02 2035) BP: 156/103 (09/02 2035) Pulse Rate: 68 (09/02 2035)  Labs: Recent Labs    12/21/18 1501  12/22/18 0045 12/22/18 0839 12/22/18 1606 12/22/18 2252  HGB 13.9  --  13.3 13.0  --   --   HCT 42.2  --  41.0 39.7  --   --   PLT 500*  --  522* 483*  --   --   APTT 30  --   --   --   --   --   LABPROT 13.5  --   --  13.4  --   --   INR 1.0  --   --  1.0  --   --   HEPARINUNFRC  --    < > 0.30 0.33 0.22* 0.32  CREATININE 0.53  --   --  0.60  --   --    < > = values in this interval not displayed.    Estimated Creatinine Clearance: 90 mL/min (by C-G formula based on SCr of 0.6 mg/dL).   Medical History: Past Medical History:  Diagnosis Date  . Aortic thrombus ([place])   . 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 warfarin (COUMADIN) 1 MG tablet Take 1 tablet (1 mg total) by mouth daily. 30 tablet 0 12/20/2018 at 1800  . albuterol (PROVENTIL HFA;VENTOLIN HFA) 108 (90 Base) MCG/ACT inhaler Inhale 2 puffs into the lungs every 6 (six) hours as needed for wheezing or shortness of breath. (Patient not taking: Reported on 12/21/2018) 1 Inhaler 2 Not Taking at Unknown time  . nicotine (NICODERM CQ) 14 mg/24hr patch Place 1 patch (14 mg total) onto the skin daily. (Patient not taking: Reported on 12/21/2018) 28 patch 0 Not Taking at Unknown time  . oxyCODONE (OXY IR/ROXICODONE) 5 MG immediate release tablet Take 1 tablet (5 mg total) by mouth every 8 (eight) hours as needed for severe pain. (Patient not taking: Reported on 02/15/2016) 90 tablet 0   . warfarin (COUMADIN) 1 MG tablet  TAKE 3 TABLETS BY MOUTH DAILY (Patient not taking: Reported on 12/21/2018) 90 tablet 0 Not Taking at Unknown time  . warfarin (COUMADIN) 1 MG tablet TAKE 1 TABLET BY MOUTH EVERY DAY (Patient not taking: Reported on 12/21/2018) 30 tablet 0 Not Taking at du  . warfarin (COUMADIN) 5 MG tablet TAKE 1 TABLET(5 MG) BY MOUTH DAILY (Patient not taking: Reported on 12/21/2018) 30 tablet 6 Not Taking at Unknown time    Assessment: Pharmacy consulted to dose heparin and warfarin in 43 year old female admitted with aortic clot.  Pt was prescribed warfarin PTA, but has not been compliant with getting INR checks due to not having insurance.   Last PTA Warfarin dose in chart is 1mg  daily. Patient only on this dose because she was unable to get her INR checked regular. Per patient, she believes her INRs were in range when she was getting about 3mg  of warfarin a day.   Heparin 0902 @ 0045 HL = 0.3, therapeutic at low end 0902 @ 0839 HL: 0.33. rate increase to 1250 units/hr 0902 @  1606 HL: 0.22  0902 @ 2252 HL = 0.32, therapeutic at low end on 1350 units/hr   Warfarin  DATE INR DOSE 9/1 1.0 3mg  9/2 1.0    Goal of Therapy:  INR: 2-3 Heparin level 0.3-0.7 units/ml Monitor platelets by anticoagulation protocol: Yes   Plan:  HEPARIN: Heparin level therapeutic. Will continue infusion at 1350 units/hr.  Recheck  HL with daily labs (0500)  HL and CBC daily while on heparin protocol  WARFARIN: Will order Warfarin 4mg  PO x 1 dose tonight.  INR order with AM labs.  Pharmacy will continue to follow and order dose based on INR.   Ena Dawley, PharmD Clinical Pharmacist 12/22/2018 11:39 PM

## 2018-12-22 NOTE — Progress Notes (Signed)
[facility] for Heparin  Indication: aortic clot  No Known Allergies  Patient Measurements: Height: 5\' 7"  (170.2 cm) Weight: 141 lb 9.6 oz (64.2 kg) IBW/kg (Calculated) : 61.6 Heparin Dosing Weight:  68 kg   Vital Signs: Temp: 98 F (36.7 C) (09/02 0559) Temp Source: Oral (09/02 0559) BP: 155/82 (09/02 0559) Pulse Rate: 58 (09/02 0559)  Labs: Recent Labs    12/21/18 1501 12/22/18 0045 12/22/18 0839  HGB 13.9 13.3 13.0  HCT 42.2 41.0 39.7  PLT 500* 522* 483*  APTT 30  --   --   LABPROT 13.5  --   --   INR 1.0  --   --   HEPARINUNFRC  --  0.30 0.33  CREATININE 0.53  --  0.60    Estimated Creatinine Clearance: 90 mL/min (by C-G formula based on SCr of 0.6 mg/dL).   Medical History: Past Medical History:  Diagnosis Date  . Aortic thrombus ([place])   . 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 warfarin (COUMADIN) 1 MG tablet Take 1 tablet (1 mg total) by mouth daily. 30 tablet 0 12/20/2018 at 1800  . albuterol (PROVENTIL HFA;VENTOLIN HFA) 108 (90 Base) MCG/ACT inhaler Inhale 2 puffs into the lungs every 6 (six) hours as needed for wheezing or shortness of breath. (Patient not taking: Reported on 12/21/2018) 1 Inhaler 2 Not Taking at Unknown time  . nicotine (NICODERM CQ) 14 mg/24hr patch Place 1 patch (14 mg total) onto the skin daily. (Patient not taking: Reported on 12/21/2018) 28 patch 0 Not Taking at Unknown time  . oxyCODONE (OXY IR/ROXICODONE) 5 MG immediate release tablet Take 1 tablet (5 mg total) by mouth every 8 (eight) hours as needed for severe pain. (Patient not taking: Reported on 02/15/2016) 90 tablet 0   . warfarin (COUMADIN) 1 MG tablet TAKE 3 TABLETS BY MOUTH DAILY (Patient not taking: Reported on 12/21/2018) 90 tablet 0 Not Taking at Unknown time  . warfarin (COUMADIN) 1 MG tablet TAKE 1 TABLET BY MOUTH EVERY DAY (Patient not taking: Reported on  12/21/2018) 30 tablet 0 Not Taking at du  . warfarin (COUMADIN) 5 MG tablet TAKE 1 TABLET(5 MG) BY MOUTH DAILY (Patient not taking: Reported on 12/21/2018) 30 tablet 6 Not Taking at Unknown time    Assessment: Pharmacy consulted to dose heparin in this 43 year old female admitted with aortic clot.  Pt was on warfarin PTA ;  INR on 9/1 = 1.0.   CrCl = 90 ml/min.  Pt received a dose of Warfarin 3mg  at 1829.  0902 @ 0045 HL = 0.3, therapeutic at low end  Goal of Therapy:  Heparin level 0.3-0.7 units/ml Monitor platelets by anticoagulation protocol: Yes   Plan:  0902 @ 0839 HL: 0.33.  Level is at the low end of therapeutic. Will increase infusion to rate 1250 units/hr.  Recheck HL in 6 hours to confirm HL and CBC daily while on heparin protocol  Pernell Dupre, PharmD, BCPS Clinical Pharmacist 12/22/2018 9:46 AM

## 2018-12-22 NOTE — Progress Notes (Signed)
Patient was very upset this am because she was NPO and she didn't know why. Dr Lucky Cowboy notified and placed an order for a diet. Patient just now asked to have her diet changed to regular and her heart monitor discontinued. Dr Posey Pronto approved both

## 2018-12-22 NOTE — Progress Notes (Signed)
[facility] for Heparin and Warfarin  Indication: aortic clot  No Known Allergies  Patient Measurements: Height: 5\' 7"  (170.2 cm) Weight: 141 lb 9.6 oz (64.2 kg) IBW/kg (Calculated) : 61.6 Heparin Dosing Weight:  68 kg   Vital Signs: Temp: 97.6 F (36.4 C) (09/02 1151) Temp Source: Oral (09/02 1151) BP: 117/64 (09/02 1151) Pulse Rate: 63 (09/02 1151)  Labs: Recent Labs    12/21/18 1501 12/22/18 0045 12/22/18 0839 12/22/18 1606  HGB 13.9 13.3 13.0  --   HCT 42.2 41.0 39.7  --   PLT 500* 522* 483*  --   APTT 30  --   --   --   LABPROT 13.5  --  13.4  --   INR 1.0  --  1.0  --   HEPARINUNFRC  --  0.30 0.33 0.22*  CREATININE 0.53  --  0.60  --     Estimated Creatinine Clearance: 90 mL/min (by C-G formula based on SCr of 0.6 mg/dL).   Medical History: Past Medical History:  Diagnosis Date  . Aortic thrombus ([place])   . 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 warfarin (COUMADIN) 1 MG tablet Take 1 tablet (1 mg total) by mouth daily. 30 tablet 0 12/20/2018 at 1800  . albuterol (PROVENTIL HFA;VENTOLIN HFA) 108 (90 Base) MCG/ACT inhaler Inhale 2 puffs into the lungs every 6 (six) hours as needed for wheezing or shortness of breath. (Patient not taking: Reported on 12/21/2018) 1 Inhaler 2 Not Taking at Unknown time  . nicotine (NICODERM CQ) 14 mg/24hr patch Place 1 patch (14 mg total) onto the skin daily. (Patient not taking: Reported on 12/21/2018) 28 patch 0 Not Taking at Unknown time  . oxyCODONE (OXY IR/ROXICODONE) 5 MG immediate release tablet Take 1 tablet (5 mg total) by mouth every 8 (eight) hours as needed for severe pain. (Patient not taking: Reported on 02/15/2016) 90 tablet 0   . warfarin (COUMADIN) 1 MG tablet TAKE 3 TABLETS BY MOUTH DAILY (Patient not taking: Reported on 12/21/2018) 90 tablet 0 Not Taking at Unknown time  . warfarin (COUMADIN) 1 MG tablet  TAKE 1 TABLET BY MOUTH EVERY DAY (Patient not taking: Reported on 12/21/2018) 30 tablet 0 Not Taking at du  . warfarin (COUMADIN) 5 MG tablet TAKE 1 TABLET(5 MG) BY MOUTH DAILY (Patient not taking: Reported on 12/21/2018) 30 tablet 6 Not Taking at Unknown time    Assessment: Pharmacy consulted to dose heparin and warfarin in 43 year old female admitted with aortic clot.  Pt was prescribed warfarin PTA, but has not been compliant with getting INR checks due to not having insurance.   Last PTA Warfarin dose in chart is 1mg  daily. Patient only on this dose because she was unable to get her INR checked regular. Per patient, she believes her INRs were in range when she was getting about 3mg  of warfarin a day.   Heparin 0902 @ 0045 HL = 0.3, therapeutic at low end 0902 @ 0839 HL: 0.33. rate increase to 1250 units/hr 0902 @ 1606 HL: 0.22    Warfarin  DATE INR DOSE 9/1 1.0 3mg  9/2 1.0    Goal of Therapy:  INR: 2-3 Heparin level 0.3-0.7 units/ml Monitor platelets by anticoagulation protocol: Yes   Plan:  HEPARIN: Heparin level subtherapeutic. Will bolus 950 units. Will increase infusion to rate 1350 units/hr.  Recheck  HL in 6 hours  HL and CBC daily while on heparin protocol  WARFARIN: Will order Warfarin 4mg  PO x 1 dose tonight.  INR order with AM labs.  Pharmacy will continue to follow and order dose based on INR.   Oswald Hillock, PharmD, BCPS Clinical Pharmacist 12/22/2018 4:46 PM

## 2018-12-22 NOTE — Consult Note (Signed)
[facility] Vascular Consult Note  MRN : [id number]  Suzanne [name] is a 43 y.o. ([birth year]) female who presents with chief complaint of  Chief Complaint  Patient presents with  . Abdominal Pain  .  History of Present Illness: I am asked to see the patient by Dr. Cinda Quest for recurrent aortic thrombus.  The patient is known to our service and I have seen her in the office and in the hospital previously for thrombotic issues.  She was taking Coumadin but only 1 mg and had not had her INR checked in some time.  She was having abdominal pain as well as leg pain which prompted a visit to the emergency room.  She initially was sent home to the emergency room 2 days ago, but returned last night with worsening abdominal pain.  She was found to be subtherapeutic on her INR and a CT scan of the chest abdomen pelvis was performed which I have independently reviewed.  She has recurrent thoracic aortic thrombus although the amount of thrombus appears to be smaller than her first diagnosis years ago.  She also has a small amount of aortic thrombus in her infrarenal aorta, renal artery infarcts, and evidence of thrombosis/embolus to both profunda femoris arteries.  The common femoral arteries and SFAs appear to be patent.  She has been placed on heparin and has done well overnight.  She says she feels many times better than she did prior to admission.  She is tolerating diet and wants to get up and walk more.  Current Facility-Administered Medications  Medication Dose Route Frequency Provider Last Rate Last Dose  . albuterol (PROVENTIL) (2.5 MG/3ML) 0.083% nebulizer solution 2.5 mg  2.5 mg Inhalation Q6H PRN Vaughan Basta, MD      . docusate sodium (COLACE) capsule 100 mg  100 mg Oral BID PRN Vaughan Basta, MD      . heparin ADULT infusion 100 units/mL (25000 units/233mL sodium chloride 0.45%)  1,350 Units/hr Intravenous Continuous Fritzi Mandes, MD 13.5 mL/hr at  12/22/18 1815 1,350 Units/hr at 12/22/18 1815  . nicotine (NICODERM CQ - dosed in mg/24 hours) patch 14 mg  14 mg Transdermal Daily Vaughan Basta, MD   14 mg at 12/22/18 0931  . oxyCODONE (Oxy IR/ROXICODONE) immediate release tablet 5 mg  5 mg Oral Q8H PRN Vaughan Basta, MD   5 mg at 12/22/18 1813  . Warfarin - Pharmacist Dosing Inpatient   Does not apply q1800 Pernell Dupre, [facility]        Past Medical History:  Diagnosis Date  . Aortic thrombus ([place])   . 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 CESAREAN SECTION     times 4    Social History Social History   Tobacco Use  . Smoking status: Current Some Day Smoker    Packs/day: 0.25    Years: 20.00    Pack years: 5.00    Types: Cigarettes  . Smokeless tobacco: Never Used  Substance Use Topics  . Alcohol use: Yes    Comment: occ  . Drug use: No    Family History Family History  Adopted: Yes  Problem Relation Age of Onset  . Hypertension Mother   No bleeding or clotting disorders, no aneurysms  No Known Allergies   REVIEW OF SYSTEMS (Negative unless checked)  Constitutional: [] Weight loss  [] Fever  [] Chills Cardiac: [] Chest pain   [] Chest pressure   [] Palpitations   [] Shortness of  breath when laying flat   [] Shortness of breath at rest   [] Shortness of breath with exertion. Vascular:  [x] Pain in legs with walking   [] Pain in legs at rest   [] Pain in legs when laying flat   [] Claudication   [] Pain in feet when walking  [] Pain in feet at rest  [] Pain in feet when laying flat   [] History of DVT   [] Phlebitis   [] Swelling in legs   [] Varicose veins   [] Non-healing ulcers Pulmonary:   [] Uses home oxygen   [] Productive cough   [] Hemoptysis   [] Wheeze  [] COPD   [] Asthma Neurologic:  [] Dizziness  [] Blackouts   [] Seizures   [] History of stroke   [] History of TIA  [] Aphasia   [] Temporary blindness   [] Dysphagia   [] Weakness or numbness in arms   [] Weakness or numbness in  legs Musculoskeletal:  [] Arthritis   [] Joint swelling   [] Joint pain   [] Low back pain Hematologic:  [] Easy bruising  [] Easy bleeding   [] Hypercoagulable state   [] Anemic  [] Hepatitis Gastrointestinal:  [] Blood in stool   [] Vomiting blood  [] Gastroesophageal reflux/heartburn   [] Difficulty swallowing. X positive for abdominal pain Genitourinary:  [] Chronic kidney disease   [] Difficult urination  [] Frequent urination  [] Burning with urination   [] Blood in urine Skin:  [] Rashes   [] Ulcers   [] Wounds Psychological:  [] History of anxiety   []  History of major depression.  Physical Examination  Vitals:   12/21/18 2200 12/21/18 2256 12/22/18 0559 12/22/18 1151  BP: (!) 170/94 (!) 186/96 (!) 155/82 117/64  Pulse: (!) 58 60 (!) 58 63  Resp: 19 18 16    Temp:  98.6 F (37 C) 98 F (36.7 C) 97.6 F (36.4 C)  TempSrc:  Oral Oral Oral  SpO2: 98% 99% 98% 97%  Weight:  64.2 kg    Height:  5\' 7"  (1.702 m)     Body mass index is 22.18 kg/m. Gen:  WD/WN, NAD Head: [place]/AT, No temporalis wasting.  Ear/Nose/Throat: Hearing grossly intact, nares w/o erythema or drainage, oropharynx w/o Erythema/Exudate Eyes: Sclera non-icteric, conjunctiva clear Neck: Trachea midline.  No JVD.  Pulmonary:  Good air movement, respirations not labored, equal bilaterally.  Cardiac: RRR, normal S1, S2. Vascular:  Vessel Right Left  Radial Palpable Palpable                          PT Palpable Palpable  DP Palpable Palpable   Gastrointestinal: soft, non-tender/non-distended. No guarding/reflex.  Musculoskeletal: M/S 5/5 throughout.  Extremities without ischemic changes.  No deformity or atrophy. No edema. Neurologic: Sensation grossly intact in extremities.  Symmetrical.  Speech is fluent. Motor exam as listed above. Psychiatric: Judgment intact, Mood & affect appropriate for pt's clinical situation. Dermatologic: No rashes or ulcers noted.  No cellulitis or open wounds.       CBC Lab Results  Component  Value Date   WBC 10.2 12/22/2018   HGB 13.0 12/22/2018   HCT 39.7 12/22/2018   MCV 91.9 12/22/2018   PLT 483 (H) 12/22/2018    BMET    Component Value Date/Time   NA 139 12/22/2018 0839   NA 140 04/23/2011 1538   K 3.7 12/22/2018 0839   K 3.9 04/23/2011 1538   CL 106 12/22/2018 0839   CL 105 04/23/2011 1538   CO2 24 12/22/2018 0839   CO2 25 04/23/2011 1538   GLUCOSE 104 (H) 12/22/2018 0839   GLUCOSE 117 (H) 04/23/2011 1538  BUN <5 (L) 12/22/2018 0839   BUN 2 (L) 04/23/2011 1538   CREATININE 0.60 12/22/2018 0839   CREATININE 0.63 04/23/2011 1538   CALCIUM 8.4 (L) 12/22/2018 0839   CALCIUM 8.9 04/23/2011 1538   GFRNONAA >60 12/22/2018 0839   GFRNONAA >60 04/23/2011 1538   GFRAA >60 12/22/2018 0839   GFRAA >60 04/23/2011 1538   Estimated Creatinine Clearance: 90 mL/min (by C-G formula based on SCr of 0.6 mg/dL).  COAG Lab Results  Component Value Date   INR 1.0 12/22/2018   INR 1.0 12/21/2018   INR 3.60 09/09/2017    Radiology Ct Angio Chest/abd/pel For Dissection W And/or Wo Contrast  Result Date: 12/21/2018 CLINICAL DATA:  Chest pain and severe left upper quadrant pain with nausea and vomiting. Severe bilateral lower extremity pain. Tingling and numbness. History of aortic thrombus in September 2016. EXAM: CT ANGIOGRAPHY CHEST, ABDOMEN AND PELVIS TECHNIQUE: Multidetector CT imaging through the chest, abdomen and pelvis was performed using the standard protocol during bolus administration of intravenous contrast. Multiplanar reconstructed images and MIPs were obtained and reviewed to evaluate the vascular anatomy. CONTRAST:  156mL OMNIPAQUE IOHEXOL 350 MG/ML SOLN COMPARISON:  CT angiograms dated 11/21/2016, 07/19/2015 and 01/18/2015 and 01/16/2015 FINDINGS: CTA CHEST FINDINGS Cardiovascular: The patient has developed recurrent thrombus in the anterior aspect of the distal thoracic aorta. Heart appears normal. No pericardial effusion. No appreciable pulmonary emboli.  Mediastinum/Nodes: No enlarged mediastinal, hilar, or axillary lymph nodes. Thyroid gland, trachea, and esophagus demonstrate no significant findings. 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 The lungs are otherwise clear. No effusions. Musculoskeletal: No chest wall abnormality. No acute or significant osseous findings. Review of the MIP images confirms the above findings. CTA ABDOMEN AND PELVIS FINDINGS VASCULAR Aorta: There is new thrombus in the anterior aspect of the abdominal aorta just below the level of the renal arteries there is calcification and aorta just above the aortic bifurcation. Celiac: Patent without evidence of aneurysm, dissection, vasculitis or significant stenosis. SMA: Patent without evidence of aneurysm, dissection, vasculitis or significant stenosis. Renals: Single renal arteries bilaterally. Multiple patchy areas of parenchymal infarction in the left kidney. IMA: Patent without evidence of aneurysm, dissection, vasculitis or significant stenosis. Inflow: Thrombus in the descending thoracic aorta as described above. Veins: There is haziness surrounding the left common femoral vein in the left groin which suspect represents venous thrombosis. Arteries of the proximal thighs: There is a nonocclusive thrombus in the proximal right profunda femoris artery. 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 These are new since the prior exams. Review of the MIP images confirms the above findings. NON-VASCULAR Hepatobiliary: No focal liver abnormality is seen. No gallstones, gallbladder wall thickening, or biliary dilatation. Pancreas: Unremarkable. No pancreatic ductal dilatation or surrounding inflammatory changes. Spleen: The spleen is irregular and slightly smaller than on prior studies consistent with the history of prior splenic infarcts. No discrete acute splenic infarct.  Adrenals/Urinary Tract: Multiple new areas of low-density parenchyma of left kidney consistent with acute renal infarcts. Right kidney appears normal. Adrenal glands are normal. No hydronephrosis. Bladder is normal. Stomach/Bowel: Stomach is within normal limits. Appendix appears normal. No evidence of bowel wall thickening, distention, or inflammatory changes. Lymphatic: No adenopathy. Reproductive: 17 mm partially collapsed cyst on the left ovary. Right ovary and uterus appear normal. Other: No abdominal wall hernia or abnormality. No abdominopelvic ascites. Musculoskeletal: No acute abnormality. Moderate arthritic changes of the right  acetabulum. Slight arthritic changes left acetabulum. Review of the MIP images confirms the above findings. IMPRESSION: 1. 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 2. Multiple new small renal infarctions. 3. 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 4. Nonocclusive thrombus in the proximal right profunda femoris artery. 5. Probable thrombus of the left common femoral vein extending distally into the left thigh. Critical Value/emergent results were called by telephone at the time of interpretation on 12/21/2018 at 5:23 pm to Dr. Duffy Bruce , who verbally acknowledged these results. Electronically Signed   By: Lorriane Shire M.D.   On: 12/21/2018 17:43      Assessment/Plan 1.  Recurrent thoracic aortic thrombosis.  No role for intervention currently, and would recommend anticoagulation.  She will need lifelong anticoagulation will need to stay therapeutic on her Coumadin we may ultimately consider covered stent placement in the area of her thrombosis to avoid recurrent problems, but I would not do this in the acute phase 2.  Renal infarct.  From #1 above.  Anticoagulation alone. 3.  Thrombus in bilateral profunda femoris arteries.  No plan for surgery at current with plan to continue anticoagulation as her legs  do not appear ischemic currently. 4.  Hypercoagulable condition.  The patient has some clear hypercoagulable condition and lifelong anticoagulation will be recommended.   [name], MD  12/22/2018 6:33 PM    This note was created with Dragon medical transcription system.  Any error is purely unintentional

## 2018-12-23 ENCOUNTER — Other Ambulatory Visit (INDEPENDENT_AMBULATORY_CARE_PROVIDER_SITE_OTHER): Payer: Self-pay | Admitting: Vascular Surgery

## 2018-12-23 DIAGNOSIS — I7410 Embolism and thrombosis of unspecified parts of aorta: Secondary | ICD-10-CM

## 2018-12-23 LAB — CBC
HCT: 40.9 % (ref 36.0–46.0)
Hemoglobin: 13.2 g/dL (ref 12.0–15.0)
MCH: 29.8 pg (ref 26.0–34.0)
MCHC: 32.3 g/dL (ref 30.0–36.0)
MCV: 92.3 fL (ref 80.0–100.0)
Platelets: 547 10*3/uL — ABNORMAL HIGH (ref 150–400)
RBC: 4.43 MIL/uL (ref 3.87–5.11)
RDW: 14.4 % (ref 11.5–15.5)
WBC: 10.2 10*3/uL (ref 4.0–10.5)
nRBC: 0 % (ref 0.0–0.2)

## 2018-12-23 LAB — HIV ANTIBODY (ROUTINE TESTING W REFLEX): HIV Screen 4th Generation wRfx: NONREACTIVE

## 2018-12-23 LAB — PROTIME-INR
INR: 1.1 (ref 0.8–1.2)
Prothrombin Time: 13.7 seconds (ref 11.4–15.2)

## 2018-12-23 LAB — HEPARIN LEVEL (UNFRACTIONATED): Heparin Unfractionated: 0.29 IU/mL — ABNORMAL LOW (ref 0.30–0.70)

## 2018-12-23 MED ORDER — APIXABAN 5 MG PO TABS: 5.0000 mg | ORAL_TABLET | Freq: Two times a day (BID) | ORAL | Status: DC

## 2018-12-23 MED ORDER — APIXABAN 5 MG PO TABS
10.0000 mg | ORAL_TABLET | Freq: Two times a day (BID) | ORAL | Status: DC
  Administered 2018-12-23: 10 mg via ORAL
  Filled 2018-12-23: qty 2

## 2018-12-23 MED ORDER — WARFARIN SODIUM 7.5 MG PO TABS
7.5000 mg | ORAL_TABLET | Freq: Once | ORAL | Status: DC
  Filled 2018-12-23: qty 1

## 2018-12-23 MED ORDER — APIXABAN 5 MG PO TABS: 10.0000 mg | ORAL_TABLET | Freq: Two times a day (BID) | ORAL | 3 refills | Status: DC

## 2018-12-23 MED ORDER — WARFARIN SODIUM 5 MG PO TABS: 5.0000 mg | ORAL_TABLET | Freq: Once | ORAL | Status: DC

## 2018-12-23 MED ORDER — ACETAMINOPHEN 325 MG PO TABS
650.0000 mg | ORAL_TABLET | Freq: Four times a day (QID) | ORAL | Status: DC | PRN
  Administered 2018-12-23: 650 mg via ORAL
  Filled 2018-12-23: qty 2

## 2018-12-23 NOTE — TOC Transition Note (Signed)
Transition of Care [facility]) - CM/SW Discharge Note   Patient Details  Name: Suzanne [name] MRN: [id number] Date of Birth: 1975-10-21  [facility] CM/SW Contact:  Candie Chroman, LCSW Phone Number: 12/23/2018, 11:49 AM   Clinical Narrative: CSW met with patient, introduced role, and inquired about needs. Patient going through a divorce and therefore lost her insurance. She typically follows up with her PCP at [facility] but has been unable to since losing her insurance. Provided booklet for free and low-cost healthcare in [facility] as well as intake paperwork for [facility] and [facility] application. Notified her that she needs to turn in the [facility] application within 30 days to continue getting medications through them. She also needs to get a PCP to continue prescribing her medications. Patient will pick up her Eliquis at [facility]. It will be ready in about 30 minutes. Her son will pick her up and take her home today. No further concerns. CSW signing off.       Final next level of care: Home/Self Care Barriers to Discharge: Barriers Resolved   Patient Goals and CMS Choice     Choice offered to / list presented to : NA  Discharge Placement                       Discharge Plan and Services   Discharge Planning Services: Medication Assistance                                 Social Determinants of Health (SDOH) Interventions     Readmission Risk Interventions No flowsheet data found.

## 2018-12-23 NOTE — Progress Notes (Addendum)
[place] for Heparin and Warfarin  Indication: aortic clot  No Known Allergies  Patient Measurements: Height: 5\' 7"  (170.2 cm) Weight: 141 lb 9.6 oz (64.2 kg) IBW/kg (Calculated) : 61.6 Heparin Dosing Weight:  68 kg   Vital Signs: Temp: 98.3 F (36.8 C) (09/03 0447) Temp Source: Oral (09/03 0447) BP: 131/69 (09/03 0447) Pulse Rate: 57 (09/03 0447)  Labs: Recent Labs    12/21/18 1501  12/22/18 0045 12/22/18 0839 12/22/18 1606 12/22/18 2252 12/23/18 0459  HGB 13.9  --  13.3 13.0  --   --  13.2  HCT 42.2  --  41.0 39.7  --   --  40.9  PLT 500*  --  522* 483*  --   --  547*  APTT 30  --   --   --   --   --   --   LABPROT 13.5  --   --  13.4  --   --  13.7  INR 1.0  --   --  1.0  --   --  1.1  HEPARINUNFRC  --    < > 0.30 0.33 0.22* 0.32 0.29*  CREATININE 0.53  --   --  0.60  --   --   --    < > = values in this interval not displayed.    Estimated Creatinine Clearance: 90 mL/min (by C-G formula based on SCr of 0.6 mg/dL).   Medical History: Past Medical History:  Diagnosis Date  . Aortic thrombus ([place])   . 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 warfarin (COUMADIN) 1 MG tablet Take 1 tablet (1 mg total) by mouth daily. 30 tablet 0 12/20/2018 at 1800  . albuterol (PROVENTIL HFA;VENTOLIN HFA) 108 (90 Base) MCG/ACT inhaler Inhale 2 puffs into the lungs every 6 (six) hours as needed for wheezing or shortness of breath. (Patient not taking: Reported on 12/21/2018) 1 Inhaler 2 Not Taking at Unknown time  . nicotine (NICODERM CQ) 14 mg/24hr patch Place 1 patch (14 mg total) onto the skin daily. (Patient not taking: Reported on 12/21/2018) 28 patch 0 Not Taking at Unknown time  . oxyCODONE (OXY IR/ROXICODONE) 5 MG immediate release tablet Take 1 tablet (5 mg total) by mouth every 8 (eight) hours as needed for severe pain. (Patient not taking: Reported on  02/15/2016) 90 tablet 0   . warfarin (COUMADIN) 1 MG tablet TAKE 3 TABLETS BY MOUTH DAILY (Patient not taking: Reported on 12/21/2018) 90 tablet 0 Not Taking at Unknown time  . warfarin (COUMADIN) 1 MG tablet TAKE 1 TABLET BY MOUTH EVERY DAY (Patient not taking: Reported on 12/21/2018) 30 tablet 0 Not Taking at du  . warfarin (COUMADIN) 5 MG tablet TAKE 1 TABLET(5 MG) BY MOUTH DAILY (Patient not taking: Reported on 12/21/2018) 30 tablet 6 Not Taking at Unknown time    Assessment: Pharmacy consulted to dose heparin and warfarin in 43 year old female admitted with aortic clot.  Pt was prescribed warfarin PTA, but has not been compliant with getting INR checks due to not having insurance.   Last PTA Warfarin dose in chart is 1mg  daily. Patient only on this dose because she was unable to get her INR checked regular. Per patient, she believes her INRs were in range when she was getting about 3mg  of warfarin a day.   Heparin 0902 @ 0045 HL = 0.3, therapeutic at  low end 0902 @ 0839 HL: 0.33. rate increase to 1250 units/hr 0902 @ 1606 HL: 0.22  0902 @ 2252 HL = 0.32, therapeutic at low end on 1350 units/hr 0903 @ 0459 HL = 0.29, subtherapeutic, slightly below goal   Warfarin  DATE INR DOSE 9/1 1.0 3mg  9/2 1.0 4 mg 9/3 1.1    Goal of Therapy:  INR: 2-3 Heparin level 0.3-0.7 units/ml Monitor platelets by anticoagulation protocol: Yes   Plan:  HEPARIN: Heparin level slightly below therapeutic goal. Will increase infusion to 1450 units/hr.  Recheck HL 6 hours after rate increase  HL and CBC daily while on heparin protocol  WARFARIN: Will order Warfarin 7.5 mg PO x 1 dose today per discussion with MD. INR order with AM labs.  Pharmacy will continue to follow and order dose based on INR.   Noralee Space, PharmD Clinical Pharmacist 12/23/2018 7:49 AM

## 2018-12-23 NOTE — Progress Notes (Signed)
[name] to be D/C'd Home per MD order.  Discussed prescriptions and follow up appointments with the patient. Prescriptions given to patient, medication list explained in detail. Pt verbalized understanding.  Allergies as of 12/23/2018   No Known Allergies     Medication List    STOP taking these medications   nicotine 14 mg/24hr patch Commonly known as: Nicoderm CQ   oxyCODONE 5 MG immediate release tablet Commonly known as: Oxy IR/ROXICODONE   warfarin 1 MG tablet Commonly known as: COUMADIN   warfarin 5 MG tablet Commonly known as: COUMADIN     TAKE these medications   albuterol 108 (90 Base) MCG/ACT inhaler Commonly known as: VENTOLIN HFA Inhale 2 puffs into the lungs every 6 (six) hours as needed for wheezing or shortness of breath.   apixaban 5 MG Tabs tablet Commonly known as: ELIQUIS Take 2 tablets (10 mg total) by mouth 2 (two) times daily. And then 5 mg bid starting 12/31/2018       Vitals:   12/23/18 0447 12/23/18 1137  BP: 131/69 128/80  Pulse: (!) 57 62  Resp: 16   Temp: 98.3 F (36.8 C) 97.8 F (36.6 C)  SpO2: 98% 99%    Skin clean, dry and intact without evidence of skin break down, no evidence of skin tears noted. IV catheter discontinued intact. Site without signs and symptoms of complications. Dressing and pressure applied. Pt denies pain at this time. No complaints noted.  An After Visit Summary was printed and given to the patient. Patient escorted via [place], and D/C home via private auto.  Fuller Mandril, RN

## 2018-12-23 NOTE — Progress Notes (Signed)
[place] for Heparin and Warfarin  Indication: aortic clot  No Known Allergies  Patient Measurements: Height: 5\' 7"  (170.2 cm) Weight: 141 lb 9.6 oz (64.2 kg) IBW/kg (Calculated) : 61.6 Heparin Dosing Weight:  68 kg   Vital Signs: Temp: 98.3 F (36.8 C) (09/03 0447) Temp Source: Oral (09/03 0447) BP: 131/69 (09/03 0447) Pulse Rate: 57 (09/03 0447)  Labs: Recent Labs    12/21/18 1501  12/22/18 0045 12/22/18 0839 12/22/18 1606 12/22/18 2252 12/23/18 0459  HGB 13.9  --  13.3 13.0  --   --  13.2  HCT 42.2  --  41.0 39.7  --   --  40.9  PLT 500*  --  522* 483*  --   --  547*  APTT 30  --   --   --   --   --   --   LABPROT 13.5  --   --  13.4  --   --  13.7  INR 1.0  --   --  1.0  --   --  1.1  HEPARINUNFRC  --    < > 0.30 0.33 0.22* 0.32 0.29*  CREATININE 0.53  --   --  0.60  --   --   --    < > = values in this interval not displayed.    Estimated Creatinine Clearance: 90 mL/min (by C-G formula based on SCr of 0.6 mg/dL).   Medical History: Past Medical History:  Diagnosis Date  . Aortic thrombus ([place])   . 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 warfarin (COUMADIN) 1 MG tablet Take 1 tablet (1 mg total) by mouth daily. 30 tablet 0 12/20/2018 at 1800  . albuterol (PROVENTIL HFA;VENTOLIN HFA) 108 (90 Base) MCG/ACT inhaler Inhale 2 puffs into the lungs every 6 (six) hours as needed for wheezing or shortness of breath. (Patient not taking: Reported on 12/21/2018) 1 Inhaler 2 Not Taking at Unknown time  . nicotine (NICODERM CQ) 14 mg/24hr patch Place 1 patch (14 mg total) onto the skin daily. (Patient not taking: Reported on 12/21/2018) 28 patch 0 Not Taking at Unknown time  . oxyCODONE (OXY IR/ROXICODONE) 5 MG immediate release tablet Take 1 tablet (5 mg total) by mouth every 8 (eight) hours as needed for severe pain. (Patient not taking: Reported on  02/15/2016) 90 tablet 0   . warfarin (COUMADIN) 1 MG tablet TAKE 3 TABLETS BY MOUTH DAILY (Patient not taking: Reported on 12/21/2018) 90 tablet 0 Not Taking at Unknown time  . warfarin (COUMADIN) 1 MG tablet TAKE 1 TABLET BY MOUTH EVERY DAY (Patient not taking: Reported on 12/21/2018) 30 tablet 0 Not Taking at du  . warfarin (COUMADIN) 5 MG tablet TAKE 1 TABLET(5 MG) BY MOUTH DAILY (Patient not taking: Reported on 12/21/2018) 30 tablet 6 Not Taking at Unknown time    Assessment: Pharmacy consulted to dose heparin and warfarin in 43 year old female admitted with aortic clot.  Pt was prescribed warfarin PTA, but has not been compliant with getting INR checks due to not having insurance.   Last PTA Warfarin dose in chart is 1mg  daily. Patient only on this dose because she was unable to get her INR checked regular. Per patient, she believes her INRs were in range when she was getting about 3mg  of warfarin a day.   Heparin 0902 @ 0045 HL = 0.3, therapeutic at  low end 0902 @ 0839 HL: 0.33. rate increase to 1250 units/hr 0902 @ 1606 HL: 0.22  0902 @ 2252 HL = 0.32, therapeutic at low end on 1350 units/hr 0903 @ 0459 HL = 0.29, subtherapeutic, slightly below goal   Warfarin  DATE INR DOSE 9/1 1.0 3mg  9/2 1.0    Goal of Therapy:  INR: 2-3 Heparin level 0.3-0.7 units/ml Monitor platelets by anticoagulation protocol: Yes   Plan:  HEPARIN: Heparin level slightly below therapeutic goal. Will increase infusion to 1450 units/hr.  Recheck HL 6 hours after rate increase  HL and CBC daily while on heparin protocol  WARFARIN: Will order Warfarin 4mg  PO x 1 dose tonight.  INR order with AM labs.  Pharmacy will continue to follow and order dose based on INR.   Ena Dawley, PharmD Clinical Pharmacist 12/23/2018 5:50 AM

## 2018-12-23 NOTE — Discharge Summary (Signed)
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 Aortic thrombus ([place])   . 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 She started having lower back pain last Friday and came to emergency room and went home and then continued to have lower back pain and claudication with pain in both legs and came to emergency room today again  *Recurrent Aortic thrombus,bilateral renal infarct,and known h/odeep vein thrombosis Subtherapeutic INR with pt being on warfarin but due to financial issues she was unable to get INR checked routinely--d/w Dr Lucky Cowboy ok to start po eliquis -pt to fill out financial assistance form for Eliquis--CSW to guide her and then hoping she will get her meds lifelong -was on heparin IV drip--d/c after pt takes eliquis -Vascular consult with Dr Lucky Cowboy-- ok to d/c home and f/u Dr dew with repeat CT in 2 weeks -pharmacy to dose eliquis  *Active smoking Counseled to quit smoking for 4 minutes and offered nicotine patch.  *Hypokalemia Repleted.  D/c home today  CONSULTS OBTAINED:  Treatment Team:  Algernon Huxley, MD  DRUG ALLERGIES:  No Known Allergies  DISCHARGE MEDICATIONS:   Allergies as of 12/23/2018   No Known Allergies     Medication List    STOP taking these medications   nicotine 14 mg/24hr patch Commonly known as: Nicoderm CQ   oxyCODONE 5 MG  immediate release tablet Commonly known as: Oxy IR/ROXICODONE   warfarin 1 MG tablet Commonly known as: COUMADIN   warfarin 5 MG tablet Commonly known as: COUMADIN     TAKE these medications   albuterol 108 (90 Base) MCG/ACT inhaler Commonly known as: VENTOLIN HFA Inhale 2 puffs into the lungs every 6 (six) hours as needed for wheezing or shortness of breath.   apixaban 5 MG Tabs tablet Commonly known as: ELIQUIS Take 2 tablets (10 mg total) by mouth 2 (two) times daily. And then 5 mg bid starting 12/31/2018       If you experience worsening of your admission symptoms, develop shortness of breath, life threatening emergency, suicidal or homicidal thoughts you must seek medical attention immediately by calling 911 or calling your MD immediately  if symptoms less severe.  You Must read complete instructions/literature along with all the possible adverse reactions/side effects for all the Medicines you take and that have been prescribed to you. Take any new Medicines after you have completely understood and accept all the possible adverse reactions/side effects.   Please note  You were cared for by a hospitalist during your hospital stay. If you have any questions about your discharge medications or the care you received while you were in the hospital after you are discharged, you can call the unit and asked to speak with the hospitalist on call if the hospitalist that took care of you is not available. Once you are discharged, your primary care physician will handle any further medical issues. Please note that  NO REFILLS for any discharge medications will be authorized once you are discharged, as it is imperative that you return to your primary care physician (or establish a relationship with a primary care physician if you do not have one) for your aftercare needs so that they can reassess your need for medications and monitor your lab values. Today   SUBJECTIVE   Feels better Wants  to go home and celebrate her birthday with her children  VITAL SIGNS:  Blood pressure 131/69, pulse (!) 57, temperature 98.3 F (36.8 C), temperature source Oral, resp. rate 16, height 5\' 7"  (1.702 m), weight 64.2 kg, last menstrual period 11/26/2018, SpO2 98 %.  I/O:    Intake/Output Summary (Last 24 hours) at 12/23/2018 1036 Last data filed at 12/22/2018 1816 Gross per 24 hour  Intake 522.07 ml  Output -  Net 522.07 ml    PHYSICAL EXAMINATION:  GENERAL:  43 y.o.-year-old patient lying in the bed with no acute distress.  EYES: Pupils equal, round, reactive to light and accommodation. No scleral icterus. Extraocular muscles intact.  HEENT: Head atraumatic, normocephalic. Oropharynx and nasopharynx clear.  NECK:  Supple, no jugular venous distention. No thyroid enlargement, no tenderness.  LUNGS: Normal breath sounds bilaterally, no wheezing, rales,rhonchi or crepitation. No use of accessory muscles of respiration.  CARDIOVASCULAR: S1, S2 normal. No murmurs, rubs, or gallops.  ABDOMEN: Soft, non-tender, non-distended. Bowel sounds present. No organomegaly or mass.  EXTREMITIES: No pedal edema, cyanosis, or clubbing.  NEUROLOGIC: Cranial nerves II through XII are intact. Muscle strength 5/5 in all extremities. Sensation intact. Gait not checked.  PSYCHIATRIC: The patient is alert and oriented x 3.  SKIN: No obvious rash, lesion, or ulcer.   DATA REVIEW:   CBC  Recent Labs  Lab 12/23/18 0459  WBC 10.2  HGB 13.2  HCT 40.9  PLT 547*    Chemistries  Recent Labs  Lab 12/17/18 2308  12/22/18 0839  NA 134*   < > 139  K 3.5   < > 3.7  CL 102   < > 106  CO2 21*   < > 24  GLUCOSE 108*   < > 104*  BUN <5*   < > <5*  CREATININE 0.64   < > 0.60  CALCIUM 8.5*   < > 8.4*  AST 33  --   --   ALT 23  --   --   ALKPHOS 103  --   --   BILITOT 0.6  --   --    < > = values in this interval not displayed.    Microbiology Results   Recent Results (from the past 240 hour(s))  SARS  Coronavirus 2 [facility] order, Performed in [facility] hospital lab) Nasopharyngeal Nasopharyngeal Swab     Status: None   Collection Time: 12/21/18  6:07 PM   Specimen: Nasopharyngeal Swab  Result Value Ref Range Status   SARS Coronavirus 2 NEGATIVE NEGATIVE Final    Comment: (NOTE) If result is NEGATIVE SARS-CoV-2 target nucleic acids are NOT DETECTED. The SARS-CoV-2 RNA is generally detectable in upper and lower  respiratory specimens during the acute phase of infection. The lowest  concentration of SARS-CoV-2 viral copies this assay can detect is 250  copies / mL. A negative result does not preclude SARS-CoV-2 infection  and should not be used as the sole basis for treatment or other  patient management decisions.  A negative result may occur with  improper specimen collection / handling, submission of specimen  other  than nasopharyngeal swab, presence of viral mutation(s) within the  areas targeted by this assay, and inadequate number of viral copies  (<250 copies / mL). A negative result must be combined with clinical  observations, patient history, and epidemiological information. If result is POSITIVE SARS-CoV-2 target nucleic acids are DETECTED. The SARS-CoV-2 RNA is generally detectable in upper and lower  respiratory specimens dur ing the acute phase of infection.  Positive  results are indicative of active infection with SARS-CoV-2.  Clinical  correlation with patient history and other diagnostic information is  necessary to determine patient infection status.  Positive results do  not rule out bacterial infection or co-infection with other viruses. If result is PRESUMPTIVE POSTIVE SARS-CoV-2 nucleic acids MAY BE PRESENT.   A presumptive positive result was obtained on the submitted specimen  and confirmed on repeat testing.  While 2019 novel coronavirus  (SARS-CoV-2) nucleic acids may be present in the submitted sample  additional confirmatory testing may be necessary  for epidemiological  and / or clinical management purposes  to differentiate between  SARS-CoV-2 and other Sarbecovirus currently known to infect humans.  If clinically indicated additional testing with an alternate test  methodology [phone]) is advised. The SARS-CoV-2 RNA is generally  detectable in upper and lower respiratory sp ecimens during the acute  phase of infection. The expected result is Negative. Fact Sheet for Patients:  StrictlyIdeas.no Fact Sheet for Healthcare Providers: BankingDealers.co.za This test is not yet approved or cleared by the [place] FDA and has been authorized for detection and/or diagnosis of SARS-CoV-2 by FDA under an Emergency Use Authorization (EUA).  This EUA will remain in effect (meaning this test can be used) for the duration of the COVID-19 declaration under Section 564(b)(1) of the Act, 21 U.S.C. section 360bbb-3(b)(1), unless the authorization is terminated or revoked sooner. Performed at [facility], [address] [place],  [address]     RADIOLOGY:  Ct Angio Chest/abd/pel For Dissection W And/or Wo Contrast  Result Date: 12/21/2018 CLINICAL DATA:  Chest pain and severe left upper quadrant pain with nausea and vomiting. Severe bilateral lower extremity pain. Tingling and numbness. History of aortic thrombus in September 2016. EXAM: CT ANGIOGRAPHY CHEST, ABDOMEN AND PELVIS TECHNIQUE: Multidetector CT imaging through the chest, abdomen and pelvis was performed using the standard protocol during bolus administration of intravenous contrast. Multiplanar reconstructed images and MIPs were obtained and reviewed to evaluate the vascular anatomy. CONTRAST:  13mL OMNIPAQUE IOHEXOL 350 MG/ML SOLN COMPARISON:  CT angiograms dated 11/21/2016, 07/19/2015 and 01/18/2015 and 01/16/2015 FINDINGS: CTA CHEST FINDINGS Cardiovascular: The patient has developed recurrent thrombus in the anterior  aspect of the distal thoracic aorta. Heart appears normal. No pericardial effusion. No appreciable pulmonary emboli. Mediastinum/Nodes: No enlarged mediastinal, hilar, or axillary lymph nodes. Thyroid gland, trachea, and esophagus demonstrate no significant findings. 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 The lungs are otherwise clear. No effusions. Musculoskeletal: No chest wall abnormality. No acute or significant osseous findings. Review of the MIP images confirms the above findings. CTA ABDOMEN AND PELVIS FINDINGS VASCULAR Aorta: There is new thrombus in the anterior aspect of the abdominal aorta just below the level of the renal arteries there is calcification and aorta just above the aortic bifurcation. Celiac: Patent without evidence of aneurysm, dissection, vasculitis or significant stenosis. SMA: Patent without evidence of aneurysm, dissection, vasculitis or significant stenosis. Renals: Single renal arteries bilaterally. Multiple patchy areas of parenchymal  infarction in the left kidney. IMA: Patent without evidence of aneurysm, dissection, vasculitis or significant stenosis. Inflow: Thrombus in the descending thoracic aorta as described above. Veins: There is haziness surrounding the left common femoral vein in the left groin which suspect represents venous thrombosis. Arteries of the proximal thighs: There is a nonocclusive thrombus in the proximal right profunda femoris artery. 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 These are new since the prior exams. Review of the MIP images confirms the above findings. NON-VASCULAR Hepatobiliary: No focal liver abnormality is seen. No gallstones, gallbladder wall thickening, or biliary dilatation. Pancreas: Unremarkable. No pancreatic ductal dilatation or surrounding inflammatory changes. Spleen: The spleen is irregular and slightly smaller than on  prior studies consistent with the history of prior splenic infarcts. No discrete acute splenic infarct. Adrenals/Urinary Tract: Multiple new areas of low-density parenchyma of left kidney consistent with acute renal infarcts. Right kidney appears normal. Adrenal glands are normal. No hydronephrosis. Bladder is normal. Stomach/Bowel: Stomach is within normal limits. Appendix appears normal. No evidence of bowel wall thickening, distention, or inflammatory changes. Lymphatic: No adenopathy. Reproductive: 17 mm partially collapsed cyst on the left ovary. Right ovary and uterus appear normal. Other: No abdominal wall hernia or abnormality. No abdominopelvic ascites. Musculoskeletal: No acute abnormality. Moderate arthritic changes of the right acetabulum. Slight arthritic changes left acetabulum. Review of the MIP images confirms the above findings. IMPRESSION: 1. 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 2. Multiple new small renal infarctions. 3. 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 4. Nonocclusive thrombus in the proximal right profunda femoris artery. 5. Probable thrombus of the left common femoral vein extending distally into the left thigh. Critical Value/emergent results were called by telephone at the time of interpretation on 12/21/2018 at 5:23 pm to Dr. Duffy Bruce , who verbally acknowledged these results. Electronically Signed   By: Lorriane Shire M.D.   On: 12/21/2018 17:43     CODE STATUS:     Code Status Orders  (From admission, onward)         Start     Ordered   12/21/18 2253  Full code  Continuous     12/21/18 2252        Code Status History    Date Active Date Inactive Code Status Order ID Comments User Context   01/15/2015 2112 01/18/2015 1652 Full Code [id number]  Hower, Aaron Mose, MD ED   Advance Care Planning Activity      TOTAL TIME TAKING CARE OF THIS PATIENT: *40* minutes.    Fritzi Mandes M.D on 12/23/2018  at 10:36 AM  Between 7am to 6pm - Pager - [phone] After 6pm go to [email or web address] - password [email or web address] [place] [facility]  Office  [phone]  CC: Primary care physician; System, Pcp Not In

## 2019-03-07 ENCOUNTER — Telehealth (INDEPENDENT_AMBULATORY_CARE_PROVIDER_SITE_OTHER): Payer: Self-pay

## 2019-03-07 NOTE — Telephone Encounter (Signed)
Patient left a message stating she was release from the hospital in September for DVT and she has been dealing with pain management for medication. 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 The patient has contacted PCP and cannot be seen until the end of next week. The patient stated she do not know what to do, she doesn't feel good, and she can tell that she hasn't taking the medicine. She stated she do not want to end up with another DVT and would like to can our office can call in a prescription. I spoke with Eulogio Ditch NP and she informed that she can call a 15 day prescription and from that point she will have to get her medication from who originally prescribe the medication. The patient has been made aware and verbalized understanding.

## 2019-12-21 DIAGNOSIS — G9050 Complex regional pain syndrome I, unspecified: Secondary | ICD-10-CM

## 2019-12-21 HISTORY — DX: Complex regional pain syndrome I, unspecified: G90.50

## 2020-01-16 DIAGNOSIS — S63501A Unspecified sprain of right wrist, initial encounter: Secondary | ICD-10-CM | POA: Insufficient documentation

## 2020-03-09 ENCOUNTER — Other Ambulatory Visit: Payer: Self-pay

## 2020-03-09 ENCOUNTER — Inpatient Hospital Stay
Admission: EM | Admit: 2020-03-09 | Discharge: 2020-03-12 | DRG: 271 | Disposition: A | Discharge status: Home / Self Care | Payer: Medicaid Other | Attending: Internal Medicine | Admitting: Internal Medicine

## 2020-03-09 ENCOUNTER — Inpatient Hospital Stay: Payer: Medicaid Other | Admitting: Certified Registered"

## 2020-03-09 ENCOUNTER — Encounter
Admission: EM | Disposition: A | Discharge status: Home / Self Care | Payer: Self-pay | Source: Home / Self Care | Attending: Internal Medicine

## 2020-03-09 ENCOUNTER — Emergency Department: Payer: Medicaid Other

## 2020-03-09 ENCOUNTER — Encounter: Payer: Self-pay | Admitting: Radiology

## 2020-03-09 DIAGNOSIS — F17200 Nicotine dependence, unspecified, uncomplicated: Secondary | ICD-10-CM | POA: Diagnosis present

## 2020-03-09 DIAGNOSIS — Z8249 Family history of ischemic heart disease and other diseases of the circulatory system: Secondary | ICD-10-CM | POA: Diagnosis not present

## 2020-03-09 DIAGNOSIS — I742 Embolism and thrombosis of arteries of the upper extremities: Secondary | ICD-10-CM | POA: Diagnosis present

## 2020-03-09 DIAGNOSIS — I70228 Atherosclerosis of native arteries of extremities with rest pain, other extremity: Principal | ICD-10-CM | POA: Diagnosis present

## 2020-03-09 DIAGNOSIS — I748 Embolism and thrombosis of other arteries: Secondary | ICD-10-CM | POA: Diagnosis present

## 2020-03-09 DIAGNOSIS — D72829 Elevated white blood cell count, unspecified: Secondary | ICD-10-CM | POA: Diagnosis present

## 2020-03-09 DIAGNOSIS — E785 Hyperlipidemia, unspecified: Secondary | ICD-10-CM | POA: Diagnosis present

## 2020-03-09 DIAGNOSIS — D6859 Other primary thrombophilia: Secondary | ICD-10-CM | POA: Diagnosis present

## 2020-03-09 DIAGNOSIS — I749 Embolism and thrombosis of unspecified artery: Secondary | ICD-10-CM | POA: Diagnosis not present

## 2020-03-09 DIAGNOSIS — Z9114 Patient's other noncompliance with medication regimen: Secondary | ICD-10-CM | POA: Diagnosis not present

## 2020-03-09 DIAGNOSIS — I160 Hypertensive urgency: Secondary | ICD-10-CM | POA: Diagnosis present

## 2020-03-09 DIAGNOSIS — I10 Essential (primary) hypertension: Secondary | ICD-10-CM | POA: Diagnosis present

## 2020-03-09 DIAGNOSIS — I7409 Other arterial embolism and thrombosis of abdominal aorta: Secondary | ICD-10-CM | POA: Diagnosis present

## 2020-03-09 DIAGNOSIS — Z7901 Long term (current) use of anticoagulants: Secondary | ICD-10-CM | POA: Diagnosis not present

## 2020-03-09 DIAGNOSIS — D735 Infarction of spleen: Secondary | ICD-10-CM | POA: Diagnosis not present

## 2020-03-09 DIAGNOSIS — I70208 Unspecified atherosclerosis of native arteries of extremities, other extremity: Principal | ICD-10-CM

## 2020-03-09 DIAGNOSIS — I7411 Embolism and thrombosis of thoracic aorta: Secondary | ICD-10-CM | POA: Diagnosis present

## 2020-03-09 DIAGNOSIS — F17210 Nicotine dependence, cigarettes, uncomplicated: Secondary | ICD-10-CM | POA: Diagnosis present

## 2020-03-09 DIAGNOSIS — Z86718 Personal history of other venous thrombosis and embolism: Secondary | ICD-10-CM | POA: Diagnosis not present

## 2020-03-09 DIAGNOSIS — M79641 Pain in right hand: Secondary | ICD-10-CM

## 2020-03-09 DIAGNOSIS — N280 Ischemia and infarction of kidney: Secondary | ICD-10-CM | POA: Diagnosis present

## 2020-03-09 DIAGNOSIS — R1084 Generalized abdominal pain: Secondary | ICD-10-CM

## 2020-03-09 DIAGNOSIS — Z20822 Contact with and (suspected) exposure to covid-19: Secondary | ICD-10-CM | POA: Diagnosis present

## 2020-03-09 DIAGNOSIS — R6889 Other general symptoms and signs: Secondary | ICD-10-CM

## 2020-03-09 HISTORY — PX: EMBOLECTOMY: SHX44

## 2020-03-09 LAB — COMPREHENSIVE METABOLIC PANEL
ALT: 40 U/L (ref 0–44)
AST: 72 U/L — ABNORMAL HIGH (ref 15–41)
Albumin: 3.3 g/dL — ABNORMAL LOW (ref 3.5–5.0)
Alkaline Phosphatase: 76 U/L (ref 38–126)
Anion gap: 9 (ref 5–15)
BUN: 12 mg/dL (ref 6–20)
CO2: 24 mmol/L (ref 22–32)
Calcium: 8.7 mg/dL — ABNORMAL LOW (ref 8.9–10.3)
Chloride: 100 mmol/L (ref 98–111)
Creatinine, Ser: 0.88 mg/dL (ref 0.44–1.00)
GFR, Estimated: >60 mL/min (ref >60)
Glucose, Bld: 120 mg/dL — ABNORMAL HIGH (ref 70–99)
Potassium: 3.9 mmol/L (ref 3.5–5.1)
Sodium: 133 mmol/L — ABNORMAL LOW (ref 135–145)
Total Bilirubin: 0.9 mg/dL (ref 0.3–1.2)
Total Protein: 7.2 g/dL (ref 6.5–8.1)

## 2020-03-09 LAB — URINALYSIS, COMPLETE (UACMP) WITH MICROSCOPIC
Bacteria, UA: NONE SEEN
Bilirubin Urine: NEGATIVE
Glucose, UA: NEGATIVE mg/dL
Ketones, ur: 5 mg/dL — AB
Leukocytes,Ua: NEGATIVE
Nitrite: NEGATIVE
Protein, ur: 100 mg/dL — AB
Specific Gravity, Urine: 1.014 (ref 1.005–1.030)
pH: 6 (ref 5.0–8.0)

## 2020-03-09 LAB — CBC
HCT: 46.7 % — ABNORMAL HIGH (ref 36.0–46.0)
Hemoglobin: 15.4 g/dL — ABNORMAL HIGH (ref 12.0–15.0)
MCH: 26.7 pg (ref 26.0–34.0)
MCHC: 33 g/dL (ref 30.0–36.0)
MCV: 80.9 fL (ref 80.0–100.0)
Platelets: 342 10*3/uL (ref 150–400)
RBC: 5.77 MIL/uL — ABNORMAL HIGH (ref 3.87–5.11)
RDW: 19.4 % — ABNORMAL HIGH (ref 11.5–15.5)
WBC: 22.9 10*3/uL — ABNORMAL HIGH (ref 4.0–10.5)
nRBC: 0 % (ref 0.0–0.2)

## 2020-03-09 LAB — RESP PANEL BY RT-PCR (FLU A&B, COVID) ARPGX2
Influenza A by PCR: NEGATIVE
Influenza B by PCR: NEGATIVE
SARS Coronavirus 2 by RT PCR: NEGATIVE

## 2020-03-09 LAB — APTT: aPTT: 28 seconds (ref 24–36)

## 2020-03-09 LAB — LIPASE, BLOOD: Lipase: 20 U/L (ref 11–51)

## 2020-03-09 LAB — POC URINE PREG, ED: Preg Test, Ur: NEGATIVE

## 2020-03-09 LAB — PROTIME-INR
INR: 1.1 (ref 0.8–1.2)
Prothrombin Time: 13.8 seconds (ref 11.4–15.2)

## 2020-03-09 SURGERY — EMBOLECTOMY
Anesthesia: General | Site: Arm Upper | Laterality: Right

## 2020-03-09 SURGERY — EMBOLECTOMY
Anesthesia: General | Laterality: Right

## 2020-03-09 MED ORDER — SUCCINYLCHOLINE CHLORIDE 20 MG/ML IJ SOLN
INTRAMUSCULAR | Status: DC | PRN
  Administered 2020-03-09: 80 mg via INTRAVENOUS

## 2020-03-09 MED ORDER — SODIUM CHLORIDE 0.9 % IV SOLN: INTRAVENOUS | Status: DC

## 2020-03-09 MED ORDER — ONDANSETRON HCL 4 MG/2ML IJ SOLN
4.0000 mg | Freq: Four times a day (QID) | INTRAMUSCULAR | Status: DC | PRN
  Administered 2020-03-10: 4 mg via INTRAVENOUS
  Filled 2020-03-09: qty 2

## 2020-03-09 MED ORDER — PROPOFOL 10 MG/ML IV BOLUS
INTRAVENOUS | Status: DC | PRN
  Administered 2020-03-09: 120 mg via INTRAVENOUS

## 2020-03-09 MED ORDER — MIDAZOLAM HCL 2 MG/2ML IJ SOLN
INTRAMUSCULAR | Status: AC
  Filled 2020-03-09: qty 2

## 2020-03-09 MED ORDER — ONDANSETRON HCL 4 MG/2ML IJ SOLN
INTRAMUSCULAR | Status: DC | PRN
  Administered 2020-03-09: 4 mg via INTRAVENOUS

## 2020-03-09 MED ORDER — HEPARIN (PORCINE) 25000 UT/250ML-% IV SOLN
1400.0000 [IU]/h | INTRAVENOUS | Status: DC
  Administered 2020-03-09: 1400 [IU]/h via INTRAVENOUS
  Filled 2020-03-09: qty 250

## 2020-03-09 MED ORDER — ONDANSETRON HCL 4 MG/2ML IJ SOLN
4.0000 mg | Freq: Once | INTRAMUSCULAR | Status: AC
  Administered 2020-03-09: 4 mg via INTRAVENOUS
  Filled 2020-03-09: qty 2

## 2020-03-09 MED ORDER — CEFAZOLIN SODIUM-DEXTROSE 2-4 GM/100ML-% IV SOLN
2.0000 g | INTRAVENOUS | Status: AC
  Administered 2020-03-09: 2 g via INTRAVENOUS

## 2020-03-09 MED ORDER — FENTANYL CITRATE (PF) 100 MCG/2ML IJ SOLN
INTRAMUSCULAR | Status: DC | PRN
  Administered 2020-03-09: 100 ug via INTRAVENOUS
  Administered 2020-03-10 (×2): 50 ug via INTRAVENOUS

## 2020-03-09 MED ORDER — HEPARIN BOLUS VIA INFUSION
4800.0000 [IU] | Freq: Once | INTRAVENOUS | Status: AC
  Administered 2020-03-09: 4800 [IU] via INTRAVENOUS
  Filled 2020-03-09: qty 4800

## 2020-03-09 MED ORDER — MIDAZOLAM HCL 2 MG/2ML IJ SOLN
INTRAMUSCULAR | Status: DC | PRN
  Administered 2020-03-09: 2 mg via INTRAVENOUS

## 2020-03-09 MED ORDER — ONDANSETRON HCL 4 MG PO TABS: 4.0000 mg | ORAL_TABLET | Freq: Four times a day (QID) | ORAL | Status: DC | PRN

## 2020-03-09 MED ORDER — HEPARIN SODIUM (PORCINE) 5000 UNIT/ML IJ SOLN: 60.0000 [IU]/kg | Freq: Once | INTRAMUSCULAR | Status: DC

## 2020-03-09 MED ORDER — LACTATED RINGERS IV SOLN: INTRAVENOUS | Status: DC | PRN

## 2020-03-09 MED ORDER — NICOTINE 21 MG/24HR TD PT24
21.0000 mg | MEDICATED_PATCH | Freq: Every day | TRANSDERMAL | Status: DC
  Administered 2020-03-10 – 2020-03-12 (×3): 21 mg via TRANSDERMAL
  Filled 2020-03-09 (×3): qty 1

## 2020-03-09 MED ORDER — IOHEXOL 350 MG/ML SOLN
100.0000 mL | Freq: Once | INTRAVENOUS | Status: AC | PRN
  Administered 2020-03-10: 100 mL via INTRAVENOUS

## 2020-03-09 MED ORDER — HYDROMORPHONE HCL 1 MG/ML IJ SOLN
1.0000 mg | INTRAMUSCULAR | Status: DC | PRN
  Administered 2020-03-10 – 2020-03-12 (×10): 1 mg via INTRAVENOUS
  Filled 2020-03-09 (×10): qty 1

## 2020-03-09 MED ORDER — PHENYLEPHRINE HCL-NACL 10-0.9 MG/250ML-% IV SOLN
INTRAVENOUS | Status: DC | PRN
  Administered 2020-03-09: 50 ug/min via INTRAVENOUS

## 2020-03-09 MED ORDER — FENTANYL CITRATE (PF) 100 MCG/2ML IJ SOLN
INTRAMUSCULAR | Status: AC
  Filled 2020-03-09: qty 2

## 2020-03-09 MED ORDER — PHENYLEPHRINE HCL (PRESSORS) 10 MG/ML IV SOLN
INTRAVENOUS | Status: DC | PRN
  Administered 2020-03-09 (×3): 100 ug via INTRAVENOUS

## 2020-03-09 MED ORDER — SODIUM CHLORIDE 0.45 % IV SOLN: INTRAVENOUS | Status: DC

## 2020-03-09 MED ORDER — DEXAMETHASONE SODIUM PHOSPHATE 10 MG/ML IJ SOLN
INTRAMUSCULAR | Status: DC | PRN
  Administered 2020-03-09: 5 mg via INTRAVENOUS

## 2020-03-09 MED ORDER — HEPARIN SODIUM (PORCINE) 5000 UNIT/ML IJ SOLN
INTRAMUSCULAR | Status: AC
  Filled 2020-03-09: qty 1

## 2020-03-09 MED ORDER — PROPOFOL 10 MG/ML IV BOLUS
INTRAVENOUS | Status: AC
  Filled 2020-03-09: qty 40

## 2020-03-09 MED ORDER — MORPHINE SULFATE (PF) 4 MG/ML IV SOLN
4.0000 mg | Freq: Once | INTRAVENOUS | Status: AC
  Administered 2020-03-09: 4 mg via INTRAVENOUS
  Filled 2020-03-09: qty 1

## 2020-03-09 MED ORDER — LIDOCAINE HCL (CARDIAC) PF 100 MG/5ML IV SOSY
PREFILLED_SYRINGE | INTRAVENOUS | Status: DC | PRN
  Administered 2020-03-09: 60 mg via INTRAVENOUS

## 2020-03-09 MED ORDER — LACTATED RINGERS IV BOLUS
1000.0000 mL | Freq: Once | INTRAVENOUS | Status: AC
  Administered 2020-03-09: 1000 mL via INTRAVENOUS

## 2020-03-09 SURGICAL SUPPLY — 36 items
BLADE SURG SZ11 CARB STEEL (BLADE) ×3 IMPLANT
BRUSH SCRUB EZ  4% CHG (MISCELLANEOUS) ×2
BRUSH SCRUB EZ 4% CHG (MISCELLANEOUS) ×1 IMPLANT
CATH EMBOLECTOMY 2X60 (CATHETERS) ×3 IMPLANT
CATH EMBOLECTOMY 3X80 (CATHETERS) ×3 IMPLANT
CLIP SPRNG 6MM S-JAW DBL (CLIP) ×3
DERMABOND ADVANCED (GAUZE/BANDAGES/DRESSINGS) ×2
DERMABOND ADVANCED .7 DNX12 (GAUZE/BANDAGES/DRESSINGS) ×1 IMPLANT
ELECT CAUTERY BLADE 6.4 (BLADE) ×3 IMPLANT
ELECT REM PT RETURN 9FT ADLT (ELECTROSURGICAL) ×3
ELECTRODE REM PT RTRN 9FT ADLT (ELECTROSURGICAL) ×1 IMPLANT
GLOVE BIO SURGEON STRL SZ7 (GLOVE) ×6 IMPLANT
GLOVE INDICATOR 7.5 STRL GRN (GLOVE) ×6 IMPLANT
GOWN STRL REUS W/ TWL LRG LVL3 (GOWN DISPOSABLE) ×2 IMPLANT
GOWN STRL REUS W/ TWL XL LVL3 (GOWN DISPOSABLE) ×2 IMPLANT
GOWN STRL REUS W/TWL LRG LVL3 (GOWN DISPOSABLE) ×4
GOWN STRL REUS W/TWL XL LVL3 (GOWN DISPOSABLE) ×4
HEMOSTAT SURGICEL 2X3 (HEMOSTASIS) ×3 IMPLANT
NEEDLE FILTER BLUNT 18X 1/2SAF (NEEDLE) ×2
NEEDLE FILTER BLUNT 18X1 1/2 (NEEDLE) ×1 IMPLANT
NEEDLE HYPO 25X1 1.5 SAFETY (NEEDLE) ×3 IMPLANT
NS IRRIG 500ML POUR BTL (IV SOLUTION) ×6 IMPLANT
PACK EXTREMITY (MISCELLANEOUS) ×3 IMPLANT
SOLUTION CELL SAVER (CLIP) ×1 IMPLANT
SUT MNCRL AB 4-0 PS2 18 (SUTURE) ×3 IMPLANT
SUT PROLENE 6 0 BV (SUTURE) ×9 IMPLANT
SUT SILK 2 0 (SUTURE) ×2
SUT SILK 2-0 18XBRD TIE 12 (SUTURE) ×1 IMPLANT
SUT SILK 3 0 (SUTURE) ×2
SUT SILK 3-0 18XBRD TIE 12 (SUTURE) ×1 IMPLANT
SUT SILK 4 0 (SUTURE) ×2
SUT SILK 4-0 18XBRD TIE 12 (SUTURE) ×1 IMPLANT
SUT VIC AB 3-0 SH 27 (SUTURE) ×2
SUT VIC AB 3-0 SH 27X BRD (SUTURE) ×1 IMPLANT
SYR 20ML LL LF (SYRINGE) ×3 IMPLANT
SYR 3ML LL SCALE MARK (SYRINGE) ×3 IMPLANT

## 2020-03-09 NOTE — Anesthesia Preprocedure Evaluation (Signed)
Anesthesia Evaluation  Patient identified by MRN, date of birth, ID band Patient awake  General Assessment Comment:Pt with hx of aortic thrombus, on AC at home. Presented with cold right arm, concern for clot on imaging.  Pt says she has not eaten in 2 days, citing nausea and vomiting  Reviewed: Allergy & Precautions, NPO status , Patient's Chart, lab work & pertinent test results  History of Anesthesia Complications Negative for: history of anesthetic complications  Airway Mallampati: III  TM Distance: >3 FB Neck ROM: Full    Dental  (+) Partial Upper, Partial Lower   Pulmonary neg sleep apnea, neg COPD, Current SmokerPatient did not abstain from smoking.,    Pulmonary exam normal breath sounds clear to auscultation       Cardiovascular Exercise Tolerance: Good METS(-) hypertension+ Peripheral Vascular Disease  (-) CAD and (-) Past MI (-) dysrhythmias  Rhythm:Regular Rate:Normal - Systolic murmurs    Neuro/Psych 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 and had received steroid injections for it  Neuromuscular disease negative psych ROS   GI/Hepatic neg GERD  ,(+)     (-) substance abuse  ,   Endo/Other  neg diabetes  Renal/GU negative Renal ROS     Musculoskeletal   Abdominal   Peds  Hematology   Anesthesia Other Findings Past Medical History: No date: Aortic thrombus ([place]) 12/2014: Splenic infarct  Reproductive/Obstetrics                             Anesthesia Physical Anesthesia Plan  ASA: III and emergent  Anesthesia Plan: General   Post-op Pain Management:    Induction: Intravenous and Rapid sequence  PONV Risk Score and Plan: 2 and Ondansetron, Dexamethasone and Midazolam  Airway Management Planned: Oral ETT  Additional Equipment: None  Intra-op Plan:   Post-operative Plan: Extubation in OR  Informed Consent: I have reviewed the patients History and Physical, chart, labs and  discussed the procedure including the risks, benefits and alternatives for the proposed anesthesia with the patient or authorized representative who has indicated his/her understanding and acceptance.     Dental advisory given  Plan Discussed with: CRNA and Surgeon  Anesthesia Plan Comments: (Discussed risks of anesthesia with patient, including PONV, sore throat, lip/dental damage. Rare risks discussed as well, such as cardiorespiratory and neurological sequelae, aspiration. Patient understands. Patient counseled on benefits of smoking cessation, and increased perioperative risks associated with continued smoking.  Due to emergent nature of surgery, proceeded prior to COVID result. COVID precautions to be taken until test results.)        Anesthesia Quick Evaluation

## 2020-03-09 NOTE — Consult Note (Signed)
ANTICOAGULATION CONSULT NOTE  Pharmacy Consult for heparin infusion Indication: DVT  No Known Allergies  Patient Measurements: Height: 5\' 7"  (170.2 cm) Weight: 61.2 kg (135 lb) IBW/kg (Calculated) : 61.6  Vital Signs: Temp: 98.9 F (37.2 C) (11/19 1951) Temp Source: Oral (11/19 1951) BP: 185/104 (11/19 1951) Pulse Rate: 74 (11/19 1951)  Labs: Recent Labs    03/09/20 1956  HGB 15.4*  HCT 46.7*  PLT 342  CREATININE 0.88    Estimated Creatinine Clearance: 79.6 mL/min (by C-G formula based on SCr of 0.88 mg/dL).   Medical History: Past Medical History:  Diagnosis Date  . Aortic thrombus ([place])   . 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 femalewho presents to the ED for evaluation of abdominal pain. 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  Medical admission 1 year ago at our facility where she was transitioned to Eliquis from warfarin. Her last dose of apixaban is unable to be determined at this time. She was admitted in September 2020 and was on a heparin infusion for an aortic clot. Data from that admission was used to guide initial heparin infusion rate.  Goal of Therapy:  aPTT 66 - 102 seconds Monitor platelets by anticoagulation protocol: Yes   Plan:  Give 4800 units bolus x 1 Start heparin infusion at 1400 units/hr Check aPTT level in 6 hours and daily while on heparin Continue to monitor H&H and platelets  Dallie Piles 03/09/2020,9:00 PM

## 2020-03-09 NOTE — ED Notes (Signed)
Report given to OR RN.

## 2020-03-09 NOTE — ED Notes (Signed)
Provided pt with an ice pack for her abdomen

## 2020-03-09 NOTE — Consult Note (Signed)
[facility] Vascular Consult Note  MRN : [id number]  Suzanne [name] is a 44 y.o. (05-Jul-1975) female who presents with chief complaint of  Chief Complaint  Patient presents with  . Abdominal Pain  .  History of Present Illness: Patient presents to the emergency department with cold and painful right upper extremity and abdominal pain.  This started about 5 to 6 hours ago.  The hand became very cold, pale, and painful and numb.  The abdominal pain is mostly right mid and lower abdomen.  No shortness of breath.  No fevers or chills.  There was no trauma or injury or clear cause.  She had previously been on blood thinners for an aortic thrombus a couple of years ago, but has not taken blood thinners in about a month.  Current Facility-Administered Medications  Medication Dose Route Frequency Provider Last Rate Last Admin  . heparin ADULT infusion 100 units/mL (25000 units/236mL sodium chloride 0.45%)  1,400 Units/hr Intravenous Continuous Dallie Piles, RPH 14 mL/hr at 03/09/20 2146 1,400 Units/hr at 03/09/20 2146  . iohexol (OMNIPAQUE) 350 MG/ML injection 100 mL  100 mL Intravenous Once PRN Vladimir Crofts, MD       Current Outpatient Medications  Medication Sig Dispense Refill  . albuterol (PROVENTIL HFA;VENTOLIN HFA) 108 (90 Base) MCG/ACT inhaler Inhale 2 puffs into the lungs every 6 (six) hours as needed for wheezing or shortness of breath. (Patient not taking: Reported on 12/21/2018) 1 Inhaler 2  . apixaban (ELIQUIS) 5 MG TABS tablet Take 2 tablets (10 mg total) by mouth 2 (two) times daily. And then 5 mg bid starting 12/31/2018 60 tablet 3    Past Medical History:  Diagnosis Date  . 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 CESAREAN SECTION     times 4     Social History   Tobacco Use  . Smoking status: Current Some Day Smoker    Packs/day: 0.25    Years: 20.00    Pack years: 5.00     Types: Cigarettes  . Smokeless tobacco: Never Used  Substance Use Topics  . Alcohol use: Yes    Comment: occ  . Drug use: No     Family History  Adopted: Yes  Problem Relation Age of Onset  . Hypertension Mother   No known bleeding or clotting disorders.  No Known Allergies   REVIEW OF SYSTEMS (Negative unless checked)  Constitutional: [] Weight loss  [] Fever  [] Chills Cardiac: [] Chest pain   [] Chest pressure   [] Palpitations   [] Shortness of breath when laying flat   [] Shortness of breath at rest   [] Shortness of breath with exertion. Vascular:  [] Pain in legs with walking   [] Pain in legs at rest   [] Pain in legs when laying flat   [] Claudication   [] Pain in feet when walking  [] Pain in feet at rest  [] Pain in feet when laying flat   [] History of DVT   [x] Phlebitis   [] Swelling in legs   [] Varicose veins   [] Non-healing ulcers Pulmonary:   [] Uses home oxygen   [] Productive cough   [] Hemoptysis   [] Wheeze  [] COPD   [] Asthma Neurologic:  [] Dizziness  [] Blackouts   [] Seizures   [] History of stroke   [] History of TIA  [] Aphasia   [] Temporary blindness   [] Dysphagia   [x] Weakness or numbness in arms   [] Weakness or numbness in legs Musculoskeletal:  [] Arthritis   []   Joint swelling   [] Joint pain   [] Low back pain Hematologic:  [] Easy bruising  [] Easy bleeding   [] Hypercoagulable state   [] Anemic  [] Hepatitis Gastrointestinal:  [] Blood in stool   [] Vomiting blood  [] Gastroesophageal reflux/heartburn   [] Difficulty swallowing. X positive for abdominal pain Genitourinary:  [] Chronic kidney disease   [] Difficult urination  [] Frequent urination  [] Burning with urination   [] Blood in urine Skin:  [] Rashes   [] Ulcers   [] Wounds Psychological:  [] History of anxiety   []  History of major depression.  Physical Examination  Vitals:   03/09/20 1951 03/09/20 1952 03/09/20 2145  BP: (!) 185/104  (!) 191/106  Pulse: 74  60  Resp: 18  15  Temp: 98.9 F (37.2 C)    TempSrc: Oral    SpO2: 100%   100%  Weight:  61.2 kg   Height:  5\' 7"  (1.702 m)    Body mass index is 21.14 kg/m. Gen:  WD/WN, NAD Head: [place]/AT, No temporalis wasting.  Ear/Nose/Throat: Hearing grossly intact, nares w/o erythema or drainage, oropharynx w/o Erythema/Exudate Eyes: Sclera non-icteric, conjunctiva clear Neck: Trachea midline.  No JVD.  Pulmonary:  Good air movement, respirations not labored, equal bilaterally.  Cardiac: RRR, normal S1, S2. Vascular:  Vessel Right Left  Radial  not palpable Palpable  Ulnar  not palpable Palpable  Brachial  not palpable Palpable   Gastrointestinal: soft, non-tender/non-distended. No guarding/reflex.  Musculoskeletal: M/S 5/5 throughout.  Extremities without ischemic changes.  No deformity or atrophy. No edema. Neurologic: Sensation grossly intact in extremities.  Symmetrical.  Speech is fluent. Motor exam as listed above. Psychiatric: Judgment intact, Mood & affect appropriate for pt's clinical situation. Dermatologic: No rashes or ulcers noted.  No cellulitis or open wounds.       CBC Lab Results  Component Value Date   WBC 22.9 (H) 03/09/2020   HGB 15.4 (H) 03/09/2020   HCT 46.7 (H) 03/09/2020   MCV 80.9 03/09/2020   PLT 342 03/09/2020    BMET    Component Value Date/Time   NA 133 (L) 03/09/2020 1956   NA 140 04/23/2011 1538   K 3.9 03/09/2020 1956   K 3.9 04/23/2011 1538   CL 100 03/09/2020 1956   CL 105 04/23/2011 1538   CO2 24 03/09/2020 1956   CO2 25 04/23/2011 1538   GLUCOSE 120 (H) 03/09/2020 1956   GLUCOSE 117 (H) 04/23/2011 1538   BUN 12 03/09/2020 1956   BUN 2 (L) 04/23/2011 1538   CREATININE 0.88 03/09/2020 1956   CREATININE 0.63 04/23/2011 1538   CALCIUM 8.7 (L) 03/09/2020 1956   CALCIUM 8.9 04/23/2011 1538   GFRNONAA >60 03/09/2020 1956   GFRNONAA >60 04/23/2011 1538   GFRAA >60 12/22/2018 0839   GFRAA >60 04/23/2011 1538   Estimated Creatinine Clearance: 79.6 mL/min (by C-G formula based on SCr of 0.88 mg/dL).  COAG Lab  Results  Component Value Date   INR 1.1 03/09/2020   INR 1.1 12/23/2018   INR 1.0 12/22/2018    Radiology [place] UPPER EXTREMITY DUPLEX RIGHT (NON-WBI)  Result Date: 03/09/2020 CLINICAL DATA:  Cold painful right hand and wrist EXAM: RIGHT UPPER EXTREMITY ARTERIAL DUPLEX SCAN TECHNIQUE: Gray-scale sonography as well as color Doppler and duplex ultrasound was performed to evaluate the arteries of the upper extremity. COMPARISON:  None. FINDINGS: Nonocclusive thrombus is seen within the right subclavian artery. Right axillary artery, brachial artery and proximal radial artery are patent. Occlusive thrombus seen in the distal radial artery and in the  mid to distal ulnar artery. IMPRESSION: Occlusive thrombus within the radial artery and ulnar artery at the right wrist. Nonocclusive thrombus within the subclavian artery. This could be a source of emboli. These results were called by telephone at the time of interpretation on 03/09/2020 at 10:34 pm to provider [facility] , who verbally acknowledged these results. Electronically Signed   By: Rolm Baptise M.D.   On: 03/09/2020 22:34      Assessment/Plan 1.  Ischemic right upper extremity.  Duplex suggests thrombus within the radial and ulnar arteries which are occlusive as well as subclavian artery thrombus.  This will be best managed by going to the operating room performing embolectomy to try to remove the radial and ulnar thrombus as well as passing an embolectomy catheter to see if the subclavian thrombus can be removed.  Depending on the inflow, imaging can be performed and subsequent endovascular therapy for a subclavian artery lesion can be considered in the future if this persists.  Acutely, she needs to go to the operating room for immediate revascularization.  Risks and benefits are discussed. 2.  Hypercoagulable state.  The patient has a previous history of spontaneous aortic thrombus and clearly has a chronic hypercoagulable state.  She will need  to be on lifelong anticoagulation and would likely need bridging for any procedures in the future. 3.  Abdominal pain.  CT scan of the chest abdomen pelvis can be performed after her right upper extremity revascularization for further evaluation.  She may ultimately require further treatment for this depending on what the source of the pain is.  This was discussed with the patient.   [name], MD  03/09/2020 10:57 PM    This note was created with Dragon medical transcription system.  Any error is purely unintentional

## 2020-03-09 NOTE — ED Triage Notes (Signed)
First Rn note: Pt to ED via [facility] from home with c/o RLQ abdominal pain x 1.5 days. Per EMS no rebound tenderness/swelling/distension. Per EMS pt also with hand pain x 0.5 hrs ago, hx of various clots in different locations. Per EMS pt also c/o N/V.   182/108 76Hr 16RR 99%RA

## 2020-03-09 NOTE — Anesthesia Procedure Notes (Signed)
Procedure Name: Intubation Date/Time: 03/09/2020 11:40 PM Performed by: Chanetta Marshall, CRNA Pre-anesthesia Checklist: Patient identified, Emergency Drugs available, Suction available and Patient being monitored Patient Re-evaluated:Patient Re-evaluated prior to induction Oxygen Delivery Method: Circle system utilized Preoxygenation: Pre-oxygenation with 100% oxygen Induction Type: IV induction Ventilation: Mask ventilation without difficulty Laryngoscope Size: McGraph and 3 Grade View: Grade I Tube type: Oral Tube size: 7.0 mm Number of attempts: 1 Airway Equipment and Method: Stylet and Video-laryngoscopy Placement Confirmation: ETT inserted through vocal cords under direct vision,  positive ETCO2,  breath sounds checked- equal and bilateral and CO2 detector Secured at: 20 cm Tube secured with: Tape Dental Injury: Teeth and Oropharynx as per pre-operative assessment

## 2020-03-09 NOTE — H&P (Addendum)
History and Physical    SELBY SLOVACEK BDZ:[id number] DOB: 07-17-1975 DOA: 03/09/2020  PCP: System, Provider Not In   Patient coming from: Home  I have personally briefly reviewed patient's old medical records in [place]  Chief Complaint: Abdominal pain                               Right hand pain  HPI: Suzanne [name] is a 44 y.o. 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  Abdominal pain is nonradiating.  She denies having any changes in her bowel habits.  She also complains of sudden onset pain in her right hand/wrist which she rates 10 x 10 in intensity at its worst.  She denies having any trauma and the pain woke her up from a nap.  Onset of pain was about 30 minutes prior to arriving to the emergency room by EMS.   Patient admits to being noncompliant with her Eliquis and has been out of Eliquis 1 month.  Prior to starting Eliquis she was on Coumadin and is also noncompliant.  She admits to continued nicotine use She denies having any chest pain, no shortness of breath, no dizziness, no lightheadedness or any changes in her bowel habits.  She denies having any chills, no cough Labs show sodium 133, potassium 3.9, chloride 100, bicarb 24, glucose 120, BUN 12, creatinine 0.8, calcium 8.7, alkaline phosphatase 76, lipase 20, AST 72, ALT 40, total protein 7.2, white count 22.9 globin 15.4, hematocrit 46.7, MCV 80.9, RDW 18.4, platelet count 242, PT 13.8, INR 1.1 Urinalysis is sterile Right upper extremity arterial duplex shows nonocclusive thrombus within the radial artery and ulnar artery at the right wrist.  Nonocclusive thrombus within the subclavian arteries could be  source of emboli.     ED Course: Patient is a 44 year old Caucasian female with a known history of recurrent aortic thrombus, splenic and renal infarct who supposed to be on lifetime anticoagulation therapy but has been noncompliant.  She presents for evaluation of pain in the right periumbilical area associated with nausea and vomiting as well as sudden onset of pain in her right hand.  Patient noted to have an occlusive thrombus in the right radial and ulnar artery as well as in the right subclavian artery.  She is currently on a heparin drip and is scheduled to go to the OR for an embolectomy vascular surgery. Abdominal imaging is currently on hold until patient comes out of the OR.  Review of Systems: As per HPI otherwise 10 point review of systems negative.    Past Medical History:  Diagnosis Date  . Aortic thrombus ([place])   . 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 CESAREAN SECTION     times 4     reports that she has been smoking cigarettes. She has a 5.00 pack-year smoking history. She has never used smokeless tobacco. She reports current alcohol use. She reports that she does not use drugs.  No Known Allergies  Family History  Adopted: Yes  Problem Relation Age of Onset  . Hypertension Mother  Prior to Admission medications   Medication Sig Start Date End Date Taking? Authorizing Provider  albuterol (PROVENTIL HFA;VENTOLIN HFA) 108 (90 Base) MCG/ACT inhaler Inhale 2 puffs into the lungs every 6 (six) hours as needed for wheezing or shortness of breath. Patient not taking: Reported on 12/21/2018 11/21/16   Merlyn Lot, MD  apixaban (ELIQUIS) 5 MG TABS tablet Take 2 tablets (10 mg total) by mouth 2 (two) times daily. And then 5 mg bid starting 12/31/2018 12/23/18   Fritzi Mandes, MD    Physical Exam: Vitals:   03/09/20 1951 03/09/20 1952 03/09/20 2145  BP: (!) 185/104  (!) 191/106  Pulse: 74  60  Resp: 18  15  Temp: 98.9 F  (37.2 C)    TempSrc: Oral    SpO2: 100%  100%  Weight:  61.2 kg   Height:  5\' 7"  (1.702 m)      Vitals:   03/09/20 1951 03/09/20 1952 03/09/20 2145  BP: (!) 185/104  (!) 191/106  Pulse: 74  60  Resp: 18  15  Temp: 98.9 F (37.2 C)    TempSrc: Oral    SpO2: 100%  100%  Weight:  61.2 kg   Height:  5\' 7"  (1.702 m)     Constitutional: NAD, alert and oriented x 3.  Appears to be stable and in pain Eyes: PERRL, lids and conjunctivae pallor ENMT: Mucous membranes are moist.  Neck: normal, supple, no masses, no thyromegaly Respiratory: clear to auscultation bilaterally, no wheezing, no crackles. Normal respiratory effort. No accessory muscle use.  Cardiovascular: Regular rate and rhythm,no murmurs / rubs / gallops. No extremity edema. 2+ pedal pulses. No carotid bruits.  Abdomen: Periumbilical tenderness (rt sided), no masses palpated. No hepatosplenomegaly. Bowel sounds positive.  Musculoskeletal: no clubbing / cyanosis. No joint deformity upper and lower extremities.  Right hand is cold compared to the left, unable to palpate radial pulse Skin: no rashes, lesions, ulcers.  Neurologic: No gross focal neurologic deficit. Psychiatric: Normal mood and affect.   Labs on Admission: I have personally reviewed following labs and imaging studies  CBC: Recent Labs  Lab 03/09/20 1956  WBC 22.9*  HGB 15.4*  HCT 46.7*  MCV 80.9  PLT 629   Basic Metabolic Panel: Recent Labs  Lab 03/09/20 1956  NA 133*  K 3.9  CL 100  CO2 24  GLUCOSE 120*  BUN 12  CREATININE 0.88  CALCIUM 8.7*   GFR: Estimated Creatinine Clearance: 79.6 mL/min (by C-G formula based on SCr of 0.88 mg/dL). Liver Function Tests: Recent Labs  Lab 03/09/20 1956  AST 72*  ALT 40  ALKPHOS 76  BILITOT 0.9  PROT 7.2  ALBUMIN 3.3*   Recent Labs  Lab 03/09/20 1956  LIPASE 20   No results for input(s): AMMONIA in the last 168 hours. Coagulation Profile: Recent Labs  Lab 03/09/20 2147  INR 1.1    Cardiac Enzymes: No results for input(s): CKTOTAL, CKMB, CKMBINDEX, TROPONINI in the last 168 hours. BNP (last 3 results) No results for input(s): PROBNP in the last 8760 hours. HbA1C: No results for input(s): HGBA1C in the last 72 hours. CBG: No results for input(s): GLUCAP in the last 168 hours. Lipid Profile: No results for input(s): CHOL, HDL, LDLCALC, TRIG, CHOLHDL, LDLDIRECT in the last 72 hours. Thyroid Function Tests: No results for input(s): TSH, T4TOTAL, FREET4, T3FREE, THYROIDAB in the last 72 hours. Anemia Panel: No results for input(s): VITAMINB12, FOLATE, FERRITIN, TIBC, IRON, RETICCTPCT in the last 72 hours. Urine  analysis:    Component Value Date/Time   COLORURINE YELLOW (A) 03/09/2020 1956   APPEARANCEUR HAZY (A) 03/09/2020 1956   APPEARANCEUR Cloudy 04/23/2011 1538   LABSPEC 1.014 03/09/2020 1956   LABSPEC 1.003 04/23/2011 1538   PHURINE 6.0 03/09/2020 1956   GLUCOSEU NEGATIVE 03/09/2020 1956   GLUCOSEU Negative 04/23/2011 1538   HGBUR SMALL (A) 03/09/2020 1956   BILIRUBINUR NEGATIVE 03/09/2020 1956   BILIRUBINUR Negative 04/23/2011 1538   KETONESUR 5 (A) 03/09/2020 1956   PROTEINUR 100 (A) 03/09/2020 1956   NITRITE NEGATIVE 03/09/2020 1956   LEUKOCYTESUR NEGATIVE 03/09/2020 1956   LEUKOCYTESUR 3+ 04/23/2011 1538    Radiological Exams on Admission: [place] UPPER EXTREMITY DUPLEX RIGHT (NON-WBI)  Result Date: 03/09/2020 CLINICAL DATA:  Cold painful right hand and wrist EXAM: RIGHT UPPER EXTREMITY ARTERIAL DUPLEX SCAN TECHNIQUE: Gray-scale sonography as well as color Doppler and duplex ultrasound was performed to evaluate the arteries of the upper extremity. COMPARISON:  None. FINDINGS: Nonocclusive thrombus is seen within the right subclavian artery. Right axillary artery, brachial artery and proximal radial artery are patent. Occlusive thrombus seen in the distal radial artery and in the mid to distal ulnar artery. IMPRESSION: Occlusive thrombus within the  radial artery and ulnar artery at the right wrist. Nonocclusive thrombus within the subclavian artery. This could be a source of emboli. These results were called by telephone at the time of interpretation on 03/09/2020 at 10:34 pm to provider [facility] , who verbally acknowledged these results. Electronically Signed   By: Rolm Baptise M.D.   On: 03/09/2020 22:34    EKG: Independently reviewed.   Assessment/Plan Principal Problem:   Arterial thrombosis ([facility]) Active Problems:   Splenic infarction   Tobacco dependence    Arterial thrombosis Patient presents for evaluation of sudden onset pain in her right hand and wrist Arterial duplex shows an occlusive thrombus involving the radial and ulnar artery on the right as well as a thrombosed in the right subclavian which appears to be the source of emboli Continue heparin drip per protocol Vascular surgery has been consulted and plans on taking patient to the OR for an embolectomy    Abdominal pain ??  Concern for recurrent aortic thrombus Patient has been noncompliant with prescribed anticoagulant therapy Eliquis for about a month CT angiogram of the abdomen pelvis has been ordered but will not be done until patient is out of the OR Continue heparin drip Pain control    Nicotine dependence Smoking cessation has been discussed with patient in detail We will place patient on a nicotine transdermal patch 21 mg daily     Hypertension Uncontrolled Most likely related to pain Will place patient on IV hydralazine PRN for SBP > 151mmHg   DVT prophylaxis: Heparin Code Status: Full code Family Communication: Greater than 50% of time was spent discussing plan of care with patient at the bedside.  She verbalizes understanding and agrees with the plan of care. Disposition Plan: Back to previous home environment Consults called: Vascular surgery    Madelene Kaatz MD [facility]     03/09/2020, 11:03 PM

## 2020-03-09 NOTE — ED Provider Notes (Addendum)
[facility] Emergency Department Provider Note ____________________________________________   First MD Initiated Contact with Patient 03/09/20 2044     (approximate)  I have reviewed the triage vital signs and the nursing notes.  HISTORY  Chief Complaint Abdominal Pain   HPI Suzanne [name] is a 44 y.o. femalewho presents to the ED for evaluation of abdominal pain.  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  Medical admission 1 year ago at our facility where she was transitioned to Eliquis from warfarin.  Patient was running out of her Eliquis over a month ago and has had no anticoagulation in this timeframe.  She reports a couple days of right-sided abdominal pain, primarily to her RLQ, that is waxing and waning up to 8/10 intensity with associated nausea and emesis.  She reports countless episodes of nonbloody nonbilious emesis.  She denies stool changes or diarrhea.  Denies vaginal discharge and dysuria.  Patient also reports about 1 hour of sudden onset right hand and wrist pain that is atraumatic.  She reports the sensation woke her from a nap this evening and is the reason why she comes to the ED for evaluation.  This has never happened before.   Past Medical History:  Diagnosis Date  . Aortic thrombus ([place])   . 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 DVT (deep venous thrombosis) ([place]) 12/21/2018  . 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 Hyperlipidemia 12/18/2017  . Chest pain 01/22/2016  . Tobacco dependence 01/22/2016  . 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 Aortic thrombus ([place]) 01/15/2015    Past Surgical History:  Procedure Laterality Date  . CESAREAN SECTION     times 4    Prior to Admission medications   Medication Sig Start Date End Date Taking? Authorizing Provider  albuterol (PROVENTIL HFA;VENTOLIN HFA)  108 (90 Base) MCG/ACT inhaler Inhale 2 puffs into the lungs every 6 (six) hours as needed for wheezing or shortness of breath. Patient not taking: Reported on 12/21/2018 11/21/16   Merlyn Lot, MD  apixaban (ELIQUIS) 5 MG TABS tablet Take 2 tablets (10 mg total) by mouth 2 (two) times daily. And then 5 mg bid starting 12/31/2018 12/23/18   Fritzi Mandes, MD    Allergies Patient has no known allergies.  Family History  Adopted: Yes  Problem Relation Age of Onset  . Hypertension Mother     Social History Social History   Tobacco Use  . Smoking status: Current Some Day Smoker    Packs/day: 0.25    Years: 20.00    Pack years: 5.00    Types: Cigarettes  . Smokeless tobacco: Never Used  Substance Use Topics  . Alcohol use: Yes    Comment: occ  . Drug use: No    Review of Systems  Constitutional: No fever/chills Eyes: No visual changes. ENT: No sore throat. Cardiovascular: Denies chest pain. Respiratory: Denies shortness of breath. Gastrointestinal: Positive for abdominal pain, nausea and emesis.  No diarrhea.  No constipation. Genitourinary: Negative for dysuria. Musculoskeletal: Atraumatic right hand pain.  Negative for back pain. Skin: Negative for rash. Neurological: Negative for headaches, focal weakness or numbness.  ____________________________________________   PHYSICAL EXAM:  VITAL SIGNS: Vitals:   03/09/20 1951 03/09/20 2145  BP: (!) 185/104 (!) 191/106  Pulse: 74 60  Resp: 18 15  Temp: 98.9 F (37.2 C)   SpO2: 100% 100%  Constitutional: Alert and oriented. Well appearing and in no acute distress.  Uncomfortable-appearing, but conversational in full sentences. Eyes: Conjunctivae are normal. PERRL. EOMI. Head: Atraumatic. Nose: No congestion/rhinnorhea. Mouth/Throat: Mucous membranes are moist.  Oropharynx non-erythematous. Neck: No stridor. No cervical spine tenderness to palpation. Cardiovascular: Normal rate, regular rhythm. Grossly normal heart  sounds.    Absent right radial pulse and strong 2+ left radial pulse.  Right hand is pale compared to the left, cool to the touch with poor capillary refill.  This extends proximally just beyond the wrist joint to the distal portion of her forearm.  Proximal to this it is warm with brisk capillary refill.  No overlying signs of trauma.  Compartments are soft in the forearm.  Bilateral lower extremities with symmetric 1+ DP pulses without asymmetry.  Respiratory: Normal respiratory effort.  No retractions. Lungs CTAB. Gastrointestinal: Soft , nondistended, .  Poorly localizing right-sided tenderness to palpation without peritoneal features. Musculoskeletal: No lower extremity tenderness nor edema.  No joint effusions. No signs of acute trauma. Neurologic:  Normal speech and language. No gross focal neurologic deficits are appreciated. No gait instability noted. Skin:  Skin is warm, dry and intact. No rash noted. Psychiatric: Mood and affect are normal. Speech and behavior are normal.  ____________________________________________   LABS (all labs ordered are listed, but only abnormal results are displayed)  Labs Reviewed  COMPREHENSIVE METABOLIC PANEL - Abnormal; Notable for the following components:      Result Value   Sodium 133 (*)    Glucose, Bld 120 (*)    Calcium 8.7 (*)    Albumin 3.3 (*)    AST 72 (*)    All other components within normal limits  CBC - Abnormal; Notable for the following components:   WBC 22.9 (*)    RBC 5.77 (*)    Hemoglobin 15.4 (*)    HCT 46.7 (*)    RDW 19.4 (*)    All other components within normal limits  URINALYSIS, COMPLETE (UACMP) WITH MICROSCOPIC - Abnormal; Notable for the following components:   Color, Urine YELLOW (*)    APPearance HAZY (*)    Hgb urine dipstick SMALL (*)    Ketones, ur 5 (*)    Protein, ur 100 (*)    All other components within normal limits  RESP PANEL BY RT-PCR (FLU A&B, COVID) ARPGX2  LIPASE, BLOOD  PROTIME-INR    APTT  APTT  CBC  HEPARIN LEVEL (UNFRACTIONATED)  POC URINE PREG, ED   ____________________________________________  RADIOLOGY  ED MD interpretation: Vascular duplex of the right upper extremity reviewed by me with evidence of acute thrombus to the radial and ulnar arteries.  Official radiology report(s): [place] UPPER EXTREMITY DUPLEX RIGHT (NON-WBI)  Result Date: 03/09/2020 CLINICAL DATA:  Cold painful right hand and wrist EXAM: RIGHT UPPER EXTREMITY ARTERIAL DUPLEX SCAN TECHNIQUE: Gray-scale sonography as well as color Doppler and duplex ultrasound was performed to evaluate the arteries of the upper extremity. COMPARISON:  None. FINDINGS: Nonocclusive thrombus is seen within the right subclavian artery. Right axillary artery, brachial artery and proximal radial artery are patent. Occlusive thrombus seen in the distal radial artery and in the mid to distal ulnar artery. IMPRESSION: Occlusive thrombus within the radial artery and ulnar artery at the right wrist. Nonocclusive thrombus within the subclavian artery. This could be a source of emboli. These results were called by telephone at the time of interpretation on 03/09/2020 at 10:34 pm to provider [facility] , who verbally acknowledged these  results. Electronically Signed   By: Rolm Baptise M.D.   On: 03/09/2020 22:34    ____________________________________________   PROCEDURES and INTERVENTIONS  Procedure(s) performed (including Critical Care):  .1-3 Lead EKG Interpretation Performed by: Vladimir Crofts, MD Authorized by: Vladimir Crofts, MD     Interpretation: normal     ECG rate:  64   ECG rate assessment: normal     Rhythm: sinus rhythm     Ectopy: none     Conduction: normal   .Critical Care Performed by: Vladimir Crofts, MD Authorized by: Vladimir Crofts, MD   Critical care provider statement:    Critical care time (minutes):  50   Critical care was necessary to treat or prevent imminent or life-threatening deterioration of the  following conditions:  Circulatory failure   Critical care was time spent personally by me on the following activities:  Discussions with consultants, evaluation of patient's response to treatment, examination of patient, ordering and performing treatments and interventions, ordering and review of laboratory studies, ordering and review of radiographic studies, pulse oximetry, re-evaluation of patient's condition, obtaining history from patient or surrogate and review of old charts    Medications  heparin ADULT infusion 100 units/mL (25000 units/223mL sodium chloride 0.45%) (1,400 Units/hr Intravenous New Bag/Given 03/09/20 2146)  iohexol (OMNIPAQUE) 350 MG/ML injection 100 mL (has no administration in time range)  lactated ringers bolus 1,000 mL (1,000 mLs Intravenous New Bag/Given 03/09/20 2139)  morphine 4 MG/ML injection 4 mg (4 mg Intravenous Given 03/09/20 2139)  ondansetron (ZOFRAN) injection 4 mg (4 mg Intravenous Given 03/09/20 2145)  heparin bolus via infusion 4,800 Units (4,800 Units Intravenous Bolus from Bag 03/09/20 2145)    ____________________________________________   MDM / ED COURSE   44 year old woman with known coagulopathy presenting to the ED with acute arterial embolus to the right hand requiring urgent vascular endarterectomy.  Hemodynamically stable.  Exam demonstrates an obviously asymmetric right hand compared to the left with evidence of arterial vascular compromise, and no evidence of trauma.  Heparin drip was immediately started and arterial duplex was obtained, with evidence of acute occlusion of right radial and ulnar arteries.  Vascular surgery urgently consulted prior to this imaging and sees the patient, recommending OR for endarterectomy.  CTA of her aorta was also ordered to assess for reaccumulation of her aortic thrombus versus other intra-abdominal pathology to cause her right-sided pain.  While she does have significant tenderness here, she has no  peritoneal features and does not have surgical abdomen.  She needs to go to the OR for endarterectomy to save her right hand, and so CTA was held at this time and will be performed after the OR.  Admitting hospitalist is aware of this and will follow up on this CT imaging.   Clinical Course as of Mar 09 2337  Fri Mar 09, 2020  2052 HCT(!): 46.7 [HS]  2105 Vascular surgery paged.   [DS]  2108 Spoke with Dr. Lucky Cowboy, vascular surgery He agrees with initiating heparin and he will look out for CT imaging of her aorta. Indicates that she will likely need endarterectomy or bypass of her right upper extremity today.   [DS]  2207 Reassessed.  Arterial vascular ultrasound is ongoing.  Technician reports concern for multiple clots in the radial, ulnar and right-sided subclavian arteries.  Awaiting CT imaging.  Heparin drip is ongoing.  Patient reports improving pain.   [DS]  2224 Call from Dr. Lucky Cowboy who has reviewed arterial duplex and recommends immediate transfer to  the OR for endarterectomy   [DS]  2255 Spoke with Dr. Francine Graven who agrees to see the patient for admission. We discussed need for patient go to the OR immediately for endarterectomy with vascular surgery. We discussed pending CT imaging and some uncertainty of the etiology of her abdominal pain, but is less urgent than her hand. Hospitalist will come see the patient now prior to patient going to the OR.   [DS]    Clinical Course User Index [DS] Vladimir Crofts, MD [HS] Sherrill Raring, [place]    ____________________________________________   FINAL CLINICAL IMPRESSION(S) / ED DIAGNOSES  Final diagnoses:  Radial artery occlusion, right ([place])  Obstruction of right ulnar artery ([place])  Generalized abdominal pain  Right hand pain     ED Discharge Orders    None       Kalis Friese   Note:  This document was prepared using Dragon voice recognition software and may include unintentional dictation errors.   Vladimir Crofts, MD 03/09/20  2340    Vladimir Crofts, MD 03/09/20 [phone]

## 2020-03-09 NOTE — ED Triage Notes (Signed)
Patient reports mid lower abdominal pain that began yesterday am.  Reports +nausea and vomiting.

## 2020-03-10 ENCOUNTER — Encounter: Payer: Self-pay | Admitting: Internal Medicine

## 2020-03-10 ENCOUNTER — Inpatient Hospital Stay: Payer: Medicaid Other

## 2020-03-10 DIAGNOSIS — I749 Embolism and thrombosis of unspecified artery: Secondary | ICD-10-CM | POA: Diagnosis not present

## 2020-03-10 DIAGNOSIS — I998 Other disorder of circulatory system: Secondary | ICD-10-CM

## 2020-03-10 LAB — CBC
HCT: 40.1 % (ref 36.0–46.0)
Hemoglobin: 12.9 g/dL (ref 12.0–15.0)
MCH: 26.3 pg (ref 26.0–34.0)
MCHC: 32.2 g/dL (ref 30.0–36.0)
MCV: 81.7 fL (ref 80.0–100.0)
Platelets: 280 10*3/uL (ref 150–400)
RBC: 4.91 MIL/uL (ref 3.87–5.11)
RDW: 18.4 % — ABNORMAL HIGH (ref 11.5–15.5)
WBC: 19.4 10*3/uL — ABNORMAL HIGH (ref 4.0–10.5)
nRBC: 0 % (ref 0.0–0.2)

## 2020-03-10 LAB — HEPARIN LEVEL (UNFRACTIONATED)
Heparin Unfractionated: 0.74 IU/mL — ABNORMAL HIGH (ref 0.30–0.70)
Heparin Unfractionated: 0.32 IU/mL (ref 0.30–0.70)
Heparin Unfractionated: 0.22 IU/mL — ABNORMAL LOW (ref 0.30–0.70)

## 2020-03-10 LAB — BASIC METABOLIC PANEL
Anion gap: 9 (ref 5–15)
BUN: 10 mg/dL (ref 6–20)
CO2: 23 mmol/L (ref 22–32)
Calcium: 8.2 mg/dL — ABNORMAL LOW (ref 8.9–10.3)
Chloride: 103 mmol/L (ref 98–111)
Creatinine, Ser: 0.78 mg/dL (ref 0.44–1.00)
GFR, Estimated: >60 mL/min (ref >60)
Glucose, Bld: 100 mg/dL — ABNORMAL HIGH (ref 70–99)
Potassium: 3.7 mmol/L (ref 3.5–5.1)
Sodium: 135 mmol/L (ref 135–145)

## 2020-03-10 LAB — HIV ANTIBODY (ROUTINE TESTING W REFLEX): HIV Screen 4th Generation wRfx: NONREACTIVE

## 2020-03-10 LAB — APTT: aPTT: 157 seconds — ABNORMAL HIGH (ref 24–36)

## 2020-03-10 MED ORDER — ONDANSETRON HCL 4 MG/2ML IJ SOLN
INTRAMUSCULAR | Status: AC
  Administered 2020-03-10: 4 mg via INTRAVENOUS
  Filled 2020-03-10: qty 2

## 2020-03-10 MED ORDER — FENTANYL CITRATE (PF) 100 MCG/2ML IJ SOLN
INTRAMUSCULAR | Status: AC
  Administered 2020-03-10: 50 ug via INTRAVENOUS
  Filled 2020-03-10: qty 2

## 2020-03-10 MED ORDER — SODIUM CHLORIDE 0.9 % IV SOLN: INTRAVENOUS | Status: DC

## 2020-03-10 MED ORDER — OXYCODONE HCL 5 MG PO TABS
5.0000 mg | ORAL_TABLET | Freq: Once | ORAL | Status: AC | PRN
  Administered 2020-03-10: 5 mg via ORAL

## 2020-03-10 MED ORDER — SODIUM CHLORIDE 0.9 % IV SOLN
INTRAVENOUS | Status: DC | PRN
  Administered 2020-03-10: 50 mL via INTRAMUSCULAR

## 2020-03-10 MED ORDER — OXYCODONE HCL 5 MG/5ML PO SOLN: 5.0000 mg | Freq: Once | ORAL | Status: AC | PRN

## 2020-03-10 MED ORDER — FENTANYL CITRATE (PF) 100 MCG/2ML IJ SOLN
INTRAMUSCULAR | Status: AC
  Administered 2020-03-10: 25 ug via INTRAVENOUS
  Filled 2020-03-10: qty 2

## 2020-03-10 MED ORDER — OXYCODONE HCL 5 MG PO TABS
ORAL_TABLET | ORAL | Status: AC
  Filled 2020-03-10: qty 1

## 2020-03-10 MED ORDER — ACETAMINOPHEN 10 MG/ML IV SOLN
1000.0000 mg | Freq: Once | INTRAVENOUS | Status: DC | PRN
  Administered 2020-03-10: 1000 mg via INTRAVENOUS

## 2020-03-10 MED ORDER — HEPARIN BOLUS VIA INFUSION
800.0000 [IU] | Freq: Once | INTRAVENOUS | Status: AC
  Administered 2020-03-10: 800 [IU] via INTRAVENOUS
  Filled 2020-03-10: qty 800

## 2020-03-10 MED ORDER — FENTANYL CITRATE (PF) 100 MCG/2ML IJ SOLN: 50.0000 ug | Freq: Once | INTRAMUSCULAR | Status: AC

## 2020-03-10 MED ORDER — FIBRIN SEALANT 2 ML SINGLE DOSE KIT
PACK | CUTANEOUS | Status: DC | PRN
  Administered 2020-03-10: 2 mL via TOPICAL

## 2020-03-10 MED ORDER — HEPARIN (PORCINE) 25000 UT/250ML-% IV SOLN
1600.0000 [IU]/h | INTRAVENOUS | Status: DC
  Administered 2020-03-10: 1200 [IU]/h via INTRAVENOUS
  Administered 2020-03-11: 1400 [IU]/h via INTRAVENOUS
  Administered 2020-03-12: 1600 [IU]/h via INTRAVENOUS
  Filled 2020-03-10 (×4): qty 250

## 2020-03-10 MED ORDER — PROMETHAZINE HCL 25 MG/ML IJ SOLN: 12.5000 mg | Freq: Once | INTRAMUSCULAR | Status: DC

## 2020-03-10 MED ORDER — FENTANYL CITRATE (PF) 100 MCG/2ML IJ SOLN
25.0000 ug | INTRAMUSCULAR | Status: AC | PRN
  Administered 2020-03-10 (×5): 25 ug via INTRAVENOUS

## 2020-03-10 MED ORDER — ONDANSETRON HCL 4 MG/2ML IJ SOLN: 4.0000 mg | Freq: Once | INTRAMUSCULAR | Status: AC | PRN

## 2020-03-10 MED ORDER — ACETAMINOPHEN 10 MG/ML IV SOLN
INTRAVENOUS | Status: AC
  Filled 2020-03-10: qty 100

## 2020-03-10 MED ORDER — HYDROMORPHONE HCL 1 MG/ML IJ SOLN
INTRAMUSCULAR | Status: AC
  Administered 2020-03-10: 0.5 mg via INTRAVENOUS
  Filled 2020-03-10: qty 1

## 2020-03-10 MED ORDER — HYDROMORPHONE HCL 1 MG/ML IJ SOLN
0.5000 mg | INTRAMUSCULAR | Status: AC | PRN
  Administered 2020-03-10 (×2): 0.5 mg via INTRAVENOUS

## 2020-03-10 NOTE — Consult Note (Signed)
ANTICOAGULATION CONSULT NOTE  Pharmacy Consult for heparin infusion Indication: DVT  No Known Allergies  Patient Measurements: Height: 5\' 7"  (170.2 cm) Weight: 61.2 kg (135 lb) IBW/kg (Calculated) : 61.6  Vital Signs: Temp: 97 F (36.1 C) (11/20 0645) BP: 103/70 (11/20 0901) Pulse Rate: 56 (11/20 0901)  Labs: Recent Labs    03/09/20 1956 03/09/20 2147 03/10/20 0540  HGB 15.4*  --  12.9  HCT 46.7*  --  40.1  PLT 342  --  280  APTT  --  28 157*  LABPROT  --  13.8  --   INR  --  1.1  --   HEPARINUNFRC  --   --  0.74*  CREATININE 0.88  --  0.78    Estimated Creatinine Clearance: 87.6 mL/min (by C-G formula based on SCr of 0.78 mg/dL).   Medical History: Past Medical History:  Diagnosis Date  . Aortic thrombus ([place])   . 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 femalewho presents to the ED for evaluation of abdominal pain. 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  Medical admission 1 year ago at our facility where she was transitioned to Eliquis from warfarin. Her last dose of apixaban is unable to be determined at this time. She was admitted in September 2020 and was on a heparin infusion for an aortic clot. Data from that admission was used to guide initial heparin infusion rate.  Goal of Therapy:  aPTT 66 - 102 seconds Monitor platelets by anticoagulation protocol: Yes   Plan:  Heparin restarted at 1200 units/hr @0800 . Will order heparin level in 6 hours and CBC with AM labs.   Cydnie Deason S Karma Hiney 03/10/2020,10:25 AM

## 2020-03-10 NOTE — Progress Notes (Signed)
[facility]  Daily Progress Note   Subjective  -   Feels much better this morning. Arm and hand is warm Mild bruising at surgery site Some N/V abd pain is stable  Objective Vitals:   03/10/20 0700 03/10/20 0731 03/10/20 0800 03/10/20 0840  BP: 99/68 98/74 93/74    Pulse: 76 71 60 70  Resp: (!) 24 13 15 17   Temp:      TempSrc:      SpO2: 98% 95% 95% 99%  Weight:      Height:        Intake/Output Summary (Last 24 hours) at 03/10/2020 0845 Last data filed at 03/10/2020 0644 Gross per 24 hour  Intake 640 ml  Output 375 ml  Net 265 ml    PULM  CTAB CV  RRR VASC  2+ right radial pulse  Laboratory CBC    Component Value Date/Time   WBC 19.4 (H) 03/10/2020 0540   HGB 12.9 03/10/2020 0540   HGB 17.1 (H) 04/23/2011 1538   HCT 40.1 03/10/2020 0540   HCT 50.5 (H) 04/23/2011 1538   PLT 280 03/10/2020 0540   PLT 419 04/23/2011 1538    BMET    Component Value Date/Time   NA 135 03/10/2020 0540   NA 140 04/23/2011 1538   K 3.7 03/10/2020 0540   K 3.9 04/23/2011 1538   CL 103 03/10/2020 0540   CL 105 04/23/2011 1538   CO2 23 03/10/2020 0540   CO2 25 04/23/2011 1538   GLUCOSE 100 (H) 03/10/2020 0540   GLUCOSE 117 (H) 04/23/2011 1538   BUN 10 03/10/2020 0540   BUN 2 (L) 04/23/2011 1538   CREATININE 0.78 03/10/2020 0540   CREATININE 0.63 04/23/2011 1538   CALCIUM 8.2 (L) 03/10/2020 0540   CALCIUM 8.9 04/23/2011 1538   GFRNONAA >60 03/10/2020 0540   GFRNONAA >60 04/23/2011 1538   GFRAA >60 12/22/2018 0839   GFRAA >60 04/23/2011 1538    Assessment/Planning: POD #0 s/p right arm embolectomy   hand perfusion appears intact.  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    The renal and splenic infarcts require no intervention  Could develop bowel ischemia, but the IMA is too small for revascularization and  the SMA and celiac are patent. If her abd pain worsens or she shows signs of ischemia, general surgery will need to see  Anticoagulation alone the only recommended treatment from vascular standpoint at this time      [name]  03/10/2020, 8:45 AM

## 2020-03-10 NOTE — Transfer of Care (Signed)
Immediate Anesthesia Transfer of Care Note  Patient: Suzanne [name]  Procedure(s) Performed: Brachial EMBOLECTOMY (Right Arm Upper)  Patient Location: PACU  Anesthesia Type:General  Level of Consciousness: awake, alert  and oriented  Airway & Oxygen Therapy: Patient Spontanous Breathing  Post-op Assessment: Report given to RN and Post -op Vital signs reviewed and stable  Post vital signs: Reviewed and stable  Last Vitals:  Vitals Value Taken Time  BP    Temp    Pulse    Resp    SpO2      Last Pain:  Vitals:   03/09/20 1952  TempSrc:   PainSc: 10-Worst pain ever         Complications: No complications documented.

## 2020-03-10 NOTE — Progress Notes (Addendum)
PROGRESS NOTE    Suzanne [name]   DXI:[id number]  DOB: October 25, 1975  PCP: System, Provider Not In    DOA: 03/09/2020 LOS: 1   Brief Narrative   Suzanne [name] is a 44 y.o. 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  Patient reported having run out of her Eliquis 1 month ago.  Of note, has a history of prior noncompliance with warfarin for anticoagulation and continues to smoke.  Admitted to hospitalist service with vascular surgery consulted   On heparin drip.  Taken to OR with vascular surgery for immediate revascularization of the radial and ulnar arterial thromboses to prevent loss of the distal right upper extremity.     Assessment & Plan   Principal Problem:   Arterial thrombosis (HCC) Active Problems:   Splenic infarction   Tobacco dependence   Ischemic right upper extremity secondary to arterial thrombosis of the right radial and ulnar arteries -also has right subclavian and innominate arterial thromboses.  Hypercoagulable history as outlined above with ongoing nicotine use and noncompliance with anticoagulation. --Vascular surgery following, taken to the OR for immediate embolectomy of the radial and ulnar occlusive thrombi to prevent loss of the distal right upper extremity --Continue heparin drip  Multiple arterial thrombi -involving right upper extremity as above, right kidney, IMA, thoracic and abdominal aorta, small acute splenic infarct and multiple chronic splenic infarcts.  On heparin as above with vascular following. --Noncompliant with Eliquis, ran out a month prior to presentation  Abdominal pain -likely secondary to recurrent or progressive thrombosis involving  spleen and IMA.  Vascular surgery feels these are best managed with anticoagulation as above.  Pain control as needed per orders.  Nicotine dependence -patient counseled extensively on smoking cessation and increased risk of blood clotting with ongoing use.  Nicotine patch ordered.  Hypertensive urgency -on admission patient's BP was uncontrolled as high as 191/106.  Now controlled.  Suspect due to severe pain, and question noncompliance with BP meds, nicotine use.  Patient is on antihypertensives as outpatient.  As needed hydralazine for now.    Leukocytosis - suspect reactive in setting of diffuse thrombi.  No evidence of symptoms of infection.  Monitor CBC and for clinical s/sx's of infection.   Patient BMI: Body mass index is 21.14 kg/m.   DVT prophylaxis: on heparin drip   Diet:  Diet Orders (From admission, onward)    Start     Ordered   03/10/20 0606  Diet regular Room service appropriate? Yes; Fluid consistency: Thin  Diet effective now       Question Answer Comment  Room service appropriate? Yes   Fluid consistency: Thin      03/10/20 7673            Code Status: Full Code    Subjective 03/10/20    Pt seen with s/o at bedside today.  She reports right upper extremity pain better after surgery.  Hand is now normal color and temperature.  She denies other acute complaints including abdominal or flank pain, shortness of breath, focal weakness/numbness/tingling.   Disposition Plan & Communication   Status is: Inpatient  Inpatient status remains appropriate due to severity of illness requiring IV anticoagulation with heparin due to  diffuse arterial thromboses, presented with critical limb ischemia.  Dispo: The patient is from: home              Anticipated d/c is to: home              Anticipated d/c date is: > 3 days              Patient currently is not medically stable for d/c.   Family Communication: significant other at bedside on rounds today   Consults,  Procedures, Significant Events   Consultants:   Vascular surgery  Procedures:   Surgical embolectomy of Right brachial, axillary, and subclavian, radial and ulnar artieral on 11/20, see Op Note for details.  Antimicrobials:  Anti-infectives (From admission, onward)   Start     Dose/Rate Route Frequency Ordered Stop   03/09/20 2313  ceFAZolin (ANCEF) IVPB 2g/100 mL premix        2 g 200 mL/hr over 30 Minutes Intravenous 30 min pre-op 03/09/20 2313 03/09/20 2340       Significant Events: --surgery 11/20   Objective   Vitals:   03/10/20 0630 03/10/20 0645 03/10/20 0700 03/10/20 0731  BP: 100/71  99/68 98/74  Pulse: 74 60 76 71  Resp: 15 12 (!) 24 13  Temp:  (!) 97 F (36.1 C)    TempSrc:      SpO2: 97% 100% 98% 95%  Weight:      Height:        Intake/Output Summary (Last 24 hours) at 03/10/2020 0743 Last data filed at 03/10/2020 0644 Gross per 24 hour  Intake 640 ml  Output 375 ml  Net 265 ml   Filed Weights   03/09/20 1952  Weight: 61.2 kg    Physical Exam:  General exam: awake, alert, no acute distress Respiratory system: CTAB with good aeration, intermittent expiratory wheezes, no rhonchi, normal respiratory effort. Cardiovascular system: normal S1/S2, RRR, no pedal edema.   Gastrointestinal system: soft, NT, ND, no HSM felt, +bowel sounds. Central nervous system: A&O x4. no gross focal neurologic deficits, normal speech Extremities: right upper extremity normal temp and color, no bleeding from surgical site on forearm, R radial pulse palpable Psychiatry: normal mood, congruent affect, judgement and insight appear normal  Labs   Data Reviewed: I have personally reviewed following labs and imaging studies  CBC: Recent Labs  Lab 03/09/20 1956 03/10/20 0540  WBC 22.9* 19.4*  HGB 15.4* 12.9  HCT 46.7* 40.1  MCV 80.9 81.7  PLT 342 440   Basic Metabolic Panel: Recent Labs  Lab 03/09/20 1956 03/10/20 0540  NA 133* 135  K 3.9 3.7  CL 100 103    CO2 24 23  GLUCOSE 120* 100*  BUN 12 10  CREATININE 0.88 0.78  CALCIUM 8.7* 8.2*   GFR: Estimated Creatinine Clearance: 87.6 mL/min (by C-G formula based on SCr of 0.78 mg/dL). Liver Function Tests: Recent Labs  Lab 03/09/20 1956  AST 72*  ALT 40  ALKPHOS 76  BILITOT 0.9  PROT 7.2  ALBUMIN 3.3*   Recent Labs  Lab 03/09/20 1956  LIPASE 20   No results for input(s): AMMONIA in the last 168 hours. Coagulation Profile: Recent Labs  Lab 03/09/20 2147  INR 1.1   Cardiac Enzymes: No results for input(s): CKTOTAL, CKMB, CKMBINDEX, TROPONINI in the last 168 hours. BNP (last 3 results) No results for input(s): PROBNP in the last 8760 hours. HbA1C: No results for input(s): HGBA1C in the last 72 hours. CBG: No  results for input(s): GLUCAP in the last 168 hours. Lipid Profile: No results for input(s): CHOL, HDL, LDLCALC, TRIG, CHOLHDL, LDLDIRECT in the last 72 hours. Thyroid Function Tests: No results for input(s): TSH, T4TOTAL, FREET4, T3FREE, THYROIDAB in the last 72 hours. Anemia Panel: No results for input(s): VITAMINB12, FOLATE, FERRITIN, TIBC, IRON, RETICCTPCT in the last 72 hours. Sepsis Labs: No results for input(s): PROCALCITON, LATICACIDVEN in the last 168 hours.  Recent Results (from the past 240 hour(s))  Resp Panel by RT-PCR (Flu A&B, Covid) Nasopharyngeal Swab     Status: None   Collection Time: 03/09/20 10:42 PM   Specimen: Nasopharyngeal Swab; Nasopharyngeal(NP) swabs in vial transport medium  Result Value Ref Range Status   SARS Coronavirus 2 by RT PCR NEGATIVE NEGATIVE Final    Comment: (NOTE) SARS-CoV-2 target nucleic acids are NOT DETECTED.  The SARS-CoV-2 RNA is generally detectable in upper respiratory specimens during the acute phase of infection. The lowest concentration of SARS-CoV-2 viral copies this assay can detect is 138 copies/mL. A negative result does not preclude SARS-Cov-2 infection and should not be used as the sole basis for  treatment or other patient management decisions. A negative result may occur with  improper specimen collection/handling, submission of specimen other than nasopharyngeal swab, presence of viral mutation(s) within the areas targeted by this assay, and inadequate number of viral copies(<138 copies/mL). A negative result must be combined with clinical observations, patient history, and epidemiological information. The expected result is Negative.  Fact Sheet for Patients:  EntrepreneurPulse.com.au  Fact Sheet for Healthcare Providers:  IncredibleEmployment.be  This test is no t yet approved or cleared by the [place] FDA and  has been authorized for detection and/or diagnosis of SARS-CoV-2 by FDA under an Emergency Use Authorization (EUA). This EUA will remain  in effect (meaning this test can be used) for the duration of the COVID-19 declaration under Section 564(b)(1) of the Act, 21 U.S.C.section 360bbb-3(b)(1), unless the authorization is terminated  or revoked sooner.       Influenza A by PCR NEGATIVE NEGATIVE Final   Influenza B by PCR NEGATIVE NEGATIVE Final    Comment: (NOTE) The Xpert Xpress SARS-CoV-2/FLU/RSV plus assay is intended as an aid in the diagnosis of influenza from Nasopharyngeal swab specimens and should not be used as a sole basis for treatment. Nasal washings and aspirates are unacceptable for Xpert Xpress SARS-CoV-2/FLU/RSV testing.  Fact Sheet for Patients: EntrepreneurPulse.com.au  Fact Sheet for Healthcare Providers: IncredibleEmployment.be  This test is not yet approved or cleared by the [place] FDA and has been authorized for detection and/or diagnosis of SARS-CoV-2 by FDA under an Emergency Use Authorization (EUA). This EUA will remain in effect (meaning this test can be used) for the duration of the COVID-19 declaration under Section 564(b)(1) of the Act, 21  U.S.C. section 360bbb-3(b)(1), unless the authorization is terminated or revoked.  Performed at [facility], [place], [place] [address]       Imaging Studies   [place] UPPER EXTREMITY DUPLEX RIGHT (NON-WBI)  Result Date: 03/09/2020 CLINICAL DATA:  Cold painful right hand and wrist EXAM: RIGHT UPPER EXTREMITY ARTERIAL DUPLEX SCAN TECHNIQUE: Gray-scale sonography as well as color Doppler and duplex ultrasound was performed to evaluate the arteries of the upper extremity. COMPARISON:  None. FINDINGS: Nonocclusive thrombus is seen within the right subclavian artery. Right axillary artery, brachial artery and proximal radial artery are patent. Occlusive thrombus seen in the distal radial artery and in the mid to distal  ulnar artery. IMPRESSION: Occlusive thrombus within the radial artery and ulnar artery at the right wrist. Nonocclusive thrombus within the subclavian artery. This could be a source of emboli. These results were called by telephone at the time of interpretation on 03/09/2020 at 10:34 pm to provider [facility] , who verbally acknowledged these results. Electronically Signed   By: Rolm Baptise M.D.   On: 03/09/2020 22:34   CT Angio Chest/Abd/Pel for Dissection W and/or Wo Contrast  Addendum Date: 03/10/2020   ADDENDUM REPORT: 03/10/2020 05:09 ADDENDUM: These results will be called to the ordering clinician or representative by the Radiology Department at the imaging location. Electronically Signed   By: Margarette Canada M.D.   On: 03/10/2020 05:09   Result Date: 03/10/2020 CLINICAL DATA:  44 year old female with chest and abdominal pain for 3 days. History of prior aortic thrombus and renal infarcts. EXAM: CT ANGIOGRAPHY CHEST, ABDOMEN AND PELVIS TECHNIQUE: Non-contrast CT of the chest was initially obtained. Multidetector CT imaging through the chest, abdomen and pelvis was performed using the standard protocol during bolus administration of intravenous contrast.  Multiplanar reconstructed images and MIPs were obtained and reviewed to evaluate the vascular anatomy. CONTRAST:  100 cc OMNIPAQUE IOHEXOL 350 MG/ML SOLN COMPARISON:  12/21/2018 and prior CTs FINDINGS: CTA CHEST FINDINGS Cardiovascular: Heart size is normal. Increasing thrombus within the anterior aspect of the descending thoracic colon noted since 12/21/2018 with increasing irregular thrombus projecting into the aortic lumen. A moderate to large amount of nonocclusive thrombus within the innominate and RIGHT subclavian arteries is now identified There is no evidence of thoracic aortic aneurysm or large central pulmonary emboli. No pericardial effusion is identified. Mediastinum/Nodes: No enlarged mediastinal, hilar, or axillary lymph nodes. Thyroid gland, trachea, and esophagus demonstrate no significant findings. Lungs/Pleura: There is no evidence of airspace disease, consolidation, mass, nodule, pleural effusion or pneumothorax. Scattered blebs along both lung apices are again noted. Musculoskeletal: No acute or suspicious bony abnormality. Review of the MIP images confirms the above findings. CTA ABDOMEN AND PELVIS FINDINGS VASCULAR Aorta: Increasing moderate thrombus along the anterior/LEFT aspect of the infrarenal abdominal aorta noted without evidence of aneurysm. Celiac: No significant abnormality. SMA: No significant abnormality Renals: The visualized portions of the renal arteries are unremarkable. IMA: Not opacified. Veins: No definite venous abnormality identified. Review of the MIP images confirms the above findings. NON-VASCULAR Hepatobiliary: No liver or gallbladder abnormalities are identified. Pancreas: A 7 mm cystic lesion within the anterior body is unchanged from 2016. No other significant abnormalities. Spleen: Remote splenic infarcts are again noted. There is an equivocal acute infarct within the posteromedial spleen (series 6: Image [id number]). Adrenals/Urinary Tract: Multiple new wedge-shaped  hypodense areas within the mid in UPPER RIGHT kidney are noted compatible with multiple new infarcts. Remote LEFT infarcts are again identified. The adrenal glands and bladder are unremarkable. Stomach/Bowel: There is equivocal mild circumferential wall thickening of several small bowel loops within the central abdomen and pelvis. There is no evidence of pneumatosis intestinalis, bowel dilatation, bowel inflammatory changes or other areas of definite bowel wall thickening. The appendix is normal. Lymphatic: No abnormal appearing lymph nodes identified. Reproductive: Uterus and bilateral adnexa are unremarkable. Other: No ascites, pneumoperitoneum or focal collection. Musculoskeletal: No acute or suspicious bony abnormalities identified. Review of the MIP images confirms the above findings. IMPRESSION: 1. Multiple new infarcts within the mid and upper RIGHT kidney. 2. New moderate to large amount of nonocclusive thrombus within the innominate and RIGHT subclavian arteries. 3. Increasing thrombus within the  thoracic and abdominal aorta. Increasing irregular thrombus within the descending thoracic aortic projecting into the aortic lumen. 4. Nonvisualization/occlusion of the internal mesenteric artery. Equivocal mild circumferential wall thickening of several small bowel loops within the central abdomen and pelvis - question enteritis versus ischemia. No pneumoperitoneum, portal venous gas or pneumatosis intestinalis. 5. Possible small acute splenic infarct. Remote splenic and renal infarcts again noted. 6. Unchanged 7 mm cystic lesion within the anterior body of the pancreas. Consider 1 year follow-up MRI or CT follow-up. 7. Emphysema (ICD10-J43.9). Electronically Signed: By: Margarette Canada M.D. On: 03/10/2020 04:23     Medications   Scheduled Meds: . [MAR Hold] nicotine  21 mg Transdermal Daily  . oxyCODONE      . promethazine  12.5 mg Intravenous Once   Continuous Infusions: . sodium chloride    . sodium  chloride 100 mL/hr at 03/10/20 0645  . acetaminophen    . acetaminophen Stopped (03/10/20 0120)  . heparin         LOS: 1 day    Time spent: 30 minutes with > 50% spent in coordination of care and direct patient contact.    Ezekiel Slocumb, DO [facility]  03/10/2020, 7:43 AM    If 7PM-7AM, please contact night-coverage. How to contact the [facility] Attending or Consulting provider [place] or covering provider during after hours [place], for this patient?    1. Check the care team in [facility] and look for a) attending/consulting [facility] provider listed and b) the [facility] team listed 2. Log into [email or web address] and use [address]'s universal password to access. If you do not have the password, please contact the hospital operator. 3. Locate the [facility] provider you are looking for under [facility] and page to a number that you can be directly reached. 4. If you still have difficulty reaching the provider, please page the [facility] (Director on Call) for the Hospitalists listed on amion for assistance.

## 2020-03-10 NOTE — Progress Notes (Signed)
This writer was not notified until 0720 this am of STAT EKG that was due from 03/09/2020 2158.

## 2020-03-10 NOTE — Plan of Care (Signed)
  Problem: Health Behavior/Discharge Planning: Goal: Ability to manage health-related needs will improve Outcome: Progressing   

## 2020-03-10 NOTE — Consult Note (Signed)
ANTICOAGULATION CONSULT NOTE  Pharmacy Consult for heparin infusion Indication: DVT  No Known Allergies  Patient Measurements: Height: 5\' 7"  (170.2 cm) Weight: 54.7 kg (120 lb 9.6 oz) IBW/kg (Calculated) : 61.6  Vital Signs: Temp: 97.8 F (36.6 C) (11/20 1529) Temp Source: Oral (11/20 1147) BP: 143/86 (11/20 1529) Pulse Rate: 62 (11/20 1529)  Labs: Recent Labs    03/09/20 1956 03/09/20 2147 03/10/20 0540 03/10/20 1519  HGB 15.4*  --  12.9  --   HCT 46.7*  --  40.1  --   PLT 342  --  280  --   APTT  --  28 157*  --   LABPROT  --  13.8  --   --   INR  --  1.1  --   --   HEPARINUNFRC  --   --  0.74* 0.32  CREATININE 0.88  --  0.78  --     Estimated Creatinine Clearance: 78.3 mL/min (by C-G formula based on SCr of 0.78 mg/dL).   Medical History: Past Medical History:  Diagnosis Date  . Aortic thrombus ([place])   . CRPS (complex regional pain syndrome type I) 12/2019  . 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 femalewho presents to the ED for evaluation of abdominal pain. 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  Medical admission 1 year ago at our facility where she was transitioned to Eliquis from warfarin. Her last dose of apixaban is unable to be determined at this time. She was admitted in September 2020 and was on a heparin infusion for an aortic clot. Data from that admission was used to guide initial heparin infusion rate.  Goal of Therapy:  aPTT 66 - 102 seconds Monitor platelets by anticoagulation protocol: Yes   Plan:  03/10/20 @ 1512 HL 0.32 therapeutic - continue heparin at 1200 units/hr.  Will order heparin level in 6 hours and CBC with AM labs.   Benn Moulder, PharmD Pharmacy Resident  03/10/2020 4:01 PM

## 2020-03-10 NOTE — Consult Note (Signed)
ANTICOAGULATION CONSULT NOTE  Pharmacy Consult for heparin infusion Indication: DVT  No Known Allergies  Patient Measurements: Height: 5\' 7"  (170.2 cm) Weight: 54.7 kg (120 lb 9.6 oz) IBW/kg (Calculated) : 61.6  HEPARIN DW (KG): 54.7   Vital Signs: Temp: 98.8 F (37.1 C) (11/20 2025) Temp Source: Oral (11/20 2025) BP: 143/92 (11/20 2025) Pulse Rate: 72 (11/20 2025)  Labs: Recent Labs    03/09/20 1956 03/09/20 2147 03/10/20 0540 03/10/20 1519 03/10/20 2127  HGB 15.4*  --  12.9  --   --   HCT 46.7*  --  40.1  --   --   PLT 342  --  280  --   --   APTT  --  28 157*  --   --   LABPROT  --  13.8  --   --   --   INR  --  1.1  --   --   --   HEPARINUNFRC  --   --  0.74* 0.32 0.22*  CREATININE 0.88  --  0.78  --   --     Estimated Creatinine Clearance: 78.3 mL/min (by C-G formula based on SCr of 0.78 mg/dL).   Medical History: Past Medical History:  Diagnosis Date  . Aortic thrombus ([place])   . CRPS (complex regional pain syndrome type I) 12/2019  . 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 female who presents to the ED for evaluation of abdominal pain. 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  Medical admission 1 year ago at our facility where she was transitioned to Eliquis from warfarin. Her last dose of apixaban was over 1 month ago. She was admitted in September 2020 and was on a heparin infusion for an aortic clot. Data from that admission was used to guide initial heparin infusion rate.  This evening, heparin level is subtherapeutic at 0.22. Hgb and platelets WNL. No issues with the infusion or overt bleeding noted per RN.  Goal of Therapy:  Heparin level 0.3-0.7 units/ml Monitor platelets by anticoagulation protocol: Yes   Plan:  Heparin 800 units IV bolus x 1 Then increase heparin infusion to 1300 units/hr Obtain heparin level in 6 hours  Monitor CBC, daily heparin  level  Continue to monitor for signs/symptoms of bleeding F/u transition back to oral anticoagulant   [name], PharmD Clinical Pharmacist  03/10/2020   6:10 PM

## 2020-03-10 NOTE — Op Note (Signed)
[facility]   OPERATIVE NOTE  DATE: 03/09/2020 - 03/10/2020  PRE-OPERATIVE DIAGNOSIS: Ischemic right upper extremity  POST-OPERATIVE DIAGNOSIS: same as above  PROCEDURE: 1.   Right brachial, axillary, and subclavian artery embolectomy with 3 Fogarty embolectomy balloon. 2.  Right radial and ulnar embolectomy with 2 and 3 Fogarty embolectomy balloons  SURGEON: [name]  ASSISTANT(S): None  ANESTHESIA: General  ESTIMATED BLOOD LOSS: 150 cc  FINDING(S): 1.  Large amount of thrombus retrieved from the axillary/subclavian artery with restoration of inflow. 2.  Moderate to large amount of thrombus retrieved distally from the radial as well as what appeared to be the ulnar artery.   INDICATIONS:   Suzanne [name] is a 44 y.o. female who presents with an ischemic right upper extremity with duplex showing thrombosis of the radial and ulnar arteries as well as thrombus within the subclavian and axillary arteries.  She has brought the operating room for embolectomy/thrombectomy.  Risks and benefits were discussed and informed consent was obtained.  DESCRIPTION: After obtaining full informed written consent, the patient was brought back to the operating room and placed supine upon the operating table.  The patient was prepped and draped in the standard fashion.  A small incision was made at the antecubital fossa dissecting out the brachial artery just above the radial and ulnar bifurcation.  The patient had a continuous heparin drip that continued throughout the procedure.  A small transverse incision was created with an 11 blade and extended with Potts scissors.  A 3 Fogarty embolectomy balloon was passed 50 to 60 cm proximally and what would be the chest and a large amount of thrombus was returned with brisk inflow.  An additional pass was made with no additional thrombus retrieved and brisk inflow seen.  Initially, for the distal embolectomy I tried to use a 2 Fogarty  embolectomy balloon but it would not go down radial or ulnar arteries.  I then gently passed the 3 Fogarty embolectomy balloon first down the radial artery and retrieved a moderate to large amount of thrombus with return of backbleeding.  The 2 Fogarty embolectomy balloon was then passed and this went down to about 20 to 25 cm which would be at the wrist.  A very small amount of thrombus was retrieved so an additional pass was done with no further thrombus seen.  I then turned my attention to the ulnar artery.  We passed initially the tube it would not go well so upsized to the 3 Fogarty embolectomy balloon down the ulnar artery.  This went about 15 to 20 cm and a small to medium amount of thrombus was removed from the ulnar artery.  I then passed the 2 Fogarty embolectomy balloon to about 25 cm with no further thrombus removed from the ulnar artery.  There is also acceptable backbleeding from the distal side.  I then closed the arteriotomy with four 6-0 Prolene sutures in an interrupted fashion.  Control was released. At this point, there was an easily palpable radial pulse with release of control.  On release, a single 6-0 Prolene patch suture was used and then Surgicel and thrombin were used for hemostasis.  The wound was then closed with a 3-0 Vicryl and 4-0 Monocryl and Dermabond was placed to dressing. At this point, we elected to complete the procedure.  The patient was taken to the recovery room in stable condition.   COMPLICATIONS: None  CONDITION: Stable  [name]  03/10/2020, 12:24 AM  This note was created with Dragon Medical transcription system. Any errors in dictation are purely unintentional.

## 2020-03-10 NOTE — Anesthesia Postprocedure Evaluation (Signed)
Anesthesia Post Note  Patient: Suzanne [name]  Procedure(s) Performed: Brachial EMBOLECTOMY (Right Arm Upper)  Patient location during evaluation: PACU Anesthesia Type: General Level of consciousness: awake and alert Pain management: pain level controlled Vital Signs Assessment: post-procedure vital signs reviewed and stable Respiratory status: spontaneous breathing, nonlabored ventilation, respiratory function stable and patient connected to nasal cannula oxygen Cardiovascular status: blood pressure returned to baseline and stable Postop Assessment: no apparent nausea or vomiting Anesthetic complications: no   No complications documented.   Last Vitals:  Vitals:   03/10/20 0615 03/10/20 0630  BP:  100/71  Pulse: 68 74  Resp: 12 15  Temp:    SpO2: 93% 97%    Last Pain:  Vitals:   03/10/20 0630  TempSrc:   PainSc: Asleep                 Arita Miss

## 2020-03-10 NOTE — OR Nursing (Signed)
Was informed that patient could not go to med-surg, Had to go to progressive, [facility] informed so she could work on bed assignment.

## 2020-03-10 NOTE — Hospital Course (Addendum)
Suzanne [name] is a 44 y.o. 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  Patient reported having run out of her Eliquis 1 month ago.  Of note, has a history of prior noncompliance with warfarin for anticoagulation and continues to smoke.  Admitted to hospitalist service with vascular surgery consulted  Started on heparin drip.  Taken to OR with vascular surgery for immediate revascularization of the radial and ulnar arterial thromboses to prevent loss of the distal right upper extremity.

## 2020-03-11 ENCOUNTER — Encounter: Payer: Self-pay | Admitting: Vascular Surgery

## 2020-03-11 DIAGNOSIS — I749 Embolism and thrombosis of unspecified artery: Secondary | ICD-10-CM | POA: Diagnosis not present

## 2020-03-11 DIAGNOSIS — D735 Infarction of spleen: Secondary | ICD-10-CM | POA: Diagnosis not present

## 2020-03-11 LAB — CBC WITH DIFFERENTIAL/PLATELET
Abs Immature Granulocytes: 0.1 10*3/uL — ABNORMAL HIGH (ref 0.00–0.07)
Basophils Absolute: 0.1 10*3/uL (ref 0.0–0.1)
Basophils Relative: 0 %
Eosinophils Absolute: 0 10*3/uL (ref 0.0–0.5)
Eosinophils Relative: 0 %
HCT: 36.9 % (ref 36.0–46.0)
Hemoglobin: 11.8 g/dL — ABNORMAL LOW (ref 12.0–15.0)
Immature Granulocytes: 1 %
Lymphocytes Relative: 17 %
Lymphs Abs: 3.1 10*3/uL (ref 0.7–4.0)
MCH: 26.3 pg (ref 26.0–34.0)
MCHC: 32 g/dL (ref 30.0–36.0)
MCV: 82.2 fL (ref 80.0–100.0)
Monocytes Absolute: 1.6 10*3/uL — ABNORMAL HIGH (ref 0.1–1.0)
Monocytes Relative: 9 %
Neutro Abs: 12.9 10*3/uL — ABNORMAL HIGH (ref 1.7–7.7)
Neutrophils Relative %: 73 %
Platelets: 242 10*3/uL (ref 150–400)
RBC: 4.49 MIL/uL (ref 3.87–5.11)
RDW: 18.1 % — ABNORMAL HIGH (ref 11.5–15.5)
WBC: 17.8 10*3/uL — ABNORMAL HIGH (ref 4.0–10.5)
nRBC: 0 % (ref 0.0–0.2)

## 2020-03-11 LAB — CBC
HCT: 38.4 % (ref 36.0–46.0)
Hemoglobin: 12.3 g/dL (ref 12.0–15.0)
MCH: 26.1 pg (ref 26.0–34.0)
MCHC: 32 g/dL (ref 30.0–36.0)
MCV: 81.4 fL (ref 80.0–100.0)
Platelets: 234 10*3/uL (ref 150–400)
RBC: 4.72 MIL/uL (ref 3.87–5.11)
RDW: 18 % — ABNORMAL HIGH (ref 11.5–15.5)
WBC: 20.4 10*3/uL — ABNORMAL HIGH (ref 4.0–10.5)
nRBC: 0 % (ref 0.0–0.2)

## 2020-03-11 LAB — HEPARIN LEVEL (UNFRACTIONATED)
Heparin Unfractionated: 0.27 IU/mL — ABNORMAL LOW (ref 0.30–0.70)
Heparin Unfractionated: 0.3 IU/mL (ref 0.30–0.70)
Heparin Unfractionated: 0.27 IU/mL — ABNORMAL LOW (ref 0.30–0.70)

## 2020-03-11 MED ORDER — HEPARIN BOLUS VIA INFUSION
1000.0000 [IU] | Freq: Once | INTRAVENOUS | Status: AC
  Administered 2020-03-11: 1000 [IU] via INTRAVENOUS
  Filled 2020-03-11: qty 1000

## 2020-03-11 NOTE — Consult Note (Signed)
ANTICOAGULATION CONSULT NOTE  Pharmacy Consult for heparin infusion Indication: DVT  No Known Allergies  Patient Measurements: Height: 5\' 7"  (170.2 cm) Weight: 54.7 kg (120 lb 9.6 oz) IBW/kg (Calculated) : 61.6  HEPARIN DW (KG): 54.7   Vital Signs: Temp: 98.1 F (36.7 C) (11/21 1147) Temp Source: Oral (11/21 1147) BP: 118/70 (11/21 1147) Pulse Rate: 67 (11/21 1147)  Labs: Recent Labs    03/09/20 1956 03/09/20 1956 03/09/20 2147 03/10/20 0540 03/10/20 0540 03/10/20 1519 03/10/20 2127 03/11/20 0451 03/11/20 1149  HGB 15.4*   < >  --  12.9   < >  --   --  12.3 11.8*  HCT 46.7*   < >  --  40.1  --   --   --  38.4 36.9  PLT 342   < >  --  280  --   --   --  234 242  APTT  --   --  28 157*  --   --   --   --   --   LABPROT  --   --  13.8  --   --   --   --   --   --   INR  --   --  1.1  --   --   --   --   --   --   HEPARINUNFRC  --   --   --  0.74*  --    < > 0.22* 0.27* 0.30  CREATININE 0.88  --   --  0.78  --   --   --   --   --    < > = values in this interval not displayed.    Estimated Creatinine Clearance: 78.3 mL/min (by C-G formula based on SCr of 0.78 mg/dL).   Medical History: Past Medical History:  Diagnosis Date  . Aortic thrombus ([place])   . CRPS (complex regional pain syndrome type I) 12/2019  . 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 female who presents to the ED for evaluation of abdominal pain. 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  Medical admission 1 year ago at our facility where she was transitioned to Eliquis from warfarin. Her last dose of apixaban was over 1 month ago. She was admitted in September 2020 and was on a heparin infusion for an aortic clot. Data from that admission was used to guide initial heparin infusion rate.  This evening, heparin level is subtherapeutic at 0.22. Hgb and platelets WNL. No issues with the infusion or overt bleeding  noted per RN.  11/21 1149 HL 0.3   Goal of Therapy:  Heparin level 0.3-0.7 units/ml Monitor platelets by anticoagulation protocol: Yes   Plan:  Heparin level is therapeutic, but on the lower end of goal. Will increase drip rate to 1500 units/hr. Recheck heparin level in 6 hours and CBC daily while on heparin.   Oswald Hillock Clinical Pharmacist  03/10/2020   6:10 PM

## 2020-03-11 NOTE — Consult Note (Signed)
ANTICOAGULATION CONSULT NOTE  Pharmacy Consult for heparin infusion Indication: DVT  No Known Allergies  Patient Measurements: Height: 5\' 7"  (170.2 cm) Weight: 54.7 kg (120 lb 9.6 oz) IBW/kg (Calculated) : 61.6  HEPARIN DW (KG): 54.7   Vital Signs: Temp: 98.7 F (37.1 C) (11/21 0429) Temp Source: Oral (11/21 0429) BP: 153/87 (11/21 0429) Pulse Rate: 68 (11/21 0429)  Labs: Recent Labs    03/09/20 1956 03/09/20 1956 03/09/20 2147 03/10/20 0540 03/10/20 0540 03/10/20 1519 03/10/20 2127 03/11/20 0451  HGB 15.4*   < >  --  12.9  --   --   --  12.3  HCT 46.7*  --   --  40.1  --   --   --  38.4  PLT 342  --   --  280  --   --   --  234  APTT  --   --  28 157*  --   --   --   --   LABPROT  --   --  13.8  --   --   --   --   --   INR  --   --  1.1  --   --   --   --   --   HEPARINUNFRC  --   --   --  0.74*   < > 0.32 0.22* 0.27*  CREATININE 0.88  --   --  0.78  --   --   --   --    < > = values in this interval not displayed.    Estimated Creatinine Clearance: 78.3 mL/min (by C-G formula based on SCr of 0.78 mg/dL).   Medical History: Past Medical History:  Diagnosis Date  . Aortic thrombus ([place])   . CRPS (complex regional pain syndrome type I) 12/2019  . 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 female who presents to the ED for evaluation of abdominal pain. 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  Medical admission 1 year ago at our facility where she was transitioned to Eliquis from warfarin. Her last dose of apixaban was over 1 month ago. She was admitted in September 2020 and was on a heparin infusion for an aortic clot. Data from that admission was used to guide initial heparin infusion rate.  This evening, heparin level is subtherapeutic at 0.22. Hgb and platelets WNL. No issues with the infusion or overt bleeding noted per RN.  Goal of Therapy:  Heparin level 0.3-0.7  units/ml Monitor platelets by anticoagulation protocol: Yes   Plan:  11/21:  HL @ 0451 = 0.27 Will order Heparin 1000 units IV X 1 and increase drip rate to 1400 units/hr.  Will recheck HL 6 hrs after rate change.    Cherell Colvin D Clinical Pharmacist  03/10/2020   6:10 PM

## 2020-03-11 NOTE — Consult Note (Signed)
ANTICOAGULATION CONSULT NOTE  Pharmacy Consult for heparin infusion Indication: DVT  Patient Measurements: HEPARIN DW (KG): 54.7   Labs: Recent Labs    03/09/20 1956 03/09/20 1956 03/09/20 2147 03/10/20 0540 03/10/20 1519 03/11/20 0451 03/11/20 1149 03/11/20 1858  HGB 15.4*   < >  --  12.9  --  12.3 11.8*  --   HCT 46.7*   < >  --  40.1  --  38.4 36.9  --   PLT 342   < >  --  280  --  234 242  --   APTT  --   --  28 157*  --   --   --   --   LABPROT  --   --  13.8  --   --   --   --   --   INR  --   --  1.1  --   --   --   --   --   HEPARINUNFRC  --   --   --  0.74*   < > 0.27* 0.30 0.27*  CREATININE 0.88  --   --  0.78  --   --   --   --    < > = values in this interval not displayed.    Estimated Creatinine Clearance: 78.3 mL/min (by C-G formula based on SCr of 0.78 mg/dL).   Medical History: Past Medical History:  Diagnosis Date  . Aortic thrombus ([place])   . CRPS (complex regional pain syndrome type I) 12/2019  . 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 female who presents to the ED for evaluation of abdominal pain. 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  Medical admission 1 year ago at our facility where she was transitioned to Eliquis from warfarin. Her last dose of apixaban was over 1 month ago. She was admitted in September 2020 and was on a heparin infusion for an aortic clot. Data from that admission was used to guide initial heparin infusion rate.  Goal of Therapy:  Heparin level 0.3-0.7 units/ml Monitor platelets by anticoagulation protocol: Yes   Plan:  --11/21 at 1858 HL = 0.27, slightly subtherapeutic. Will increase heparin drip rate to 1600 units/hr --Re-check HL 6 hours after rate change --Daily CBC per protocol  Benita Gutter  03/10/2020   6:10 PM

## 2020-03-11 NOTE — Progress Notes (Signed)
PROGRESS NOTE    [name]   RXV:[id number]  DOB: 1976/02/16  PCP: System, Provider Not In    DOA: 03/09/2020 LOS: 2   Brief Narrative   Suzanne [name] is a 44 y.o. 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  Patient reported having run out of her Eliquis 1 month ago.  Of note, has a history of prior noncompliance with warfarin for anticoagulation and continues to smoke.  Admitted to hospitalist service with vascular surgery consulted   On heparin drip.  Taken to OR with vascular surgery for immediate revascularization of the radial and ulnar arterial thromboses to prevent loss of the distal right upper extremity.     Assessment & Plan   Principal Problem:   Arterial thrombosis (HCC) Active Problems:   Splenic infarction   Tobacco dependence   Ischemic right upper extremity secondary to arterial thrombosis of the right radial and ulnar arteries -also has right subclavian and innominate arterial thromboses.  Hypercoagulable history as outlined above with ongoing nicotine use and noncompliance with anticoagulation. --Vascular surgery following, taken to the OR for immediate embolectomy of the radial and ulnar occlusive thrombi to prevent loss of the distal right upper extremity --Continue heparin drip  Multiple arterial thrombi -involving right upper extremity as above, right kidney, IMA, thoracic and abdominal aorta, small acute splenic infarct and multiple chronic splenic infarcts.  On heparin as above with vascular following. --Noncompliant with Eliquis, ran out a month prior to presentation  Abdominal pain -likely secondary to recurrent or progressive thrombosis involving  spleen and IMA.  Vascular surgery feels these are best managed with anticoagulation as above.  Pain control as needed per orders.  Nicotine dependence -patient counseled extensively on smoking cessation and increased risk of blood clotting with ongoing use.  Nicotine patch ordered.  Hypertensive urgency -on admission patient's BP was uncontrolled as high as 191/106.  Now controlled.  Suspect due to severe pain, and question noncompliance with BP meds, nicotine use.  Patient is on antihypertensives as outpatient.  As needed hydralazine for now.    Leukocytosis - suspect reactive in setting of diffuse thrombi.  No evidence of symptoms of infection.  Monitor CBC and for clinical s/sx's of infection.   Patient BMI: Body mass index is 18.89 kg/m.   DVT prophylaxis: on heparin drip   Diet:  Diet Orders (From admission, onward)    Start     Ordered   03/10/20 0606  Diet regular Room service appropriate? Yes; Fluid consistency: Thin  Diet effective now       Question Answer Comment  Room service appropriate? Yes   Fluid consistency: Thin      03/10/20 0605            Code Status: Full Code    Subjective 03/11/20    Pt seen this AM at bedside.  She was sleeping but responds to voice.  Has some mild swelling in R forearm with ice pack in place.  No tenderness, pain or changes in temp of the R hand.  No abdominal pain.  No other complaints.    Disposition Plan & Communication   Status is: Inpatient  Inpatient status remains appropriate due to severity of illness  requiring IV anticoagulation with heparin due to diffuse arterial thromboses, presented with critical limb ischemia.  Dispo: The patient is from: home              Anticipated d/c is to: home              Anticipated d/c date is: 3 days pending clearance by vascular surgery              Patient currently is not medically stable for d/c.   Family Communication: none at bedside on rounds today, will attempt to  call   Consults, Procedures, Significant Events   Consultants:   Vascular surgery  Procedures:   Surgical embolectomy of Right brachial, axillary, and subclavian, radial and ulnar artieral on 11/20, see Op Note for details.  Antimicrobials:  Anti-infectives (From admission, onward)   Start     Dose/Rate Route Frequency Ordered Stop   03/09/20 2313  ceFAZolin (ANCEF) IVPB 2g/100 mL premix        2 g 200 mL/hr over 30 Minutes Intravenous 30 min pre-op 03/09/20 2313 03/09/20 2340       Significant Events: --surgery 11/20   Objective   Vitals:   03/10/20 1754 03/10/20 2025 03/11/20 0429 03/11/20 0757  BP: (!) 160/92 (!) 143/92 (!) 153/87 131/83  Pulse: 70 72 68 68  Resp:  15 16 18   Temp:  98.8 F (37.1 C) 98.7 F (37.1 C) 98.2 F (36.8 C)  TempSrc:  Oral Oral Oral  SpO2:  99% 98% 96%  Weight:   54.7 kg   Height:        Intake/Output Summary (Last 24 hours) at 03/11/2020 1103 Last data filed at 03/11/2020 1031 Gross per 24 hour  Intake 1225.35 ml  Output 200 ml  Net 1025.35 ml   Filed Weights   03/09/20 1952 03/10/20 1147 03/11/20 0429  Weight: 61.2 kg 54.7 kg 54.7 kg    Physical Exam:  General exam: awake, alert, no acute distress Respiratory system: CTAB with good aeration, normal respiratory effort.  On room air. Cardiovascular system: normal S1/S2, RRR, no pedal edema.   Gastrointestinal system: soft, no tenderness or distention, +bowel sounds.  No rebound tenderness. Central nervous system: A&O x4. no gross focal neurologic deficits, normal speech Extremities: right upper extremity normal temp and color, surgical site on forearm intact without bleeding or surrounding warmth erythema or inducation, mild R forearm swelling but no tenderness   Labs   Data Reviewed: I have personally reviewed following labs and imaging studies  CBC: Recent Labs  Lab 03/09/20 1956 03/10/20 0540 03/11/20 0451  WBC 22.9* 19.4* 20.4*  HGB 15.4* 12.9 12.3  HCT 46.7*  40.1 38.4  MCV 80.9 81.7 81.4  PLT 342 280 710   Basic Metabolic Panel: Recent Labs  Lab 03/09/20 1956 03/10/20 0540  NA 133* 135  K 3.9 3.7  CL 100 103  CO2 24 23  GLUCOSE 120* 100*  BUN 12 10  CREATININE 0.88 0.78  CALCIUM 8.7* 8.2*   GFR: Estimated Creatinine Clearance: 78.3 mL/min (by C-G formula based on SCr of 0.78 mg/dL). Liver Function Tests: Recent Labs  Lab 03/09/20 1956  AST 72*  ALT 40  ALKPHOS 76  BILITOT 0.9  PROT 7.2  ALBUMIN 3.3*   Recent Labs  Lab 03/09/20 1956  LIPASE 20   No results for input(s): AMMONIA in the last 168 hours. Coagulation Profile: Recent Labs  Lab 03/09/20 2147  INR 1.1   Cardiac Enzymes: No results  for input(s): CKTOTAL, CKMB, CKMBINDEX, TROPONINI in the last 168 hours. BNP (last 3 results) No results for input(s): PROBNP in the last 8760 hours. HbA1C: No results for input(s): HGBA1C in the last 72 hours. CBG: No results for input(s): GLUCAP in the last 168 hours. Lipid Profile: No results for input(s): CHOL, HDL, LDLCALC, TRIG, CHOLHDL, LDLDIRECT in the last 72 hours. Thyroid Function Tests: No results for input(s): TSH, T4TOTAL, FREET4, T3FREE, THYROIDAB in the last 72 hours. Anemia Panel: No results for input(s): VITAMINB12, FOLATE, FERRITIN, TIBC, IRON, RETICCTPCT in the last 72 hours. Sepsis Labs: No results for input(s): PROCALCITON, LATICACIDVEN in the last 168 hours.  Recent Results (from the past 240 hour(s))  Resp Panel by RT-PCR (Flu A&B, Covid) Nasopharyngeal Swab     Status: None   Collection Time: 03/09/20 10:42 PM   Specimen: Nasopharyngeal Swab; Nasopharyngeal(NP) swabs in vial transport medium  Result Value Ref Range Status   SARS Coronavirus 2 by RT PCR NEGATIVE NEGATIVE Final    Comment: (NOTE) SARS-CoV-2 target nucleic acids are NOT DETECTED.  The SARS-CoV-2 RNA is generally detectable in upper respiratory specimens during the acute phase of infection. The lowest concentration of SARS-CoV-2  viral copies this assay can detect is 138 copies/mL. A negative result does not preclude SARS-Cov-2 infection and should not be used as the sole basis for treatment or other patient management decisions. A negative result may occur with  improper specimen collection/handling, submission of specimen other than nasopharyngeal swab, presence of viral mutation(s) within the areas targeted by this assay, and inadequate number of viral copies(<138 copies/mL). A negative result must be combined with clinical observations, patient history, and epidemiological information. The expected result is Negative.  Fact Sheet for Patients:  EntrepreneurPulse.com.au  Fact Sheet for Healthcare Providers:  IncredibleEmployment.be  This test is no t yet approved or cleared by the [place] FDA and  has been authorized for detection and/or diagnosis of SARS-CoV-2 by FDA under an Emergency Use Authorization (EUA). This EUA will remain  in effect (meaning this test can be used) for the duration of the COVID-19 declaration under Section 564(b)(1) of the Act, 21 U.S.C.section 360bbb-3(b)(1), unless the authorization is terminated  or revoked sooner.       Influenza A by PCR NEGATIVE NEGATIVE Final   Influenza B by PCR NEGATIVE NEGATIVE Final    Comment: (NOTE) The Xpert Xpress SARS-CoV-2/FLU/RSV plus assay is intended as an aid in the diagnosis of influenza from Nasopharyngeal swab specimens and should not be used as a sole basis for treatment. Nasal washings and aspirates are unacceptable for Xpert Xpress SARS-CoV-2/FLU/RSV testing.  Fact Sheet for Patients: EntrepreneurPulse.com.au  Fact Sheet for Healthcare Providers: IncredibleEmployment.be  This test is not yet approved or cleared by the [place] FDA and has been authorized for detection and/or diagnosis of SARS-CoV-2 by FDA under an Emergency Use Authorization  (EUA). This EUA will remain in effect (meaning this test can be used) for the duration of the COVID-19 declaration under Section 564(b)(1) of the Act, 21 U.S.C. section 360bbb-3(b)(1), unless the authorization is terminated or revoked.  Performed at [facility], [place], [place] [id number]       Imaging Studies   [place] UPPER EXTREMITY DUPLEX RIGHT (NON-WBI)  Result Date: 03/09/2020 CLINICAL DATA:  Cold painful right hand and wrist EXAM: RIGHT UPPER EXTREMITY ARTERIAL DUPLEX SCAN TECHNIQUE: Gray-scale sonography as well as color Doppler and duplex ultrasound was performed to evaluate the arteries of the upper extremity.  COMPARISON:  None. FINDINGS: Nonocclusive thrombus is seen within the right subclavian artery. Right axillary artery, brachial artery and proximal radial artery are patent. Occlusive thrombus seen in the distal radial artery and in the mid to distal ulnar artery. IMPRESSION: Occlusive thrombus within the radial artery and ulnar artery at the right wrist. Nonocclusive thrombus within the subclavian artery. This could be a source of emboli. These results were called by telephone at the time of interpretation on 03/09/2020 at 10:34 pm to provider [facility] , who verbally acknowledged these results. Electronically Signed   By: Rolm Baptise M.D.   On: 03/09/2020 22:34   CT Angio Chest/Abd/Pel for Dissection W and/or Wo Contrast  Addendum Date: 03/10/2020   ADDENDUM REPORT: 03/10/2020 05:09 ADDENDUM: These results will be called to the ordering clinician or representative by the Radiology Department at the imaging location. Electronically Signed   By: Margarette Canada M.D.   On: 03/10/2020 05:09   Result Date: 03/10/2020 CLINICAL DATA:  44 year old female with chest and abdominal pain for 3 days. History of prior aortic thrombus and renal infarcts. EXAM: CT ANGIOGRAPHY CHEST, ABDOMEN AND PELVIS TECHNIQUE: Non-contrast CT of the chest was initially obtained.  Multidetector CT imaging through the chest, abdomen and pelvis was performed using the standard protocol during bolus administration of intravenous contrast. Multiplanar reconstructed images and MIPs were obtained and reviewed to evaluate the vascular anatomy. CONTRAST:  100 cc OMNIPAQUE IOHEXOL 350 MG/ML SOLN COMPARISON:  12/21/2018 and prior CTs FINDINGS: CTA CHEST FINDINGS Cardiovascular: Heart size is normal. Increasing thrombus within the anterior aspect of the descending thoracic colon noted since 12/21/2018 with increasing irregular thrombus projecting into the aortic lumen. A moderate to large amount of nonocclusive thrombus within the innominate and RIGHT subclavian arteries is now identified There is no evidence of thoracic aortic aneurysm or large central pulmonary emboli. No pericardial effusion is identified. Mediastinum/Nodes: No enlarged mediastinal, hilar, or axillary lymph nodes. Thyroid gland, trachea, and esophagus demonstrate no significant findings. Lungs/Pleura: There is no evidence of airspace disease, consolidation, mass, nodule, pleural effusion or pneumothorax. Scattered blebs along both lung apices are again noted. Musculoskeletal: No acute or suspicious bony abnormality. Review of the MIP images confirms the above findings. CTA ABDOMEN AND PELVIS FINDINGS VASCULAR Aorta: Increasing moderate thrombus along the anterior/LEFT aspect of the infrarenal abdominal aorta noted without evidence of aneurysm. Celiac: No significant abnormality. SMA: No significant abnormality Renals: The visualized portions of the renal arteries are unremarkable. IMA: Not opacified. Veins: No definite venous abnormality identified. Review of the MIP images confirms the above findings. NON-VASCULAR Hepatobiliary: No liver or gallbladder abnormalities are identified. Pancreas: A 7 mm cystic lesion within the anterior body is unchanged from 2016. No other significant abnormalities. Spleen: Remote splenic infarcts are  again noted. There is an equivocal acute infarct within the posteromedial spleen (series 6: Image [id number]). Adrenals/Urinary Tract: Multiple new wedge-shaped hypodense areas within the mid in UPPER RIGHT kidney are noted compatible with multiple new infarcts. Remote LEFT infarcts are again identified. The adrenal glands and bladder are unremarkable. Stomach/Bowel: There is equivocal mild circumferential wall thickening of several small bowel loops within the central abdomen and pelvis. There is no evidence of pneumatosis intestinalis, bowel dilatation, bowel inflammatory changes or other areas of definite bowel wall thickening. The appendix is normal. Lymphatic: No abnormal appearing lymph nodes identified. Reproductive: Uterus and bilateral adnexa are unremarkable. Other: No ascites, pneumoperitoneum or focal collection. Musculoskeletal: No acute or suspicious bony abnormalities identified. Review of the MIP  images confirms the above findings. IMPRESSION: 1. Multiple new infarcts within the mid and upper RIGHT kidney. 2. New moderate to large amount of nonocclusive thrombus within the innominate and RIGHT subclavian arteries. 3. Increasing thrombus within the thoracic and abdominal aorta. Increasing irregular thrombus within the descending thoracic aortic projecting into the aortic lumen. 4. Nonvisualization/occlusion of the internal mesenteric artery. Equivocal mild circumferential wall thickening of several small bowel loops within the central abdomen and pelvis - question enteritis versus ischemia. No pneumoperitoneum, portal venous gas or pneumatosis intestinalis. 5. Possible small acute splenic infarct. Remote splenic and renal infarcts again noted. 6. Unchanged 7 mm cystic lesion within the anterior body of the pancreas. Consider 1 year follow-up MRI or CT follow-up. 7. Emphysema (ICD10-J43.9). Electronically Signed: By: Margarette Canada M.D. On: 03/10/2020 04:23     Medications   Scheduled Meds: . nicotine   21 mg Transdermal Daily  . promethazine  12.5 mg Intravenous Once   Continuous Infusions: . sodium chloride 100 mL/hr at 03/11/20 0431  . heparin 1,400 Units/hr (03/11/20 0538)       LOS: 2 days    Time spent: 25 minutes with > 50% spent in coordination of care and direct patient contact.    Ezekiel Slocumb, DO [facility]  03/11/2020, 11:03 AM    If 7PM-7AM, please contact night-coverage. How to contact the [facility] Attending or Consulting provider [place] or covering provider during after hours [place], for this patient?    1. Check the care team in [facility] and look for a) attending/consulting [facility] provider listed and b) the [facility] team listed 2. Log into [email or web address] and use [place]'s universal password to access. If you do not have the password, please contact the hospital operator. 3. Locate the [facility] provider you are looking for under [facility] and page to a number that you can be directly reached. 4. If you still have difficulty reaching the provider, please page the [facility] (Director on Call) for the Hospitalists listed on amion for assistance.

## 2020-03-12 LAB — BASIC METABOLIC PANEL
Anion gap: 11 (ref 5–15)
BUN: 10 mg/dL (ref 6–20)
CO2: 22 mmol/L (ref 22–32)
Calcium: 7.5 mg/dL — ABNORMAL LOW (ref 8.9–10.3)
Chloride: 101 mmol/L (ref 98–111)
Creatinine, Ser: 0.68 mg/dL (ref 0.44–1.00)
GFR, Estimated: >60 mL/min (ref >60)
Glucose, Bld: 106 mg/dL — ABNORMAL HIGH (ref 70–99)
Potassium: 3.4 mmol/L — ABNORMAL LOW (ref 3.5–5.1)
Sodium: 134 mmol/L — ABNORMAL LOW (ref 135–145)

## 2020-03-12 LAB — CBC
HCT: 37.3 % (ref 36.0–46.0)
Hemoglobin: 11.8 g/dL — ABNORMAL LOW (ref 12.0–15.0)
MCH: 26.3 pg (ref 26.0–34.0)
MCHC: 31.6 g/dL (ref 30.0–36.0)
MCV: 83.1 fL (ref 80.0–100.0)
Platelets: 255 10*3/uL (ref 150–400)
RBC: 4.49 MIL/uL (ref 3.87–5.11)
RDW: 18.3 % — ABNORMAL HIGH (ref 11.5–15.5)
WBC: 17.6 10*3/uL — ABNORMAL HIGH (ref 4.0–10.5)
nRBC: 0 % (ref 0.0–0.2)

## 2020-03-12 LAB — MAGNESIUM: Magnesium: 1.8 mg/dL (ref 1.7–2.4)

## 2020-03-12 LAB — HEPARIN LEVEL (UNFRACTIONATED)
Heparin Unfractionated: 0.36 IU/mL (ref 0.30–0.70)
Heparin Unfractionated: 0.31 IU/mL (ref 0.30–0.70)

## 2020-03-12 MED ORDER — APIXABAN 5 MG PO TABS
5.0000 mg | ORAL_TABLET | Freq: Two times a day (BID) | ORAL | 2 refills | Status: DC
Start: 2020-03-12 — End: 2020-08-16

## 2020-03-12 MED ORDER — NICOTINE 14 MG/24HR TD PT24: 14.0000 mg | MEDICATED_PATCH | TRANSDERMAL | 0 refills | Status: AC

## 2020-03-12 MED ORDER — APIXABAN 5 MG PO TABS
5.0000 mg | ORAL_TABLET | Freq: Two times a day (BID) | ORAL | Status: DC
  Administered 2020-03-12: 5 mg via ORAL
  Filled 2020-03-12: qty 1

## 2020-03-12 MED ORDER — HYDROCODONE-ACETAMINOPHEN 5-325 MG PO TABS: 1.0000 | ORAL_TABLET | ORAL | 0 refills | Status: AC | PRN

## 2020-03-12 NOTE — Consult Note (Signed)
ANTICOAGULATION CONSULT NOTE  Pharmacy Consult for heparin infusion Indication: DVT  Patient Measurements: HEPARIN DW (KG): 54.7   Labs: Recent Labs    03/09/20 1956 03/09/20 1956 03/09/20 2147 03/10/20 0540 03/10/20 1519 03/11/20 0451 03/11/20 0451 03/11/20 1149 03/11/20 1149 03/11/20 1858 03/12/20 0347 03/12/20 1031  HGB 15.4*   < >  --  12.9  --  12.3   < > 11.8*  --   --  11.8*  --   HCT 46.7*   < >  --  40.1  --  38.4  --  36.9  --   --  37.3  --   PLT 342   < >  --  280  --  234  --  242  --   --  255  --   APTT  --   --  28 157*  --   --   --   --   --   --   --   --   LABPROT  --   --  13.8  --   --   --   --   --   --   --   --   --   INR  --   --  1.1  --   --   --   --   --   --   --   --   --   HEPARINUNFRC  --   --   --  0.74*   < > 0.27*   < > 0.30   < > 0.27* 0.36 0.31  CREATININE 0.88  --   --  0.78  --   --   --   --   --   --  0.68  --    < > = values in this interval not displayed.    Estimated Creatinine Clearance: 77.9 mL/min (by C-G formula based on SCr of 0.68 mg/dL).   Medical History: 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    Assessment: 44 y.o. female who presents to the ED for evaluation of abdominal pain. 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  Medical admission 1 year ago at our facility where she was transitioned to Eliquis from warfarin. Her last dose of apixaban was over 1 month ago. She was admitted in September 2020 and was on a heparin infusion for an aortic clot. Data from that admission was used to guide initial heparin infusion rate.  11/21 1858 HL 0.27, rate increased to 1600 units/hr 11/22 0347 HL 0.36, therapeutic x 1 11/22 1031 HL 0.31, therapeutic x 2  Goal of Therapy:  Heparin level 0.3-0.7 units/ml Monitor platelets by  anticoagulation protocol: Yes   Plan:  11/22 1031 HL 0.31, therapeutic x 2. Will continue with current rate of 1600 units/hr. Next HL with AM labs. Daily CBC while on Heparin drip.  Paulina Fusi, PharmD, BCPS 03/12/2020 10:57 AM

## 2020-03-12 NOTE — Plan of Care (Signed)
DISCHARGE NOTE HOME Suzanne [name] to be discharged home per MD order. Discussed prescriptions and follow up appointments with the patient. Medication list explained in detail. Patient verbalized understanding.  Skin clean, dry and intact without evidence of skin break down, no evidence of skin tears noted. IV catheter discontinued intact. Site without signs and symptoms of complications. Dressing and pressure applied. Pt denies pain at the site currently. No complaints noted.  Patient free of lines, drains, and wounds.   An After Visit Summary (AVS) was printed and given to the patient. Patient wanted to walk out and discharged home via private auto.  Stephan Minister, RN

## 2020-03-12 NOTE — Progress Notes (Signed)
[facility]  Daily Progress Note   Subjective  -   Patient feels well.  Says she is much better.  Wants to go home.  Right arm has soreness at the antecubital fossa which is expected after the incision.  Mild right arm swelling. Abdominal pain almost completely resolved.  Tolerating diet.  Objective Vitals:   03/11/20 1622 03/11/20 2223 03/12/20 0402 03/12/20 0751  BP: 133/78 128/74 (!) 148/80 (!) 151/77  Pulse: 64 66 74 (!) 55  Resp: 18 18 16 15   Temp: 97.9 F (36.6 C) 98 F (36.7 C) 98.9 F (37.2 C) 98.2 F (36.8 C)  TempSrc:  Oral Oral Oral  SpO2: 98% 99% 98% 100%  Weight:   54.4 kg   Height:        Intake/Output Summary (Last 24 hours) at 03/12/2020 1101 Last data filed at 03/12/2020 0041 Gross per 24 hour  Intake 2789.25 ml  Output 0 ml  Net 2789.25 ml    PULM  CTAB CV  RRR VASC  2+ right radial pulse.  Mild right arm swelling.  Laboratory CBC    Component Value Date/Time   WBC 17.6 (H) 03/12/2020 0347   HGB 11.8 (L) 03/12/2020 0347   HGB 17.1 (H) 04/23/2011 1538   HCT 37.3 03/12/2020 0347   HCT 50.5 (H) 04/23/2011 1538   PLT 255 03/12/2020 0347   PLT 419 04/23/2011 1538    BMET    Component Value Date/Time   NA 134 (L) 03/12/2020 0347   NA 140 04/23/2011 1538   K 3.4 (L) 03/12/2020 0347   K 3.9 04/23/2011 1538   CL 101 03/12/2020 0347   CL 105 04/23/2011 1538   CO2 22 03/12/2020 0347   CO2 25 04/23/2011 1538   GLUCOSE 106 (H) 03/12/2020 0347   GLUCOSE 117 (H) 04/23/2011 1538   BUN 10 03/12/2020 0347   BUN 2 (L) 04/23/2011 1538   CREATININE 0.68 03/12/2020 0347   CREATININE 0.63 04/23/2011 1538   CALCIUM 7.5 (L) 03/12/2020 0347   CALCIUM 8.9 04/23/2011 1538   GFRNONAA >60 03/12/2020 0347   GFRNONAA >60 04/23/2011 1538   GFRAA >60 12/22/2018 0839   GFRAA >60 04/23/2011 1538    Assessment/Planning: POD #2 s/p right arm embolectomy   Arm is doing well.  Mild swelling but nothing significant.  Palpable radial  pulse.  Still has a large amount of residual thrombus elsewhere including significant thoracic aortic thrombus, innominate thrombus, and infarcts to the spleen and right kidney.  May also have some small bowel infarcts.  Not having abdominal pain significantly.  White count is trending down.  No role for intervention for any of the sites.  Was discussed if she had worsening abdominal pain, general surgery would need to see the patient for potential gangrenous changes to the bowel but that has not occurred thankfully.  I am okay switching her off of IV heparin and over to oral anticoagulation.  Eliquis would be the easiest.  Once she is stable on oral anticoagulation, she can likely be discharged either later today or tomorrow.  I would like to see her back in the office in about 2 to 3 weeks with a repeat CT scan of the chest, abdomen, and pelvis.    [name]  03/12/2020, 11:01 AM

## 2020-03-12 NOTE — Consult Note (Signed)
ANTICOAGULATION CONSULT NOTE  Pharmacy Consult for heparin infusion Indication: DVT  Patient Measurements: HEPARIN DW (KG): 54.7   Labs: Recent Labs    03/09/20 1956 03/09/20 1956 03/09/20 2147 03/10/20 0540 03/10/20 1519 03/11/20 0451 03/11/20 0451 03/11/20 1149 03/11/20 1858 03/12/20 0347  HGB 15.4*   < >  --  12.9  --  12.3   < > 11.8*  --  11.8*  HCT 46.7*   < >  --  40.1  --  38.4  --  36.9  --  37.3  PLT 342   < >  --  280  --  234  --  242  --  255  APTT  --   --  28 157*  --   --   --   --   --   --   LABPROT  --   --  13.8  --   --   --   --   --   --   --   INR  --   --  1.1  --   --   --   --   --   --   --   HEPARINUNFRC  --   --   --  0.74*   < > 0.27*   < > 0.30 0.27* 0.36  CREATININE 0.88  --   --  0.78  --   --   --   --   --  0.68   < > = values in this interval not displayed.    Estimated Creatinine Clearance: 78.3 mL/min (by C-G formula based on SCr of 0.68 mg/dL).   Medical History: Past Medical History:  Diagnosis Date  . 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 CRPS (complex regional pain syndrome type I) 12/2019  . 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 female who presents to the ED for evaluation of abdominal pain. 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  Medical admission 1 year ago at our facility where she was transitioned to Eliquis from warfarin. Her last dose of apixaban was over 1 month ago. She was admitted in September 2020 and was on a heparin infusion for an aortic clot. Data from that admission was used to guide initial heparin infusion rate.  Goal of Therapy:  Heparin level 0.3-0.7 units/ml Monitor platelets by anticoagulation protocol: Yes   Plan:  --11/21 at 1858 HL = 0.27, slightly subtherapeutic. Will increase heparin drip rate to 1600 units/hr --Re-check HL 6 hours after rate change --Daily CBC per protocol  11/22: HL @ 0347 = 0.36,  therapeutic X 1  Will continue pt on current rate and recheck HL in 6 hrs.   Damali Broadfoot D  03/10/2020   6:10 PM

## 2020-03-12 NOTE — Discharge Summary (Signed)
Physician Discharge Summary  Suzanne [name] DPO:[id number] DOB: 1975/07/05 DOA: 03/09/2020  PCP: System, Provider Not In  Admit date: 03/09/2020 Discharge date: 03/22/2020  Admitted From: home Disposition:  home  Recommendations for Outpatient Follow-up:  1. Follow up with PCP in 1-2 weeks 2. Please obtain BMP/CBC in one week 3. Please follow up with Dr. Lucky Cowboy, vascular surgery, in 2-3 weeks 4. Patient was prescribed nicotine patches for smoking cessation.  Please follow up on this and consider medication such as Wellbutrin or Chantix to help with smoking cessation.  Home Health: No  Equipment/Devices: None   Discharge Condition: Stable  CODE STATUS: Full  Diet recommendation: Heart Healthy    Discharge Diagnoses: Principal Problem:   Arterial thrombosis ([place]) Active Problems:   Splenic infarction   Tobacco dependence    Summary of HPI and Hospital Course:  Suzanne [name] is a 44 y.o. 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  Patient reported having run out of her Eliquis 1 month ago.  Of note, has a history of prior noncompliance with warfarin for anticoagulation and continues to smoke.  Admitted to hospitalist service with vascular surgery consulted  Started on heparin drip.  Taken to OR with vascular surgery for immediate revascularization of the radial and ulnar arterial thromboses to prevent loss of the distal right upper extremity.      Ischemic right upper extremity secondary to arterial thrombosis of the right radial and ulnar arteries -also has right subclavian and innominate arterial thromboses.  Hypercoagulable history as outlined above with ongoing nicotine use and  noncompliance with anticoagulation. Vascular surgery was consulted.   Pt taken to the OR for immediate embolectomy of the radial and ulnar occlusive thrombi to prevent loss of the distal right upper extremity Was continued on heparin and transitioned back to Eliquis prior to discharge.  Multiple arterial thrombi -involving right upper extremity as above, right kidney, IMA, thoracic and abdominal aorta, small acute splenic infarct and multiple chronic splenic infarcts.   --Resumed on Eliquis --Follow up with Vascular surgery in 2-3 weeks for repeat CT chest/abdomen/pelvis --Risk for bowel ischemia. If any abdominal pain, pt counseled to seek emergent medical attention  Abdominal pain - Resolved.  Likely secondary to recurrent or progressive thrombosis involving spleen and IMA.  Vascular surgery feels these are best managed with anticoagulation as above.  Pain control as needed per orders.    Nicotine dependence -patient counseled extensively on smoking cessation and increased risk of blood clotting with ongoing use.  Nicotine patch ordered and prescribed at discharge.  PCP follow up.  Hypertensive urgency -Resolved.  On admission, patient's BP was uncontrolled as high as 191/106.   Suspect due to severe pain, and question noncompliance with BP meds, nicotine use.   Patient is not on antihypertensives as outpatient and BP now controlled.   Leukocytosis - suspect reactive in setting of diffuse thrombi.  No evidence of symptoms of infection.   Monitor CBC and for clinical s/sx's of infection.   Discharge Instructions   Discharge Instructions    Call MD for:   Complete by: As directed    Worsening abdominal pain, cold or painful extremities, shortness of breath or chest pain.   Call MD for:  extreme fatigue   Complete by: As directed    Call MD for:  persistant dizziness or light-headedness   Complete by: As directed    Call MD for:  persistant nausea and vomiting   Complete by: As  directed    Call MD for:  redness, tenderness, or signs of infection (pain, swelling, redness, odor or green/yellow discharge around incision site)   Complete by: As directed    Call MD for:  severe uncontrolled pain   Complete by: As directed    Call MD for:  temperature >100.4   Complete by: As directed    Diet - low sodium heart healthy   Complete by: As directed    Discharge instructions   Complete by: As directed    Make sure that you take Eliquis twice daily, every day.  Follow up with Dr. Lucky Cowboy as planned in a few weeks.  If you start having abdominal pain, please come to the ER for evaluation right away.  Use nicotine patches, and recommend discussing medication such as Wellbutrin or Chantix with your primary care doctor for longer term smoking cessation assistance.   Discharge wound care:   Complete by: As directed    Okay to wash incision site with soap and water, gently pat dry.   Increase activity slowly   Complete by: As directed      Allergies as of 03/12/2020   No Known Allergies     Medication List    TAKE these medications   albuterol 108 (90 Base) MCG/ACT inhaler Commonly known as: VENTOLIN HFA Inhale 2 puffs into the lungs every 6 (six) hours as needed for wheezing or shortness of breath.   apixaban 5 MG Tabs tablet Commonly known as: ELIQUIS Take 1 tablet (5 mg total) by mouth 2 (two) times daily. What changed:   how much to take  additional instructions   nicotine 14 mg/24hr patch Commonly known as: NICODERM CQ - dosed in mg/24 hours Place 1 patch (14 mg total) onto the skin daily.     ASK your doctor about these medications   HYDROcodone-acetaminophen 5-325 MG tablet Commonly known as: NORCO/VICODIN Take 1 tablet by mouth every 4 (four) hours as needed for up to 5 days for moderate pain or severe pain. Ask about: Should I take this medication?            Discharge Care Instructions  (From admission, onward)         Start      Ordered   03/12/20 0000  Discharge wound care:       Comments: Okay to wash incision site with soap and water, gently pat dry.   03/12/20 1411          Follow-up Information    Dew, Erskine Squibb, MD. Schedule an appointment as soon as possible for a visit on 04/03/2020.   Specialties: Vascular Surgery, Radiology, Interventional Cardiology Why: Follow up in 2-3 weeks.... @ 1pm Contact information: [place] [address] [phone]              No Known Allergies  Consultations:  Vascular surgery    Procedures/Studies: [place] UPPER EXTREMITY DUPLEX RIGHT (NON-WBI)  Result Date: 03/09/2020 CLINICAL DATA:  Cold painful right hand and wrist EXAM: RIGHT UPPER EXTREMITY ARTERIAL DUPLEX SCAN TECHNIQUE: Gray-scale sonography as well as color Doppler and duplex ultrasound was performed to evaluate the arteries of the upper extremity. COMPARISON:  None. FINDINGS: Nonocclusive thrombus is seen within the right subclavian  artery. Right axillary artery, brachial artery and proximal radial artery are patent. Occlusive thrombus seen in the distal radial artery and in the mid to distal ulnar artery. IMPRESSION: Occlusive thrombus within the radial artery and ulnar artery at the right wrist. Nonocclusive thrombus within the subclavian artery. This could be a source of emboli. These results were called by telephone at the time of interpretation on 03/09/2020 at 10:34 pm to provider [facility] , who verbally acknowledged these results. Electronically Signed   By: Rolm Baptise M.D.   On: 03/09/2020 22:34   CT Angio Chest/Abd/Pel for Dissection W and/or Wo Contrast  Addendum Date: 03/10/2020   ADDENDUM REPORT: 03/10/2020 05:09 ADDENDUM: These results will be called to the ordering clinician or representative by the Radiology Department at the imaging location. Electronically Signed   By: Margarette Canada M.D.   On: 03/10/2020 05:09   Result Date: 03/10/2020 CLINICAL DATA:  44 year old female  with chest and abdominal pain for 3 days. History of prior aortic thrombus and renal infarcts. EXAM: CT ANGIOGRAPHY CHEST, ABDOMEN AND PELVIS TECHNIQUE: Non-contrast CT of the chest was initially obtained. Multidetector CT imaging through the chest, abdomen and pelvis was performed using the standard protocol during bolus administration of intravenous contrast. Multiplanar reconstructed images and MIPs were obtained and reviewed to evaluate the vascular anatomy. CONTRAST:  100 cc OMNIPAQUE IOHEXOL 350 MG/ML SOLN COMPARISON:  12/21/2018 and prior CTs FINDINGS: CTA CHEST FINDINGS Cardiovascular: Heart size is normal. Increasing thrombus within the anterior aspect of the descending thoracic colon noted since 12/21/2018 with increasing irregular thrombus projecting into the aortic lumen. A moderate to large amount of nonocclusive thrombus within the innominate and RIGHT subclavian arteries is now identified There is no evidence of thoracic aortic aneurysm or large central pulmonary emboli. No pericardial effusion is identified. Mediastinum/Nodes: No enlarged mediastinal, hilar, or axillary lymph nodes. Thyroid gland, trachea, and esophagus demonstrate no significant findings. Lungs/Pleura: There is no evidence of airspace disease, consolidation, mass, nodule, pleural effusion or pneumothorax. Scattered blebs along both lung apices are again noted. Musculoskeletal: No acute or suspicious bony abnormality. Review of the MIP images confirms the above findings. CTA ABDOMEN AND PELVIS FINDINGS VASCULAR Aorta: Increasing moderate thrombus along the anterior/LEFT aspect of the infrarenal abdominal aorta noted without evidence of aneurysm. Celiac: No significant abnormality. SMA: No significant abnormality Renals: The visualized portions of the renal arteries are unremarkable. IMA: Not opacified. Veins: No definite venous abnormality identified. Review of the MIP images confirms the above findings. NON-VASCULAR Hepatobiliary:  No liver or gallbladder abnormalities are identified. Pancreas: A 7 mm cystic lesion within the anterior body is unchanged from 2016. No other significant abnormalities. Spleen: Remote splenic infarcts are again noted. There is an equivocal acute infarct within the posteromedial spleen (series 6: Image [id number]). Adrenals/Urinary Tract: Multiple new wedge-shaped hypodense areas within the mid in UPPER RIGHT kidney are noted compatible with multiple new infarcts. Remote LEFT infarcts are again identified. The adrenal glands and bladder are unremarkable. Stomach/Bowel: There is equivocal mild circumferential wall thickening of several small bowel loops within the central abdomen and pelvis. There is no evidence of pneumatosis intestinalis, bowel dilatation, bowel inflammatory changes or other areas of definite bowel wall thickening. The appendix is normal. Lymphatic: No abnormal appearing lymph nodes identified. Reproductive: Uterus and bilateral adnexa are unremarkable. Other: No ascites, pneumoperitoneum or focal collection. Musculoskeletal: No acute or suspicious bony abnormalities identified. Review of the MIP images confirms the above findings. IMPRESSION: 1. Multiple new infarcts within the  mid and upper RIGHT kidney. 2. New moderate to large amount of nonocclusive thrombus within the innominate and RIGHT subclavian arteries. 3. Increasing thrombus within the thoracic and abdominal aorta. Increasing irregular thrombus within the descending thoracic aortic projecting into the aortic lumen. 4. Nonvisualization/occlusion of the internal mesenteric artery. Equivocal mild circumferential wall thickening of several small bowel loops within the central abdomen and pelvis - question enteritis versus ischemia. No pneumoperitoneum, portal venous gas or pneumatosis intestinalis. 5. Possible small acute splenic infarct. Remote splenic and renal infarcts again noted. 6. Unchanged 7 mm cystic lesion within the anterior body of  the pancreas. Consider 1 year follow-up MRI or CT follow-up. 7. Emphysema (ICD10-J43.9). Electronically Signed: By: Margarette Canada M.D. On: 03/10/2020 04:23     Procedure:  Right upper extremity embolectomy  Subjective: Pt feels well this morning.  No abdominal pain.  Right arm and hand doing well, warm, palpable pulse.  States family in process of moving and needs to get home.  Asked for nicotine patches rx.   Discharge Exam: Vitals:   03/12/20 0751 03/12/20 1246  BP: (!) 151/77 (!) 150/93  Pulse: (!) 55 62  Resp: 15 15  Temp: 98.2 F (36.8 C) 98.1 F (36.7 C)  SpO2: 100% 100%   Vitals:   03/11/20 2223 03/12/20 0402 03/12/20 0751 03/12/20 1246  BP: 128/74 (!) 148/80 (!) 151/77 (!) 150/93  Pulse: 66 74 (!) 55 62  Resp: 18 16 15 15   Temp: 98 F (36.7 C) 98.9 F (37.2 C) 98.2 F (36.8 C) 98.1 F (36.7 C)  TempSrc: Oral Oral Oral Oral  SpO2: 99% 98% 100% 100%  Weight:  54.4 kg    Height:        General: Pt is alert, awake, not in acute distress Cardiovascular: RRR, S1/S2 +, no rubs, no gallops Respiratory: CTA bilaterally, no wheezing, no rhonchi Abdominal: Soft, NT, ND, bowel sounds + Extremities: no edema, no cyanosis, right upper extremity incision site intact without surrounding warmth, erythema, improvement in mild forearm swelling, fingers warm    The results of significant diagnostics from this hospitalization (including imaging, microbiology, ancillary and laboratory) are listed below for reference.     Microbiology: No results found for this or any previous visit (from the past 240 hour(s)).   Labs: BNP (last 3 results) No results for input(s): BNP in the last 8760 hours. Basic Metabolic Panel: No results for input(s): NA, K, CL, CO2, GLUCOSE, BUN, CREATININE, CALCIUM, MG, PHOS in the last 168 hours. Liver Function Tests: No results for input(s): AST, ALT, ALKPHOS, BILITOT, PROT, ALBUMIN in the last 168 hours. No results for input(s): LIPASE, AMYLASE in the  last 168 hours. No results for input(s): AMMONIA in the last 168 hours. CBC: No results for input(s): WBC, NEUTROABS, HGB, HCT, MCV, PLT in the last 168 hours. Cardiac Enzymes: No results for input(s): CKTOTAL, CKMB, CKMBINDEX, TROPONINI in the last 168 hours. BNP: Invalid input(s): POCBNP CBG: No results for input(s): GLUCAP in the last 168 hours. D-Dimer No results for input(s): DDIMER in the last 72 hours. Hgb A1c No results for input(s): HGBA1C in the last 72 hours. Lipid Profile No results for input(s): CHOL, HDL, LDLCALC, TRIG, CHOLHDL, LDLDIRECT in the last 72 hours. Thyroid function studies No results for input(s): TSH, T4TOTAL, T3FREE, THYROIDAB in the last 72 hours.  Invalid input(s): FREET3 Anemia work up No results for input(s): VITAMINB12, FOLATE, FERRITIN, TIBC, IRON, RETICCTPCT in the last 72 hours. Urinalysis    Component Value  Date/Time   COLORURINE YELLOW (A) 03/09/2020 1956   APPEARANCEUR HAZY (A) 03/09/2020 1956   APPEARANCEUR Cloudy 04/23/2011 1538   LABSPEC 1.014 03/09/2020 1956   LABSPEC 1.003 04/23/2011 1538   PHURINE 6.0 03/09/2020 1956   GLUCOSEU NEGATIVE 03/09/2020 1956   GLUCOSEU Negative 04/23/2011 1538   HGBUR SMALL (A) 03/09/2020 1956   BILIRUBINUR NEGATIVE 03/09/2020 1956   BILIRUBINUR Negative 04/23/2011 1538   KETONESUR 5 (A) 03/09/2020 1956   PROTEINUR 100 (A) 03/09/2020 1956   NITRITE NEGATIVE 03/09/2020 1956   LEUKOCYTESUR NEGATIVE 03/09/2020 1956   LEUKOCYTESUR 3+ 04/23/2011 1538   Sepsis Labs Invalid input(s): PROCALCITONIN,  WBC,  LACTICIDVEN Microbiology No results found for this or any previous visit (from the past 240 hour(s)).   Time coordinating discharge: Over 30 minutes  SIGNED:   Ezekiel Slocumb, DO [facility] 03/22/2020, 7:31 AM   If 7PM-7AM, please contact night-coverage [email or web address]

## 2020-03-12 NOTE — TOC Transition Note (Signed)
Transition of Care [facility]) - CM/SW Discharge Note   Patient Details  Name: Suzanne [name] MRN: [id number] Date of Birth: 02-15-1976  Transition of Care [facility]) CM/SW Contact:  Meriel Flavors, LCSW Phone Number: 03/12/2020, 2:19 PM   Clinical Narrative:    CSW received message from attending, patient is discharging today and may need financial assistance with her Eliquis. CSW spoke with patient about any barriers for obtaining her medications. Patient stated she now has medicaid and her copay is $3, she doesn't have any problems getting her medications.         Patient Goals and CMS Choice        Discharge Placement                       Discharge Plan and Services                                     Social Determinants of Health (SDOH) Interventions     Readmission Risk Interventions No flowsheet data found.

## 2020-03-13 LAB — SURGICAL PATHOLOGY

## 2020-04-03 ENCOUNTER — Encounter (INDEPENDENT_AMBULATORY_CARE_PROVIDER_SITE_OTHER): Payer: Self-pay | Admitting: Nurse Practitioner

## 2020-04-03 ENCOUNTER — Other Ambulatory Visit: Payer: Self-pay

## 2020-04-03 ENCOUNTER — Ambulatory Visit (INDEPENDENT_AMBULATORY_CARE_PROVIDER_SITE_OTHER): Payer: Medicaid Other | Admitting: Nurse Practitioner

## 2020-04-03 VITALS — BP 147/87 | HR 70 | Ht 67.0 in | Wt 123.0 lb

## 2020-04-03 DIAGNOSIS — F17200 Nicotine dependence, unspecified, uncomplicated: Secondary | ICD-10-CM

## 2020-04-03 DIAGNOSIS — E785 Hyperlipidemia, unspecified: Secondary | ICD-10-CM

## 2020-04-03 DIAGNOSIS — I749 Embolism and thrombosis of unspecified artery: Secondary | ICD-10-CM | POA: Diagnosis not present

## 2020-04-03 DIAGNOSIS — I7410 Embolism and thrombosis of unspecified parts of aorta: Secondary | ICD-10-CM

## 2020-04-09 ENCOUNTER — Encounter (INDEPENDENT_AMBULATORY_CARE_PROVIDER_SITE_OTHER): Payer: Self-pay | Admitting: Nurse Practitioner

## 2020-04-09 NOTE — Progress Notes (Signed)
Subjective:    Patient ID: Suzanne [name], female    DOB: Aug 24, 1975, 44 y.o.   MRN: [id number] Chief Complaint  Patient presents with  . Follow-up    3 week [facility] post Arterial Thrombosis     Patient presents today for follow-up evaluation after intervention on 03/18/2020.  The patient had an ischemic right upper extremity.  The patient underwent intervention including:  PROCEDURE: 1.   Right brachial, axillary, and subclavian artery embolectomy with 3 Fogarty embolectomy balloon. 2.  Right radial and ulnar embolectomy with 2 and 3 Fogarty embolectomy balloons   Today the patient's wound is essentially healed.  The patient notes that the pain that she was having only in her arm but also her abdomen is essentially doing good.  She also notes that she is continued with her Eliquis as prescribed.  Overall the patient notes that she feels much better.  She denies any fever, chills, nausea, vomiting or diarrhea.  She denies any chest pain or shortness of breath.  She denies any significant lower extremity swelling.   Review of Systems  Hematological: Bruises/bleeds easily.  All other systems reviewed and are negative.      Objective:   Physical Exam Vitals reviewed.  HENT:     Head: Normocephalic.  Cardiovascular:     Rate and Rhythm: Normal rate.     Pulses: Normal pulses.  Pulmonary:     Effort: Pulmonary effort is normal.  Neurological:     Mental Status: She is alert and oriented to person, place, and time.  Psychiatric:        Mood and Affect: Mood normal.        Behavior: Behavior normal.        Thought Content: Thought content normal.        Judgment: Judgment normal.     BP (!) 147/87   Pulse 70   Ht 5\' 7"  (1.702 m)   Wt 123 lb (55.8 kg)   BMI 19.26 kg/m   Past Medical History:  Diagnosis Date  . 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 CRPS (complex regional pain syndrome type I) 12/2019  . 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  Marital status: Divorced    Spouse name: Not on file  . Number of children: Not on file  . Years of education: Not on file  . Highest education level: Not on file  Occupational History  . Not on file  Tobacco Use  . Smoking status: Current Some Day Smoker    Packs/day: 0.25    Years: 20.00    Pack years: 5.00    Types: Cigarettes  . Smokeless tobacco: Never Used  Substance and Sexual Activity  . Alcohol use: Yes    Comment: occ  . Drug use: No  . Sexual activity: Not on file  Other Topics Concern  . Not on file  Social History Narrative  . Not on file   Social Determinants of Health   Financial Resource Strain: Not on file  Food Insecurity: Not on file  Transportation Needs: Not on file  Physical Activity: Not on file  Stress: Not on file  Social Connections: Not on file  Intimate Partner Violence: Not on file    Past Surgical History:  Procedure Laterality Date  . CESAREAN SECTION     times 4  . EMBOLECTOMY Right 03/09/2020   Procedure: Brachial EMBOLECTOMY;  Surgeon: Algernon Huxley, MD;  Location: [facility] ORS;  Service: Vascular;  Laterality: Right;    Family History  Adopted: Yes  Problem Relation Age of Onset  . Hypertension Mother     No Known Allergies  CBC Latest Ref Rng & Units 03/12/2020 03/11/2020 03/11/2020  WBC 4.0 - 10.5 K/uL 17.6(H) 17.8(H) 20.4(H)  Hemoglobin 12.0 - 15.0 g/dL 11.8(L) 11.8(L) 12.3  Hematocrit 36.0 - 46.0 % 37.3 36.9 38.4  Platelets 150 - 400 K/uL 255 242 234      CMP     Component Value Date/Time   NA 134 (L) 03/12/2020 0347   NA 140 04/23/2011 1538   K 3.4 (L) 03/12/2020 0347   K 3.9 04/23/2011 1538   CL 101 03/12/2020 0347   CL 105 04/23/2011 1538   CO2 22 03/12/2020 0347   CO2 25 04/23/2011 1538   GLUCOSE 106 (H) 03/12/2020 0347   GLUCOSE 117 (H) 04/23/2011 1538   BUN 10 03/12/2020 0347   BUN 2 (L) 04/23/2011 1538   CREATININE 0.68 03/12/2020 0347   CREATININE 0.63 04/23/2011 1538   CALCIUM 7.5 (L) 03/12/2020  0347   CALCIUM 8.9 04/23/2011 1538   PROT 7.2 03/09/2020 1956   PROT 7.9 04/23/2011 1538   ALBUMIN 3.3 (L) 03/09/2020 1956   ALBUMIN 3.5 04/23/2011 1538   AST 72 (H) 03/09/2020 1956   AST 21 04/23/2011 1538   ALT 40 03/09/2020 1956   ALT 17 04/23/2011 1538   ALKPHOS 76 03/09/2020 1956   ALKPHOS 73 04/23/2011 1538   BILITOT 0.9 03/09/2020 1956   BILITOT 0.4 04/23/2011 1538   GFRNONAA >60 03/12/2020 0347   GFRNONAA >60 04/23/2011 1538   GFRAA >60 12/22/2018 0839   GFRAA >60 04/23/2011 1538     No results found.     Assessment & Plan:   1. 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  There is also suspected right bowel infarcts.  What is hopeful is that the patient notes that there is a drastic decrease in pain including her arm as well as her abdominal pain.  As previously planned we will have the patient repeat a CT scan to determine progression of the multiple thrombi.  In the interim the patient will continue with Eliquis 5 mg twice a day. - CT Angio Chest/Abd/Pel for Dissection W and/or W/WO; Future  2. Arterial thrombosis ([place]) The patient does note that her upper extremity is feeling much better post intervention.  The patient notes that her arm is still somewhat weak but she has been careful with her activities.  The patient is advised that at this time she will try to go to work on a limited basis with the restriction that she cannot lift heavier than about 15 pounds at this time.  3. Hyperlipidemia, unspecified hyperlipidemia type Continue statin as ordered and reviewed, no changes at this time   4. Tobacco dependence Smoking cessation was discussed, 3-10 minutes spent on this topic specifically    Current Outpatient Medications on File Prior to Visit   Medication Sig Dispense Refill  . apixaban (ELIQUIS) 5 MG TABS tablet Take 1 tablet (5 mg total) by mouth 2 (two) times daily. 60 tablet 2  . nicotine (NICODERM CQ - DOSED IN MG/24 HOURS) 14 mg/24hr patch Place 1 patch (14 mg total) onto the skin daily. 30 patch 0  .  albuterol (PROVENTIL HFA;VENTOLIN HFA) 108 (90 Base) MCG/ACT inhaler Inhale 2 puffs into the lungs every 6 (six) hours as needed for wheezing or shortness of breath. (Patient not taking: Reported on 12/21/2018) 1 Inhaler 2   No current facility-administered medications on file prior to visit.    There are no Patient Instructions on file for this visit. No follow-ups on file.   Kris Hartmann, NP

## 2020-05-02 ENCOUNTER — Telehealth (INDEPENDENT_AMBULATORY_CARE_PROVIDER_SITE_OTHER): Payer: Self-pay

## 2020-05-02 NOTE — Telephone Encounter (Signed)
She can come in with a left upper extremity arterial duplex, however, she should still have her CT scan to follow up on the multiple blood clots that were found during her hospital stay.  However that is ultimately her choice.  I also hope that she has been compliant with her eliquis.

## 2020-05-02 NOTE — Telephone Encounter (Signed)
Pt called and left a Vm on the nurses line saying her Rt arm is cold and numb like previously before her procedure the pt has a hx per her chart of Clotting in this arm an still has blood clots in this arm she was recommenced for a CT of which now she says she does not want she was last seen here on 04/03/20. Please advise.

## 2020-05-02 NOTE — Telephone Encounter (Signed)
Please call the pt and schedule per Np.

## 2020-05-03 NOTE — Telephone Encounter (Signed)
Called 2x and lvm for pt to call back to get scheduled

## 2020-05-21 ENCOUNTER — Telehealth (INDEPENDENT_AMBULATORY_CARE_PROVIDER_SITE_OTHER): Payer: Self-pay | Admitting: Vascular Surgery

## 2020-05-21 ENCOUNTER — Other Ambulatory Visit (INDEPENDENT_AMBULATORY_CARE_PROVIDER_SITE_OTHER): Payer: Self-pay | Admitting: Nurse Practitioner

## 2020-05-21 DIAGNOSIS — R200 Anesthesia of skin: Secondary | ICD-10-CM

## 2020-05-21 DIAGNOSIS — M79603 Pain in arm, unspecified: Secondary | ICD-10-CM

## 2020-05-21 DIAGNOSIS — R209 Unspecified disturbances of skin sensation: Secondary | ICD-10-CM

## 2020-05-22 ENCOUNTER — Ambulatory Visit (INDEPENDENT_AMBULATORY_CARE_PROVIDER_SITE_OTHER): Payer: Medicaid Other

## 2020-05-22 ENCOUNTER — Encounter (INDEPENDENT_AMBULATORY_CARE_PROVIDER_SITE_OTHER): Payer: Self-pay | Admitting: Vascular Surgery

## 2020-05-22 ENCOUNTER — Other Ambulatory Visit: Payer: Self-pay

## 2020-05-22 ENCOUNTER — Ambulatory Visit (INDEPENDENT_AMBULATORY_CARE_PROVIDER_SITE_OTHER): Payer: Medicaid Other | Admitting: Nurse Practitioner

## 2020-05-22 ENCOUNTER — Telehealth (INDEPENDENT_AMBULATORY_CARE_PROVIDER_SITE_OTHER): Payer: Self-pay

## 2020-05-22 ENCOUNTER — Encounter (INDEPENDENT_AMBULATORY_CARE_PROVIDER_SITE_OTHER): Payer: Self-pay | Admitting: Nurse Practitioner

## 2020-05-22 VITALS — BP 194/106 | HR 67 | Resp 16 | Wt 123.6 lb

## 2020-05-22 DIAGNOSIS — M79603 Pain in arm, unspecified: Secondary | ICD-10-CM

## 2020-05-22 DIAGNOSIS — F17200 Nicotine dependence, unspecified, uncomplicated: Secondary | ICD-10-CM

## 2020-05-22 DIAGNOSIS — E785 Hyperlipidemia, unspecified: Secondary | ICD-10-CM

## 2020-05-22 DIAGNOSIS — I749 Embolism and thrombosis of unspecified artery: Secondary | ICD-10-CM | POA: Diagnosis not present

## 2020-05-22 DIAGNOSIS — R200 Anesthesia of skin: Secondary | ICD-10-CM

## 2020-05-22 DIAGNOSIS — R209 Unspecified disturbances of skin sensation: Secondary | ICD-10-CM | POA: Diagnosis not present

## 2020-05-22 NOTE — Telephone Encounter (Signed)
Spoke with the patient and she is scheduled with Dr. Lucky Cowboy for a right upper angio on 05/28/20 with a 11:45 am arrival time to the [facility].  Covid testing on 05/24/20 between 8-1 pm at the [facility]. Pre-procedure instructions were discussed and will be mailed.

## 2020-05-22 NOTE — Progress Notes (Signed)
 Subjective:    Patient ID: Suzanne [name], female    DOB: 07/31/1975, 45 y.o.   MRN: [id number] Chief Complaint  Patient presents with  . Follow-up    Add on ue arterial    The patient is seen for evaluation of painful upper extremity.  Patient notes that the pain began about 2 weeks ago.  Previously the patient had intervention on her right upper extremity including:  PROCEDURE: 1.   Right brachial, axillary, and subclavian artery embolectomy with 3 Fogarty embolectomy balloon. 2.  Right radial and ulnar embolectomy with 2 and 3 Fogarty embolectomy balloons   Initially it didn't seem to be very painful but over the ensuing several days the arm has become increasingly painful.  The patient notes the pain is always associated with activity and is very consistent day to day. The pain seems to wax and wane and is variable with time.  Patient also has discoloration of her upper extremities as well with color changes ranging from pale to cyanotic.  The patient states arm pain is having a significant negative impact on quality of life and daily activities.  The patient reports being adherent with her anticoagulation.    The patient denies rest pain. No significant weakness or atrophy is reported. There are no open wounds or sores at this time.   The patient denies amaurosis fugax or recent TIA symptoms. There are no recent neurological changes noted. The patient denies history of DVT, PE or superficial thrombophlebitis. The patient denies recent episodes of angina or shortness of breath.  Today noninvasive study shows obstruction noted in the brachial artery, radial artery and palmar arch.   Review of Systems  Musculoskeletal: Positive for arthralgias.  Skin: Positive for color change and pallor.  All other systems reviewed and are negative.      Objective:   Physical Exam Vitals reviewed.  HENT:     Head: Normocephalic.  Cardiovascular:     Rate and Rhythm: Normal rate.      Pulses:          Radial pulses are 0 on the right side.  Pulmonary:     Effort: Pulmonary effort is normal.  Skin:    General: Skin is dry.     Coloration: Skin is pale.  Neurological:     Mental Status: She is alert and oriented to person, place, and time.  Psychiatric:        Mood and Affect: Mood normal.        Behavior: Behavior normal.        Thought Content: Thought content normal.        Judgment: Judgment normal.     BP (!) 194/106 (BP Location: Left Arm)   Pulse 67   Resp 16   Wt 123 lb 9.6 oz (56.1 kg)   BMI 19.36 kg/m   Past Medical History:  Diagnosis Date  . Aortic thrombus ([facility])   . CRPS (complex regional pain syndrome type I) 12/2019  . 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 Marital status: Divorced    Spouse name: Not on file  . Number of children: Not on file  . Years of education: Not on file  . Highest education level: Not on file  Occupational History  . Not on file  Tobacco Use  . Smoking status: Current Some Day Smoker    Packs/day: 0.25    Years: 20.00    Pack years: 5.00      Types: Cigarettes  . Smokeless tobacco: Never Used  Substance and Sexual Activity  . Alcohol use: Yes    Comment: occ  . Drug use: No  . Sexual activity: Not on file  Other Topics Concern  . Not on file  Social History Narrative  . Not on file   Social Determinants of Health   Financial Resource Strain: Not on file  Food Insecurity: Not on file  Transportation Needs: Not on file  Physical Activity: Not on file  Stress: Not on file  Social Connections: Not on file  Intimate Partner Violence: Not on file    Past Surgical History:  Procedure Laterality Date  . CESAREAN SECTION     times 4  . EMBOLECTOMY Right 03/09/2020   Procedure: Brachial EMBOLECTOMY;  Surgeon: Algernon Huxley, MD;  Location: [facility] ORS;  Service: Vascular;  Laterality: Right;    Family History  Adopted: Yes  Problem Relation Age of Onset  . Hypertension  Mother     No Known Allergies  CBC Latest Ref Rng & Units 03/12/2020 03/11/2020 03/11/2020  WBC 4.0 - 10.5 K/uL 17.6(H) 17.8(H) 20.4(H)  Hemoglobin 12.0 - 15.0 g/dL 11.8(L) 11.8(L) 12.3  Hematocrit 36.0 - 46.0 % 37.3 36.9 38.4  Platelets 150 - 400 K/uL 255 242 234      CMP     Component Value Date/Time   NA 134 (L) 03/12/2020 0347   NA 140 04/23/2011 1538   K 3.4 (L) 03/12/2020 0347   K 3.9 04/23/2011 1538   CL 101 03/12/2020 0347   CL 105 04/23/2011 1538   CO2 22 03/12/2020 0347   CO2 25 04/23/2011 1538   GLUCOSE 106 (H) 03/12/2020 0347   GLUCOSE 117 (H) 04/23/2011 1538   BUN 10 03/12/2020 0347   BUN 2 (L) 04/23/2011 1538   CREATININE 0.68 03/12/2020 0347   CREATININE 0.63 04/23/2011 1538   CALCIUM 7.5 (L) 03/12/2020 0347   CALCIUM 8.9 04/23/2011 1538   PROT 7.2 03/09/2020 1956   PROT 7.9 04/23/2011 1538   ALBUMIN 3.3 (L) 03/09/2020 1956   ALBUMIN 3.5 04/23/2011 1538   AST 72 (H) 03/09/2020 1956   AST 21 04/23/2011 1538   ALT 40 03/09/2020 1956   ALT 17 04/23/2011 1538   ALKPHOS 76 03/09/2020 1956   ALKPHOS 73 04/23/2011 1538   BILITOT 0.9 03/09/2020 1956   BILITOT 0.4 04/23/2011 1538   GFRNONAA >60 03/12/2020 0347   GFRNONAA >60 04/23/2011 1538   GFRAA >60 12/22/2018 0839   GFRAA >60 04/23/2011 1538     No results found.     Assessment & Plan:   1. Arterial thrombosis ([facility]) Recommend:  The patient has evidence of severe atherosclerotic changes of her right upper extremities with rest pain that is associated with preulcerative changes and impending tissue loss of the hand.  This represents a limb threatening ischemia and places the patient at the risk for limb loss.  Patient should undergo angiography of the lower extremities with the hope for intervention for limb salvage.  The risks and benefits as well as the alternative therapies was discussed in detail with the patient.  All questions were answered.  Patient agrees to proceed with  angiography.  The patient will follow up with me in the office after the procedure.       2. Tobacco dependence Smoking cessation was discussed, 3-10 minutes spent on this topic specifically   3. Hyperlipidemia, unspecified hyperlipidemia type Continue statin as ordered and reviewed, no changes at  this time   Current Outpatient Medications on File Prior to Visit  Medication Sig Dispense Refill  . apixaban (ELIQUIS) 5 MG TABS tablet Take 1 tablet (5 mg total) by mouth 2 (two) times daily. 60 tablet 2  . albuterol (PROVENTIL HFA;VENTOLIN HFA) 108 (90 Base) MCG/ACT inhaler Inhale 2 puffs into the lungs every 6 (six) hours as needed for wheezing or shortness of breath. (Patient not taking: No sig reported) 1 Inhaler 2   No current facility-administered medications on file prior to visit.    There are no Patient Instructions on file for this visit. No follow-ups on file.   Kris Hartmann, NP

## 2020-05-22 NOTE — H&P (View-Only) (Signed)
Subjective:    Patient ID: Suzanne [name], female    DOB: 12/04/1975, 45 y.o.   MRN: [id number] Chief Complaint  Patient presents with  . Follow-up    Add on ue arterial    The patient is seen for evaluation of painful upper extremity.  Patient notes that the pain began about 2 weeks ago.  Previously the patient had intervention on her right upper extremity including:  PROCEDURE: 1.   Right brachial, axillary, and subclavian artery embolectomy with 3 Fogarty embolectomy balloon. 2.  Right radial and ulnar embolectomy with 2 and 3 Fogarty embolectomy balloons   Initially it didn't seem to be very painful but over the ensuing several days the arm has become increasingly painful.  The patient notes the pain is always associated with activity and is very consistent day to day. The pain seems to wax and wane and is variable with time.  Patient also has discoloration of her upper extremities as well with color changes ranging from pale to cyanotic.  The patient states arm pain is having a significant negative impact on quality of life and daily activities.  The patient reports being adherent with her anticoagulation.    The patient denies rest pain. No significant weakness or atrophy is reported. There are no open wounds or sores at this time.   The patient denies amaurosis fugax or recent TIA symptoms. There are no recent neurological changes noted. The patient denies history of DVT, PE or superficial thrombophlebitis. The patient denies recent episodes of angina or shortness of breath.  Today noninvasive study shows obstruction noted in the brachial artery, radial artery and palmar arch.   Review of Systems  Musculoskeletal: Positive for arthralgias.  Skin: Positive for color change and pallor.  All other systems reviewed and are negative.      Objective:   Physical Exam Vitals reviewed.  HENT:     Head: Normocephalic.  Cardiovascular:     Rate and Rhythm: Normal rate.      Pulses:          Radial pulses are 0 on the right side.  Pulmonary:     Effort: Pulmonary effort is normal.  Skin:    General: Skin is dry.     Coloration: Skin is pale.  Neurological:     Mental Status: She is alert and oriented to person, place, and time.  Psychiatric:        Mood and Affect: Mood normal.        Behavior: Behavior normal.        Thought Content: Thought content normal.        Judgment: Judgment normal.     BP (!) 194/106 (BP Location: Left Arm)   Pulse 67   Resp 16   Wt 123 lb 9.6 oz (56.1 kg)   BMI 19.36 kg/m   Past Medical History:  Diagnosis Date  . Aortic thrombus ([facility])   . CRPS (complex regional pain syndrome type I) 12/2019  . 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 Marital status: Divorced    Spouse name: Not on file  . Number of children: Not on file  . Years of education: Not on file  . Highest education level: Not on file  Occupational History  . Not on file  Tobacco Use  . Smoking status: Current Some Day Smoker    Packs/day: 0.25    Years: 20.00    Pack years: 5.00  Types: Cigarettes  . Smokeless tobacco: Never Used  Substance and Sexual Activity  . Alcohol use: Yes    Comment: occ  . Drug use: No  . Sexual activity: Not on file  Other Topics Concern  . Not on file  Social History Narrative  . Not on file   Social Determinants of Health   Financial Resource Strain: Not on file  Food Insecurity: Not on file  Transportation Needs: Not on file  Physical Activity: Not on file  Stress: Not on file  Social Connections: Not on file  Intimate Partner Violence: Not on file    Past Surgical History:  Procedure Laterality Date  . CESAREAN SECTION     times 4  . EMBOLECTOMY Right 03/09/2020   Procedure: Brachial EMBOLECTOMY;  Surgeon: Algernon Huxley, MD;  Location: [facility] ORS;  Service: Vascular;  Laterality: Right;    Family History  Adopted: Yes  Problem Relation Age of Onset  . Hypertension  Mother     No Known Allergies  CBC Latest Ref Rng & Units 03/12/2020 03/11/2020 03/11/2020  WBC 4.0 - 10.5 K/uL 17.6(H) 17.8(H) 20.4(H)  Hemoglobin 12.0 - 15.0 g/dL 11.8(L) 11.8(L) 12.3  Hematocrit 36.0 - 46.0 % 37.3 36.9 38.4  Platelets 150 - 400 K/uL 255 242 234      CMP     Component Value Date/Time   NA 134 (L) 03/12/2020 0347   NA 140 04/23/2011 1538   K 3.4 (L) 03/12/2020 0347   K 3.9 04/23/2011 1538   CL 101 03/12/2020 0347   CL 105 04/23/2011 1538   CO2 22 03/12/2020 0347   CO2 25 04/23/2011 1538   GLUCOSE 106 (H) 03/12/2020 0347   GLUCOSE 117 (H) 04/23/2011 1538   BUN 10 03/12/2020 0347   BUN 2 (L) 04/23/2011 1538   CREATININE 0.68 03/12/2020 0347   CREATININE 0.63 04/23/2011 1538   CALCIUM 7.5 (L) 03/12/2020 0347   CALCIUM 8.9 04/23/2011 1538   PROT 7.2 03/09/2020 1956   PROT 7.9 04/23/2011 1538   ALBUMIN 3.3 (L) 03/09/2020 1956   ALBUMIN 3.5 04/23/2011 1538   AST 72 (H) 03/09/2020 1956   AST 21 04/23/2011 1538   ALT 40 03/09/2020 1956   ALT 17 04/23/2011 1538   ALKPHOS 76 03/09/2020 1956   ALKPHOS 73 04/23/2011 1538   BILITOT 0.9 03/09/2020 1956   BILITOT 0.4 04/23/2011 1538   GFRNONAA >60 03/12/2020 0347   GFRNONAA >60 04/23/2011 1538   GFRAA >60 12/22/2018 0839   GFRAA >60 04/23/2011 1538     No results found.     Assessment & Plan:   1. Arterial thrombosis ([facility]) Recommend:  The patient has evidence of severe atherosclerotic changes of her right upper extremities with rest pain that is associated with preulcerative changes and impending tissue loss of the hand.  This represents a limb threatening ischemia and places the patient at the risk for limb loss.  Patient should undergo angiography of the lower extremities with the hope for intervention for limb salvage.  The risks and benefits as well as the alternative therapies was discussed in detail with the patient.  All questions were answered.  Patient agrees to proceed with  angiography.  The patient will follow up with me in the office after the procedure.       2. Tobacco dependence Smoking cessation was discussed, 3-10 minutes spent on this topic specifically   3. Hyperlipidemia, unspecified hyperlipidemia type Continue statin as ordered and reviewed, no changes at  this time   Current Outpatient Medications on File Prior to Visit  Medication Sig Dispense Refill  . apixaban (ELIQUIS) 5 MG TABS tablet Take 1 tablet (5 mg total) by mouth 2 (two) times daily. 60 tablet 2  . albuterol (PROVENTIL HFA;VENTOLIN HFA) 108 (90 Base) MCG/ACT inhaler Inhale 2 puffs into the lungs every 6 (six) hours as needed for wheezing or shortness of breath. (Patient not taking: No sig reported) 1 Inhaler 2   No current facility-administered medications on file prior to visit.    There are no Patient Instructions on file for this visit. No follow-ups on file.   Kris Hartmann, NP

## 2020-05-24 ENCOUNTER — Other Ambulatory Visit: Payer: Self-pay

## 2020-05-24 ENCOUNTER — Other Ambulatory Visit
Admission: RE | Admit: 2020-05-24 | Discharge: 2020-05-24 | Disposition: A | Discharge status: Home / Self Care | Payer: Medicaid Other | Source: Ambulatory Visit | Attending: Vascular Surgery | Admitting: Vascular Surgery

## 2020-05-24 DIAGNOSIS — Z01812 Encounter for preprocedural laboratory examination: Secondary | ICD-10-CM | POA: Diagnosis not present

## 2020-05-24 DIAGNOSIS — Z20822 Contact with and (suspected) exposure to covid-19: Secondary | ICD-10-CM | POA: Insufficient documentation

## 2020-05-25 LAB — SARS CORONAVIRUS 2 (TAT 6-24 HRS): SARS Coronavirus 2: NEGATIVE

## 2020-05-28 ENCOUNTER — Encounter
Admission: RE | Disposition: A | Discharge status: Home / Self Care | Payer: Self-pay | Source: Home / Self Care | Attending: Vascular Surgery

## 2020-05-28 ENCOUNTER — Other Ambulatory Visit (INDEPENDENT_AMBULATORY_CARE_PROVIDER_SITE_OTHER): Payer: Self-pay | Admitting: Nurse Practitioner

## 2020-05-28 ENCOUNTER — Ambulatory Visit
Admission: RE | Admit: 2020-05-28 | Discharge: 2020-05-28 | Disposition: A | Discharge status: Home / Self Care | Payer: Medicaid Other | Attending: Vascular Surgery | Admitting: Vascular Surgery

## 2020-05-28 ENCOUNTER — Encounter: Payer: Self-pay | Admitting: Vascular Surgery

## 2020-05-28 DIAGNOSIS — F17210 Nicotine dependence, cigarettes, uncomplicated: Secondary | ICD-10-CM | POA: Insufficient documentation

## 2020-05-28 DIAGNOSIS — Z79899 Other long term (current) drug therapy: Secondary | ICD-10-CM | POA: Insufficient documentation

## 2020-05-28 DIAGNOSIS — I70228 Atherosclerosis of native arteries of extremities with rest pain, other extremity: Secondary | ICD-10-CM | POA: Insufficient documentation

## 2020-05-28 DIAGNOSIS — I742 Embolism and thrombosis of arteries of the upper extremities: Secondary | ICD-10-CM

## 2020-05-28 DIAGNOSIS — Z8249 Family history of ischemic heart disease and other diseases of the circulatory system: Secondary | ICD-10-CM | POA: Diagnosis not present

## 2020-05-28 DIAGNOSIS — I749 Embolism and thrombosis of unspecified artery: Secondary | ICD-10-CM

## 2020-05-28 DIAGNOSIS — Z7901 Long term (current) use of anticoagulants: Secondary | ICD-10-CM | POA: Insufficient documentation

## 2020-05-28 DIAGNOSIS — E785 Hyperlipidemia, unspecified: Secondary | ICD-10-CM | POA: Insufficient documentation

## 2020-05-28 HISTORY — PX: UPPER EXTREMITY ANGIOGRAPHY: CATH118270

## 2020-05-28 LAB — CREATININE, SERUM
Creatinine, Ser: 0.71 mg/dL (ref 0.44–1.00)
GFR, Estimated: >60 mL/min (ref >60)

## 2020-05-28 LAB — BUN: BUN: 8 mg/dL (ref 6–20)

## 2020-05-28 SURGERY — UPPER EXTREMITY ANGIOGRAPHY
Anesthesia: Moderate Sedation | Site: Arm Upper | Laterality: Right

## 2020-05-28 MED ORDER — HEPARIN SODIUM (PORCINE) 1000 UNIT/ML IJ SOLN
INTRAMUSCULAR | Status: AC
  Filled 2020-05-28: qty 1

## 2020-05-28 MED ORDER — FAMOTIDINE 20 MG PO TABS: 40.0000 mg | ORAL_TABLET | Freq: Once | ORAL | Status: DC | PRN

## 2020-05-28 MED ORDER — CEFAZOLIN SODIUM-DEXTROSE 2-4 GM/100ML-% IV SOLN
2.0000 g | Freq: Once | INTRAVENOUS | Status: AC
  Administered 2020-05-28: 2 g via INTRAVENOUS

## 2020-05-28 MED ORDER — HYDROMORPHONE HCL 1 MG/ML IJ SOLN: 1.0000 mg | Freq: Once | INTRAMUSCULAR | Status: DC | PRN

## 2020-05-28 MED ORDER — FENTANYL CITRATE (PF) 100 MCG/2ML IJ SOLN
INTRAMUSCULAR | Status: AC
  Filled 2020-05-28: qty 2

## 2020-05-28 MED ORDER — ONDANSETRON HCL 4 MG/2ML IJ SOLN: 4.0000 mg | Freq: Four times a day (QID) | INTRAMUSCULAR | Status: DC | PRN

## 2020-05-28 MED ORDER — IODIXANOL 320 MG/ML IV SOLN
INTRAVENOUS | Status: DC | PRN
  Administered 2020-05-28: 115 mL

## 2020-05-28 MED ORDER — HEPARIN SODIUM (PORCINE) 1000 UNIT/ML IJ SOLN
INTRAMUSCULAR | Status: DC | PRN
  Administered 2020-05-28: 4000 [IU] via INTRAVENOUS

## 2020-05-28 MED ORDER — ASPIRIN EC 81 MG PO TBEC: 81.0000 mg | DELAYED_RELEASE_TABLET | Freq: Every day | ORAL | 2 refills | Status: DC

## 2020-05-28 MED ORDER — MIDAZOLAM HCL 2 MG/ML PO SYRP: 8.0000 mg | ORAL_SOLUTION | Freq: Once | ORAL | Status: AC | PRN

## 2020-05-28 MED ORDER — FENTANYL CITRATE (PF) 100 MCG/2ML IJ SOLN
INTRAMUSCULAR | Status: DC | PRN
  Administered 2020-05-28 (×4): 50 ug via INTRAVENOUS

## 2020-05-28 MED ORDER — HYDRALAZINE HCL 20 MG/ML IJ SOLN
INTRAMUSCULAR | Status: AC
  Filled 2020-05-28: qty 1

## 2020-05-28 MED ORDER — MIDAZOLAM HCL 2 MG/2ML IJ SOLN
INTRAMUSCULAR | Status: AC
  Filled 2020-05-28: qty 4

## 2020-05-28 MED ORDER — NITROGLYCERIN 1 MG/10 ML FOR IR/CATH LAB
INTRA_ARTERIAL | Status: AC
  Filled 2020-05-28: qty 10

## 2020-05-28 MED ORDER — METHYLPREDNISOLONE SODIUM SUCC 125 MG IJ SOLR: 125.0000 mg | Freq: Once | INTRAMUSCULAR | Status: DC | PRN

## 2020-05-28 MED ORDER — HYDROMORPHONE HCL 1 MG/ML IJ SOLN
INTRAMUSCULAR | Status: AC
  Filled 2020-05-28: qty 1

## 2020-05-28 MED ORDER — LABETALOL HCL 5 MG/ML IV SOLN
INTRAVENOUS | Status: AC
  Filled 2020-05-28: qty 4

## 2020-05-28 MED ORDER — HYDRALAZINE HCL 20 MG/ML IJ SOLN
INTRAMUSCULAR | Status: DC | PRN
  Administered 2020-05-28 (×2): 10 mg via INTRAVENOUS

## 2020-05-28 MED ORDER — MIDAZOLAM HCL 2 MG/2ML IJ SOLN
INTRAMUSCULAR | Status: DC | PRN
  Administered 2020-05-28 (×2): 2 mg via INTRAVENOUS

## 2020-05-28 MED ORDER — NITROGLYCERIN 1 MG/10 ML FOR IR/CATH LAB
INTRA_ARTERIAL | Status: DC | PRN
  Administered 2020-05-28: 250 ug via INTRA_ARTERIAL
  Administered 2020-05-28: 300 ug via INTRA_ARTERIAL

## 2020-05-28 MED ORDER — SODIUM CHLORIDE 0.9 % IV SOLN: INTRAVENOUS | Status: DC

## 2020-05-28 MED ORDER — DIPHENHYDRAMINE HCL 50 MG/ML IJ SOLN
INTRAMUSCULAR | Status: DC | PRN
  Administered 2020-05-28: 50 mg via INTRAVENOUS

## 2020-05-28 MED ORDER — DIPHENHYDRAMINE HCL 50 MG/ML IJ SOLN: 50.0000 mg | Freq: Once | INTRAMUSCULAR | Status: DC | PRN

## 2020-05-28 MED ORDER — MIDAZOLAM HCL 2 MG/ML PO SYRP
ORAL_SOLUTION | ORAL | Status: AC
  Administered 2020-05-28: 8 mg via ORAL
  Filled 2020-05-28: qty 4

## 2020-05-28 MED ORDER — LABETALOL HCL 5 MG/ML IV SOLN
INTRAVENOUS | Status: DC | PRN
  Administered 2020-05-28: 20 mg via INTRAVENOUS

## 2020-05-28 MED ORDER — DIPHENHYDRAMINE HCL 50 MG/ML IJ SOLN
INTRAMUSCULAR | Status: AC
  Filled 2020-05-28: qty 1

## 2020-05-28 SURGICAL SUPPLY — 28 items
BALLN LUTONIX 018 4X300X130 (BALLOONS) ×2
BALLN LUTONIX 018 5X220X130 (BALLOONS) ×2
BALLN ULTRVRSE 018 2.5X150X150 (BALLOONS) ×2
BALLOON LUTONIX 018 4X300X130 (BALLOONS) ×1 IMPLANT
BALLOON LUTONIX 018 5X220X130 (BALLOONS) ×1 IMPLANT
BALLOON ULTRVS 018 2.5X150X150 (BALLOONS) ×1 IMPLANT
CANISTER PENUMBRA ENGINE (MISCELLANEOUS) ×2 IMPLANT
CATH ANGIO 5F PIGTAIL 100CM (CATHETERS) ×2 IMPLANT
CATH BEACON 5 .035 100 JB2 TIP (CATHETERS) ×2 IMPLANT
CATH CXI SUPP 2.6F 150 ANG (CATHETERS) ×2 IMPLANT
CATH INDIGO CAT RX KIT (CATHETERS) ×2 IMPLANT
CATH INDIGO CAT6 KIT (CATHETERS) ×2 IMPLANT
CATH VERT 5FR 125CM (CATHETERS) ×2 IMPLANT
DEVICE SAFEGUARD 24CM (GAUZE/BANDAGES/DRESSINGS) ×2 IMPLANT
DEVICE STARCLOSE SE CLOSURE (Vascular Products) ×2 IMPLANT
GLIDEWIRE ADV .035X260CM (WIRE) ×2 IMPLANT
GLIDEWIRE ANGLED SS 035X260CM (WIRE) ×2 IMPLANT
GUIDEWIRE PFTE-COATED .018X300 (WIRE) ×2 IMPLANT
KIT ENCORE 26 ADVANTAGE (KITS) ×2 IMPLANT
PACK ANGIOGRAPHY (CUSTOM PROCEDURE TRAY) ×2 IMPLANT
SHEATH BRITE TIP 5FRX11 (SHEATH) ×2 IMPLANT
SHEATH PINNACLE ST 6F 65CM (SHEATH) ×2 IMPLANT
STENT VIABAHN 6X250X120 (Permanent Stent) ×2 IMPLANT
SYR MEDRAD MARK 7 150ML (SYRINGE) ×2 IMPLANT
TUBING CONTRAST HIGH PRESS 20 (MISCELLANEOUS) ×8 IMPLANT
WIRE G V18X300CM (WIRE) ×2 IMPLANT
WIRE GUIDERIGHT .035X150 (WIRE) ×2 IMPLANT
WIRE RUNTHROUGH .014X300CM (WIRE) ×2 IMPLANT

## 2020-05-28 NOTE — Interval H&P Note (Signed)
History and Physical Interval Note:  05/28/2020 11:52 AM  Suzanne [name]  has presented today for surgery, with the diagnosis of Right upper extremity angio    Arterial thrombosis Covid Feb 3.  The various methods of treatment have been discussed with the patient and family. After consideration of risks, benefits and other options for treatment, the patient has consented to  Procedure(s): UPPER EXTREMITY ANGIOGRAPHY (Right) as a surgical intervention.  The patient's history has been reviewed, patient examined, no change in status, stable for surgery.  I have reviewed the patient's chart and labs.  Questions were answered to the patient's satisfaction.     [name]

## 2020-05-28 NOTE — Op Note (Signed)
OPERATIVE REPORT   PREOPERATIVE DIAGNOSIS: 1. Ischemic right arm with pain  POSTOPERATIVE DIAGNOSIS: Same as above  PROCEDURE PERFORMED: 1. Ultrasound guidance vascular access to right femoral artery. 2. Catheter placement to right interosseus artery and right ulnar arteries  from right femoral approach. 3. Thoracic aortogram and selective right upper extremity angiogram  including selective images of the interosseus and ulnar arteries. 4.  Mechanical thrombectomy to the right brachial artery with the penumbra cat 6 device into the right ulnar artery with the CAT Rx device 5.  Percutaneous transluminal angioplasty of right ulnar artery with 2.5 mm diameter angioplasty balloon 6.  Percutaneous transluminal angioplasty of right brachial artery with 4 mm diameter Lutonix drug-coated angioplasty balloon 7.  Viabahn stent placement for residual thrombus and stenosis after above procedures with 6 mm diameter by 25 cm length Viabahn stent to the right brachial artery 8. StarClose closure device right femoral artery.  SURGEON: Algernon Huxley, MD  ANESTHESIA: Local with moderate conscious sedation for 87 minutes using 4 mg of Versed and 200 mcg of Fentanyl  BLOOD LOSS: Minimal.  FLUOROSCOPY TIME: 14  INDICATION FOR PROCEDURE: This is a 45 y.o.female who presented to our office with rest pain and ischemia of the right hand. To further evaluate this to determine what options would be possible to treat the ischemia, angiogram of the right upper extremity is indicated. Risks and benefits are discussed. Informed consent was obtained.  DESCRIPTION OF PROCEDURE: The patient was brought to the vascular suite. Moderate conscious sedation was administered during a face to face encounter with the patient throughout the procedure with my supervision of the RN administering medicines and monitoring the patient's vital signs, pulse oximetry, telemetry and mental status throughout  from the start of the procedure until the patient was taken to the recovery room.  Groins were shaved and prepped and sterile surgical field was created. The right femoral head was localized with fluoroscopy and the right femoral artery was then visualized with ultrasound and found to be widely patent. It was then accessed under direct ultrasound guidance without difficulty with a Seldinger needle and a permanent image was recorded. A J-wire and 5-French sheath were then placed. Pigtail catheter was placed into the ascending aorta and a thoracic aortogram was then performed in the LAO projection. This demonstrated normal origins to the great vessels without significant proximal stenoses and a normal configuration of the great vessels. The patient was given 4000 units of intravenous heparin and a JB2 catheter was used to selectively cannulate the the innominate artery and advanced into the proximal right subclavian artery without difficulty. This was then sequentially advanced to the brachial artery and imaging was performed.  There was occlusion of the mid brachial artery with several collaterals feeding downstream.  On initial image, there was very faint distal reconstitution of the distal ulnar artery in the interosseous artery.  We then proceeded with attempt at treatment.  Using an advantage wire, I was able to get into the occlusion and then when exchanged for a long sheath that was put up into the right axillary artery.  Tediously, I was able to cross the occlusion with the advantage wire and a long Kumpe and get it initially to the interosseous artery.  I placed a V 18 wire for mechanical thrombectomy of the right brachial artery down to the interosseous artery with the penumbra cat 6 device.  This did restore a channel of blood although it remained highly narrowed with a lot of residual  thrombus in the right brachial artery.  Using a long CXI catheter I was able to then get into the ulnar  artery and cross the occlusion confirming intraluminal flow in the wrist.  We then exchanged for a 0.014 wire and perform mechanical thrombectomy with the CAT Rx catheter in the right ulnar artery.  Once again, there remained a lot of residual thrombus and greater than 80% narrowing in multiple spots in the ulnar artery as well.  Proceeded with angioplasty.  The ulnar artery was treated with a 2.5 mm diameter angioplasty balloon inflated to 6 atm distally and then a second flush more proximally was to 10 atm.  The brachial artery was then treated with a 4 mm diameter Lutonix drug-coated angioplasty balloon inflated to 8 atm for 1 minute.  The ulnar artery was now in line and appeared to have less than 30% residual stenosis.  The brachial artery still had multiple areas of greater than 50% residual stenosis and thrombus and elected to cover this area with a covered stent.  A 6 mm diameter by 25 cm length Viabahn stent was deployed from the brachial artery just distal to the elbow but proximal to the bifurcation back to the proximal to mid brachial artery.  This is postdilated with a 5 mm balloon with excellent angiographic completion result and less than 10% residual stenosis. The diagnostic catheter was removed. Oblique arteriogram was performed of the right femoral artery and StarClose closure device deployed in the usual fashion with excellent hemostatic result. The patient tolerated the procedure well and was taken to the recovery room in stable condition.   [name] 05/28/2020 3:46 PM

## 2020-05-28 NOTE — Discharge Instructions (Signed)
Femoral Site Care  This sheet gives you information about how to care for yourself after your procedure. Your health care provider may also give you more specific instructions. If you have problems or questions, contact your health care provider. What can I expect after the procedure? After the procedure, it is common to have:  Bruising that usually fades within 1-2 weeks.  Tenderness at the site. Follow these instructions at home: Wound care  Follow instructions from your health care provider about how to take care of your insertion site. Make sure you: ? Wash your hands with soap and water before you change your bandage (dressing). If soap and water are not available, use hand sanitizer. ? Change your dressing as told by your health care provider. ? Leave stitches (sutures), skin glue, or adhesive strips in place. These skin closures may need to stay in place for 2 weeks or longer. If adhesive strip edges start to loosen and curl up, you may trim the loose edges. Do not remove adhesive strips completely unless your health care provider tells you to do that.  Do not take baths, swim, or use a hot tub until your health care provider approves.  You may shower 24-48 hours after the procedure or as told by your health care provider. ? Gently wash the site with plain soap and water. ? Pat the area dry with a clean towel. ? Do not rub the site. This may cause bleeding.  Do not apply powder or lotion to the site. Keep the site clean and dry.  Check your femoral site every day for signs of infection. Check for: ? Redness, swelling, or pain. ? Fluid or blood. ? Warmth. ? Pus or a bad smell. Activity  For the first 2-3 days after your procedure, or as long as directed: ? Avoid climbing stairs as much as possible. ? Do not squat.  Do not lift anything that is heavier than 10 lb (4.5 kg), or the limit that you are told, until your health care provider says that it is safe.  Rest as  directed. ? Avoid sitting for a long time without moving. Get up to take short walks every 1-2 hours.  Do not drive for 24 hours if you were given a medicine to help you relax (sedative). General instructions  Take over-the-counter and prescription medicines only as told by your health care provider.  Keep all follow-up visits as told by your health care provider. This is important. Contact a health care provider if you have:  A fever or chills.  You have redness, swelling, or pain around your insertion site. Get help right away if:  The catheter insertion area swells very fast.  You pass out.  You suddenly start to sweat or your skin gets clammy.  The catheter insertion area is bleeding, and the bleeding does not stop when you hold steady pressure on the area.  The area near or just beyond the catheter insertion site becomes pale, cool, tingly, or numb. These symptoms may represent a serious problem that is an emergency. Do not wait to see if the symptoms will go away. Get medical help right away. Call your local emergency services (911 in the [place] Do not drive yourself to the hospital. Summary  After the procedure, it is common to have bruising that usually fades within 1-2 weeks.  Check your femoral site every day for signs of infection.  Do not lift anything that is heavier than 10 lb (4.5 kg), or   the limit that you are told, until your health care provider says that it is safe. This information is not intended to replace advice given to you by your health care provider. Make sure you discuss any questions you have with your health care provider. Document Revised: 12/09/2019 Document Reviewed: 12/09/2019 Elsevier Patient Education  2021 Else[employer] Inc. Moderate Conscious Sedation, Adult, Care After This sheet gives you information about how to care for yourself after your procedure. Your health care provider may also give you more specific instructions. If you have problems  or questions, contact your health care provider. What can I expect after the procedure? After the procedure, it is common to have:  Sleepiness for several hours.  Impaired judgment for several hours.  Difficulty with balance.  Vomiting if you eat too soon. Follow these instructions at home: For the time period you were told by your health care provider:  Rest.  Do not participate in activities where you could fall or become injured.  Do not drive or use machinery.  Do not drink alcohol.  Do not take sleeping pills or medicines that cause drowsiness.  Do not make important decisions or sign legal documents.  Do not take care of children on your own.      Eating and drinking  Follow the diet recommended by your health care provider.  Drink enough fluid to keep your urine pale yellow.  If you vomit: ? Drink water, juice, or soup when you can drink without vomiting. ? Make sure you have little or no nausea before eating solid foods.   General instructions  Take over-the-counter and prescription medicines only as told by your health care provider.  Have a responsible adult stay with you for the time you are told. It is important to have someone help care for you until you are awake and alert.  Do not smoke.  Keep all follow-up visits as told by your health care provider. This is important. Contact a health care provider if:  You are still sleepy or having trouble with balance after 24 hours.  You feel light-headed.  You keep feeling nauseous or you keep vomiting.  You develop a rash.  You have a fever.  You have redness or swelling around the IV site. Get help right away if:  You have trouble breathing.  You have new-onset confusion at home. Summary  After the procedure, it is common to feel sleepy, have impaired judgment, or feel nauseous if you eat too soon.  Rest after you get home. Know the things you should not do after the procedure.  Follow the  diet recommended by your health care provider and drink enough fluid to keep your urine pale yellow.  Get help right away if you have trouble breathing or new-onset confusion at home. This information is not intended to replace advice given to you by your health care provider. Make sure you discuss any questions you have with your health care provider. Document Revised: 08/05/2019 Document Reviewed: 03/03/2019 Elsevier Patient Education  2021 Elsevier Inc.  

## 2020-05-29 ENCOUNTER — Telehealth (INDEPENDENT_AMBULATORY_CARE_PROVIDER_SITE_OTHER): Payer: Self-pay

## 2020-05-29 ENCOUNTER — Other Ambulatory Visit (INDEPENDENT_AMBULATORY_CARE_PROVIDER_SITE_OTHER): Payer: Self-pay | Admitting: Nurse Practitioner

## 2020-05-29 ENCOUNTER — Encounter: Payer: Self-pay | Admitting: Vascular Surgery

## 2020-05-29 NOTE — Telephone Encounter (Signed)
Not unusual to have pain and discomfort given the level of intervention.  Nausea likely from the dye, she should drink lots of water/fluids to try and flush from body.  Confirm her pharmacy and we will send in pain and nausea medication.

## 2020-05-29 NOTE — Telephone Encounter (Signed)
I spoke to the  pt and made her aware of the NP instructions I also spoke to Dr. Lucky Cowboy and he made me aware that the pt can take Ib profen and I and I  made the pt aware . The pt  was very upset but verbalized she understood .

## 2020-05-29 NOTE — Telephone Encounter (Signed)
Pt called and left a VM on the nurses line saying she is in a lot of pain and nausea after her procedure yesterday with Dr. Lucky Cowboy . The pt had a upper extremity angio performed . Please advise.

## 2020-05-30 ENCOUNTER — Encounter: Payer: Self-pay | Admitting: Vascular Surgery

## 2020-05-30 NOTE — Telephone Encounter (Signed)
Please see the message above also,the pt confirmed  she still uses the [facility] on [address] in [place].

## 2020-06-08 ENCOUNTER — Ambulatory Visit (INDEPENDENT_AMBULATORY_CARE_PROVIDER_SITE_OTHER): Payer: Medicaid Other | Admitting: Vascular Surgery

## 2020-07-10 ENCOUNTER — Telehealth (INDEPENDENT_AMBULATORY_CARE_PROVIDER_SITE_OTHER): Payer: Self-pay

## 2020-07-10 NOTE — Telephone Encounter (Signed)
Pt called andleft a VM on the nurses line she had an upper extremity angio on 2/8 an needs to schedule a follow. She would also like to be seen for her hand she had an accident where she slammed it in a car door now the end of one of her fingers has a blood clot and her hand is cold and purple and its affecting her work. Please advise.

## 2020-07-10 NOTE — Telephone Encounter (Signed)
I agree that she does need a follow up scheduled (with a right upper extremity art duplex).  However in the case of the hand that she slammed in the car door.  She needs to go to urgent care.  It is likely that she broke a bone (and it needs to be evaluated with an x ray and orthopedic doctor (which we dont do).  Now, if she has issues with blood flow in her arm, we will address that but we wouldn't be able to fix her hand because it is likely out of out scope (especially if it is broken)

## 2020-07-11 NOTE — Telephone Encounter (Signed)
I called the pt and left a Vm on her phone with the instructions from the NP. I also made her aware that someone will reach out to her in order to schedule her for her F/U and U/S.

## 2020-07-11 NOTE — Telephone Encounter (Signed)
Please see my last notes  for this pt and schedule.

## 2020-07-11 NOTE — Telephone Encounter (Signed)
Please see the message below.

## 2020-07-13 NOTE — Telephone Encounter (Signed)
She can take melatonin.  For any other sleeping pills she would need to see her PCP

## 2020-07-13 NOTE — Telephone Encounter (Signed)
Patient has been scheduled on April 1 at 2:30pm, then see [facility].  Patient also wants to know if it's okay to take melatonin along with her blood thinners.  She needs something to help her sleep.

## 2020-07-13 NOTE — Telephone Encounter (Signed)
Patient was made aware with medical advice and verbalized understanding 

## 2020-07-18 ENCOUNTER — Ambulatory Visit (INDEPENDENT_AMBULATORY_CARE_PROVIDER_SITE_OTHER): Payer: Medicaid Other | Admitting: Nurse Practitioner

## 2020-07-18 ENCOUNTER — Other Ambulatory Visit (INDEPENDENT_AMBULATORY_CARE_PROVIDER_SITE_OTHER): Payer: Medicaid Other

## 2020-07-19 ENCOUNTER — Other Ambulatory Visit (INDEPENDENT_AMBULATORY_CARE_PROVIDER_SITE_OTHER): Payer: Self-pay | Admitting: Vascular Surgery

## 2020-07-19 DIAGNOSIS — I998 Other disorder of circulatory system: Secondary | ICD-10-CM

## 2020-07-19 DIAGNOSIS — Z95820 Peripheral vascular angioplasty status with implants and grafts: Secondary | ICD-10-CM

## 2020-07-20 ENCOUNTER — Ambulatory Visit (INDEPENDENT_AMBULATORY_CARE_PROVIDER_SITE_OTHER): Payer: Medicaid Other

## 2020-07-20 ENCOUNTER — Other Ambulatory Visit: Payer: Self-pay

## 2020-07-20 ENCOUNTER — Encounter (INDEPENDENT_AMBULATORY_CARE_PROVIDER_SITE_OTHER): Payer: Self-pay | Admitting: Nurse Practitioner

## 2020-07-20 ENCOUNTER — Ambulatory Visit (INDEPENDENT_AMBULATORY_CARE_PROVIDER_SITE_OTHER): Payer: Medicaid Other | Admitting: Nurse Practitioner

## 2020-07-20 VITALS — BP 205/115 | HR 71 | Resp 16 | Ht 67.0 in | Wt 127.0 lb

## 2020-07-20 DIAGNOSIS — S60429A Blister (nonthermal) of unspecified finger, initial encounter: Secondary | ICD-10-CM

## 2020-07-20 DIAGNOSIS — F17200 Nicotine dependence, unspecified, uncomplicated: Secondary | ICD-10-CM | POA: Diagnosis not present

## 2020-07-20 DIAGNOSIS — Z95820 Peripheral vascular angioplasty status with implants and grafts: Secondary | ICD-10-CM | POA: Diagnosis not present

## 2020-07-20 DIAGNOSIS — E785 Hyperlipidemia, unspecified: Secondary | ICD-10-CM | POA: Diagnosis not present

## 2020-07-20 DIAGNOSIS — I998 Other disorder of circulatory system: Secondary | ICD-10-CM | POA: Diagnosis not present

## 2020-07-20 DIAGNOSIS — I749 Embolism and thrombosis of unspecified artery: Secondary | ICD-10-CM

## 2020-07-20 MED ORDER — HYDROCODONE-ACETAMINOPHEN 5-325 MG PO TABS: 1.0000 | ORAL_TABLET | Freq: Four times a day (QID) | ORAL | 0 refills | Status: DC | PRN

## 2020-07-20 NOTE — H&P (View-Only) (Signed)
Subjective:    Patient ID: Suzanne [name], female    DOB: Oct 15, 1975, 45 y.o.   MRN: [id number] Chief Complaint  Patient presents with  . Follow-up    Suzanne [name] is a 45 year old female that presents today for evaluation of her right upper extremity.  The patient underwent intervention on 05/28/2020 with a right thrombectomy and stent placed to her brachial artery.  The patient notes that today her hand feels better than it did previously however there is still some numbness present.  The patient also notes that she slammed her hand in a car door several days after the procedure and she had severe pain in her hand especially her right fifth finger.  His finger is blister, swollen and tender to the touch.  She also has some difficulty with movement in her third finger at the joint area.  The patient notes that her hand feels cold at times.  Fortunately the patient does note that she has been adherent to taking her anticoagulation as prescribed and she is also recently stopped smoking.  She denies any fever or chills.  Today noninvasive studies show an occluded new proximal brachial artery to radial artery stent status post embolectomy and stent placement.  There is minimal monophasic flow in the distal radial artery fed by collateral and native ulnar arteries.   Review of Systems  Musculoskeletal: Positive for myalgias.  Hematological: Bruises/bleeds easily.  All other systems reviewed and are negative.      Objective:   Physical Exam Vitals reviewed.  HENT:     Head: Normocephalic.  Cardiovascular:     Rate and Rhythm: Normal rate.     Pulses:          Radial pulses are 1+ on the right side.  Pulmonary:     Effort: Pulmonary effort is normal.  Musculoskeletal:     Right hand: Swelling present. Decreased range of motion. Abnormal pulse.     Comments: Blister on right pinky with decreased range of motion on third finger  Neurological:     Mental Status: She is alert and  oriented to person, place, and time.  Psychiatric:        Mood and Affect: Mood normal.        Behavior: Behavior normal.        Thought Content: Thought content normal.        Judgment: Judgment normal.     BP (!) 205/115 (BP Location: Right Arm)   Pulse 71   Resp 16   Ht 5\' 7"  (1.702 m)   Wt 127 lb (57.6 kg)   BMI 19.89 kg/m   Past Medical History:  Diagnosis Date  . 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 CRPS (complex regional pain syndrome type I) 12/2019  . 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 Marital status: Divorced    Spouse name: Not on file  . Number of children: Not on file  . Years of education: Not on file  . Highest education level: Not on file  Occupational History  . Not on file  Tobacco Use  . Smoking status: Current Some Day Smoker    Packs/day: 0.25    Years: 20.00    Pack years: 5.00    Types: Cigarettes  . Smokeless tobacco: Never Used  Substance and Sexual Activity  . Alcohol use: Yes    Comment: occ  . Drug use: No  . Sexual activity: Not on file  Other Topics Concern  . Not on file  Social History Narrative  . Not on file   Social Determinants of Health   Financial Resource Strain: Not on file  Food Insecurity: Not on file  Transportation Needs: Not on file  Physical Activity: Not on file  Stress: Not on file  Social Connections: Not on file  Intimate Partner Violence: Not on file    Past Surgical History:  Procedure Laterality Date  . CESAREAN SECTION     times 4  . EMBOLECTOMY Right 03/09/2020   Procedure: Brachial EMBOLECTOMY;  Surgeon: Algernon Huxley, MD;  Location: [facility];  Service: Vascular;  Laterality: Right;  . UPPER EXTREMITY ANGIOGRAPHY Right 05/28/2020   Procedure: UPPER EXTREMITY ANGIOGRAPHY;  Surgeon: Algernon Huxley, MD;  Location: [facility];  Service: Cardiovascular;  Laterality: Right;    Family History  Adopted: Yes  Problem Relation Age of Onset  . Hypertension Mother      No Known Allergies  CBC Latest Ref Rng & Units 03/12/2020 03/11/2020 03/11/2020  WBC 4.0 - 10.5 K/uL 17.6(H) 17.8(H) 20.4(H)  Hemoglobin 12.0 - 15.0 g/dL 11.8(L) 11.8(L) 12.3  Hematocrit 36.0 - 46.0 % 37.3 36.9 38.4  Platelets 150 - 400 K/uL 255 242 234      CMP     Component Value Date/Time   NA 134 (L) 03/12/2020 0347   NA 140 04/23/2011 1538   K 3.4 (L) 03/12/2020 0347   K 3.9 04/23/2011 1538   CL 101 03/12/2020 0347   CL 105 04/23/2011 1538   CO2 22 03/12/2020 0347   CO2 25 04/23/2011 1538   GLUCOSE 106 (H) 03/12/2020 0347   GLUCOSE 117 (H) 04/23/2011 1538   BUN 8 05/28/2020 1220   BUN 2 (L) 04/23/2011 1538   CREATININE 0.71 05/28/2020 1220   CREATININE 0.63 04/23/2011 1538   CALCIUM 7.5 (L) 03/12/2020 0347   CALCIUM 8.9 04/23/2011 1538   PROT 7.2 03/09/2020 1956   PROT 7.9 04/23/2011 1538   ALBUMIN 3.3 (L) 03/09/2020 1956   ALBUMIN 3.5 04/23/2011 1538   AST 72 (H) 03/09/2020 1956   AST 21 04/23/2011 1538   ALT 40 03/09/2020 1956   ALT 17 04/23/2011 1538   ALKPHOS 76 03/09/2020 1956   ALKPHOS 73 04/23/2011 1538   BILITOT 0.9 03/09/2020 1956   BILITOT 0.4 04/23/2011 1538   GFRNONAA >60 05/28/2020 1220   GFRNONAA >60 04/23/2011 1538   GFRAA >60 12/22/2018 0839   GFRAA >60 04/23/2011 1538     No results found.     Assessment & Plan:   1. Arterial thrombosis (HCC)  Recommend:  The patient has evidence of severe atherosclerotic changes of her right upper extremity associated with ulceration and tissue loss of the hand.  This represents a limb threatening ischemia and places the patient at the risk for limb loss.  Patient should undergo angiography of the right upper extremity.  The risks and benefits as well as the alternative therapies was discussed in detail with the patient.  All questions were answered.  Patient agrees to proceed with angiography.  The patient will follow up with me in the office after the procedure.    2. Tobacco  dependence The patient notes that she has stopped smoking for approximately 3 weeks.  Patient is congratulated and continued smoking cessation is encouraged.  3. Hyperlipidemia, unspecified hyperlipidemia type Continue statin as ordered and reviewed, no changes at this time   4. Traumatic blister of finger, initial encounter  The patient slammed her hand in a car door which is likely the initial cause of the blister however this was several weeks ago.  There is concerned that due to the patient's reocclusion of her stent in her right upper extremity the patient may not have adequate blood flow for wound healing.  Due to the extremely painful nature of the blister we will send the patient to a hand specialist for evaluation and possible treatment of area. - AMB referral to orthopedics  Current Outpatient Medications on File Prior to Visit  Medication Sig Dispense Refill  . apixaban (ELIQUIS) 5 MG TABS tablet Take 1 tablet (5 mg total) by mouth 2 (two) times daily. 60 tablet 2  . aspirin EC 81 MG tablet Take 1 tablet (81 mg total) by mouth daily. 150 tablet 2  . albuterol (PROVENTIL HFA;VENTOLIN HFA) 108 (90 Base) MCG/ACT inhaler Inhale 2 puffs into the lungs every 6 (six) hours as needed for wheezing or shortness of breath. (Patient not taking: Reported on 07/20/2020) 1 Inhaler 2   No current facility-administered medications on file prior to visit.    There are no Patient Instructions on file for this visit. No follow-ups on file.   Kris Hartmann, NP

## 2020-07-20 NOTE — Progress Notes (Signed)
Subjective:    Patient ID: Suzanne [name], female    DOB: [birth year], 45 y.o.   MRN: [id number] Chief Complaint  Patient presents with  . Follow-up    Suzanne [name] is a 45 year old female that presents today for evaluation of her right upper extremity.  The patient underwent intervention on 05/28/2020 with a right thrombectomy and stent placed to her brachial artery.  The patient notes that today her hand feels better than it did previously however there is still some numbness present.  The patient also notes that she slammed her hand in a car door several days after the procedure and she had severe pain in her hand especially her right fifth finger.  His finger is blister, swollen and tender to the touch.  She also has some difficulty with movement in her third finger at the joint area.  The patient notes that her hand feels cold at times.  Fortunately the patient does note that she has been adherent to taking her anticoagulation as prescribed and she is also recently stopped smoking.  She denies any fever or chills.  Today noninvasive studies show an occluded new proximal brachial artery to radial artery stent status post embolectomy and stent placement.  There is minimal monophasic flow in the distal radial artery fed by collateral and native ulnar arteries.   Review of Systems  Musculoskeletal: Positive for myalgias.  Hematological: Bruises/bleeds easily.  All other systems reviewed and are negative.      Objective:   Physical Exam Vitals reviewed.  HENT:     Head: Normocephalic.  Cardiovascular:     Rate and Rhythm: Normal rate.     Pulses:          Radial pulses are 1+ on the right side.  Pulmonary:     Effort: Pulmonary effort is normal.  Musculoskeletal:     Right hand: Swelling present. Decreased range of motion. Abnormal pulse.     Comments: Blister on right pinky with decreased range of motion on third finger  Neurological:     Mental Status: She is alert and  oriented to person, place, and time.  Psychiatric:        Mood and Affect: Mood normal.        Behavior: Behavior normal.        Thought Content: Thought content normal.        Judgment: Judgment normal.     BP (!) 205/115 (BP Location: Right Arm)   Pulse 71   Resp 16   Ht 5\' 7"  (1.702 m)   Wt 127 lb (57.6 kg)   BMI 19.89 kg/m   Past Medical History:  Diagnosis Date  . 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 CRPS (complex regional pain syndrome type I) 12/2019  . 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 Marital status: Divorced    Spouse name: Not on file  . Number of children: Not on file  . Years of education: Not on file  . Highest education level: Not on file  Occupational History  . Not on file  Tobacco Use  . Smoking status: Current Some Day Smoker    Packs/day: 0.25    Years: 20.00    Pack years: 5.00    Types: Cigarettes  . Smokeless tobacco: Never Used  Substance and Sexual Activity  . Alcohol use: Yes    Comment: occ  . Drug use: No  . Sexual activity: Not on file  Other Topics Concern  . Not on file  Social History Narrative  . Not on file   Social Determinants of Health   Financial Resource Strain: Not on file  Food Insecurity: Not on file  Transportation Needs: Not on file  Physical Activity: Not on file  Stress: Not on file  Social Connections: Not on file  Intimate Partner Violence: Not on file    Past Surgical History:  Procedure Laterality Date  . CESAREAN SECTION     times 4  . EMBOLECTOMY Right 03/09/2020   Procedure: Brachial EMBOLECTOMY;  Surgeon: Algernon Huxley, MD;  Location: [facility];  Service: Vascular;  Laterality: Right;  . UPPER EXTREMITY ANGIOGRAPHY Right 05/28/2020   Procedure: UPPER EXTREMITY ANGIOGRAPHY;  Surgeon: Algernon Huxley, MD;  Location: [facility];  Service: Cardiovascular;  Laterality: Right;    Family History  Adopted: Yes  Problem Relation Age of Onset  . Hypertension Mother      No Known Allergies  CBC Latest Ref Rng & Units 03/12/2020 03/11/2020 03/11/2020  WBC 4.0 - 10.5 K/uL 17.6(H) 17.8(H) 20.4(H)  Hemoglobin 12.0 - 15.0 g/dL 11.8(L) 11.8(L) 12.3  Hematocrit 36.0 - 46.0 % 37.3 36.9 38.4  Platelets 150 - 400 K/uL 255 242 234      CMP     Component Value Date/Time   NA 134 (L) 03/12/2020 0347   NA 140 04/23/2011 1538   K 3.4 (L) 03/12/2020 0347   K 3.9 04/23/2011 1538   CL 101 03/12/2020 0347   CL 105 04/23/2011 1538   CO2 22 03/12/2020 0347   CO2 25 04/23/2011 1538   GLUCOSE 106 (H) 03/12/2020 0347   GLUCOSE 117 (H) 04/23/2011 1538   BUN 8 05/28/2020 1220   BUN 2 (L) 04/23/2011 1538   CREATININE 0.71 05/28/2020 1220   CREATININE 0.63 04/23/2011 1538   CALCIUM 7.5 (L) 03/12/2020 0347   CALCIUM 8.9 04/23/2011 1538   PROT 7.2 03/09/2020 1956   PROT 7.9 04/23/2011 1538   ALBUMIN 3.3 (L) 03/09/2020 1956   ALBUMIN 3.5 04/23/2011 1538   AST 72 (H) 03/09/2020 1956   AST 21 04/23/2011 1538   ALT 40 03/09/2020 1956   ALT 17 04/23/2011 1538   ALKPHOS 76 03/09/2020 1956   ALKPHOS 73 04/23/2011 1538   BILITOT 0.9 03/09/2020 1956   BILITOT 0.4 04/23/2011 1538   GFRNONAA >60 05/28/2020 1220   GFRNONAA >60 04/23/2011 1538   GFRAA >60 12/22/2018 0839   GFRAA >60 04/23/2011 1538     No results found.     Assessment & Plan:   1. Arterial thrombosis (HCC)  Recommend:  The patient has evidence of severe atherosclerotic changes of her right upper extremity associated with ulceration and tissue loss of the hand.  This represents a limb threatening ischemia and places the patient at the risk for limb loss.  Patient should undergo angiography of the right upper extremity.  The risks and benefits as well as the alternative therapies was discussed in detail with the patient.  All questions were answered.  Patient agrees to proceed with angiography.  The patient will follow up with me in the office after the procedure.    2. Tobacco  dependence The patient notes that she has stopped smoking for approximately 3 weeks.  Patient is congratulated and continued smoking cessation is encouraged.  3. Hyperlipidemia, unspecified hyperlipidemia type Continue statin as ordered and reviewed, no changes at this time   4. Traumatic blister of finger, initial encounter  The patient slammed her hand in a car door which is likely the initial cause of the blister however this was several weeks ago.  There is concerned that due to the patient's reocclusion of her stent in her right upper extremity the patient may not have adequate blood flow for wound healing.  Due to the extremely painful nature of the blister we will send the patient to a hand specialist for evaluation and possible treatment of area. - AMB referral to orthopedics  Current Outpatient Medications on File Prior to Visit  Medication Sig Dispense Refill  . apixaban (ELIQUIS) 5 MG TABS tablet Take 1 tablet (5 mg total) by mouth 2 (two) times daily. 60 tablet 2  . aspirin EC 81 MG tablet Take 1 tablet (81 mg total) by mouth daily. 150 tablet 2  . albuterol (PROVENTIL HFA;VENTOLIN HFA) 108 (90 Base) MCG/ACT inhaler Inhale 2 puffs into the lungs every 6 (six) hours as needed for wheezing or shortness of breath. (Patient not taking: Reported on 07/20/2020) 1 Inhaler 2   No current facility-administered medications on file prior to visit.    There are no Patient Instructions on file for this visit. No follow-ups on file.   Kris Hartmann, NP

## 2020-07-24 ENCOUNTER — Telehealth (INDEPENDENT_AMBULATORY_CARE_PROVIDER_SITE_OTHER): Payer: Self-pay

## 2020-07-24 NOTE — Telephone Encounter (Signed)
I attempted to contact the patient to schedule her for a RUE angio with Dr. Lucky Cowboy. A message was left for a return call.

## 2020-07-24 NOTE — Telephone Encounter (Signed)
Patient called back and is now scheduled for a RUE angio with Dr. Lucky Cowboy on 07/30/20 with a 9:45 am arrival time to the [facility]. Covid testing on 07/26/20 between 8-2 pm at the [facility]. Pre-procedure instructions were discussed and will be mailed.

## 2020-07-26 ENCOUNTER — Other Ambulatory Visit: Payer: Self-pay

## 2020-07-26 ENCOUNTER — Other Ambulatory Visit
Admission: RE | Admit: 2020-07-26 | Discharge: 2020-07-26 | Disposition: A | Discharge status: Home / Self Care | Payer: Medicaid Other | Source: Ambulatory Visit | Attending: Vascular Surgery | Admitting: Vascular Surgery

## 2020-07-26 DIAGNOSIS — Z20822 Contact with and (suspected) exposure to covid-19: Secondary | ICD-10-CM | POA: Insufficient documentation

## 2020-07-26 DIAGNOSIS — Z01812 Encounter for preprocedural laboratory examination: Secondary | ICD-10-CM | POA: Diagnosis present

## 2020-07-27 LAB — SARS CORONAVIRUS 2 (TAT 6-24 HRS): SARS Coronavirus 2: NEGATIVE

## 2020-07-30 ENCOUNTER — Other Ambulatory Visit (INDEPENDENT_AMBULATORY_CARE_PROVIDER_SITE_OTHER): Payer: Self-pay | Admitting: Nurse Practitioner

## 2020-07-30 ENCOUNTER — Telehealth (INDEPENDENT_AMBULATORY_CARE_PROVIDER_SITE_OTHER): Payer: Self-pay

## 2020-07-30 DIAGNOSIS — L97909 Non-pressure chronic ulcer of unspecified part of unspecified lower leg with unspecified severity: Secondary | ICD-10-CM

## 2020-07-30 MED ORDER — METHYLPREDNISOLONE SODIUM SUCC 125 MG IJ SOLR: 125.0000 mg | Freq: Once | INTRAMUSCULAR | Status: DC | PRN

## 2020-07-30 MED ORDER — MIDAZOLAM HCL 2 MG/ML PO SYRP: 8.0000 mg | ORAL_SOLUTION | Freq: Once | ORAL | Status: DC | PRN

## 2020-07-30 MED ORDER — FAMOTIDINE 20 MG PO TABS: 40.0000 mg | ORAL_TABLET | Freq: Once | ORAL | Status: DC | PRN

## 2020-07-30 MED ORDER — CEFAZOLIN SODIUM-DEXTROSE 2-4 GM/100ML-% IV SOLN: 2.0000 g | Freq: Once | INTRAVENOUS | Status: DC

## 2020-07-30 MED ORDER — DIPHENHYDRAMINE HCL 50 MG/ML IJ SOLN: 50.0000 mg | Freq: Once | INTRAMUSCULAR | Status: DC | PRN

## 2020-07-30 MED ORDER — SODIUM CHLORIDE 0.9 % IV SOLN: INTRAVENOUS | Status: DC

## 2020-07-30 NOTE — Telephone Encounter (Signed)
Patient was on the scheduled with Dr. Lucky Cowboy for 07/30/20 with a 10:00 am arrival time for a LUE angio. Patient has been rescheduled for 08/06/20 with a 9:45 am arrival time to the [facility]. Covid testing on 08/02/20 between 8-2 pm at the [facility]. Pre-procedure instructions will be mailed.

## 2020-07-31 ENCOUNTER — Other Ambulatory Visit (INDEPENDENT_AMBULATORY_CARE_PROVIDER_SITE_OTHER): Payer: Self-pay | Admitting: Nurse Practitioner

## 2020-08-02 ENCOUNTER — Other Ambulatory Visit
Admission: RE | Admit: 2020-08-02 | Discharge: 2020-08-02 | Disposition: A | Discharge status: Home / Self Care | Payer: Medicaid Other | Source: Ambulatory Visit | Attending: Vascular Surgery | Admitting: Vascular Surgery

## 2020-08-02 ENCOUNTER — Other Ambulatory Visit: Payer: Self-pay

## 2020-08-02 DIAGNOSIS — Z20822 Contact with and (suspected) exposure to covid-19: Secondary | ICD-10-CM | POA: Insufficient documentation

## 2020-08-02 DIAGNOSIS — Z01812 Encounter for preprocedural laboratory examination: Secondary | ICD-10-CM | POA: Insufficient documentation

## 2020-08-02 LAB — SARS CORONAVIRUS 2 (TAT 6-24 HRS): SARS Coronavirus 2: NEGATIVE

## 2020-08-06 DIAGNOSIS — L97909 Non-pressure chronic ulcer of unspecified part of unspecified lower leg with unspecified severity: Secondary | ICD-10-CM

## 2020-08-07 ENCOUNTER — Telehealth (INDEPENDENT_AMBULATORY_CARE_PROVIDER_SITE_OTHER): Payer: Self-pay

## 2020-08-07 NOTE — Telephone Encounter (Signed)
Patient left a voicemail stating that her finger tip is black,swollen and hard. The patient has been schedule for right upper extremity twice but no showed. The patient informed that she over slept for yesterday. The patient ask about the referral to the hand specialist and information for giving to her for Dr Peggye Ley at [facility]. Patient will be reaching to the referring office and will be reschedule for the procedure.

## 2020-08-09 ENCOUNTER — Encounter (INDEPENDENT_AMBULATORY_CARE_PROVIDER_SITE_OTHER): Payer: Self-pay

## 2020-08-09 ENCOUNTER — Encounter (INDEPENDENT_AMBULATORY_CARE_PROVIDER_SITE_OTHER): Payer: Self-pay | Admitting: Nurse Practitioner

## 2020-08-09 ENCOUNTER — Telehealth (INDEPENDENT_AMBULATORY_CARE_PROVIDER_SITE_OTHER): Payer: Self-pay

## 2020-08-09 NOTE — Telephone Encounter (Signed)
Patient called stating that is her surgery isnt going to be r/s'd she needs a note to return to work. She also was calling to check the status of referral sent from our office to [facility] (referral was sent 07/20/20 @ 11am). Patient has missed last two surgery dates that was scheduled.

## 2020-08-09 NOTE — Telephone Encounter (Signed)
The first thing is to get Suzanne [name] rescheduled.  This way we can at least give her a date for her procedure. Unfortunately, she didn't show for previous two dates so I'm unsure how soon it will be before she is rescheduled.  As far as a note to return to work, she can return whenever she wishes.  We advised her to go to urgent care yesterday.  There is no referral necessary.  She just walks in.  As far as the referral sent on 07/20/20, that was for the hand specialist.  She can reach out to [facility] to see if she can get an appointment if she hasn't heard from them yet.

## 2020-08-09 NOTE — Telephone Encounter (Signed)
Patient has being reschedule for right upper extremity angio on 08/16/20 with arrival time 8am. Covid-19 testing is schedule for 08/13/20 at [facility] between 8-2pm. Patient will receive pre-procedure instructions when she come by the office to pick her work note.

## 2020-08-13 ENCOUNTER — Telehealth (INDEPENDENT_AMBULATORY_CARE_PROVIDER_SITE_OTHER): Payer: Self-pay

## 2020-08-13 ENCOUNTER — Other Ambulatory Visit: Admission: RE | Admit: 2020-08-13 | Payer: Medicaid Other | Source: Ambulatory Visit

## 2020-08-13 NOTE — Telephone Encounter (Signed)
Patient left a voicemail stating that she was not able to get off in time to have a Covid testing today and ask if she could go tomorrow. I left a message on the patient voicemail stating that she will be reschedule for tomorrow and she will need to go since her procedure is schedule for 08/16/20. Patient ask about the approval for Eliquis and at this time we have not received anything from pharmacy.

## 2020-08-14 ENCOUNTER — Other Ambulatory Visit
Admission: RE | Admit: 2020-08-14 | Discharge: 2020-08-14 | Disposition: A | Discharge status: Home / Self Care | Payer: Medicaid Other | Source: Ambulatory Visit | Attending: Vascular Surgery | Admitting: Vascular Surgery

## 2020-08-14 ENCOUNTER — Other Ambulatory Visit: Payer: Self-pay

## 2020-08-14 DIAGNOSIS — Z01812 Encounter for preprocedural laboratory examination: Secondary | ICD-10-CM | POA: Diagnosis present

## 2020-08-14 DIAGNOSIS — Z20822 Contact with and (suspected) exposure to covid-19: Secondary | ICD-10-CM | POA: Insufficient documentation

## 2020-08-14 LAB — SARS CORONAVIRUS 2 (TAT 6-24 HRS): SARS Coronavirus 2: NEGATIVE

## 2020-08-15 ENCOUNTER — Other Ambulatory Visit (INDEPENDENT_AMBULATORY_CARE_PROVIDER_SITE_OTHER): Payer: Self-pay | Admitting: Nurse Practitioner

## 2020-08-16 ENCOUNTER — Ambulatory Visit: Admit: 2020-08-16 | Payer: Medicaid Other | Admitting: Vascular Surgery

## 2020-08-16 ENCOUNTER — Encounter
Admission: RE | Disposition: A | Discharge status: Home / Self Care | Payer: Self-pay | Source: Home / Self Care | Attending: Vascular Surgery

## 2020-08-16 ENCOUNTER — Other Ambulatory Visit: Payer: Self-pay

## 2020-08-16 ENCOUNTER — Ambulatory Visit
Admission: RE | Admit: 2020-08-16 | Discharge: 2020-08-16 | Disposition: A | Discharge status: Home / Self Care | Payer: Medicaid Other | Source: Home / Self Care | Attending: Vascular Surgery | Admitting: Vascular Surgery

## 2020-08-16 ENCOUNTER — Encounter: Payer: Self-pay | Admitting: Vascular Surgery

## 2020-08-16 DIAGNOSIS — X58XXXA Exposure to other specified factors, initial encounter: Secondary | ICD-10-CM | POA: Insufficient documentation

## 2020-08-16 DIAGNOSIS — Z7901 Long term (current) use of anticoagulants: Secondary | ICD-10-CM | POA: Insufficient documentation

## 2020-08-16 DIAGNOSIS — I998 Other disorder of circulatory system: Secondary | ICD-10-CM

## 2020-08-16 DIAGNOSIS — F17210 Nicotine dependence, cigarettes, uncomplicated: Secondary | ICD-10-CM | POA: Insufficient documentation

## 2020-08-16 DIAGNOSIS — I742 Embolism and thrombosis of arteries of the upper extremities: Secondary | ICD-10-CM | POA: Insufficient documentation

## 2020-08-16 DIAGNOSIS — I70218 Atherosclerosis of native arteries of extremities with intermittent claudication, other extremity: Secondary | ICD-10-CM | POA: Insufficient documentation

## 2020-08-16 DIAGNOSIS — L97909 Non-pressure chronic ulcer of unspecified part of unspecified lower leg with unspecified severity: Secondary | ICD-10-CM

## 2020-08-16 DIAGNOSIS — E785 Hyperlipidemia, unspecified: Secondary | ICD-10-CM | POA: Insufficient documentation

## 2020-08-16 DIAGNOSIS — I70299 Other atherosclerosis of native arteries of extremities, unspecified extremity: Secondary | ICD-10-CM

## 2020-08-16 DIAGNOSIS — Z7982 Long term (current) use of aspirin: Secondary | ICD-10-CM | POA: Insufficient documentation

## 2020-08-16 DIAGNOSIS — S60426A Blister (nonthermal) of right little finger, initial encounter: Secondary | ICD-10-CM | POA: Insufficient documentation

## 2020-08-16 HISTORY — PX: UPPER EXTREMITY ANGIOGRAPHY: CATH118270

## 2020-08-16 LAB — CREATININE, SERUM
Creatinine, Ser: 0.7 mg/dL (ref 0.44–1.00)
GFR, Estimated: >60 mL/min (ref >60)

## 2020-08-16 LAB — BUN: BUN: 7 mg/dL (ref 6–20)

## 2020-08-16 SURGERY — UPPER EXTREMITY ANGIOGRAPHY
Anesthesia: Moderate Sedation | Site: Arm Upper | Laterality: Right

## 2020-08-16 SURGERY — UPPER EXTREMITY ANGIOGRAPHY
Anesthesia: Moderate Sedation | Laterality: Left

## 2020-08-16 MED ORDER — FENTANYL CITRATE (PF) 100 MCG/2ML IJ SOLN
INTRAMUSCULAR | Status: AC
  Filled 2020-08-16: qty 2

## 2020-08-16 MED ORDER — NITROGLYCERIN 1 MG/10 ML FOR IR/CATH LAB
INTRA_ARTERIAL | Status: DC | PRN
  Administered 2020-08-16 (×2): 300 ug via INTRA_ARTERIAL

## 2020-08-16 MED ORDER — HYDRALAZINE HCL 20 MG/ML IJ SOLN
INTRAMUSCULAR | Status: AC
  Filled 2020-08-16: qty 1

## 2020-08-16 MED ORDER — CEFAZOLIN SODIUM-DEXTROSE 2-4 GM/100ML-% IV SOLN: 2.0000 g | Freq: Once | INTRAVENOUS | Status: DC

## 2020-08-16 MED ORDER — SODIUM CHLORIDE 0.9 % IV SOLN: INTRAVENOUS | Status: DC

## 2020-08-16 MED ORDER — HYDROCODONE-ACETAMINOPHEN 5-325 MG PO TABS
ORAL_TABLET | ORAL | Status: AC
  Filled 2020-08-16: qty 2

## 2020-08-16 MED ORDER — HYDROCODONE-ACETAMINOPHEN 5-325 MG PO TABS
2.0000 | ORAL_TABLET | Freq: Once | ORAL | Status: AC
  Administered 2020-08-16: 2 via ORAL

## 2020-08-16 MED ORDER — HYDROMORPHONE HCL 1 MG/ML IJ SOLN
INTRAMUSCULAR | Status: AC
  Filled 2020-08-16: qty 1

## 2020-08-16 MED ORDER — MIDAZOLAM HCL 5 MG/5ML IJ SOLN
INTRAMUSCULAR | Status: AC
  Filled 2020-08-16: qty 5

## 2020-08-16 MED ORDER — MIDAZOLAM HCL 2 MG/2ML IJ SOLN
INTRAMUSCULAR | Status: DC | PRN
  Administered 2020-08-16 (×5): 1 mg via INTRAVENOUS

## 2020-08-16 MED ORDER — HYDROCODONE-ACETAMINOPHEN 5-325 MG PO TABS: 1.0000 | ORAL_TABLET | Freq: Four times a day (QID) | ORAL | 0 refills | Status: DC | PRN

## 2020-08-16 MED ORDER — HYDROMORPHONE HCL 1 MG/ML IJ SOLN
1.0000 mg | Freq: Once | INTRAMUSCULAR | Status: AC | PRN
  Administered 2020-08-16: 1 mg via INTRAVENOUS

## 2020-08-16 MED ORDER — DIPHENHYDRAMINE HCL 50 MG/ML IJ SOLN: 50.0000 mg | Freq: Once | INTRAMUSCULAR | Status: DC | PRN

## 2020-08-16 MED ORDER — FENTANYL CITRATE (PF) 100 MCG/2ML IJ SOLN
INTRAMUSCULAR | Status: DC | PRN
  Administered 2020-08-16 (×4): 25 ug via INTRAVENOUS

## 2020-08-16 MED ORDER — IODIXANOL 320 MG/ML IV SOLN
INTRAVENOUS | Status: DC | PRN
  Administered 2020-08-16: 70 mL via INTRA_ARTERIAL

## 2020-08-16 MED ORDER — MIDAZOLAM HCL 2 MG/ML PO SYRP: 8.0000 mg | ORAL_SOLUTION | Freq: Once | ORAL | Status: DC | PRN

## 2020-08-16 MED ORDER — HEPARIN SODIUM (PORCINE) 1000 UNIT/ML IJ SOLN
INTRAMUSCULAR | Status: DC | PRN
  Administered 2020-08-16: 5000 [IU] via INTRAVENOUS

## 2020-08-16 MED ORDER — ONDANSETRON HCL 4 MG/2ML IJ SOLN: 4.0000 mg | Freq: Four times a day (QID) | INTRAMUSCULAR | Status: DC | PRN

## 2020-08-16 MED ORDER — FAMOTIDINE 20 MG PO TABS: 40.0000 mg | ORAL_TABLET | Freq: Once | ORAL | Status: DC | PRN

## 2020-08-16 MED ORDER — METHYLPREDNISOLONE SODIUM SUCC 125 MG IJ SOLR: 125.0000 mg | Freq: Once | INTRAMUSCULAR | Status: DC | PRN

## 2020-08-16 MED ORDER — CEFAZOLIN SODIUM-DEXTROSE 2-4 GM/100ML-% IV SOLN
INTRAVENOUS | Status: AC
  Filled 2020-08-16: qty 100

## 2020-08-16 MED ORDER — APIXABAN 5 MG PO TABS: 5.0000 mg | ORAL_TABLET | Freq: Two times a day (BID) | ORAL | 5 refills | Status: DC

## 2020-08-16 MED ORDER — HEPARIN SODIUM (PORCINE) 1000 UNIT/ML IJ SOLN
INTRAMUSCULAR | Status: AC
  Filled 2020-08-16: qty 1

## 2020-08-16 SURGICAL SUPPLY — 24 items
BALLN LUTONIX 018 4X300X130 (BALLOONS) ×2
BALLN ULTRVRSE 3X150X150 (BALLOONS) ×2
BALLOON LUTONIX 018 4X300X130 (BALLOONS) ×1 IMPLANT
BALLOON ULTRVRSE 3X150X150 (BALLOONS) ×1 IMPLANT
CANISTER PENUMBRA ENGINE (MISCELLANEOUS) ×2 IMPLANT
CATH ANGIO 5F PIGTAIL 100CM (CATHETERS) ×2 IMPLANT
CATH HEADHUNTER H1 5F 100CM (CATHETERS) ×2 IMPLANT
CATH INDIGO CAT3 KIT (CATHETERS) ×2 IMPLANT
CATH INDIGO CAT6 KIT (CATHETERS) ×2 IMPLANT
CATH VERT 5FR 125CM (CATHETERS) ×2 IMPLANT
COVER EZ STRL 42X30 (DRAPES) ×2 IMPLANT
COVER PROBE U/S 5X48 (MISCELLANEOUS) ×2 IMPLANT
DEVICE STARCLOSE SE CLOSURE (Vascular Products) ×2 IMPLANT
DEVICE TORQUE (MISCELLANEOUS) ×2 IMPLANT
GLIDEWIRE ADV .035X260CM (WIRE) ×2 IMPLANT
GLIDEWIRE ANGLED SS 035X260CM (WIRE) ×2 IMPLANT
KIT ENCORE 26 ADVANTAGE (KITS) ×2 IMPLANT
PACK ANGIOGRAPHY (CUSTOM PROCEDURE TRAY) ×2 IMPLANT
SHEATH BRITE TIP 5FRX11 (SHEATH) ×2 IMPLANT
SHEATH GUIDING CAROTID 6FRX90 (SHEATH) ×2 IMPLANT
SYR MEDRAD MARK 7 150ML (SYRINGE) ×2 IMPLANT
TUBING CONTRAST HIGH PRESS 48 (TUBING) ×4 IMPLANT
WIRE G V18X300CM (WIRE) ×2 IMPLANT
WIRE GUIDERIGHT .035X150 (WIRE) ×2 IMPLANT

## 2020-08-16 NOTE — Interval H&P Note (Signed)
History and Physical Interval Note:  08/16/2020 8:43 AM  Suzanne [name]  has presented today for surgery, with the diagnosis of RUE Angio  ASO w ulceration Covid April 7.  The various methods of treatment have been discussed with the patient and family. After consideration of risks, benefits and other options for treatment, the patient has consented to  Procedure(s): UPPER EXTREMITY ANGIOGRAPHY (Right) as a surgical intervention.  The patient's history has been reviewed, patient examined, no change in status, stable for surgery.  I have reviewed the patient's chart and labs.  Questions were answered to the patient's satisfaction.     [name]

## 2020-08-16 NOTE — Discharge Instructions (Signed)

## 2020-08-16 NOTE — Op Note (Signed)
OPERATIVE REPORT     PREOPERATIVE DIAGNOSIS: 1.  Right upper extremity ischemia   POSTOPERATIVE DIAGNOSIS: Same as above   PROCEDURE PERFORMED: 1. Ultrasound guidance vascular access to right femoral artery. 2. Catheter placement to right interosseous artery     from right femoral approach. 3. Thoracic aortogram and selective right upper extremity angiogram 4.  Mechanical thrombectomy to right brachial and interosseous arteries using the penumbra CAT 6 device and the penumbra CAT 3 device 5.  Percutaneous transluminal angioplasty of right interosseous artery with 3 mm diameter angioplasty balloon 6.  Percutaneous transluminal angioplasty of right brachial artery with a 4 mm diameter Lutonix drug-coated angioplasty balloon 7. StarClose closure device right femoral artery.   SURGEON:  Algernon Huxley, MD   ANESTHESIA:  Local with moderate conscious sedation for 72 minutes using 5 mg of Versed and 100 mcg of Fentanyl   BLOOD LOSS:  Minimal.   FLUOROSCOPY TIME:  14.4 minutes   INDICATION FOR PROCEDURE:  This is a 45 y.o.female who presented to our office with recurrent right upper extremity ischemia.  She now has a wound and this is poorly healing as well as pain in her hand.  She has already had open surgical revascularization and endovascular revascularization within the past year.  She has brought back for another angiogram to determine what options are available and attempt treatment if possible.  Risks and benefits are discussed.  Informed consent was obtained.   DESCRIPTION OF PROCEDURE:  The patient was brought to the vascular suite.  Moderate conscious sedation was administered during a face to face encounter with the patient throughout the procedure with my supervision of the RN administering medicines and monitoring the patient's vital signs, pulse oximetry, telemetry and mental status throughout from the start of the procedure until the patient was taken to the recovery room.   Groins were shaved and prepped and sterile surgical field was created.  The right femoral head was localized with fluoroscopy and the right femoral artery was then visualized with ultrasound and found to be widely patent.  It was then accessed under direct ultrasound guidance without difficulty with a Seldinger needle and a permanent image was recorded.  A J-wire and 5-French sheath were then placed.  Pigtail catheter was placed into the ascending aorta and a thoracic aortogram was then performed in the LAO projection. This demonstrated normal origins to the great vessels without significant proximal stenoses and a normal configuration of the great vessels.  The patient was given 5000 units of intravenous heparin and a Headhunter catheter was used to selectively cannulate the innominate artery and then advance into the right subclavian artery without difficulty.  This was advanced to the right axillary and proximal brachial artery where images was performed.  There was occlusion of the brachial artery a few centimeters proximal to previously placed stent.  There were a large number of collaterals that then fed down to an interosseous artery in the forearm.  Both the radial and ulnar arteries appear to be occluded and there was poor filling of the hand.  I then exchanged for a long 6 [place] destination sheath and parked this in the proximal brachial artery.  I then used a long Kumpe catheter and advantage wire to get into the occlusion down to cross the occlusion in the distal brachial artery and then into the interosseous artery where I exchanged for a 0.018 wire.  Mechanical thrombectomy was then performed to the right brachial artery and right interosseous artery with  the penumbra CAT 6 device.  3 passes were made with the CAT 6 device.  Following this, there was now at least a channel of flow but there was a high-grade stenosis in the brachial artery just proximal to the stent and high-grade stenosis in  the brachial artery distal to the stent as well as in the interosseous.  A 3 mm diameter by 30 cm length angioplasty balloon was used to treat the interosseous artery and then back into the brachial artery inflating this to 8 atm for 1 minute.  A 4 mm diameter by 30 cm length Lutonix drug-coated angioplasty balloon was used to treat the brachial artery both just distal to the stent as well as just proximal to the stent and within the stent.  This was inflated to 10 atm for 1 minute.  There remained occlusion in the interosseous artery and distal brachial artery.  I then used the penumbra CAT 3 device and went well into the interosseous artery as well as through the brachial artery now creating a channel of blood flow.  There is also significant vasospasm and several small doses of intra-arterial nitroglycerin were given.  I then gently inflated the 4 mm balloon in the proximal interosseous artery and distal brachial artery up to about 6 atm.  Following this in the intra-arterial nitroglycerin, there was flow down through the interosseous artery and the brachial artery with no greater than 30% residual stenosis seen in either artery.  The hand filling was still somewhat sluggish, and the patient was having significant discomfort so the image quality was extremely poor.  This was a marked improvement from her preoperative state. The diagnostic catheter was removed.  Oblique arteriogram was performed of the right femoral artery and StarClose closure device deployed in the usual fashion with excellent hemostatic result. The patient tolerated the procedure well and was taken to the recovery room in stable condition.    [name] 08/16/2020 11:51 AM

## 2020-08-17 ENCOUNTER — Inpatient Hospital Stay: Payer: Medicaid Other | Admitting: Anesthesiology

## 2020-08-17 ENCOUNTER — Encounter
Admission: EM | Disposition: A | Discharge status: Home / Self Care | Payer: Self-pay | Source: Home / Self Care | Attending: Vascular Surgery

## 2020-08-17 ENCOUNTER — Inpatient Hospital Stay
Admission: EM | Admit: 2020-08-17 | Discharge: 2020-08-20 | DRG: 253 | Disposition: A | Discharge status: Home / Self Care | Payer: Medicaid Other | Attending: Vascular Surgery | Admitting: Vascular Surgery

## 2020-08-17 ENCOUNTER — Encounter: Payer: Self-pay | Admitting: Vascular Surgery

## 2020-08-17 ENCOUNTER — Other Ambulatory Visit: Payer: Self-pay

## 2020-08-17 DIAGNOSIS — I998 Other disorder of circulatory system: Secondary | ICD-10-CM

## 2020-08-17 DIAGNOSIS — T82868A Thrombosis of vascular prosthetic devices, implants and grafts, initial encounter: Principal | ICD-10-CM | POA: Diagnosis present

## 2020-08-17 DIAGNOSIS — I10 Essential (primary) hypertension: Secondary | ICD-10-CM | POA: Diagnosis present

## 2020-08-17 DIAGNOSIS — I160 Hypertensive urgency: Secondary | ICD-10-CM | POA: Diagnosis present

## 2020-08-17 DIAGNOSIS — Y713 Surgical instruments, materials and cardiovascular devices (including sutures) associated with adverse incidents: Secondary | ICD-10-CM | POA: Diagnosis present

## 2020-08-17 DIAGNOSIS — Z7901 Long term (current) use of anticoagulants: Secondary | ICD-10-CM

## 2020-08-17 DIAGNOSIS — I96 Gangrene, not elsewhere classified: Secondary | ICD-10-CM | POA: Diagnosis present

## 2020-08-17 DIAGNOSIS — I742 Embolism and thrombosis of arteries of the upper extremities: Secondary | ICD-10-CM

## 2020-08-17 DIAGNOSIS — Z20822 Contact with and (suspected) exposure to covid-19: Secondary | ICD-10-CM | POA: Diagnosis present

## 2020-08-17 DIAGNOSIS — I70228 Atherosclerosis of native arteries of extremities with rest pain, other extremity: Secondary | ICD-10-CM | POA: Diagnosis present

## 2020-08-17 DIAGNOSIS — Z7982 Long term (current) use of aspirin: Secondary | ICD-10-CM | POA: Diagnosis not present

## 2020-08-17 DIAGNOSIS — M79603 Pain in arm, unspecified: Secondary | ICD-10-CM | POA: Diagnosis not present

## 2020-08-17 DIAGNOSIS — E785 Hyperlipidemia, unspecified: Secondary | ICD-10-CM | POA: Diagnosis present

## 2020-08-17 DIAGNOSIS — I9789 Other postprocedural complications and disorders of the circulatory system, not elsewhere classified: Secondary | ICD-10-CM | POA: Diagnosis present

## 2020-08-17 DIAGNOSIS — Z8249 Family history of ischemic heart disease and other diseases of the circulatory system: Secondary | ICD-10-CM

## 2020-08-17 DIAGNOSIS — F17210 Nicotine dependence, cigarettes, uncomplicated: Secondary | ICD-10-CM | POA: Diagnosis present

## 2020-08-17 HISTORY — PX: BYPASS AXILLA/BRACHIAL ARTERY: SHX6426

## 2020-08-17 HISTORY — PX: THROMBECTOMY BRACHIAL ARTERY: SHX6649

## 2020-08-17 HISTORY — PX: ENDARTERECTOMY: SHX5162

## 2020-08-17 LAB — CBC
HCT: 34.2 % — ABNORMAL LOW (ref 36.0–46.0)
Hemoglobin: 10.7 g/dL — ABNORMAL LOW (ref 12.0–15.0)
MCH: 24.9 pg — ABNORMAL LOW (ref 26.0–34.0)
MCHC: 31.3 g/dL (ref 30.0–36.0)
MCV: 79.5 fL — ABNORMAL LOW (ref 80.0–100.0)
Platelets: 423 10*3/uL — ABNORMAL HIGH (ref 150–400)
RBC: 4.3 MIL/uL (ref 3.87–5.11)
RDW: 18.4 % — ABNORMAL HIGH (ref 11.5–15.5)
WBC: 16.4 10*3/uL — ABNORMAL HIGH (ref 4.0–10.5)
nRBC: 0 % (ref 0.0–0.2)

## 2020-08-17 LAB — PROTIME-INR
INR: 1.1 (ref 0.8–1.2)
Prothrombin Time: 13.7 seconds (ref 11.4–15.2)

## 2020-08-17 LAB — COMPREHENSIVE METABOLIC PANEL
ALT: 9 U/L (ref 0–44)
AST: 26 U/L (ref 15–41)
Albumin: 3.7 g/dL (ref 3.5–5.0)
Alkaline Phosphatase: 66 U/L (ref 38–126)
Anion gap: 11 (ref 5–15)
BUN: 7 mg/dL (ref 6–20)
CO2: 18 mmol/L — ABNORMAL LOW (ref 22–32)
Calcium: 8.6 mg/dL — ABNORMAL LOW (ref 8.9–10.3)
Chloride: 103 mmol/L (ref 98–111)
Creatinine, Ser: 0.89 mg/dL (ref 0.44–1.00)
GFR, Estimated: >60 mL/min (ref >60)
Glucose, Bld: 146 mg/dL — ABNORMAL HIGH (ref 70–99)
Potassium: 2.9 mmol/L — ABNORMAL LOW (ref 3.5–5.1)
Sodium: 132 mmol/L — ABNORMAL LOW (ref 135–145)
Total Bilirubin: 0.8 mg/dL (ref 0.3–1.2)
Total Protein: 7.5 g/dL (ref 6.5–8.1)

## 2020-08-17 LAB — ABO/RH: ABO/RH(D): B NEG

## 2020-08-17 LAB — SAMPLE TO BLOOD BANK

## 2020-08-17 LAB — HEPARIN LEVEL (UNFRACTIONATED): Heparin Unfractionated: <0.1 IU/mL — ABNORMAL LOW (ref 0.30–0.70)

## 2020-08-17 LAB — RESP PANEL BY RT-PCR (FLU A&B, COVID) ARPGX2
Influenza A by PCR: NEGATIVE
Influenza B by PCR: NEGATIVE
SARS Coronavirus 2 by RT PCR: NEGATIVE

## 2020-08-17 LAB — APTT: aPTT: 26 seconds (ref 24–36)

## 2020-08-17 SURGERY — THROMBECTOMY, ARTERY, BRACHIAL
Anesthesia: General | Laterality: Right

## 2020-08-17 MED ORDER — HEPARIN SODIUM (PORCINE) 5000 UNIT/ML IJ SOLN
INTRAMUSCULAR | Status: AC
  Filled 2020-08-17: qty 1

## 2020-08-17 MED ORDER — OXYCODONE HCL 5 MG PO TABS: 5.0000 mg | ORAL_TABLET | Freq: Once | ORAL | Status: DC | PRN

## 2020-08-17 MED ORDER — ROCURONIUM BROMIDE 100 MG/10ML IV SOLN
INTRAVENOUS | Status: DC | PRN
  Administered 2020-08-17: 10 mg via INTRAVENOUS
  Administered 2020-08-17: 40 mg via INTRAVENOUS
  Administered 2020-08-17: 10 mg via INTRAVENOUS

## 2020-08-17 MED ORDER — FENTANYL CITRATE (PF) 100 MCG/2ML IJ SOLN
INTRAMUSCULAR | Status: AC
  Administered 2020-08-17: 50 ug via INTRAVENOUS
  Filled 2020-08-17: qty 2

## 2020-08-17 MED ORDER — CEFAZOLIN SODIUM-DEXTROSE 2-4 GM/100ML-% IV SOLN
2.0000 g | INTRAVENOUS | Status: AC
  Administered 2020-08-17: 2 g via INTRAVENOUS
  Filled 2020-08-17: qty 100

## 2020-08-17 MED ORDER — FENTANYL CITRATE (PF) 100 MCG/2ML IJ SOLN
50.0000 ug | Freq: Once | INTRAMUSCULAR | Status: DC
Start: 2020-08-17 — End: 2020-08-18

## 2020-08-17 MED ORDER — FENTANYL CITRATE (PF) 100 MCG/2ML IJ SOLN: 50.0000 ug | Freq: Once | INTRAMUSCULAR | Status: AC

## 2020-08-17 MED ORDER — FENTANYL CITRATE (PF) 100 MCG/2ML IJ SOLN
INTRAMUSCULAR | Status: AC
  Administered 2020-08-17: 50 ug
  Filled 2020-08-17: qty 2

## 2020-08-17 MED ORDER — DEXMEDETOMIDINE (PRECEDEX) IN NS 20 MCG/5ML (4 MCG/ML) IV SYRINGE
PREFILLED_SYRINGE | INTRAVENOUS | Status: DC | PRN
  Administered 2020-08-17: 12 ug via INTRAVENOUS
  Administered 2020-08-17: 8 ug via INTRAVENOUS

## 2020-08-17 MED ORDER — MIDAZOLAM HCL 2 MG/2ML IJ SOLN
INTRAMUSCULAR | Status: AC
  Filled 2020-08-17: qty 2

## 2020-08-17 MED ORDER — OXYCODONE HCL 5 MG/5ML PO SOLN: 5.0000 mg | Freq: Once | ORAL | Status: DC | PRN

## 2020-08-17 MED ORDER — BUPIVACAINE HCL (PF) 0.5 % IJ SOLN
INTRAMUSCULAR | Status: AC
  Filled 2020-08-17: qty 30

## 2020-08-17 MED ORDER — HEPARIN SODIUM (PORCINE) 1000 UNIT/ML IJ SOLN
INTRAMUSCULAR | Status: AC
  Filled 2020-08-17: qty 1

## 2020-08-17 MED ORDER — DEXAMETHASONE SODIUM PHOSPHATE 10 MG/ML IJ SOLN
INTRAMUSCULAR | Status: AC
  Filled 2020-08-17: qty 1

## 2020-08-17 MED ORDER — PROMETHAZINE HCL 25 MG/ML IJ SOLN
INTRAMUSCULAR | Status: AC
  Filled 2020-08-17: qty 1

## 2020-08-17 MED ORDER — HYDRALAZINE HCL 20 MG/ML IJ SOLN
10.0000 mg | Freq: Once | INTRAMUSCULAR | Status: AC
  Administered 2020-08-17: 10 mg via INTRAVENOUS

## 2020-08-17 MED ORDER — ONDANSETRON HCL 4 MG/2ML IJ SOLN
INTRAMUSCULAR | Status: DC | PRN
  Administered 2020-08-17: 4 mg via INTRAVENOUS

## 2020-08-17 MED ORDER — DEXMEDETOMIDINE (PRECEDEX) IN NS 20 MCG/5ML (4 MCG/ML) IV SYRINGE
PREFILLED_SYRINGE | INTRAVENOUS | Status: AC
  Filled 2020-08-17: qty 5

## 2020-08-17 MED ORDER — FENTANYL CITRATE (PF) 100 MCG/2ML IJ SOLN
INTRAMUSCULAR | Status: AC
  Filled 2020-08-17: qty 2

## 2020-08-17 MED ORDER — HEPARIN (PORCINE) 25000 UT/250ML-% IV SOLN
900.0000 [IU]/h | INTRAVENOUS | Status: DC
  Administered 2020-08-17: 900 [IU]/h via INTRAVENOUS
  Filled 2020-08-17: qty 250

## 2020-08-17 MED ORDER — SUGAMMADEX SODIUM 200 MG/2ML IV SOLN
INTRAVENOUS | Status: DC | PRN
  Administered 2020-08-17: 200 mg via INTRAVENOUS

## 2020-08-17 MED ORDER — HYDROMORPHONE HCL 1 MG/ML IJ SOLN
0.2500 mg | INTRAMUSCULAR | Status: DC | PRN
  Administered 2020-08-17: 0.5 mg via INTRAVENOUS

## 2020-08-17 MED ORDER — FENTANYL CITRATE (PF) 100 MCG/2ML IJ SOLN
INTRAMUSCULAR | Status: DC | PRN
  Administered 2020-08-17 (×4): 50 ug via INTRAVENOUS

## 2020-08-17 MED ORDER — HYDRALAZINE HCL 20 MG/ML IJ SOLN
INTRAMUSCULAR | Status: AC
  Administered 2020-08-17: 10 mg via INTRAVENOUS
  Filled 2020-08-17: qty 1

## 2020-08-17 MED ORDER — SUCCINYLCHOLINE CHLORIDE 200 MG/10ML IV SOSY
PREFILLED_SYRINGE | INTRAVENOUS | Status: AC
  Filled 2020-08-17: qty 10

## 2020-08-17 MED ORDER — HEPARIN SODIUM (PORCINE) 1000 UNIT/ML IJ SOLN
INTRAMUSCULAR | Status: DC | PRN
  Administered 2020-08-17: 4000 [IU] via INTRAVENOUS
  Administered 2020-08-17: 3000 [IU] via INTRAVENOUS

## 2020-08-17 MED ORDER — BUPIVACAINE LIPOSOME 1.3 % IJ SUSP
INTRAMUSCULAR | Status: DC | PRN
  Administered 2020-08-17: 10 mL

## 2020-08-17 MED ORDER — HYDRALAZINE HCL 20 MG/ML IJ SOLN: 10.0000 mg | Freq: Once | INTRAMUSCULAR | Status: AC

## 2020-08-17 MED ORDER — BUPIVACAINE LIPOSOME 1.3 % IJ SUSP
INTRAMUSCULAR | Status: AC
  Filled 2020-08-17: qty 20

## 2020-08-17 MED ORDER — SODIUM CHLORIDE (PF) 0.9 % IJ SOLN
INTRAMUSCULAR | Status: AC
  Filled 2020-08-17: qty 50

## 2020-08-17 MED ORDER — PROPOFOL 10 MG/ML IV BOLUS
INTRAVENOUS | Status: DC | PRN
  Administered 2020-08-17: 50 mg via INTRAVENOUS
  Administered 2020-08-17: 150 mg via INTRAVENOUS

## 2020-08-17 MED ORDER — PROPOFOL 10 MG/ML IV BOLUS
INTRAVENOUS | Status: AC
  Filled 2020-08-17: qty 20

## 2020-08-17 MED ORDER — PROMETHAZINE HCL 25 MG/ML IJ SOLN: 6.2500 mg | INTRAMUSCULAR | Status: DC | PRN

## 2020-08-17 MED ORDER — SUCCINYLCHOLINE CHLORIDE 20 MG/ML IJ SOLN
INTRAMUSCULAR | Status: DC | PRN
  Administered 2020-08-17: 100 mg via INTRAVENOUS

## 2020-08-17 MED ORDER — PROMETHAZINE HCL 25 MG/ML IJ SOLN
12.5000 mg | Freq: Once | INTRAMUSCULAR | Status: AC
  Administered 2020-08-17: 12.5 mg via INTRAVENOUS

## 2020-08-17 MED ORDER — DEXAMETHASONE SODIUM PHOSPHATE 10 MG/ML IJ SOLN
INTRAMUSCULAR | Status: DC | PRN
  Administered 2020-08-17: 10 mg via INTRAVENOUS

## 2020-08-17 MED ORDER — BUPIVACAINE HCL (PF) 0.5 % IJ SOLN
INTRAMUSCULAR | Status: DC | PRN
  Administered 2020-08-17: 20 mL

## 2020-08-17 MED ORDER — LIDOCAINE HCL (CARDIAC) PF 100 MG/5ML IV SOSY
PREFILLED_SYRINGE | INTRAVENOUS | Status: DC | PRN
  Administered 2020-08-17: 50 mg via INTRAVENOUS

## 2020-08-17 MED ORDER — CEFAZOLIN SODIUM-DEXTROSE 2-4 GM/100ML-% IV SOLN
INTRAVENOUS | Status: AC
  Administered 2020-08-18: 2 g via INTRAVENOUS
  Filled 2020-08-17: qty 100

## 2020-08-17 MED ORDER — ONDANSETRON HCL 4 MG/2ML IJ SOLN
INTRAMUSCULAR | Status: AC
  Filled 2020-08-17: qty 2

## 2020-08-17 MED ORDER — FENTANYL CITRATE (PF) 100 MCG/2ML IJ SOLN
25.0000 ug | INTRAMUSCULAR | Status: DC | PRN
Start: 2020-08-17 — End: 2020-08-18

## 2020-08-17 MED ORDER — MIDAZOLAM HCL 2 MG/2ML IJ SOLN
INTRAMUSCULAR | Status: DC | PRN
  Administered 2020-08-17: 2 mg via INTRAVENOUS

## 2020-08-17 MED ORDER — HYDROMORPHONE HCL 1 MG/ML IJ SOLN
INTRAMUSCULAR | Status: AC
  Administered 2020-08-18: 0.5 mg via INTRAVENOUS
  Filled 2020-08-17: qty 1

## 2020-08-17 MED ORDER — LIDOCAINE HCL (PF) 2 % IJ SOLN
INTRAMUSCULAR | Status: AC
  Filled 2020-08-17: qty 5

## 2020-08-17 MED ORDER — LACTATED RINGERS IV SOLN: INTRAVENOUS | Status: DC | PRN

## 2020-08-17 MED ORDER — FENTANYL CITRATE (PF) 100 MCG/2ML IJ SOLN: 100.0000 ug | Freq: Once | INTRAMUSCULAR | Status: AC

## 2020-08-17 MED ORDER — FIBRIN SEALANT 2 ML SINGLE DOSE KIT
PACK | CUTANEOUS | Status: DC | PRN
  Administered 2020-08-17: 10 mL via TOPICAL

## 2020-08-17 MED ORDER — FENTANYL CITRATE (PF) 100 MCG/2ML IJ SOLN
INTRAMUSCULAR | Status: AC
  Administered 2020-08-17: 100 ug via INTRAVENOUS
  Filled 2020-08-17: qty 2

## 2020-08-17 MED ORDER — FENTANYL CITRATE (PF) 100 MCG/2ML IJ SOLN: 25.0000 ug | INTRAMUSCULAR | Status: DC | PRN

## 2020-08-17 MED ORDER — FENTANYL CITRATE (PF) 100 MCG/2ML IJ SOLN
50.0000 ug | Freq: Once | INTRAMUSCULAR | Status: AC
Start: 2020-08-17 — End: 2020-08-17
  Administered 2020-08-17: 50 ug via INTRAVENOUS

## 2020-08-17 MED ORDER — FENTANYL CITRATE (PF) 100 MCG/2ML IJ SOLN
50.0000 ug | Freq: Once | INTRAMUSCULAR | Status: AC
  Administered 2020-08-17: 50 ug via INTRAVENOUS

## 2020-08-17 MED ORDER — HEMOSTATIC AGENTS (NO CHARGE) OPTIME
TOPICAL | Status: DC | PRN
  Administered 2020-08-17: 1 via TOPICAL

## 2020-08-17 SURGICAL SUPPLY — 74 items
APPLIER CLIP 9.375 SM OPEN (CLIP)
BAG DECANTER FOR FLEXI CONT (MISCELLANEOUS) ×2 IMPLANT
BLADE SURG 15 STRL LF DISP TIS (BLADE) ×1 IMPLANT
BLADE SURG 15 STRL SS (BLADE) ×1
BLADE SURG SZ11 CARB STEEL (BLADE) ×2 IMPLANT
BOOT SUTURE AID YELLOW STND (SUTURE) ×2 IMPLANT
BRUSH SCRUB EZ  4% CHG (MISCELLANEOUS) ×1
BRUSH SCRUB EZ 4% CHG (MISCELLANEOUS) ×1 IMPLANT
CANISTER SUCT 1200ML W/VALVE (MISCELLANEOUS) ×2 IMPLANT
CATH EMBOLECTOMY 2X60 (CATHETERS) ×4 IMPLANT
CHLORAPREP W/TINT 26 (MISCELLANEOUS) ×4 IMPLANT
CLIP APPLIE 9.375 SM OPEN (CLIP) IMPLANT
COMPONENT HERO ACCESSORY KIT (VASCULAR PRODUCTS) ×2 IMPLANT
COVER PROBE FLX POLY STRL (MISCELLANEOUS) ×2 IMPLANT
COVER WAND RF STERILE (DRAPES) ×2 IMPLANT
DERMABOND ADVANCED (GAUZE/BANDAGES/DRESSINGS) ×2
DERMABOND ADVANCED .7 DNX12 (GAUZE/BANDAGES/DRESSINGS) ×2 IMPLANT
DRAPE 3/4 80X56 (DRAPES) ×2 IMPLANT
DRAPE C-ARM XRAY 36X54 (DRAPES) ×2 IMPLANT
DRAPE EXTREMITY 106X87X128.5 (DRAPES) ×2 IMPLANT
DRAPE IMP U-DRAPE 54X76 (DRAPES) ×2 IMPLANT
DRAPE INCISE IOBAN 66X45 STRL (DRAPES) ×2 IMPLANT
DRESSING SURGICEL FIBRLLR 1X2 (HEMOSTASIS) ×1 IMPLANT
DRSG SURGICEL FIBRILLAR 1X2 (HEMOSTASIS) ×2
ELECT CAUTERY BLADE 6.4 (BLADE) ×4 IMPLANT
ELECT REM PT RETURN 9FT ADLT (ELECTROSURGICAL) ×2
ELECTRODE REM PT RTRN 9FT ADLT (ELECTROSURGICAL) ×1 IMPLANT
GLOVE SURG ENC MOIS LTX SZ7 (GLOVE) ×2 IMPLANT
GLOVE SURG SYN 8.0 (GLOVE) ×2 IMPLANT
GLOVE SURG UNDER LTX SZ7.5 (GLOVE) ×2 IMPLANT
GOWN STRL REUS W/ TWL LRG LVL3 (GOWN DISPOSABLE) ×1 IMPLANT
GOWN STRL REUS W/ TWL XL LVL3 (GOWN DISPOSABLE) ×2 IMPLANT
GOWN STRL REUS W/TWL LRG LVL3 (GOWN DISPOSABLE) ×1
GOWN STRL REUS W/TWL XL LVL3 (GOWN DISPOSABLE) ×2
GRAFT COLLAGEN VASCULAR 7X45 (Vascular Products) ×2 IMPLANT
IV NS 500ML (IV SOLUTION) ×1
IV NS 500ML BAXH (IV SOLUTION) ×1 IMPLANT
KIT TURNOVER KIT A (KITS) ×2 IMPLANT
LABEL OR SOLS (LABEL) ×2 IMPLANT
LOOP RED MAXI  1X406MM (MISCELLANEOUS) ×2
LOOP VESSEL MAXI 1X406 RED (MISCELLANEOUS) ×2 IMPLANT
LOOP VESSEL MINI 0.8X406 BLUE (MISCELLANEOUS) ×1 IMPLANT
LOOPS BLUE MINI 0.8X406MM (MISCELLANEOUS) ×1
MANIFOLD NEPTUNE II (INSTRUMENTS) ×2 IMPLANT
NEEDLE FILTER BLUNT 18X 1/2SAF (NEEDLE) ×1
NEEDLE FILTER BLUNT 18X1 1/2 (NEEDLE) ×1 IMPLANT
NEEDLE HYPO 25X1 1.5 SAFETY (NEEDLE) IMPLANT
NS IRRIG 500ML POUR BTL (IV SOLUTION) ×2 IMPLANT
PACK BASIN MAJOR ARMC (MISCELLANEOUS) ×2 IMPLANT
PACK UNIVERSAL (MISCELLANEOUS) ×2 IMPLANT
PAD PREP 24X41 OB/GYN DISP (PERSONAL CARE ITEMS) ×2 IMPLANT
PENCIL ELECTRO HAND CTR (MISCELLANEOUS) ×2 IMPLANT
STOCKINETTE STRL 4IN 9604848 (GAUZE/BANDAGES/DRESSINGS) ×2 IMPLANT
STOCKINETTE STRL 6IN 960660 (GAUZE/BANDAGES/DRESSINGS) ×2 IMPLANT
SUT ETHIBOND 0 (SUTURE) IMPLANT
SUT GTX CV-6 30 (SUTURE) ×6 IMPLANT
SUT MNCRL AB 4-0 PS2 18 (SUTURE) ×2 IMPLANT
SUT MNCRL+ 5-0 UNDYED PC-3 (SUTURE) ×2 IMPLANT
SUT MONOCRYL 5-0 (SUTURE) ×2
SUT PROLENE 6 0 BV (SUTURE) ×20 IMPLANT
SUT SILK 2 0 (SUTURE) ×1
SUT SILK 2 0 SH (SUTURE) ×4 IMPLANT
SUT SILK 2-0 18XBRD TIE 12 (SUTURE) ×1 IMPLANT
SUT SILK 3 0 (SUTURE) ×1
SUT SILK 3-0 18XBRD TIE 12 (SUTURE) ×1 IMPLANT
SUT SILK 4 0 (SUTURE) ×1
SUT SILK 4-0 18XBRD TIE 12 (SUTURE) ×1 IMPLANT
SUT VIC AB 3-0 SH 27 (SUTURE) ×2
SUT VIC AB 3-0 SH 27X BRD (SUTURE) ×2 IMPLANT
SYR 10ML LL (SYRINGE) IMPLANT
SYR 20ML LL LF (SYRINGE) ×4 IMPLANT
SYR 5ML LL (SYRINGE) ×2 IMPLANT
SYR TOOMEY IRRIG 70ML (MISCELLANEOUS) ×2
SYRINGE TOOMEY IRRIG 70ML (MISCELLANEOUS) ×1 IMPLANT

## 2020-08-17 NOTE — ED Notes (Signed)
Pt changed into gown, all belongings placed into white patient belonging bag and left with patient.  Belongings include: Cell phone Cell phone charger Black pants T-shirt Two tennis shoes Belly button ring

## 2020-08-17 NOTE — ED Notes (Signed)
Pt unable to sign MSE waiver due to condition  

## 2020-08-17 NOTE — Consult Note (Signed)
ANTICOAGULATION CONSULT NOTE  Pharmacy Consult for Heparin  Indication: arterial occlusion  No Known Allergies  Patient Measurements: Height: 5\' 7"  (170.2 cm) Weight: 57.6 kg (127 lb) IBW/kg (Calculated) : 61.6 Heparin Dosing Weight: 57.6 kg   Vital Signs:    Labs: Recent Labs    08/16/20 0924  CREATININE 0.70    Estimated Creatinine Clearance: 82.5 mL/min (by C-G formula based on SCr of 0.7 mg/dL).   Medical History: Past Medical History:  Diagnosis Date  . 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 CRPS (complex regional pain syndrome type I) 12/2019  . 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 fentaNYL       Infusions:   PRN:  Anti-infectives (From admission, onward)   None      Assessment: Pharmacy consulted to start heparin for arterial occlusion. Apixaban PTA. Will use aPTT level to monitor heparin. Will order baseline heparin level if normal will switch to heparin level monitoring.   Goal of Therapy:  Heparin level 0.3-0.7 units/ml Monitor platelets by anticoagulation protocol: Yes   Plan:  Start heparin infusion at 900 units/hr Check aPTT level in 6 hours and daily while on heparin Continue to monitor H&H and platelets  Oswald Hillock, PharmD, BCPS 08/17/2020,4:40 PM

## 2020-08-17 NOTE — Transfer of Care (Addendum)
Immediate Anesthesia Transfer of Care Note  Patient: Suzanne [name]  Procedure(s) Performed: THROMBECTOMY BRACHIAL ARTERY (Right ) ENDARTERECTOMY BRACHIAL (Right ) BYPASS AXILLA/BRACHIAL ARTERY (Right )  Patient Location: PACU  Anesthesia Type:General  Level of Consciousness: awake  Airway & Oxygen Therapy: Patient Spontanous Breathing and Patient connected to face mask oxygen  Post-op Assessment: Report given to RN and Post -op Vital signs reviewed and stable  Post vital signs: Reviewed  Last Vitals:  Vitals Value Taken Time  BP    Temp    Pulse    Resp    SpO2      Last Pain:  Vitals:   08/17/20 1800  TempSrc:   PainSc: 10-Worst pain ever         Complications: No complications documented.

## 2020-08-17 NOTE — ED Notes (Addendum)
Pt to ED stating that she had a stent placed in right arm yesterday, pt states that about 1 hour PTA she felt something pop in her arm and she started having uncontrollable pain and decreased sensation in her right arm. Pt yelling out in pain

## 2020-08-17 NOTE — H&P (Signed)
@LOGO @   MRN : [id number]  Suzanne [name] is a 45 y.o. (06/10/1975) female who presents with chief complaint of  Chief Complaint  Patient presents with  . Post-op Problem  .  History of Present Illness:   I am called to the ER by Dr. Kerman Passey to evaluate the patient for an ischemic hand.  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  Post procedure the patient had significant relief and was discharged home.  Today she describes feeling a pop in the mid afternoon and suddenly her hand becoming exquisitely painful and quite pale.  She now rates her pain as the most horrific pain she has ever had she has received 200 mcg of fentanyl as well as other narcotics and this has had no effect as far as mitigating the pain per the patient.  No outpatient medications have been marked as taking for the 08/17/20 encounter [facility] Encounter).    Past Medical History:  Diagnosis Date  . 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 CRPS (complex regional pain syndrome type I) 12/2019  . 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 CESAREAN SECTION     times 4  . EMBOLECTOMY Right 03/09/2020   Procedure: Brachial EMBOLECTOMY;  Surgeon: Algernon Huxley, MD;  Location: [facility] ORS;  Service: Vascular;  Laterality: Right;  . UPPER EXTREMITY ANGIOGRAPHY Right 05/28/2020   Procedure: UPPER EXTREMITY ANGIOGRAPHY;  Surgeon: Algernon Huxley, MD;  Location: [place] [facility];  Service: Cardiovascular;  Laterality: Right;  . UPPER EXTREMITY ANGIOGRAPHY Right 08/16/2020   Procedure: UPPER EXTREMITY ANGIOGRAPHY;  Surgeon: Algernon Huxley, MD;  Location: [place] [facility];  Service: Cardiovascular;  Laterality: Right;    Social History Social History   Tobacco Use  . Smoking  status: Former Smoker    Packs/day: 0.25    Years: 20.00    Pack years: 5.00    Types: Cigarettes    Quit date: 07/15/2020    Years since quitting: 0.0  . Smokeless tobacco: Never Used  Vaping Use  . Vaping Use: Never used  Substance Use Topics  . Alcohol use: Not Currently    Comment: occ  . Drug use: No    Family History Family History  Adopted: Yes  Problem Relation Age of Onset  . Hypertension Mother     No Known Allergies   REVIEW OF SYSTEMS (Negative unless checked)  Constitutional: [] Weight loss  [] Fever  [] Chills Cardiac: [] Chest pain   [] Chest pressure   [] Palpitations   [] Shortness of breath when laying flat   [] Shortness of breath with exertion. Vascular:  [] Pain in legs with walking   [] Pain in legs at rest  [] History of DVT   [] Phlebitis   [] Swelling in legs   [] Varicose veins   [] Non-healing ulcers Pulmonary:   [] Uses home oxygen   [] Productive cough   [] Hemoptysis   [] Wheeze  [] COPD   [] Asthma Neurologic:  [] Dizziness   [] Seizures   [] History of stroke   [] History of TIA  [] Aphasia   [] Vissual changes   [] Weakness or numbness in arm   [] Weakness or numbness in leg Musculoskeletal:   [] Joint swelling   [] Joint pain   [] Low back pain Hematologic:  [] Easy bruising  [] Easy bleeding   [] Hypercoagulable state   [] Anemic  Gastrointestinal:  [] Diarrhea   [] Vomiting  [] Gastroesophageal reflux/heartburn   [] Difficulty swallowing. Genitourinary:  [] Chronic kidney disease   [] Difficult urination  [] Frequent urination   [] Blood in urine Skin:  [] Rashes   [] Ulcers  Psychological:  [] History of anxiety   []  History of major depression.  Physical Examination  Vitals:   08/17/20 1715 08/17/20 1730 08/17/20 1745 08/17/20 1800  BP:  (!) 207/81  (!) 181/94  Pulse: 94 85 92 100  Resp:    (!) 22  Temp:      TempSrc:      SpO2: 100% 100% 100% 100%  Weight:      Height:       Body mass index is 19.89 kg/m. Gen: WD/WN, NAD Head: [place]/AT, No temporalis wasting.   Ear/Nose/Throat: Hearing grossly intact, nares w/o erythema or drainage Eyes: PER, EOMI, sclera nonicteric.  Neck: Supple, no large masses.   Pulmonary:  Good air movement, no audible wheezing bilaterally, no use of accessory muscles.  Cardiac: RRR, no JVD Vascular: The right hand is pale and has poor sensation and no motor function it is cold to the touch.  There is no pulse in the antecubital fossa Vessel Right Left  Radial  not palpable Palpable  Ulnar  not palpable Palpable  Brachial  not palpable Palpable  Carotid Palpable Palpable  Gastrointestinal: Non-distended. No guarding/no peritoneal signs.  Musculoskeletal: M/S 5/5 throughout.  No deformity or atrophy.  Neurologic: CN 2-12 intact. Symmetrical.  Speech is fluent. Motor exam as listed above. Psychiatric: Judgment intact, Mood & affect appropriate for pt's clinical situation. Dermatologic: No rashes or ulcers noted.  No changes consistent with cellulitis.  CBC Lab Results  Component Value Date   WBC 16.4 (H) 08/17/2020   HGB 10.7 (L) 08/17/2020   HCT 34.2 (L) 08/17/2020   MCV 79.5 (L) 08/17/2020   PLT 423 (H) 08/17/2020    BMET    Component Value Date/Time   NA 132 (L) 08/17/2020 1622   NA 140 04/23/2011 1538   K 2.9 (L) 08/17/2020 1622   K 3.9 04/23/2011 1538   CL 103 08/17/2020 1622   CL 105 04/23/2011 1538   CO2 18 (L) 08/17/2020 1622   CO2 25 04/23/2011 1538   GLUCOSE 146 (H) 08/17/2020 1622   GLUCOSE 117 (H) 04/23/2011 1538   BUN 7 08/17/2020 1622   BUN 2 (L) 04/23/2011 1538   CREATININE 0.89 08/17/2020 1622   CREATININE 0.63 04/23/2011 1538   CALCIUM 8.6 (L) 08/17/2020 1622   CALCIUM 8.9 04/23/2011 1538   GFRNONAA >60 08/17/2020 1622   GFRNONAA >60 04/23/2011 1538   GFRAA >60 12/22/2018 0839   GFRAA >60 04/23/2011 1538   Estimated Creatinine Clearance: 74.1 mL/min (by C-G formula based on SCr of 0.89 mg/dL).  COAG Lab Results  Component Value Date   INR 1.1 08/17/2020   INR 1.1 03/09/2020    INR 1.1 12/23/2018    Radiology PERIPHERAL VASCULAR CATHETERIZATION  Result Date: 08/16/2020 See op note  VAS [place] UPPER EXTREMITY ARTERIAL DUPLEX  Result Date: 07/24/2020 UPPER EXTREMITY DUPLEX STUDY Indications: Patient complains of post intervention. History:     Patient has a history ofupper extremity pain.  Other Factors: 05/28/2020 rt thrombectomy and stent placed in brachial Comparison Study: 05/22/2020 Performing Technologist: Concha Norway RVT  Examination Guidelines: A complete evaluation includes B-mode imaging, spectral Doppler, color Doppler, and power Doppler as needed of all accessible portions of each vessel. Bilateral testing is considered an integral part of a complete examination.  Limited examinations for reoccurring indications may be performed as noted.  Right Doppler Findings: +---------------+----------+----------+--------+---------+ Site           PSV (cm/s)Waveform  StenosisComments  +---------------+----------+----------+--------+---------+ Subclavian Prox101       triphasic                   +---------------+----------+----------+--------+---------+ Subclavian Dist          monophasic                  +---------------+----------+----------+--------+---------+ Axillary       76        monophasic                  +---------------+----------+----------+--------+---------+ Brachial Prox                      occludednew stent +---------------+----------+----------+--------+---------+ Brachial Mid                       occludednew stent +---------------+----------+----------+--------+---------+ Brachial Dist                      occludednew stent +---------------+----------+----------+--------+---------+ Radial Prox                        occludednew stent +---------------+----------+----------+--------+---------+ Radial Dist    27        monophasic                  +---------------+----------+----------+--------+---------+ Ulnar Dist      17        monophasic                  +---------------+----------+----------+--------+---------+    Summary:  Right: Occluded new proximal brachial artery to radial artery stent        s/p thrombectomy and stent placement. Minimal monophasic flow        in the distal radial artery fed by collateral and native        ulnar arteries. *See table(s) above for measurements and observations. Electronically signed by [name] MD on 07/24/2020 at 9:47:52 AM.    Final      Assessment/Plan 1.  Ischemic right hand status post recent intervention: I have personally reviewed the angiogram and the results from yesterday and spite of a solid reconstruction with good final imaging she is thrombosed within 24 hours.  At this point she has had numerous interventions in an attempt to save her hand.  At this point I do not believe intervention will be a durable reconstruction.  She appears to have a radial artery proximally as a target.  Therefore I feel a bypass would be the more appropriate option.  I discussed this with the patient I also feel that the only other option would be to proceed directly with amputation of her hand.  She refuses hand amputation stating she will never let anybody do that and wishes that everyone think should be done the could be done to save her hand.  2.  Hypertension: In the emergency room she has blood pressure systolic in the 161W to 960A.  I believe this is secondary to pain.  We will monitor her blood pressure postoperatively she will be in the intensive care unit and will treat accordingly.  3.  Hyperlipidemia: Continue statin as ordered and reviewed, no changes at this time    Hortencia Pilar, MD  08/17/2020 6:21 PM

## 2020-08-17 NOTE — Anesthesia Procedure Notes (Addendum)
Arterial Line Insertion Start/End4/29/2022 7:56 PM, 08/17/2020 7:56 PM Performed by: Andria Frames, MD, Rolla Plate, CRNA, anesthesiologist  Patient location: Pre-op. Preanesthetic checklist: patient identified, IV checked, site marked, risks and benefits discussed, surgical consent, monitors and equipment checked, pre-op evaluation, timeout performed and anesthesia consent Left, ulnar was placed Catheter size: 20 Fr Hand hygiene performed , maximum sterile barriers used  and Seldinger technique used Allen's test indicative of satisfactory collateral circulation Attempts: 2 Procedure performed using ultrasound guided technique. Ultrasound Notes:anatomy identified, needle tip was noted to be adjacent to the nerve/plexus identified and no ultrasound evidence of intravascular and/or intraneural injection Following insertion, dressing applied and Biopatch. Post procedure assessment: normal and unchanged  Patient tolerated the procedure well with no immediate complications.

## 2020-08-17 NOTE — Op Note (Signed)
OPERATIVE NOTE   PROCEDURE: 1.  Right axillary to common ulnar bypass using Artegraft. 2.  Right axillary endarterectomy. 3.  Exploration right groin for possible saphenous vein harvest for bypass conduit  PRE-OPERATIVE DIAGNOSIS: Ischemia right hand and upper extremity; thrombosis of intravascular stents; hypercoagulable state  POST-OPERATIVE DIAGNOSIS: Same  SURGEON: Hortencia Pilar  ASSISTANT(S): Jamesetta So, MD  ANESTHESIA: general  ESTIMATED BLOOD LOSS: 100 cc  FINDING(S): Extensive plaque formation throughout.  The radial and ulnar arteries were quite small and severely diseased small amount of thrombus noted in the ulnar.  SPECIMEN(S): Plaque from the axillary artery to pathology for permanent section  INDICATIONS:   Suzanne [name] is a 45 y.o. female who presents with ischemia of her right hand.  She is status post intervention for ischemic symptoms just yesterday and has already rethrombosed.  After review the images it appeared that her ulnar artery was a reasonable target and that a bypass may proved to be more durable.  The risks and benefits for bypass surgery were reviewed all questions were answered alternative therapies were also discussed.  Amputation in particular the patient adamantly stated that she would not let anybody amputate her hand and she wanted everything done.  Therefore we elected to move forward with bypass surgery  DESCRIPTION: After full informed written consent was obtained from the patient, the patient was brought back to the operating room and placed supine upon the operating table.  Prior to induction, the patient received IV antibiotics.   After obtaining adequate anesthesia, the patient was then prepped and draped in the standard fashion for a right arm bypass with prepping of the right leg for vein harvest.  Initially the right groin was explored curvilinear incision was made overlying the bed of the saphenous vein and the saphenous  vein was exposed.  At the level of the saphenofemoral junction it already appeared to be just 2 mm vein and within 10 or so centimeters it had decreased to a size that rendered it useless as a bypass conduit.  The wound was irrigated and then closed in layers with Vicryl skin was closed with staples.  Attention was then turned to the proximal forearm where a linear incision was made beginning at the level that the Viabahn stent could be palpated and extending it distally.  The dissection was carried down to identify the distal brachial artery and then the common ulnar artery was identified.  Meticulous dissection was created around the common ulnar artery and Vesseloops were placed on the major branches.  The radial artery was also identified.  Linear incision was then made in the anterior axillary line and the dissection carried down to expose the axillary artery.  A segment which appeared soft by palpation and had a pulse was then looped proximally and distally with Silastic Vesseloops.  Given that saphenous vein was not available we elected to use Artegraft.  A Kelly-Wick tunneler was then used to create a tunnel from the antecubital fossa to the axillary incision.  Artegraft was prepared on the back table was then marked with a marker.  The Artegraft was then sewn to the Kelly-Wick with a 2-0 silk and pulled through the tunnel.  Working proximally initially the Silastic Vesseloops were used for control arteriotomy was made in the axillary artery with an 11 blade and extended with Potts scissors.  A significant amount of plaque was encountered and using a freer elevator the endarterectomy was performed.  The Artegraft was then beveled to  an appropriate bevel and an end Artegraft to side axillary artery anastomosis was fashioned with running 6-0 Prolene.  Flushing maneuvers were performed and flow was reestablished for what it was.  The Artegraft was flushed with heparinized saline and then clamped at the  level of the antecubital fossa.  Excellent pulse was noted in the graft.  Attention was then turned to the distal anastomosis.  The common ulnar was opened with an 11 blade scalpel and then extended with Potts scissors.  Thrombus was noted at this level and a #2 Fogarty was advanced down the ulnar arm.  The initial pass was quite successful retrieving a small amount of thrombus and reinitiating backbleeding.  However further attempts at passing the Fogarty were unsuccessful and the Fogarty would only advance 2 to 3 cm.  Given this finding I elected to extend the skin incision and further expose the artery.  Several centimeters downstream the artery once again became very small.  However the deep branch appeared more reasonable and the ostia was localized and the arteriotomy was extended.  At this point the radial artery was also dissected circumferentially and evaluated as a possible additional outflow vessel however once this was opened with an 11 blade and extended with a Potts scissor attempts at passing dilators were minimally successful a 1 mm Garrett coronary dilator with advance but a 1-1/2 would would not indicating this would not be sufficient has any additional outflow.  Attention was returned to the common ulnar.  The Artegraft was then beveled to an appropriate shape and an end Artegraft to side, and ulnar anastomosis was fashioned with running 6-0 Prolene.  Flushing maneuvers were performed and suture line was completed.  Bleeding points were controlled with additional 6-0 Prolene.  The Doppler was then delivered onto the field and interrogation demonstrated a biphasic signal with low resistance outflow.  The wounds were then irrigated both proximally distally Exparel with Marcaine was infiltrated into the soft tissues fibril are with Vistaseal was placed around the anastomosis it proximally and distally and subsequently the wounds were closed in layers using 3-0 Vicryl followed by surgical  staples for skin.  Sterile dressings were applied.   The patient tolerated this procedure without hemodynamic instability and will be taken to the intensive care unit.   COMPLICATIONS: None  CONDITION: Skeet Sim[facility]  Office: [phone]   08/17/2020, 10:59 PM

## 2020-08-17 NOTE — ED Notes (Signed)
Paduchowski MD at bedside with doppler to attempt to find pulses in RUE.

## 2020-08-17 NOTE — Anesthesia Procedure Notes (Signed)
Procedure Name: Intubation Performed by: Shaniah Baltes, CRNA Pre-anesthesia Checklist: Patient identified, Patient being monitored, Timeout performed, Emergency Drugs available and Suction available Patient Re-evaluated:Patient Re-evaluated prior to induction Oxygen Delivery Method: Circle system utilized Preoxygenation: Pre-oxygenation with 100% oxygen Induction Type: IV induction and Rapid sequence Laryngoscope Size: 3 and McGraph Grade View: Grade I Tube type: Oral Tube size: 7.0 mm Number of attempts: 1 Airway Equipment and Method: Stylet and Video-laryngoscopy Placement Confirmation: ETT inserted through vocal cords under direct vision,  positive ETCO2 and breath sounds checked- equal and bilateral Secured at: 21 cm Tube secured with: Tape Dental Injury: Teeth and Oropharynx as per pre-operative assessment        

## 2020-08-17 NOTE — ED Provider Notes (Signed)
[facility] Emergency Department Provider Note  Time seen: 4:32 PM  I have reviewed the triage vital signs and the nursing notes.   HISTORY  Chief Complaint Post-op Problem  HPI Suzanne [name] is a 45 y.o. female with a past medical history of aortic thrombus, DVTs, right upper extremity stent placed approximately 1 month ago complicated by thrombectomy/occlusion which was addressed yesterday via angioplasty, presents to the emergency department for acute onset of pain and paleness to the right upper extremity.  According to the patient approximately 2:30 PM she was reaching down to grab a drink when she felt a "pop."  Patient states this was immediately followed by pain and paleness to the right upper extremity.  Patient states severe 10/10 pain mostly in the right hand and forearm.  Patient states she ran out of her blood thinners approximately 2 weeks ago and has not been taking any since.   Past Medical History:  Diagnosis Date  . Aortic thrombus ([place])   . CRPS (complex regional pain syndrome type I) 12/2019  . 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 DVT (deep venous thrombosis) ([place]) 12/21/2018  . 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 Hyperlipidemia 12/18/2017  . Chest pain 01/22/2016  . Tobacco dependence 01/22/2016  . 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 Aortic thrombus ([place]) 01/15/2015    Past Surgical History:  Procedure Laterality Date  . CESAREAN SECTION     times 4  . EMBOLECTOMY Right 03/09/2020   Procedure: Brachial EMBOLECTOMY;  Surgeon: Algernon Huxley, MD;  Location: [facility] ORS;  Service: Vascular;  Laterality: Right;  . UPPER EXTREMITY ANGIOGRAPHY Right 05/28/2020   Procedure: UPPER EXTREMITY ANGIOGRAPHY;  Surgeon: Algernon Huxley, MD;  Location: [place] [facility];  Service: Cardiovascular;  Laterality: Right;  . UPPER EXTREMITY ANGIOGRAPHY Right 08/16/2020   Procedure: UPPER EXTREMITY ANGIOGRAPHY;   Surgeon: Algernon Huxley, MD;  Location: [place] [facility];  Service: Cardiovascular;  Laterality: Right;    Prior to Admission medications   Medication Sig Start Date End Date Taking? Authorizing Provider  albuterol (PROVENTIL HFA;VENTOLIN HFA) 108 (90 Base) MCG/ACT inhaler Inhale 2 puffs into the lungs every 6 (six) hours as needed for wheezing or shortness of breath. Patient not taking: No sig reported 11/21/16   Merlyn Lot, MD  apixaban (ELIQUIS) 5 MG TABS tablet Take 1 tablet (5 mg total) by mouth 2 (two) times daily. 08/16/20   Algernon Huxley, MD  aspirin EC 81 MG tablet Take 1 tablet (81 mg total) by mouth daily. 05/28/20   Algernon Huxley, MD  HYDROcodone-acetaminophen (NORCO/VICODIN) 5-325 MG tablet Take 1 tablet by mouth every 6 (six) hours as needed for moderate pain. 08/16/20   Algernon Huxley, MD    No Known Allergies  Family History  Adopted: Yes  Problem Relation Age of Onset  . Hypertension Mother     Social History Social History   Tobacco Use  . Smoking status: Former Smoker    Packs/day: 0.25    Years: 20.00    Pack years: 5.00    Types: Cigarettes    Quit date: 07/15/2020    Years since quitting: 0.0  . Smokeless tobacco: Never Used  Vaping Use  . Vaping Use: Never used  Substance Use Topics  . Alcohol use: Not Currently    Comment: occ  . Drug use: No    Review of Systems Constitutional: Negative for fever. Cardiovascular:  Negative for chest pain. Respiratory: Negative for shortness of breath. Gastrointestinal: Negative for abdominal pain Musculoskeletal: Right upper extremity pain Neurological: Negative for headache All other ROS negative  ____________________________________________   PHYSICAL EXAM:  Constitutional: Alert and oriented.  Moderate distress due to pain holding her right arm. Eyes: Normal exam ENT      Head: Normocephalic and atraumatic.      Mouth/Throat: Mucous membranes are moist. Cardiovascular: Normal rate, regular rhythm  around 100 bpm.  Respiratory: Normal respiratory effort without tachypnea nor retractions. Breath sounds are clear  Gastrointestinal: Soft and nontender. No distention.   Musculoskeletal: Paleness to the distal right upper extremity unable to palpate radial or ulnar pulses.  Unable to Doppler radial or ulnar pulses. Neurologic:  Normal speech and language. No gross focal neurologic deficits Skin:  Skin is warm, dry overall, pale and cool to the right hand.  Necrotic area to the distal right second digit which appears longstanding. Psychiatric: Mood and affect are normal.   ____________________________________________   INITIAL IMPRESSION / ASSESSMENT AND PLAN / ED COURSE  Pertinent labs & imaging results that were available during my care of the patient were reviewed by me and considered in my medical decision making (see chart for details).   Patient presents emergency department 1 day status post angioplasty of her right upper extremity.  Unable to palpate a radial or ulnar pulse.  Unable to Doppler radial or ulnar pulse.  I spoke to Dr. Nino Parsley nurse as Dr. Delana Meyer is in the OR.  He will be down to see the patient shortly.  Concern for arterial occlusion.  We will start on heparin infusion we will check labs, COVID swab.  Patient will likely require urgent angioplasty.  Dr. Delana Meyer has seen the patient in the emergency department and will be taken to the OR soon as the OR is available for a possible bypass procedure.  We will continue to control pain, heparin ordered for the patient.  Suzanne [name] was evaluated in Emergency Department on 08/17/2020 for the symptoms described in the history of present illness. She was evaluated in the context of the global COVID-19 pandemic, which necessitated consideration that the patient might be at risk for infection with the SARS-CoV-2 virus that causes COVID-19. Institutional protocols and algorithms that pertain to the evaluation of patients at risk  for COVID-19 are in a state of rapid change based on information released by regulatory bodies including the CDC and federal and state organizations. These policies and algorithms were followed during the patient's care in the ED.  CRITICAL CARE Performed by: Harvest Dark   Total critical care time: 30 minutes  Critical care time was exclusive of separately billable procedures and treating other patients.  Critical care was necessary to treat or prevent imminent or life-threatening deterioration.  Critical care was time spent personally by me on the following activities: development of treatment plan with patient and/or surrogate as well as nursing, discussions with consultants, evaluation of patient's response to treatment, examination of patient, obtaining history from patient or surrogate, ordering and performing treatments and interventions, ordering and review of laboratory studies, ordering and review of radiographic studies, pulse oximetry and re-evaluation of patient's condition.  ____________________________________________   FINAL CLINICAL IMPRESSION(S) / ED DIAGNOSES  Arterial occlusion   Harvest Dark, MD 08/17/20 1752

## 2020-08-17 NOTE — ED Triage Notes (Addendum)
Pt to ER with complains of "popping" sensation to right arm. States she had a stent placed yesterday in her right arm for a clot, then felt the pop today. No pulses to right arm in areas marked by staff present with doppler. Pt dry heaving in triage. Reports no sensation but loudly screaming in pain.

## 2020-08-17 NOTE — Anesthesia Preprocedure Evaluation (Addendum)
Anesthesia Evaluation  Patient identified by MRN, date of birth, ID band Patient awake    Reviewed: Allergy & Precautions, H&P , NPO status , Patient's Chart, lab work & pertinent test results  History of Anesthesia Complications Negative for: history of anesthetic complications  Airway Mallampati: III  TM Distance: >3 FB Neck ROM: full    Dental  (+) Chipped, Poor Dentition, Missing, Upper Dentures, Partial Lower, Loose   Pulmonary COPD, former smoker,     + wheezing      Cardiovascular Exercise Tolerance: Good (-) angina+ Past MI and + Peripheral Vascular Disease  Normal cardiovascular exam     Neuro/Psych  Neuromuscular disease negative psych ROS   GI/Hepatic negative GI ROS, Neg liver ROS, neg GERD  ,  Endo/Other  negative endocrine ROS  Renal/GU      Musculoskeletal   Abdominal   Peds  Hematology negative hematology ROS (+)   Anesthesia Other Findings Cold right arm  Past Medical History: No date: Aortic thrombus ([place]) 12/2019: CRPS (complex regional pain syndrome type I) 12/2014: Splenic infarct  Past Surgical History: No date: CESAREAN SECTION     Comment:  times 4 03/09/2020: EMBOLECTOMY; Right     Comment:  Procedure: Brachial EMBOLECTOMY;  Surgeon: Algernon Huxley,              MD;  Location: [facility] ORS;  Service: Vascular;  Laterality:              Right; 05/28/2020: UPPER EXTREMITY ANGIOGRAPHY; Right     Comment:  Procedure: UPPER EXTREMITY ANGIOGRAPHY;  Surgeon: Algernon Huxley, MD;  Location: [place] [facility];  Service:               Cardiovascular;  Laterality: Right; 08/16/2020: UPPER EXTREMITY ANGIOGRAPHY; Right     Comment:  Procedure: UPPER EXTREMITY ANGIOGRAPHY;  Surgeon: Algernon Huxley, MD;  Location: [place] [facility];  Service:               Cardiovascular;  Laterality: Right;  BMI    Body Mass Index: 19.89 kg/m      Reproductive/Obstetrics negative OB  ROS                           Anesthesia Physical Anesthesia Plan  ASA: IV and emergent  Anesthesia Plan: General ETT   Post-op Pain Management:    Induction: Intravenous  PONV Risk Score and Plan: Ondansetron, Dexamethasone, Midazolam and Treatment may vary due to age or medical condition  Airway Management Planned: Oral ETT  Additional Equipment: Arterial line and CVP  Intra-op Plan:   Post-operative Plan: Extubation in OR and Possible Post-op intubation/ventilation  Informed Consent: I have reviewed the patients History and Physical, chart, labs and discussed the procedure including the risks, benefits and alternatives for the proposed anesthesia with the patient or authorized representative who has indicated his/her understanding and acceptance.     Dental Advisory Given  Plan Discussed with: Anesthesiologist, CRNA and Surgeon  Anesthesia Plan Comments: (Patient refusing to remove dentures, plan to remove prior to intubation  Patient consented for risks of anesthesia including but not limited to:  - adverse reactions to medications - damage to eyes, teeth, lips or other oral mucosa - nerve damage due to positioning  - sore throat  or hoarseness - Damage to heart, brain, nerves, lungs, other parts of body or loss of life  Patient voiced understanding.)      Anesthesia Quick Evaluation

## 2020-08-17 NOTE — ED Notes (Signed)
Report given to OR at this time.

## 2020-08-17 NOTE — ED Notes (Signed)
Pt transported to OR at this time by OR staff.

## 2020-08-18 ENCOUNTER — Encounter: Payer: Self-pay | Admitting: Vascular Surgery

## 2020-08-18 ENCOUNTER — Inpatient Hospital Stay
Admit: 2020-08-18 | Discharge: 2020-08-18 | Disposition: A | Discharge status: Home / Self Care | Payer: Medicaid Other | Attending: Vascular Surgery | Admitting: Vascular Surgery

## 2020-08-18 LAB — BPAM RBC
Blood Product Expiration Date: 202205222359
Blood Product Expiration Date: 202205232359
Unit Type and Rh: 1700
Unit Type and Rh: 1700

## 2020-08-18 LAB — CBC
HCT: 30.5 % — ABNORMAL LOW (ref 36.0–46.0)
Hemoglobin: 9.8 g/dL — ABNORMAL LOW (ref 12.0–15.0)
MCH: 25.1 pg — ABNORMAL LOW (ref 26.0–34.0)
MCHC: 32.1 g/dL (ref 30.0–36.0)
MCV: 78.2 fL — ABNORMAL LOW (ref 80.0–100.0)
Platelets: 391 10*3/uL (ref 150–400)
RBC: 3.9 MIL/uL (ref 3.87–5.11)
RDW: 18.6 % — ABNORMAL HIGH (ref 11.5–15.5)
WBC: 17.8 10*3/uL — ABNORMAL HIGH (ref 4.0–10.5)
nRBC: 0 % (ref 0.0–0.2)

## 2020-08-18 LAB — TYPE AND SCREEN
ABO/RH(D): B NEG
Antibody Screen: NEGATIVE
Unit division: 0
Unit division: 0

## 2020-08-18 LAB — GLUCOSE, CAPILLARY: Glucose-Capillary: 173 mg/dL — ABNORMAL HIGH (ref 70–99)

## 2020-08-18 LAB — BASIC METABOLIC PANEL
Anion gap: 8 (ref 5–15)
BUN: 8 mg/dL (ref 6–20)
CO2: 20 mmol/L — ABNORMAL LOW (ref 22–32)
Calcium: 8.4 mg/dL — ABNORMAL LOW (ref 8.9–10.3)
Chloride: 105 mmol/L (ref 98–111)
Creatinine, Ser: 0.94 mg/dL (ref 0.44–1.00)
GFR, Estimated: >60 mL/min (ref >60)
Glucose, Bld: 176 mg/dL — ABNORMAL HIGH (ref 70–99)
Potassium: 3.8 mmol/L (ref 3.5–5.1)
Sodium: 133 mmol/L — ABNORMAL LOW (ref 135–145)

## 2020-08-18 LAB — MAGNESIUM: Magnesium: 1.7 mg/dL (ref 1.7–2.4)

## 2020-08-18 LAB — PREPARE RBC (CROSSMATCH)

## 2020-08-18 LAB — PHOSPHORUS: Phosphorus: 2.6 mg/dL (ref 2.5–4.6)

## 2020-08-18 MED ORDER — SODIUM CHLORIDE 0.9 % IV SOLN: INTRAVENOUS | Status: DC

## 2020-08-18 MED ORDER — CEFAZOLIN SODIUM-DEXTROSE 2-4 GM/100ML-% IV SOLN
2.0000 g | Freq: Three times a day (TID) | INTRAVENOUS | Status: AC
  Administered 2020-08-18: 2 g via INTRAVENOUS
  Filled 2020-08-18 (×2): qty 100

## 2020-08-18 MED ORDER — POTASSIUM CHLORIDE CRYS ER 20 MEQ PO TBCR: 20.0000 meq | EXTENDED_RELEASE_TABLET | Freq: Every day | ORAL | Status: DC | PRN

## 2020-08-18 MED ORDER — HYDRALAZINE HCL 20 MG/ML IJ SOLN: 10.0000 mg | Freq: Once | INTRAMUSCULAR | Status: AC

## 2020-08-18 MED ORDER — ACETAMINOPHEN 650 MG RE SUPP
325.0000 mg | RECTAL | Status: DC | PRN
Start: 2020-08-17 — End: 2020-08-20

## 2020-08-18 MED ORDER — METOPROLOL TARTRATE 5 MG/5ML IV SOLN: 2.0000 mg | INTRAVENOUS | Status: DC | PRN

## 2020-08-18 MED ORDER — ALUM & MAG HYDROXIDE-SIMETH 200-200-20 MG/5ML PO SUSP: 15.0000 mL | ORAL | Status: DC | PRN

## 2020-08-18 MED ORDER — DOPAMINE-DEXTROSE 3.2-5 MG/ML-% IV SOLN
3.0000 ug/kg/min | INTRAVENOUS | Status: DC
  Filled 2020-08-18: qty 250

## 2020-08-18 MED ORDER — ONDANSETRON HCL 4 MG/2ML IJ SOLN: 4.0000 mg | Freq: Four times a day (QID) | INTRAMUSCULAR | Status: DC | PRN

## 2020-08-18 MED ORDER — MAGNESIUM SULFATE 2 GM/50ML IV SOLN: 2.0000 g | Freq: Every day | INTRAVENOUS | Status: DC | PRN

## 2020-08-18 MED ORDER — APIXABAN 5 MG PO TABS
5.0000 mg | ORAL_TABLET | Freq: Two times a day (BID) | ORAL | Status: DC
  Administered 2020-08-18 – 2020-08-20 (×4): 5 mg via ORAL
  Filled 2020-08-18 (×4): qty 1

## 2020-08-18 MED ORDER — FAMOTIDINE IN NACL 20-0.9 MG/50ML-% IV SOLN
20.0000 mg | Freq: Two times a day (BID) | INTRAVENOUS | Status: DC
  Administered 2020-08-18 – 2020-08-19 (×5): 20 mg via INTRAVENOUS
  Filled 2020-08-18 (×5): qty 50

## 2020-08-18 MED ORDER — GUAIFENESIN-DM 100-10 MG/5ML PO SYRP: 15.0000 mL | ORAL_SOLUTION | ORAL | Status: DC | PRN

## 2020-08-18 MED ORDER — HYDROMORPHONE HCL 1 MG/ML IJ SOLN
0.5000 mg | INTRAMUSCULAR | Status: DC | PRN
  Administered 2020-08-18 – 2020-08-19 (×13): 1 mg via INTRAVENOUS
  Filled 2020-08-18 (×13): qty 1

## 2020-08-18 MED ORDER — ALBUTEROL SULFATE (2.5 MG/3ML) 0.083% IN NEBU: 2.5000 mg | INHALATION_SOLUTION | Freq: Four times a day (QID) | RESPIRATORY_TRACT | Status: DC | PRN

## 2020-08-18 MED ORDER — OXYCODONE-ACETAMINOPHEN 5-325 MG PO TABS
1.0000 | ORAL_TABLET | ORAL | Status: DC | PRN
  Administered 2020-08-18 (×2): 2 via ORAL
  Administered 2020-08-18 (×2): 1 via ORAL
  Administered 2020-08-19 – 2020-08-20 (×5): 2 via ORAL
  Filled 2020-08-18 (×5): qty 2
  Filled 2020-08-18: qty 1
  Filled 2020-08-18 (×2): qty 2
  Filled 2020-08-18: qty 1

## 2020-08-18 MED ORDER — DOCUSATE SODIUM 100 MG PO CAPS
100.0000 mg | ORAL_CAPSULE | Freq: Every day | ORAL | Status: DC
  Administered 2020-08-18 – 2020-08-20 (×3): 100 mg via ORAL
  Filled 2020-08-18 (×3): qty 1

## 2020-08-18 MED ORDER — HYDRALAZINE HCL 20 MG/ML IJ SOLN
5.0000 mg | INTRAMUSCULAR | Status: DC | PRN
  Administered 2020-08-18: 5 mg via INTRAVENOUS
  Filled 2020-08-18: qty 1

## 2020-08-18 MED ORDER — ENOXAPARIN SODIUM 30 MG/0.3ML IJ SOSY: 30.0000 mg | PREFILLED_SYRINGE | INTRAMUSCULAR | Status: DC

## 2020-08-18 MED ORDER — NITROGLYCERIN IN D5W 200-5 MCG/ML-% IV SOLN: 5.0000 ug/min | INTRAVENOUS | Status: DC

## 2020-08-18 MED ORDER — SENNOSIDES-DOCUSATE SODIUM 8.6-50 MG PO TABS: 1.0000 | ORAL_TABLET | Freq: Every evening | ORAL | Status: DC | PRN

## 2020-08-18 MED ORDER — CHLORHEXIDINE GLUCONATE CLOTH 2 % EX PADS: 6.0000 | MEDICATED_PAD | Freq: Every day | CUTANEOUS | Status: DC

## 2020-08-18 MED ORDER — ASPIRIN EC 81 MG PO TBEC
81.0000 mg | DELAYED_RELEASE_TABLET | Freq: Every day | ORAL | Status: DC
  Administered 2020-08-18 – 2020-08-20 (×3): 81 mg via ORAL
  Filled 2020-08-18 (×3): qty 1

## 2020-08-18 MED ORDER — SODIUM CHLORIDE 0.9 % IV SOLN: 500.0000 mL | Freq: Once | INTRAVENOUS | Status: DC | PRN

## 2020-08-18 MED ORDER — HYDRALAZINE HCL 20 MG/ML IJ SOLN
INTRAMUSCULAR | Status: AC
  Administered 2020-08-18: 10 mg via INTRAVENOUS
  Filled 2020-08-18: qty 1

## 2020-08-18 MED ORDER — PHENOL 1.4 % MT LIQD
1.0000 | OROMUCOSAL | Status: DC | PRN
  Filled 2020-08-18: qty 177

## 2020-08-18 MED ORDER — ATORVASTATIN CALCIUM 20 MG PO TABS
20.0000 mg | ORAL_TABLET | Freq: Every day | ORAL | Status: DC
  Administered 2020-08-18 – 2020-08-20 (×3): 20 mg via ORAL
  Filled 2020-08-18 (×4): qty 1

## 2020-08-18 MED ORDER — SORBITOL 70 % SOLN
30.0000 mL | Freq: Every day | Status: DC | PRN
  Filled 2020-08-18: qty 30

## 2020-08-18 MED ORDER — ENOXAPARIN SODIUM 40 MG/0.4ML IJ SOSY: 40.0000 mg | PREFILLED_SYRINGE | INTRAMUSCULAR | Status: DC

## 2020-08-18 MED ORDER — LABETALOL HCL 5 MG/ML IV SOLN
10.0000 mg | INTRAVENOUS | Status: DC | PRN
  Administered 2020-08-19 – 2020-08-20 (×3): 10 mg via INTRAVENOUS
  Filled 2020-08-18 (×3): qty 4

## 2020-08-18 MED ORDER — NICOTINE 14 MG/24HR TD PT24
14.0000 mg | MEDICATED_PATCH | Freq: Every day | TRANSDERMAL | Status: DC
  Administered 2020-08-18 – 2020-08-20 (×3): 14 mg via TRANSDERMAL
  Filled 2020-08-18 (×3): qty 1

## 2020-08-18 MED ORDER — ACETAMINOPHEN 325 MG PO TABS: 325.0000 mg | ORAL_TABLET | ORAL | Status: DC | PRN

## 2020-08-18 NOTE — Anesthesia Postprocedure Evaluation (Signed)
Anesthesia Post Note  Patient: Suzanne [name]  Procedure(s) Performed: THROMBECTOMY BRACHIAL ARTERY (Right ) ENDARTERECTOMY BRACHIAL (Right ) BYPASS AXILLA/BRACHIAL ARTERY (Right )  Patient location during evaluation: PACU Anesthesia Type: General Level of consciousness: awake and alert Pain management: pain level controlled Vital Signs Assessment: post-procedure vital signs reviewed and stable Respiratory status: spontaneous breathing, nonlabored ventilation, respiratory function stable and patient connected to nasal cannula oxygen Cardiovascular status: blood pressure returned to baseline and stable Postop Assessment: no apparent nausea or vomiting Anesthetic complications: no   No complications documented.   Last Vitals:  Vitals:   08/18/20 0400 08/18/20 0500  BP: (!) 166/76 (!) 155/82  Pulse: 92 83  Resp: 20 14  Temp: 36.7 C   SpO2: 97% 96%    Last Pain:  Vitals:   08/18/20 0400  TempSrc: Oral  PainSc: 8                  Suzanne [name]

## 2020-08-18 NOTE — Progress Notes (Signed)
Neuro: stable at baseline Resp: stable on room air CV: afebrile, a line removed per Dr. Lanney Gins d/t pain, hydral x 1 given for BP support Vasc: R UE- great improvement over the course of the day. Increased movement, color is becoming more pink, temperature is cool to almost warm from cold this AM, all pulses have been palpable throughout most of the day GIGU: tolerating PO well, d/c MIVF per vasc surg Skin: clean dry and intact  Social: Patient is communicating with friends and family via cell phone, encouraged to remain optimistic with her prognosis, all questions and concerns addressed.   Events: ECHO completed today.

## 2020-08-18 NOTE — Progress Notes (Addendum)
1 Day Post-Op   Subjective/Chief Complaint: Doing OK. States pain is moderate- mostly at incision site. Reports hand numbness is significantly improved. Remains hypertensive.   Objective: Vital signs in last 24 hours: Temp:  [97.4 F (36.3 C)-98.4 F (36.9 C)] 97.9 F (36.6 C) (04/30 0800) Pulse Rate:  [70-100] 85 (04/30 1200) Resp:  [13-24] 16 (04/30 1200) BP: (126-207)/(72-144) 155/82 (04/30 0500) SpO2:  [93 %-100 %] 98 % (04/30 1200) Arterial Line BP: (86-155)/(42-96) 155/96 (04/30 1200) Weight:  [57.6 kg] 57.6 kg (04/29 1619) Last BM Date:  (PTA)  Intake/Output from previous day: 04/29 0701 - 04/30 0700 In: 1248.7 [I.V.:998.6; IV Piggyback:250.1] Out: 50 [Blood:50] Intake/Output this shift: Total I/O In: 190.3 [I.V.:190.3] Out: 450 [Urine:450]  General appearance: alert and no distress Cardio: regular rate and rhythm Extremities: RIGHT ARM: warm, pink to fingertips, dry gangrene tip of 5th digit, palpable pulse in graft, +motor inhand, numbness in fingertips, Axillary incision- C/D/I, soft, forearm incision- C/D/I, + ecchymosis  Lab Results:  Recent Labs    08/17/20 1622 08/18/20 0344  WBC 16.4* 17.8*  HGB 10.7* 9.8*  HCT 34.2* 30.5*  PLT 423* 391   BMET Recent Labs    08/17/20 1622 08/18/20 0119  NA 132* 133*  K 2.9* 3.8  CL 103 105  CO2 18* 20*  GLUCOSE 146* 176*  BUN 7 8  CREATININE 0.89 0.94  CALCIUM 8.6* 8.4*   PT/INR Recent Labs    08/17/20 1622  LABPROT 13.7  INR 1.1   ABG No results for input(s): PHART, HCO3 in the last 72 hours.  Invalid input(s): PCO2, PO2  Studies/Results: No results found.  Anti-infectives: Anti-infectives (From admission, onward)   Start     Dose/Rate Route Frequency Ordered Stop   08/18/20 0600  ceFAZolin (ANCEF) IVPB 2g/100 mL premix        2 g 200 mL/hr over 30 Minutes Intravenous On call to O.R. 08/17/20 1819 08/17/20 1935   08/18/20 0200  ceFAZolin (ANCEF) IVPB 2g/100 mL premix        2 g 200 mL/hr  over 30 Minutes Intravenous Every 8 hours 08/18/20 0107 08/18/20 1053   08/17/20 1834  ceFAZolin (ANCEF) 2-4 GM/100ML-% IVPB       Note to Pharmacy: Shary Key   : cabinet override      08/17/20 1834 08/18/20 1053      Assessment/Plan: s/p Procedure(s): THROMBECTOMY BRACHIAL ARTERY (Right) ENDARTERECTOMY BRACHIAL (Right) BYPASS AXILLA/BRACHIAL ARTERY (Right) POD #1 s/p .  Right axillary to common ulnar bypass using Artegraft  Keep in ICU today/tomorrow: BP control; continue to monitor RIGHT arm Will resume Eliquis Will obtain ECHO  LOS: 1 day    Jamesetta So A 08/18/2020

## 2020-08-18 NOTE — Consult Note (Signed)
NAME:  Suzanne [name], MRN:  [id number], DOB:  01-Feb-1976, LOS: 1 ADMISSION DATE:  08/17/2020, CONSULTATION DATE: 08/18/2020 REFERRING MD: Dr. Lorenso Courier, CHIEF COMPLAINT: Right upper extremity ischemia  History of Present Illness:  45 year old female presented to [facility] ED due to acute onset of pain and paleness in her right upper extremity.  The patient has a history of right upper extremity stent placement approximately a month ago complicated by thrombectomy/occlusion which was addressed on 08/16/2020 via angioplasty.  Per ED documentation the patient reported at approximately 2:30 PM on 08/17/2020 she was reaching down to grab a drink when she felt a " pop".  Patient states this was immediately followed by 10/10 pain and paleness to the right upper extremity.  Patient reported she ran out of her blood thinners approximately 2 weeks ago and has not been taking any since. ED course: Patient was evaluated bedside by Dr. Delana Meyer with vascular surgery with plans to take the patient to the OR as soon as possible for possible bypass procedure.  Heparin was initiated as well as pain control.  Patient also had hypertensive urgency in the ED with SBP in the 180s to 200s.  Patient at that time was rating her pain as " horrific" and not well controlled with any pain medications. Initial vitals: Afebrile at 98.4, tachypneic at 24, NSR at 79,  OR: Vascular surgery completed right axillary to common ulnar bypass using Artegraft, a right axillary enterectomy & exploration of right groin for possible saphenous vein harvest for bypass conduit.  The patient was intubated for the procedure and able to be extubated postprocedure without complication.   PCCM consulted for overnight pain management & monitoring. Pertinent  Medical History  Aortic thrombus Deep vein thrombosis CRPS Splenic infarct  Significant Hospital Events: Including procedures, antibiotic start and stop dates in addition to other pertinent events    . 08/17/2020-patient taken emergently to OR for bypass of right upper extremity . 08/18/2020-patient admitted to ICU for monitoring, hemodynamically stable  Interim History / Subjective:  Patient alert and responsive, complaining of pain/numbness/tingling in right hand.  Right hand and forearm feel cold to touch, slightly dusky, no palpable pulse.  Right axillary graft has dopplerable pulse.  Objective   Blood pressure (!) 141/78, pulse 83, temperature 98.4 F (36.9 C), resp. rate 16, height 5\' 7"  (1.702 m), weight 57.6 kg, SpO2 99 %.        Intake/Output Summary (Last 24 hours) at 08/18/2020 0059 Last data filed at 08/17/2020 2332 Gross per 24 hour  Intake 800 ml  Output 50 ml  Net 750 ml   Filed Weights   08/17/20 1619  Weight: 57.6 kg    Examination: General: Adult female, critically ill, lying in bed, in pain- NAD HEENT: MM pink/moist, anicteric, atraumatic, neck supple Neuro: A&O x 4, able to follow commands, PERRL +3, MAE CV: s1s2 RRR, NSR on monitor, no r/m/g Pulm: Regular, non labored on room air, breath sounds clear-BUL & diminished-BLL GI: soft, rounded, non tender, bs x 4 Skin: Scabbed abrasions and mild duskiness on right hand Extremities: warm/dry, +2 pulses noted in all but right extremity/axillary graft pulse auscultated on Doppler, no edema noted Labs/imaging that I have personally reviewed  (right click and "Reselect all SmartList Selections" daily)  -new labs pending- Na+/ K+: 132/2.9 BUN/Cr.:  7/0.89 Serum CO2/ AG: 18/11  Hgb: 10.7  WBC/ TMAX: 16.4/afebrile Resolved Hospital Problem list     Assessment & Plan:  Right Upper extremity ischemia status post right  axillary to common ulnar bypass using Artegraft PMHx: Aortic thrombus, DVTs, complex regional pain syndrome type I (CRPS) -Continuous cardiac monitoring -Doppler auxiliary graft pulse every 2 -Monitor for s/s of bleeding at site -Continue Dilaudid as needed & Percocet as needed for pain  control -Consider additional pain management/Precedex drip if needed -Continue metoprolol as needed to maintain HR less than 130 -Continue hydralazine & labetalol as needed for blood pressure control, maintain SBP less than 160  Best practice (right click and "Reselect all SmartList Selections" daily)  Diet:  NPO Pain/Anxiety/Delirium protocol (if indicated): No VAP protocol (if indicated): Not indicated DVT prophylaxis: LMWH GI prophylaxis: H2B Glucose control:  SSI No Central venous access:  N/A Arterial line:  Yes, and it is still needed Foley:  N/A Mobility:  OOB  PT consulted: N/A Last date of multidisciplinary goals of care discussion 08/17/20 Code Status:  full code Disposition: ICU  Labs   CBC: Recent Labs  Lab 08/17/20 1622  WBC 16.4*  HGB 10.7*  HCT 34.2*  MCV 79.5*  PLT 423*    Basic Metabolic Panel: Recent Labs  Lab 08/16/20 0924 08/17/20 1622  NA  --  132*  K  --  2.9*  CL  --  103  CO2  --  18*  GLUCOSE  --  146*  BUN 7 7  CREATININE 0.70 0.89  CALCIUM  --  8.6*   GFR: Estimated Creatinine Clearance: 74.1 mL/min (by C-G formula based on SCr of 0.89 mg/dL). Recent Labs  Lab 08/17/20 1622  WBC 16.4*    Liver Function Tests: Recent Labs  Lab 08/17/20 1622  AST 26  ALT 9  ALKPHOS 66  BILITOT 0.8  PROT 7.5  ALBUMIN 3.7   No results for input(s): LIPASE, AMYLASE in the last 168 hours. No results for input(s): AMMONIA in the last 168 hours.  ABG No results found for: PHART, PCO2ART, PO2ART, HCO3, TCO2, ACIDBASEDEF, O2SAT   Coagulation Profile: Recent Labs  Lab 08/17/20 1622  INR 1.1    Cardiac Enzymes: No results for input(s): CKTOTAL, CKMB, CKMBINDEX, TROPONINI in the last 168 hours.  HbA1C: No results found for: HGBA1C  CBG: Recent Labs  Lab 08/18/20 0050  GLUCAP 173*    Review of Systems: Positives in bold  Gen: Denies fever, chills, weight change, fatigue, night sweats HEENT: Denies blurred vision, double vision,  hearing loss, tinnitus, sinus congestion, rhinorrhea, sore throat, neck stiffness, dysphagia PULM: Denies shortness of breath, cough, sputum production, hemoptysis, wheezing CV: Denies chest pain, edema, orthopnea, paroxysmal nocturnal dyspnea, palpitations GI: Denies abdominal pain, nausea, vomiting, diarrhea, hematochezia, melena, constipation, change in bowel habits GU: Denies dysuria, hematuria, polyuria, oliguria, urethral discharge Endocrine: Denies hot or cold intolerance, polyuria, polyphagia or appetite change Derm: Denies rash, dry skin, scaling or peeling skin change Heme: Denies easy bruising, bleeding, bleeding gums, pain/numbness/tingling/paleness in right upper extremity Neuro: Denies headache, numbness, weakness, slurred speech, loss of memory or consciousness  Past Medical History:  She,  has a past medical history of Aortic thrombus ([place]), CRPS (complex regional pain syndrome type I) (12/2019), and 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   Surgical History:   Past Surgical History:  Procedure Laterality Date  . CESAREAN SECTION     times 4  . EMBOLECTOMY Right 03/09/2020   Procedure: Brachial EMBOLECTOMY;  Surgeon: Algernon Huxley, MD;  Location: [facility] ORS;  Service: Vascular;  Laterality: Right;  . UPPER EXTREMITY ANGIOGRAPHY Right 05/28/2020   Procedure: UPPER EXTREMITY ANGIOGRAPHY;  Surgeon: Algernon Huxley, MD;  Location: [place] [facility];  Service: Cardiovascular;  Laterality: Right;  . UPPER EXTREMITY ANGIOGRAPHY Right 08/16/2020   Procedure: UPPER EXTREMITY ANGIOGRAPHY;  Surgeon: Algernon Huxley, MD;  Location: [place] [facility];  Service: Cardiovascular;  Laterality: Right;     Social History:   reports that she quit smoking about 4 weeks ago. Her smoking use included cigarettes. She has a 5.00 pack-year smoking history. She has never used smokeless tobacco. She reports previous alcohol use. She reports that she does not use drugs.   Family History:  Her family history includes  Hypertension in her mother. She was adopted.   Allergies No Known Allergies   Home Medications  Prior to Admission medications   Medication Sig Start Date End Date Taking? Authorizing Provider  albuterol (PROVENTIL HFA;VENTOLIN HFA) 108 (90 Base) MCG/ACT inhaler Inhale 2 puffs into the lungs every 6 (six) hours as needed for wheezing or shortness of breath. Patient not taking: No sig reported 11/21/16   Merlyn Lot, MD  apixaban (ELIQUIS) 5 MG TABS tablet Take 1 tablet (5 mg total) by mouth 2 (two) times daily. 08/16/20   Algernon Huxley, MD  aspirin EC 81 MG tablet Take 1 tablet (81 mg total) by mouth daily. 05/28/20   Algernon Huxley, MD  HYDROcodone-acetaminophen (NORCO/VICODIN) 5-325 MG tablet Take 1 tablet by mouth every 6 (six) hours as needed for moderate pain. 08/16/20   Algernon Huxley, MD     Critical care time: 35 minutes      Venetia Night, AGACNP-BC Acute Care Nurse Practitioner [facility]   [phone] / [phone] Please see Amion for pager details.

## 2020-08-18 NOTE — Progress Notes (Signed)
PT Cancellation Note  Patient Details Name: Suzanne [name] MRN: [id number] DOB: [birth year]   Cancelled Treatment:    Reason Eval/Treat Not Completed: Other (comment). Upon arrival to room, pt needing to use BSC. Pt independent with bed mobility and transferring to [facility]. Per discussion with OT, pt able to ambulate without assist. No formal need for PT at this time. Screen performed and patient agreed. Will dc in house and complete current order.   Mardie Kellen 08/18/2020, 1:47 PM  Greggory Stallion, PT, DPT [phone]

## 2020-08-18 NOTE — Evaluation (Signed)
Occupational Therapy Evaluation Patient Details Name: Suzanne [name] MRN: [id number] DOB: 1975/12/19 Today's Date: 08/18/2020    History of Present Illness pt is a 45 y/o F with PMH: aortic thrombus, DVTs, R UE stent approximately 1MA (complicated by occlusion), HLD, and tobacco dependence. Pt was s/p angiogram and stent placed 4/28 and pt d/c'ed home. Pt re-presented on 4/29 d/t experiencing a "pop" sensation while reaching for something and then noting that her arm was looking pale with decreased sensation and pain. Pt was taken emergently to OR for bypass of right upper extremity and is in ICU for monitoring.   Clinical Impression   Pt seen for OT evaluation this date in setting of acute hospitalization d/t R UE thrombus requiring bypass. Pt reports being INDEP at baseline including with IADLs. Pt presents this date primarily limited by R UE pain and decreased [place] post-op'ly. Pt requires SETUP to MIN A with some ADLs that require recruitment of [facility] skills such as manipulating coins, writing and opening condiments/containers. OT educates pt re: role and engages pt in below-listed fine motor coordination exercises. Will continue to follow acutely and recommend that pt f/u with OPOT services to improve coordination skills for meaningful occupations.     Follow Up Recommendations  Outpatient OT    Equipment Recommendations  None recommended by OT    Recommendations for Other Services       Precautions / Restrictions Precautions Precautions: Fall Restrictions Weight Bearing Restrictions: No      Mobility Bed Mobility Overal bed mobility: Modified Independent                  Transfers Overall transfer level: Modified independent                    Balance Overall balance assessment: Modified Independent                                         ADL either performed or assessed with clinical judgement   ADL                                          General ADL Comments: requires MIN A for [facility] tasks with R UE such as manipulating coins, opening containers/condiments, writing. Otherwise MOD I to INDEP with ADLs.     Vision Patient Visual Report: No change from baseline       Perception     Praxis      Pertinent Vitals/Pain Pain Assessment: 0-10 Pain Score: 7  Pain Location: R UE Pain Descriptors / Indicators: Tender;Sore Pain Intervention(s): Limited activity within patient's tolerance;Monitored during session;Repositioned     Hand Dominance Right   Extremity/Trunk Assessment Upper Extremity Assessment Upper Extremity Assessment: RUE deficits/detail;LUE deficits/detail RUE Deficits / Details: ROM of shld, elbow and wrist WFL, some limited dexterity of digits, esp index finger. Pt with blackened skin noted to R 5th digit, states she has normal sensation. Grip MMT grossly 3-/5 RUE: Unable to fully assess due to pain RUE Coordination: decreased fine motor;decreased gross motor LUE Deficits / Details: WFL   Lower Extremity Assessment Lower Extremity Assessment: Overall WFL for tasks assessed       Communication Communication Communication: No difficulties   Cognition Arousal/Alertness: Awake/alert Behavior During Therapy: WFL for tasks assessed/performed Overall  Cognitive Status: Within Functional Limits for tasks assessed                                     General Comments       Exercises Other Exercises Other Exercises: OT educates pt re: role as well as [place] exercise including abd/add of digits, flex/ext of digits, and opposition and engages pt in each for 5 reps per digit.   Shoulder Instructions      Home Living Family/patient expects to be discharged to:: Private residence Living Arrangements: Spouse/significant other;Children Available Help at Discharge: Family;Available PRN/intermittently Type of Home: House                       Home Equipment: None           Prior Functioning/Environment Level of Independence: Independent        Comments: INDEP with all ADLs/IADLs        OT Problem List: Decreased range of motion;Decreased coordination      OT Treatment/Interventions: Self-care/ADL training;Therapeutic activities;Therapeutic exercise    OT Goals(Current goals can be found in the care plan section) Acute Rehab OT Goals Patient Stated Goal: to get this arm working better and go home OT Goal Formulation: With patient/family Time For Goal Achievement: 09/01/20 Potential to Achieve Goals: Good  OT Frequency: Min 1X/week   Barriers to D/C:            Co-evaluation              AM-PAC OT "6 Clicks" Daily Activity     Outcome Measure Help from another person eating meals?: A Little (SETUP for container/condiments) Help from another person taking care of personal grooming?: None Help from another person toileting, which includes using toliet, bedpan, or urinal?: A Little Help from another person bathing (including washing, rinsing, drying)?: A Little Help from another person to put on and taking off regular upper body clothing?: None Help from another person to put on and taking off regular lower body clothing?: A Little 6 Click Score: 20   End of Session Nurse Communication: Mobility status  Activity Tolerance: Patient tolerated treatment well Patient left: in bed;with call bell/phone within reach;with family/visitor present  OT Visit Diagnosis: Other (comment) (R27.8 Other Lack of Coordination)                Time: 2536-6440 OT Time Calculation (min): 28 min Charges:  OT General Charges $OT Visit: 1 Visit OT Evaluation $OT Eval Moderate Complexity: 1 Mod OT Treatments $Self Care/Home Management : 8-22 mins $Therapeutic Exercise: 8-22 mins  Gerrianne Scale, MS, OTR/L ascom [phone] 08/18/20, 5:46 PM

## 2020-08-19 LAB — BASIC METABOLIC PANEL
Anion gap: 7 (ref 5–15)
BUN: 7 mg/dL (ref 6–20)
CO2: 23 mmol/L (ref 22–32)
Calcium: 8.1 mg/dL — ABNORMAL LOW (ref 8.9–10.3)
Chloride: 107 mmol/L (ref 98–111)
Creatinine, Ser: 0.68 mg/dL (ref 0.44–1.00)
GFR, Estimated: >60 mL/min (ref >60)
Glucose, Bld: 100 mg/dL — ABNORMAL HIGH (ref 70–99)
Potassium: 3.4 mmol/L — ABNORMAL LOW (ref 3.5–5.1)
Sodium: 137 mmol/L (ref 135–145)

## 2020-08-19 LAB — ECHOCARDIOGRAM COMPLETE
AR max vel: 3 cm2
AV Area VTI: 3.62 cm2
AV Area mean vel: 3.17 cm2
AV Mean grad: 3 mmHg
AV Peak grad: 7 mmHg
Ao pk vel: 1.32 m/s
Area-P 1/2: 3.74 cm2
Height: 67 in
S' Lateral: 2.55 cm
Weight: 2032.01 oz

## 2020-08-19 LAB — CBC
HCT: 26.8 % — ABNORMAL LOW (ref 36.0–46.0)
Hemoglobin: 8.3 g/dL — ABNORMAL LOW (ref 12.0–15.0)
MCH: 25.2 pg — ABNORMAL LOW (ref 26.0–34.0)
MCHC: 31 g/dL (ref 30.0–36.0)
MCV: 81.2 fL (ref 80.0–100.0)
Platelets: 419 10*3/uL — ABNORMAL HIGH (ref 150–400)
RBC: 3.3 MIL/uL — ABNORMAL LOW (ref 3.87–5.11)
RDW: 18.6 % — ABNORMAL HIGH (ref 11.5–15.5)
WBC: 17 10*3/uL — ABNORMAL HIGH (ref 4.0–10.5)
nRBC: 0 % (ref 0.0–0.2)

## 2020-08-19 LAB — MAGNESIUM: Magnesium: 1.9 mg/dL (ref 1.7–2.4)

## 2020-08-19 LAB — PHOSPHORUS: Phosphorus: 3.1 mg/dL (ref 2.5–4.6)

## 2020-08-19 MED ORDER — ALPRAZOLAM 0.5 MG PO TABS
0.5000 mg | ORAL_TABLET | Freq: Every evening | ORAL | Status: DC | PRN
  Administered 2020-08-19 (×2): 0.5 mg via ORAL
  Filled 2020-08-19 (×2): qty 1

## 2020-08-19 MED ORDER — POTASSIUM CHLORIDE 20 MEQ PO PACK
40.0000 meq | PACK | Freq: Once | ORAL | Status: AC
  Administered 2020-08-19: 40 meq via ORAL
  Filled 2020-08-19: qty 2

## 2020-08-19 NOTE — Progress Notes (Signed)
2 Days Post-Op   Subjective/Chief Complaint: Hypertensive overnight- now controlled. Reported improvement in RIGHT arm/hand with mobility, sensation.   Objective: Vital signs in last 24 hours: Temp:  [97.9 F (36.6 C)-98.4 F (36.9 C)] 98 F (36.7 C) (05/01 0800) Pulse Rate:  [61-119] 64 (05/01 1000) Resp:  [13-23] 14 (05/01 1000) BP: (143-173)/(81-100) 143/83 (05/01 1000) SpO2:  [84 %-100 %] 88 % (05/01 1000) Arterial Line BP: (104-238)/(69-214) 238/214 (04/30 1500) Last BM Date:  (PTA)  Intake/Output from previous day: 04/30 0701 - 05/01 0700 In: 788.4 [I.V.:638.4; IV Piggyback:150] Out: 1050 [Urine:1050] Intake/Output this shift: Total I/O In: 50 [IV Piggyback:50] Out: -   General appearance: alert and no distress Cardio: regular rate and rhythm Extremities: RIGHT groin incision- C/D/I RIGHT ARM: warm, pink to fingertips, 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 + ecchymosis/edema over forearm, soft. Lab Results:  Recent Labs    08/18/20 0344 08/19/20 0421  WBC 17.8* 17.0*  HGB 9.8* 8.3*  HCT 30.5* 26.8*  PLT 391 419*   BMET Recent Labs    08/18/20 0119 08/19/20 0421  NA 133* 137  K 3.8 3.4*  CL 105 107  CO2 20* 23  GLUCOSE 176* 100*  BUN 8 7  CREATININE 0.94 0.68  CALCIUM 8.4* 8.1*   PT/INR Recent Labs    08/17/20 1622  LABPROT 13.7  INR 1.1   ABG No results for input(s): PHART, HCO3 in the last 72 hours.  Invalid input(s): PCO2, PO2  Studies/Results: ECHOCARDIOGRAM COMPLETE  Result Date: 08/19/2020    ECHOCARDIOGRAM REPORT   Patient Name:   HAIVEN NARDONE [facility] Date of Exam: 08/18/2020 Medical Rec #:  [id number]         Height:       67.0 in Accession #:    [id number]        Weight:       127.0 lb Date of Birth:  13-Mar-1976          BSA:          1.667 m Patient Age:    45 years          BP:           155/82 mmHg Patient Gender: F                 HR:           85 bpm. Exam Location:  [facility] Procedure: 2D Echo,  Cardiac Doppler and Color Doppler Indications:     ISCHEMIA OF RIGHT UPPER EXTREMITY  History:         Patient has no prior history of Echocardiogram examinations.                  Signs/Symptoms:Chest Pain; Risk Factors:Dyslipidemia and                  Current Smoker. ISCHEMIA OF RIGHT UPPER EXTREMITY.  Sonographer:     Kenton Kingfisher, ANN Referring Phys:  [id number] [place] A Jametta [place] Diagnosing Phys: Isaias Cowman MD  Sonographer Comments: Suboptimal subcostal window. IMPRESSIONS  1. Left ventricular ejection fraction, by estimation, is 65 to 70%. The left ventricle has normal function. The left ventricle has no regional wall motion abnormalities. Left ventricular diastolic parameters were normal.  2. Right ventricular systolic function is normal. The right ventricular size is normal.  3. The mitral valve is normal in structure. Trivial mitral valve regurgitation. No evidence of mitral stenosis.  4. The aortic valve is  normal in structure. Aortic valve regurgitation is not visualized. No aortic stenosis is present.  5. The inferior vena cava is normal in size with greater than 50% respiratory variability, suggesting right atrial pressure of 3 mmHg. FINDINGS  Left Ventricle: Left ventricular ejection fraction, by estimation, is 65 to 70%. The left ventricle has normal function. The left ventricle has no regional wall motion abnormalities. The left ventricular internal cavity size was normal in size. There is  no left ventricular hypertrophy. Left ventricular diastolic parameters were normal. Right Ventricle: The right ventricular size is normal. No increase in right ventricular wall thickness. Right ventricular systolic function is normal. Left Atrium: Left atrial size was normal in size. Right Atrium: Right atrial size was normal in size. Pericardium: There is no evidence of pericardial effusion. Mitral Valve: The mitral valve is normal in structure. Trivial mitral valve regurgitation. No evidence of mitral valve  stenosis. Tricuspid Valve: The tricuspid valve is normal in structure. Tricuspid valve regurgitation is trivial. No evidence of tricuspid stenosis. Aortic Valve: The aortic valve is normal in structure. Aortic valve regurgitation is not visualized. No aortic stenosis is present. Aortic valve mean gradient measures 3.0 mmHg. Aortic valve peak gradient measures 7.0 mmHg. Aortic valve area, by VTI measures 3.62 cm. Pulmonic Valve: The pulmonic valve was normal in structure. Pulmonic valve regurgitation is not visualized. No evidence of pulmonic stenosis. Aorta: The aortic root is normal in size and structure. Venous: The inferior vena cava is normal in size with greater than 50% respiratory variability, suggesting right atrial pressure of 3 mmHg. IAS/Shunts: No atrial level shunt detected by color flow Doppler.  LEFT VENTRICLE PLAX 2D LVIDd:         4.91 cm  Diastology LVIDs:         2.55 cm  LV e' medial:    60.35 cm/s LV PW:         0.88 cm  LV E/e' medial:  1.8 LV IVS:        0.80 cm  LV e' lateral:   12.50 cm/s LVOT diam:     2.00 cm  LV E/e' lateral: 8.6 LV SV:         88 LV SV Index:   53 LVOT Area:     3.14 cm  LEFT ATRIUM             Index LA diam:        3.30 cm 1.98 cm/m LA Vol (A2C):   69.9 ml 41.92 ml/m LA Vol (A4C):   44.9 ml 26.93 ml/m LA Biplane Vol: 58.8 ml 35.27 ml/m  AORTIC VALVE AV Area (Vmax):    3.00 cm AV Area (Vmean):   3.17 cm AV Area (VTI):     3.62 cm AV Vmax:           132.00 cm/s AV Vmean:          85.500 cm/s AV VTI:            0.243 m AV Peak Grad:      7.0 mmHg AV Mean Grad:      3.0 mmHg LVOT Vmax:         126.00 cm/s LVOT Vmean:        86.400 cm/s LVOT VTI:          0.280 m LVOT/AV VTI ratio: 1.15  AORTA Ao Root diam: 2.80 cm MITRAL VALVE MV Area (PHT): 3.74 cm     SHUNTS MV Decel Time: 203 msec  Systemic VTI:  0.28 m MV E velocity: 108.00 cm/s  Systemic Diam: 2.00 cm MV A velocity: 64.70 cm/s MV E/A ratio:  1.67 Isaias Cowman MD Electronically signed by Isaias Cowman MD Signature Date/Time: 08/19/2020/10:07:27 AM    Final     Anti-infectives: Anti-infectives (From admission, onward)   Start     Dose/Rate Route Frequency Ordered Stop   08/18/20 0600  ceFAZolin (ANCEF) IVPB 2g/100 mL premix        2 g 200 mL/hr over 30 Minutes Intravenous On call to O.R. 08/17/20 1819 08/17/20 1935   08/18/20 0200  ceFAZolin (ANCEF) IVPB 2g/100 mL premix        2 g 200 mL/hr over 30 Minutes Intravenous Every 8 hours 08/18/20 0107 08/18/20 1053   08/17/20 1834  ceFAZolin (ANCEF) 2-4 GM/100ML-% IVPB       Note to Pharmacy: Shary Key   : cabinet override      08/17/20 1834 08/18/20 1053      Assessment/Plan: s/p Procedure(s): THROMBECTOMY BRACHIAL ARTERY (Right) ENDARTERECTOMY BRACHIAL (Right) BYPASS AXILLA/BRACHIAL ARTERY (Right) POD #2 S/P Right axillary to common ulnar bypass using Artegraft  Monitor right arm/bypass closely- serial exams; still remains at risk for limb loss. Continue Eliquis Will await ECHO results. BP control Replace electrolytes.  LOS: 2 days    Evaristo Bury 08/19/2020

## 2020-08-19 NOTE — Progress Notes (Signed)
NAME:  Suzanne [name], MRN:  [id number], DOB:  11/21/1975, LOS: 2 ADMISSION DATE:  08/17/2020, CONSULTATION DATE: 08/18/2020 REFERRING MD: Dr. Lorenso Courier, CHIEF COMPLAINT: Right upper extremity ischemia  History of Present Illness:  45 year old female presented to [facility] ED due to acute onset of pain and paleness in her right upper extremity.  The patient has a history of right upper extremity stent placement approximately a month ago complicated by thrombectomy/occlusion which was addressed on 08/16/2020 via angioplasty.  Per ED documentation the patient reported at approximately 2:30 PM on 08/17/2020 she was reaching down to grab a drink when she felt a " pop".  Patient states this was immediately followed by 10/10 pain and paleness to the right upper extremity.  Patient reported she ran out of her blood thinners approximately 2 weeks ago and has not been taking any since. ED course: Patient was evaluated bedside by Dr. Delana Meyer with vascular surgery with plans to take the patient to the OR as soon as possible for possible bypass procedure.  Heparin was initiated as well as pain control.  Patient also had hypertensive urgency in the ED with SBP in the 180s to 200s.  Patient at that time was rating her pain as " horrific" and not well controlled with any pain medications. Initial vitals: Afebrile at 98.4, tachypneic at 24, NSR at 79,  OR: Vascular surgery completed right axillary to common ulnar bypass using Artegraft, a right axillary enterectomy & exploration of right groin for possible saphenous vein harvest for bypass conduit.  The patient was intubated for the procedure and able to be extubated postprocedure without complication.   PCCM consulted for overnight pain management & monitoring. Pertinent  Medical History  Aortic thrombus Deep vein thrombosis CRPS Splenic infarct  Events:   . 08/17/2020-patient taken emergently to OR for bypass of right upper extremity . 08/18/2020-patient admitted to ICU  for monitoring, hemodynamically stable . 08/19/20- patient still requires q2h pulse checks on right upper extermity.  I could not feel pulse on right radial during exam.  Interim History / Subjective:  Patient alert and responsive, complaining of pain/numbness/tingling in right hand.  Right hand and forearm feel cold to touch, slightly dusky, no palpable pulse.  Right axillary graft has dopplerable pulse.  Objective   Blood pressure (!) 157/111, pulse 78, temperature 98 F (36.7 C), temperature source Oral, resp. rate 12, height 5\' 7"  (1.702 m), weight 57.6 kg, SpO2 95 %.        Intake/Output Summary (Last 24 hours) at 08/19/2020 1242 Last data filed at 08/19/2020 1143 Gross per 24 hour  Intake 148.19 ml  Output 450 ml  Net -301.81 ml   Filed Weights   08/17/20 1619  Weight: 57.6 kg    Examination: General: Adult female, critically ill, lying in bed, in pain- NAD HEENT: MM pink/moist, anicteric, atraumatic, neck supple Neuro: A&O x 4, able to follow commands, PERRL +3, MAE CV: s1s2 RRR, NSR on monitor, no r/m/g Pulm: Regular, non labored on room air, breath sounds clear-BUL & diminished-BLL GI: soft, rounded, non tender, bs x 4 Skin: Scabbed abrasions and mild duskiness on right hand Extremities: warm/dry, +2 pulses noted in all but right extremity/axillary graft pulse auscultated on Doppler, no edema noted    Assessment & Plan:    Right Upper extremity ischemia status post right axillary to common ulnar bypass using Artegraft PMHx: Aortic thrombus, DVTs, complex regional pain syndrome type I (CRPS) -Continuous cardiac monitoring -Doppler auxiliary graft pulse every 2 -Monitor  for s/s of bleeding at site -Continue Dilaudid as needed & Percocet as needed for pain control -Consider additional pain management/Precedex drip if needed -Continue metoprolol as needed to maintain HR less than 130 -Continue hydralazine & labetalol as needed for blood pressure control, maintain SBP  less than 160  Best practice (right click and "Reselect all SmartList Selections" daily)  Diet:  NPO Pain/Anxiety/Delirium protocol (if indicated): No VAP protocol (if indicated): Not indicated DVT prophylaxis: LMWH GI prophylaxis: H2B Glucose control:  SSI No Central venous access:  N/A Arterial line:  Yes, and it is still needed Foley:  N/A Mobility:  OOB  PT consulted: N/A Last date of multidisciplinary goals of care discussion 08/17/20 Code Status:  full code Disposition: ICU  Labs   CBC: Recent Labs  Lab 08/17/20 1622 08/18/20 0344 08/19/20 0421  WBC 16.4* 17.8* 17.0*  HGB 10.7* 9.8* 8.3*  HCT 34.2* 30.5* 26.8*  MCV 79.5* 78.2* 81.2  PLT 423* 391 419*    Basic Metabolic Panel: Recent Labs  Lab 08/16/20 0924 08/17/20 1622 08/18/20 0119 08/18/20 0344 08/19/20 0421  NA  --  132* 133*  --  137  K  --  2.9* 3.8  --  3.4*  CL  --  103 105  --  107  CO2  --  18* 20*  --  23  GLUCOSE  --  146* 176*  --  100*  BUN 7 7 8   --  7  CREATININE 0.70 0.89 0.94  --  0.68  CALCIUM  --  8.6* 8.4*  --  8.1*  MG  --   --   --  1.7 1.9  PHOS  --   --   --  2.6 3.1   GFR: Estimated Creatinine Clearance: 82.5 mL/min (by C-G formula based on SCr of 0.68 mg/dL). Recent Labs  Lab 08/17/20 1622 08/18/20 0344 08/19/20 0421  WBC 16.4* 17.8* 17.0*    Liver Function Tests: Recent Labs  Lab 08/17/20 1622  AST 26  ALT 9  ALKPHOS 66  BILITOT 0.8  PROT 7.5  ALBUMIN 3.7   No results for input(s): LIPASE, AMYLASE in the last 168 hours. No results for input(s): AMMONIA in the last 168 hours.  ABG No results found for: PHART, PCO2ART, PO2ART, HCO3, TCO2, ACIDBASEDEF, O2SAT   Coagulation Profile: Recent Labs  Lab 08/17/20 1622  INR 1.1    Cardiac Enzymes: No results for input(s): CKTOTAL, CKMB, CKMBINDEX, TROPONINI in the last 168 hours.  HbA1C: No results found for: HGBA1C  CBG: Recent Labs  Lab 08/18/20 0050  GLUCAP 173*    Review of Systems: Positives  in bold  Gen: Denies fever, chills, weight change, fatigue, night sweats HEENT: Denies blurred vision, double vision, hearing loss, tinnitus, sinus congestion, rhinorrhea, sore throat, neck stiffness, dysphagia PULM: Denies shortness of breath, cough, sputum production, hemoptysis, wheezing CV: Denies chest pain, edema, orthopnea, paroxysmal nocturnal dyspnea, palpitations GI: Denies abdominal pain, nausea, vomiting, diarrhea, hematochezia, melena, constipation, change in bowel habits GU: Denies dysuria, hematuria, polyuria, oliguria, urethral discharge Endocrine: Denies hot or cold intolerance, polyuria, polyphagia or appetite change Derm: Denies rash, dry skin, scaling or peeling skin change Heme: Denies easy bruising, bleeding, bleeding gums, pain/numbness/tingling/paleness in right upper extremity Neuro: Denies headache, numbness, weakness, slurred speech, loss of memory or consciousness  Past Medical History:  She,  has a past medical history of Aortic thrombus ([place]), CRPS (complex regional pain syndrome type I) (12/2019), and 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   Surgical History:  Past Surgical History:  Procedure Laterality Date  . BYPASS AXILLA/BRACHIAL ARTERY Right 08/17/2020   Procedure: BYPASS AXILLA/BRACHIAL ARTERY;  Surgeon: Katha Cabal, MD;  Location: [facility] ORS;  Service: Vascular;  Laterality: Right;  . CESAREAN SECTION     times 4  . EMBOLECTOMY Right 03/09/2020   Procedure: Brachial EMBOLECTOMY;  Surgeon: Algernon Huxley, MD;  Location: [facility] ORS;  Service: Vascular;  Laterality: Right;  . ENDARTERECTOMY Right 08/17/2020   Procedure: ENDARTERECTOMY BRACHIAL;  Surgeon: Katha Cabal, MD;  Location: [facility] ORS;  Service: Vascular;  Laterality: Right;  . THROMBECTOMY BRACHIAL ARTERY Right 08/17/2020   Procedure: THROMBECTOMY BRACHIAL ARTERY;  Surgeon: Katha Cabal, MD;  Location: [facility] ORS;  Service: Vascular;  Laterality: Right;  . UPPER EXTREMITY ANGIOGRAPHY Right 05/28/2020    Procedure: UPPER EXTREMITY ANGIOGRAPHY;  Surgeon: Algernon Huxley, MD;  Location: [name];  Service: Cardiovascular;  Laterality: Right;  . UPPER EXTREMITY ANGIOGRAPHY Right 08/16/2020   Procedure: UPPER EXTREMITY ANGIOGRAPHY;  Surgeon: Algernon Huxley, MD;  Location: [facility] [facility];  Service: Cardiovascular;  Laterality: Right;     Social History:   reports that she quit smoking about 5 weeks ago. Her smoking use included cigarettes. She has a 5.00 pack-year smoking history. She has never used smokeless tobacco. She reports previous alcohol use. She reports that she does not use drugs.   Family History:  Her family history includes Hypertension in her mother. She was adopted.   Allergies No Known Allergies   Home Medications  Prior to Admission medications   Medication Sig Start Date End Date Taking? Authorizing Provider  albuterol (PROVENTIL HFA;VENTOLIN HFA) 108 (90 Base) MCG/ACT inhaler Inhale 2 puffs into the lungs every 6 (six) hours as needed for wheezing or shortness of breath. Patient not taking: No sig reported 11/21/16   Merlyn Lot, MD  apixaban (ELIQUIS) 5 MG TABS tablet Take 1 tablet (5 mg total) by mouth 2 (two) times daily. 08/16/20   Algernon Huxley, MD  aspirin EC 81 MG tablet Take 1 tablet (81 mg total) by mouth daily. 05/28/20   Algernon Huxley, MD  HYDROcodone-acetaminophen (NORCO/VICODIN) 5-325 MG tablet Take 1 tablet by mouth every 6 (six) hours as needed for moderate pain. 08/16/20   Algernon Huxley, MD     Critical care provider statement:    Critical care time (minutes):  33   Critical care time was exclusive of:  Separately billable procedures and  treating other patients   Critical care was necessary to treat or prevent imminent or  life-threatening deterioration of the following conditions:  acute critical limb ischemia of RUE, multiple comorbid conditiosn   Critical care was time spent personally by me on the following  activities:  Development of  treatment plan with patient or surrogate,  discussions with consultants, evaluation of patient's response to  treatment, examination of patient, obtaining history from patient or  surrogate, ordering and performing treatments and interventions, ordering  and review of laboratory studies and re-evaluation of patient's condition   I assumed direction of critical care for this patient from another  provider in my specialty: no      Ottie Glazier, M.D.  Pulmonary & [place]

## 2020-08-20 DIAGNOSIS — M79603 Pain in arm, unspecified: Secondary | ICD-10-CM | POA: Diagnosis not present

## 2020-08-20 DIAGNOSIS — I998 Other disorder of circulatory system: Secondary | ICD-10-CM

## 2020-08-20 DIAGNOSIS — I10 Essential (primary) hypertension: Secondary | ICD-10-CM

## 2020-08-20 DIAGNOSIS — I742 Embolism and thrombosis of arteries of the upper extremities: Secondary | ICD-10-CM | POA: Diagnosis not present

## 2020-08-20 LAB — BASIC METABOLIC PANEL
Anion gap: 6 (ref 5–15)
BUN: 10 mg/dL (ref 6–20)
CO2: 25 mmol/L (ref 22–32)
Calcium: 8 mg/dL — ABNORMAL LOW (ref 8.9–10.3)
Chloride: 104 mmol/L (ref 98–111)
Creatinine, Ser: 0.71 mg/dL (ref 0.44–1.00)
GFR, Estimated: >60 mL/min (ref >60)
Glucose, Bld: 96 mg/dL (ref 70–99)
Potassium: 3.9 mmol/L (ref 3.5–5.1)
Sodium: 135 mmol/L (ref 135–145)

## 2020-08-20 LAB — CBC WITH DIFFERENTIAL/PLATELET
Abs Immature Granulocytes: 0.05 10*3/uL (ref 0.00–0.07)
Basophils Absolute: 0.1 10*3/uL (ref 0.0–0.1)
Basophils Relative: 1 %
Eosinophils Absolute: 0.5 10*3/uL (ref 0.0–0.5)
Eosinophils Relative: 4 %
HCT: 25.4 % — ABNORMAL LOW (ref 36.0–46.0)
Hemoglobin: 7.9 g/dL — ABNORMAL LOW (ref 12.0–15.0)
Immature Granulocytes: 0 %
Lymphocytes Relative: 35 %
Lymphs Abs: 4.5 10*3/uL — ABNORMAL HIGH (ref 0.7–4.0)
MCH: 25.2 pg — ABNORMAL LOW (ref 26.0–34.0)
MCHC: 31.1 g/dL (ref 30.0–36.0)
MCV: 80.9 fL (ref 80.0–100.0)
Monocytes Absolute: 0.9 10*3/uL (ref 0.1–1.0)
Monocytes Relative: 7 %
Neutro Abs: 7.1 10*3/uL (ref 1.7–7.7)
Neutrophils Relative %: 53 %
Platelets: 451 10*3/uL — ABNORMAL HIGH (ref 150–400)
RBC: 3.14 MIL/uL — ABNORMAL LOW (ref 3.87–5.11)
RDW: 18.9 % — ABNORMAL HIGH (ref 11.5–15.5)
WBC: 13.1 10*3/uL — ABNORMAL HIGH (ref 4.0–10.5)
nRBC: 0 % (ref 0.0–0.2)

## 2020-08-20 LAB — MAGNESIUM: Magnesium: 1.8 mg/dL (ref 1.7–2.4)

## 2020-08-20 LAB — PHOSPHORUS: Phosphorus: 4.3 mg/dL (ref 2.5–4.6)

## 2020-08-20 MED ORDER — FAMOTIDINE 20 MG PO TABS
20.0000 mg | ORAL_TABLET | Freq: Two times a day (BID) | ORAL | Status: DC
  Administered 2020-08-20: 20 mg via ORAL
  Filled 2020-08-20: qty 1

## 2020-08-20 MED ORDER — MAGNESIUM SULFATE 2 GM/50ML IV SOLN
2.0000 g | Freq: Once | INTRAVENOUS | Status: AC
  Administered 2020-08-20: 2 g via INTRAVENOUS
  Filled 2020-08-20: qty 50

## 2020-08-20 MED ORDER — AMLODIPINE BESYLATE 10 MG PO TABS
10.0000 mg | ORAL_TABLET | Freq: Every day | ORAL | Status: DC
  Administered 2020-08-20: 10 mg via ORAL
  Filled 2020-08-20: qty 1

## 2020-08-20 MED ORDER — OXYCODONE-ACETAMINOPHEN 5-325 MG PO TABS: 1.0000 | ORAL_TABLET | Freq: Four times a day (QID) | ORAL | 0 refills | Status: DC | PRN

## 2020-08-20 MED ORDER — ATORVASTATIN CALCIUM 20 MG PO TABS: 20.0000 mg | ORAL_TABLET | Freq: Every day | ORAL | 3 refills | Status: DC

## 2020-08-20 NOTE — Progress Notes (Signed)
NAME:  Suzanne [name], MRN:  [id number], DOB:  1976/03/01, LOS: 3 ADMISSION DATE:  08/17/2020, CONSULTATION DATE: 08/18/2020 REFERRING MD: Dr. Lorenso Courier, CHIEF COMPLAINT: Right upper extremity ischemia  History of Present Illness:  45 year old female presented to [facility] ED due to acute onset of pain and paleness in her right upper extremity.  The patient has a history of right upper extremity stent placement approximately a month ago complicated by thrombectomy/occlusion which was addressed on 08/16/2020 via angioplasty.  Per ED documentation the patient reported at approximately 2:30 PM on 08/17/2020 she was reaching down to grab a drink when she felt a " pop".  Patient states this was immediately followed by 10/10 pain and paleness to the right upper extremity.  Patient reported she ran out of her blood thinners approximately 2 weeks ago and has not been taking any since. ED course: Patient was evaluated bedside by Dr. Delana Meyer with vascular surgery with plans to take the patient to the OR as soon as possible for possible bypass procedure.  Heparin was initiated as well as pain control.  Patient also had hypertensive urgency in the ED with SBP in the 180s to 200s.  Patient at that time was rating her pain as " horrific" and not well controlled with any pain medications. Initial vitals: Afebrile at 98.4, tachypneic at 24, NSR at 79,  OR: Vascular surgery completed right axillary to common ulnar bypass using Artegraft, a right axillary enterectomy & exploration of right groin for possible saphenous vein harvest for bypass conduit.  The patient was intubated for the procedure and able to be extubated postprocedure without complication.   PCCM consulted for overnight pain management & monitoring.   Events:   . 08/17/2020-patient taken emergently to OR for bypass of right upper extremity . 08/18/2020-patient admitted to ICU for monitoring, hemodynamically stable  Interim History / Subjective:  Patient states  pain is well controlled, feeling much better, asking to go home Remain hypertensive, started on amlodipine  Objective   Blood pressure (!) 188/101, pulse 76, temperature 98.1 F (36.7 C), temperature source Oral, resp. rate 18, height 5\' 7"  (1.702 m), weight 57.6 kg, SpO2 95 %.        Intake/Output Summary (Last 24 hours) at 08/20/2020 1043 Last data filed at 08/19/2020 1143 Gross per 24 hour  Intake 50 ml  Output --  Net 50 ml   Filed Weights   08/17/20 1619  Weight: 57.6 kg    Examination: General: Middle-age female, lying in the bed, not in acute distress HEENT: MM pink/moist, anicteric, atraumatic, neck supple Neuro: A&O x 4, able to follow commands, PERRL +3, MAE CV: s1s2 RRR, NSR on monitor, no r/m/g Pulm: Regular, non labored on room air, breath sounds GI: soft, rounded, non tender, bs x 4 Skin: Scabbed abrasions and mild duskiness on right hand Extremities: warm/dry, +2 pulses noted in all but right extremity/axillary graft pulse auscultated on Doppler, no edema noted    Assessment & Plan:   Right Upper extremity ischemia status post right axillary to common ulnar bypass using Artegraft PMHx: Aortic thrombus, DVTs, complex regional pain syndrome type I (CRPS) Check Doppler axiliary graft pulse per protocol Monitor for s/s of bleeding at site Continue Dilaudid as needed & Percocet as needed for pain control  Hypertension Patient has not been taking medications at home, echocardiogram showed LVH I think she has high blood pressure for quite some time Started on amlodipine 10 mg once daily Continue hydralazine & labetalol as needed for  blood pressure control, maintain SBP less than 160  Best practice (right click and "Reselect all SmartList Selections" daily)  Diet: Heart healthy diet Pain/Anxiety/Delirium protocol (if indicated): No VAP protocol (if indicated): Not indicated DVT prophylaxis: LMWH GI prophylaxis: H2B Glucose control:  SSI No Central venous  access:  N/A Arterial line: N/A Foley:  N/A Mobility:  OOB  PT consulted: N/A Last date of multidisciplinary goals of care discussion 08/17/20 Code Status:  full code Disposition: Per primary team  PCCM will sign off, please call with questions  Labs   CBC: Recent Labs  Lab 08/17/20 1622 08/18/20 0344 08/19/20 0421 08/20/20 0334  WBC 16.4* 17.8* 17.0* 13.1*  NEUTROABS  --   --   --  7.1  HGB 10.7* 9.8* 8.3* 7.9*  HCT 34.2* 30.5* 26.8* 25.4*  MCV 79.5* 78.2* 81.2 80.9  PLT 423* 391 419* 451*    Basic Metabolic Panel: Recent Labs  Lab 08/16/20 0924 08/17/20 1622 08/18/20 0119 08/18/20 0344 08/19/20 0421 08/20/20 0334  NA  --  132* 133*  --  137 135  K  --  2.9* 3.8  --  3.4* 3.9  CL  --  103 105  --  107 104  CO2  --  18* 20*  --  23 25  GLUCOSE  --  146* 176*  --  100* 96  BUN 7 7 8   --  7 10  CREATININE 0.70 0.89 0.94  --  0.68 0.71  CALCIUM  --  8.6* 8.4*  --  8.1* 8.0*  MG  --   --   --  1.7 1.9 1.8  PHOS  --   --   --  2.6 3.1 4.3   GFR: Estimated Creatinine Clearance: 82.5 mL/min (by C-G formula based on SCr of 0.71 mg/dL). Recent Labs  Lab 08/17/20 1622 08/18/20 0344 08/19/20 0421 08/20/20 0334  WBC 16.4* 17.8* 17.0* 13.1*    Liver Function Tests: Recent Labs  Lab 08/17/20 1622  AST 26  ALT 9  ALKPHOS 66  BILITOT 0.8  PROT 7.5  ALBUMIN 3.7   No results for input(s): LIPASE, AMYLASE in the last 168 hours. No results for input(s): AMMONIA in the last 168 hours.  ABG No results found for: PHART, PCO2ART, PO2ART, HCO3, TCO2, ACIDBASEDEF, O2SAT   Coagulation Profile: Recent Labs  Lab 08/17/20 1622  INR 1.1    Cardiac Enzymes: No results for input(s): CKTOTAL, CKMB, CKMBINDEX, TROPONINI in the last 168 hours.  HbA1C: No results found for: HGBA1C  CBG: Recent Labs  Lab 08/18/20 0050  GLUCAP 173*    Jacky Kindle MD [facility] See Amion for pager If no response to pager, please call [phone] until  7pm After 7pm, Please call E-link [phone]

## 2020-08-20 NOTE — Discharge Summary (Signed)
[facility]    Discharge Summary  Patient ID:  Suzanne [name] MRN: [id number] DOB/AGE: [birth year] 45 y.o.  Admit date: 08/17/2020 Discharge date: 08/20/2020 Date of Surgery: 08/17/2020 Surgeon: Surgeon(s): Schnier, Dolores Lory, MD Evaristo Bury, MD  Admission Diagnosis: Ischemia of extremity [I99.8]  Discharge Diagnoses:  Ischemia of extremity [I99.8]  Secondary Diagnoses: Past Medical History:  Diagnosis Date  . Aortic thrombus ([place])   . CRPS (complex regional pain syndrome type I) 12/2019  . 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  Right axillary to common ulnar bypass using Artegraft. 2.  Right axillary endarterectomy. 3.  Exploration right groin for possible saphenous vein harvest for bypass conduit  Discharged Condition: Good  HPI / Hospital Course:  Suzanne [name] is a 45 y.o. female who presents with ischemia of her right hand.  She is status post intervention for ischemic symptoms just yesterday and has already rethrombosed.  After review the images it appeared that her ulnar artery was a reasonable target and that a bypass may proved to be more durable.  The risks and benefits for bypass surgery were reviewed all questions were answered alternative therapies were also discussed.  Amputation in particular the patient adamantly stated that she would not let anybody amputate her hand and she wanted everything done. Therefore we elected to move forward with bypass surgery. On 08/17/20, the patient underwent:  1.  Right axillary to common ulnar bypass using Artegraft. 2.  Right axillary endarterectomy. 3.  Exploration right groin for possible saphenous vein harvest for bypass conduit  She tolerated the procedure well was transferred from the operating room to the ICU without issue.  The remainder of the patient's inpatient stay was without complication.  Patient was tolerating regular diet, urinating independently, her discomfort was  controlled with the use of p.o. pain medication and she was ambulating at baseline.  Day of discharge, the patient was afebrile with stable vital signs.  Physical Exam:  Alert and oriented x3, no acute distress Regular rate and rhythm Clear to station bilaterally Abdomen is soft, nondistended nontender positive bowel sounds Vascular:  Right upper extremity: Extremities warm distally to fingers.  Faint pulse.  Bypasses patent on exam.  Good capillary refill.  Good motor/sensory.  Labs: As below  Complications: None  Consults: None  Significant Diagnostic Studies: CBC Lab Results  Component Value Date   WBC 13.1 (H) 08/20/2020   HGB 7.9 (L) 08/20/2020   HCT 25.4 (L) 08/20/2020   MCV 80.9 08/20/2020   PLT 451 (H) 08/20/2020   BMET    Component Value Date/Time   NA 135 08/20/2020 0334   NA 140 04/23/2011 1538   K 3.9 08/20/2020 0334   K 3.9 04/23/2011 1538   CL 104 08/20/2020 0334   CL 105 04/23/2011 1538   CO2 25 08/20/2020 0334   CO2 25 04/23/2011 1538   GLUCOSE 96 08/20/2020 0334   GLUCOSE 117 (H) 04/23/2011 1538   BUN 10 08/20/2020 0334   BUN 2 (L) 04/23/2011 1538   CREATININE 0.71 08/20/2020 0334   CREATININE 0.63 04/23/2011 1538   CALCIUM 8.0 (L) 08/20/2020 0334   CALCIUM 8.9 04/23/2011 1538   GFRNONAA >60 08/20/2020 0334   GFRNONAA >60 04/23/2011 1538   GFRAA >60 12/22/2018 0839   GFRAA >60 04/23/2011 1538   COAG Lab Results  Component Value Date   INR 1.1 08/17/2020   INR 1.1 03/09/2020   INR 1.1 12/23/2018   Disposition:  Discharge  to :Home  Allergies as of 08/20/2020   No Known Allergies     Medication List    STOP taking these medications   HYDROcodone-acetaminophen 5-325 MG tablet Commonly known as: NORCO/VICODIN   sulfamethoxazole-trimethoprim 800-160 MG tablet Commonly known as: BACTRIM DS   traMADol 50 MG tablet Commonly known as: ULTRAM     TAKE these medications   albuterol 108 (90 Base) MCG/ACT inhaler Commonly known as:  VENTOLIN HFA Inhale 2 puffs into the lungs every 6 (six) hours as needed for wheezing or shortness of breath.   apixaban 5 MG Tabs tablet Commonly known as: ELIQUIS Take 1 tablet (5 mg total) by mouth 2 (two) times daily.   aspirin EC 81 MG tablet Take 1 tablet (81 mg total) by mouth daily.   atorvastatin 20 MG tablet Commonly known as: LIPITOR Take 1 tablet (20 mg total) by mouth daily. Start taking on: Aug 21, 2020   oxyCODONE-acetaminophen 5-325 MG tablet Commonly known as: PERCOCET/ROXICET Take 1 tablet by mouth every 6 (six) hours as needed for moderate pain.      Verbal and written Discharge instructions given to the patient. Wound care per Discharge AVS  Follow-up Information    Schnier, Dolores Lory, MD Follow up in 2 week(s).   Specialties: Vascular Surgery, Cardiology, Radiology, Vascular Surgery Why: Can see Schnier or Arna Medici.  Will need arterial duplex with visit. Contact information: [place] [address] [phone]              Signed: Sela Hua, PA-C  08/20/2020, 1:17 PM

## 2020-08-20 NOTE — Progress Notes (Signed)
OT Cancellation Note  Patient Details Name: Suzanne [name] MRN: [id number] DOB: 1975-11-22   Cancelled Treatment:    Reason Eval/Treat Not Completed: Patient declined, no reason specified. Upon attempt, pt reporting very eager to discharge and return home and tells therapist she will be very upset if she's unable to discharge today. OT offered to relay the message to the RN but pt declined stating the RN already knows. OT provided fine motor coordination exercise handout. Pt declined education on handout but agreeable to take it home and do exercises for RUE at home. Will re-attempt next date if still here.   Hanley Hays, MPH, MS, OTR/L ascom [phone] 08/20/20, 11:57 AM

## 2020-08-20 NOTE — Discharge Instructions (Addendum)
You may shower. Please do not engage in strenuous activity or lifting greater than 10 pounds until you are cleared at your first post procedure follow-up.

## 2020-08-20 NOTE — Progress Notes (Signed)
Patients site to the right graft area palpated with the doppler throughout the shift. 2+ pulses noted every two hours. Given percocet for pain x1. Given PRN bp medication for elevated bp. Toleraterd well and were effective. Call bell in reach. Will continue to monitor.

## 2020-08-21 LAB — SURGICAL PATHOLOGY

## 2020-08-29 ENCOUNTER — Other Ambulatory Visit (INDEPENDENT_AMBULATORY_CARE_PROVIDER_SITE_OTHER): Payer: Self-pay | Admitting: Vascular Surgery

## 2020-08-29 ENCOUNTER — Telehealth (INDEPENDENT_AMBULATORY_CARE_PROVIDER_SITE_OTHER): Payer: Self-pay

## 2020-08-29 DIAGNOSIS — Z95828 Presence of other vascular implants and grafts: Secondary | ICD-10-CM

## 2020-08-29 DIAGNOSIS — I998 Other disorder of circulatory system: Secondary | ICD-10-CM

## 2020-08-30 ENCOUNTER — Telehealth (INDEPENDENT_AMBULATORY_CARE_PROVIDER_SITE_OTHER): Payer: Self-pay

## 2020-08-30 ENCOUNTER — Other Ambulatory Visit (INDEPENDENT_AMBULATORY_CARE_PROVIDER_SITE_OTHER): Payer: Self-pay | Admitting: Vascular Surgery

## 2020-08-30 MED ORDER — OXYCODONE-ACETAMINOPHEN 5-325 MG PO TABS: 1.0000 | ORAL_TABLET | Freq: Four times a day (QID) | ORAL | 0 refills | Status: DC | PRN

## 2020-08-30 NOTE — Telephone Encounter (Signed)
Spoke with GS and she should take pain meds as prescribed and we will see her at her follow up appt

## 2020-08-30 NOTE — Telephone Encounter (Signed)
Patient was made aware medical recommendation and verbalized understanding.

## 2020-08-30 NOTE — Telephone Encounter (Signed)
   I called the pt and made her aware that the rest of her Rx has been sent over. I made  The pt aware that per Dr. Delana Meyer that if she has pain after this she will have to go to the ER.

## 2020-08-30 NOTE — Telephone Encounter (Signed)
Pt called an left a VM on the nurses line saying that the script that our office sent to her office, her pharmacy can only be half way filled  And that they have not got the Rx. I called the pt's pharmacy and they made me aware that per the pt's insure she has a cap on the amount of narcotics she can receive for a 5 day supply. She can on get 20 of those pill and she looses the rest due to the cap this was explained the pt by the pharmacist she was also made aware that if she wanted the full 5 day supply she would have to pay out of pocket  Or have the provider write another Rx for 8 pills to finish the Rx.

## 2020-09-03 ENCOUNTER — Ambulatory Visit (INDEPENDENT_AMBULATORY_CARE_PROVIDER_SITE_OTHER): Payer: Medicaid Other

## 2020-09-03 ENCOUNTER — Other Ambulatory Visit: Payer: Self-pay

## 2020-09-03 ENCOUNTER — Ambulatory Visit (INDEPENDENT_AMBULATORY_CARE_PROVIDER_SITE_OTHER): Payer: Medicaid Other | Admitting: Nurse Practitioner

## 2020-09-03 VITALS — BP 145/79 | HR 77 | Ht 67.0 in | Wt 126.0 lb

## 2020-09-03 DIAGNOSIS — I998 Other disorder of circulatory system: Secondary | ICD-10-CM | POA: Diagnosis not present

## 2020-09-03 DIAGNOSIS — Z95828 Presence of other vascular implants and grafts: Secondary | ICD-10-CM

## 2020-09-03 DIAGNOSIS — E785 Hyperlipidemia, unspecified: Secondary | ICD-10-CM | POA: Diagnosis not present

## 2020-09-03 MED ORDER — OXYCODONE-ACETAMINOPHEN 10-325 MG PO TABS: 1.0000 | ORAL_TABLET | Freq: Four times a day (QID) | ORAL | 0 refills | Status: DC | PRN

## 2020-09-03 MED ORDER — AMOXICILLIN-POT CLAVULANATE 875-125 MG PO TABS: 1.0000 | ORAL_TABLET | Freq: Two times a day (BID) | ORAL | 0 refills | Status: DC

## 2020-09-09 NOTE — Progress Notes (Signed)
Subjective:    Patient ID: Suzanne [name], female    DOB: December 07, 1975, 45 y.o.   MRN: [id number] Chief Complaint  Patient presents with  . Follow-up    2 Week [facility] post UE angio UE arterial     Suzanne [name] returns today for follow-up after intervention to her right upper extremity.  The patient has had multiple interventions to the right upper extremity.  The patient has a long history of intervention.  The patient is known to be hypercoagulable and initially she was found to have an aortic thrombus in 2019.  Patient was started on Coumadin but was not consistent with checking her INR levels and again subtherapeutic which resulted in multiple emboli including her renal arteries, infrarenal aorta as well as profunda femoris arteries.  Following hospitalization and anticoagulation the patient was discharged in stable.  On 03/09/2020 the patient first presented with a cold hand which subsequently required intervention.  Following revascularization the patient was transitioned to Eliquis and she notes that she was compliant during that timeframe.  The patient also continues to smoke despite numerous recommendations to stop.  The patient subsequently required intervention on 05/30/2020.  The patient did well following this intervention for some time until her hand again became painful and she slammed her hand in a car door.  The patient was scheduled for angiogram however she missed to scheduling's for angiogram.  The patient also stopped her Eliquis for approximately 2 weeks.  She subsequently underwent endovascular intervention on 08/16/2020.  Unfortunately less than 24 hours later the patient really presented to the hospital on 08/17/2020 again with a cold hand.  Because of the rapid occlusion it was made to try to perform a bypass in order to save her right upper extremity.  Vein salvage was not possible so and Artegraft was used.  The patient notes that since her surgery she has had pain in her arm she  is not been able to sleep and has fall and sprained her wrist.  She denies any fevers or chills.  Today her noninvasive studies show that her axillary artery as well as her brachial artery is occluded.  She has dampened monophasic flow in the distal subclavian as well as the distal radial artery.   Review of Systems  Hematological: Bruises/bleeds easily.  All other systems reviewed and are negative.      Objective:   Physical Exam Vitals reviewed.  HENT:     Head: Normocephalic.  Cardiovascular:     Rate and Rhythm: Normal rate.     Pulses:          Radial pulses are 0 on the right side.  Pulmonary:     Effort: Pulmonary effort is normal.  Skin:    General: Skin is warm.  Neurological:     Mental Status: She is alert and oriented to person, place, and time.  Psychiatric:        Mood and Affect: Affect is tearful.        Behavior: Behavior is agitated.        Thought Content: Thought content normal.        Judgment: Judgment normal.     BP (!) 145/79   Pulse 77   Ht 5\' 7"  (1.702 m)   Wt 126 lb (57.2 kg)   BMI 19.73 kg/m   Past Medical History:  Diagnosis Date  . Aortic thrombus ([place])   . CRPS (complex regional pain syndrome type I) 12/2019  . Splenic infarct  12/2014    Social History   Socioeconomic History  . Marital status: Divorced    Spouse name: Not on file  . Number of children: Not on file  . Years of education: Not on file  . Highest education level: Not on file  Occupational History  . Not on file  Tobacco Use  . Smoking status: Former Smoker    Packs/day: 0.25    Years: 20.00    Pack years: 5.00    Types: Cigarettes    Quit date: 07/15/2020    Years since quitting: 0.1  . Smokeless tobacco: Never Used  Vaping Use  . Vaping Use: Never used  Substance and Sexual Activity  . Alcohol use: Not Currently    Comment: occ  . Drug use: No  . Sexual activity: Not on file  Other Topics Concern  . Not on file  Social History Narrative  . Not on  file   Social Determinants of Health   Financial Resource Strain: Not on file  Food Insecurity: Not on file  Transportation Needs: Not on file  Physical Activity: Not on file  Stress: Not on file  Social Connections: Not on file  Intimate Partner Violence: Not on file    Past Surgical History:  Procedure Laterality Date  . BYPASS AXILLA/BRACHIAL ARTERY Right 08/17/2020   Procedure: BYPASS AXILLA/BRACHIAL ARTERY;  Surgeon: Katha Cabal, MD;  Location: [facility] ORS;  Service: Vascular;  Laterality: Right;  . CESAREAN SECTION     times 4  . EMBOLECTOMY Right 03/09/2020   Procedure: Brachial EMBOLECTOMY;  Surgeon: Algernon Huxley, MD;  Location: [facility] ORS;  Service: Vascular;  Laterality: Right;  . ENDARTERECTOMY Right 08/17/2020   Procedure: ENDARTERECTOMY BRACHIAL;  Surgeon: Katha Cabal, MD;  Location: [facility] ORS;  Service: Vascular;  Laterality: Right;  . THROMBECTOMY BRACHIAL ARTERY Right 08/17/2020   Procedure: THROMBECTOMY BRACHIAL ARTERY;  Surgeon: Katha Cabal, MD;  Location: [facility] OR[facility];  Service: Vascular;  Laterality: Right;  . UPPER EXTREMITY ANGIOGRAPHY Right 05/28/2020   Procedure: UPPER EXTREMITY ANGIOGRAPHY;  Surgeon: Algernon Huxley, MD;  Location: [place] [facility];  Service: Cardiovascular;  Laterality: Right;  . UPPER EXTREMITY ANGIOGRAPHY Right 08/16/2020   Procedure: UPPER EXTREMITY ANGIOGRAPHY;  Surgeon: Algernon Huxley, MD;  Location: [place] [facility];  Service: Cardiovascular;  Laterality: Right;    Family History  Adopted: Yes  Problem Relation Age of Onset  . Hypertension Mother     No Known Allergies  CBC Latest Ref Rng & Units 08/20/2020 08/19/2020 08/18/2020  WBC 4.0 - 10.5 K/uL 13.1(H) 17.0(H) 17.8(H)  Hemoglobin 12.0 - 15.0 g/dL 7.9(L) 8.3(L) 9.8(L)  Hematocrit 36.0 - 46.0 % 25.4(L) 26.8(L) 30.5(L)  Platelets 150 - 400 K/uL 451(H) 419(H) 391      CMP     Component Value Date/Time   NA 135 08/20/2020 0334   NA 140 04/23/2011 1538   K 3.9  08/20/2020 0334   K 3.9 04/23/2011 1538   CL 104 08/20/2020 0334   CL 105 04/23/2011 1538   CO2 25 08/20/2020 0334   CO2 25 04/23/2011 1538   GLUCOSE 96 08/20/2020 0334   GLUCOSE 117 (H) 04/23/2011 1538   BUN 10 08/20/2020 0334   BUN 2 (L) 04/23/2011 1538   CREATININE 0.71 08/20/2020 0334   CREATININE 0.63 04/23/2011 1538   CALCIUM 8.0 (L) 08/20/2020 0334   CALCIUM 8.9 04/23/2011 1538   PROT 7.5 08/17/2020 1622   PROT 7.9 04/23/2011 1538  ALBUMIN 3.7 08/17/2020 1622   ALBUMIN 3.5 04/23/2011 1538   AST 26 08/17/2020 1622   AST 21 04/23/2011 1538   ALT 9 08/17/2020 1622   ALT 17 04/23/2011 1538   ALKPHOS 66 08/17/2020 1622   ALKPHOS 73 04/23/2011 1538   BILITOT 0.8 08/17/2020 1622   BILITOT 0.4 04/23/2011 1538   GFRNONAA >60 08/20/2020 0334   GFRNONAA >60 04/23/2011 1538   GFRAA >60 12/22/2018 0839   GFRAA >60 04/23/2011 1538     No results found.     Assessment & Plan:   1. Ischemia of extremity Unfortunately at this time we have exhausted endovascular as well as surgical intervention possibilities for the patient's circulation in her right upper extremity.  I had a long and difficult conversation with the patient in regards to her options.  Given the gravity of the situation, a second opinion was suggested.  We will be happy to place referral if the patient wishes.  The other option would be amputation.  We will be able to perform amputation above the elbow however below the elbow we would need to refer her to an another vascular surgeon.  The patient need time to discuss with family and pastor.  The patient wishes to contact her office whenever she is ready to move forward with which ever decision she makes.  2. Hyperlipidemia, unspecified hyperlipidemia type Continue statin as ordered and reviewed, no changes at this time    Current Outpatient Medications on File Prior to Visit  Medication Sig Dispense Refill  . albuterol (PROVENTIL HFA;VENTOLIN HFA) 108 (90 Base)  MCG/ACT inhaler Inhale 2 puffs into the lungs every 6 (six) hours as needed for wheezing or shortness of breath. 1 Inhaler 2  . apixaban (ELIQUIS) 5 MG TABS tablet Take 1 tablet (5 mg total) by mouth 2 (two) times daily. 60 tablet 5  . aspirin EC 81 MG tablet Take 1 tablet (81 mg total) by mouth daily. 150 tablet 2  . atorvastatin (LIPITOR) 20 MG tablet Take 1 tablet (20 mg total) by mouth daily. 90 tablet 3   No current facility-administered medications on file prior to visit.    There are no Patient Instructions on file for this visit. No follow-ups on file.   Kris Hartmann, NP

## 2020-09-10 ENCOUNTER — Encounter (INDEPENDENT_AMBULATORY_CARE_PROVIDER_SITE_OTHER): Payer: Self-pay | Admitting: Nurse Practitioner

## 2021-04-03 ENCOUNTER — Other Ambulatory Visit (INDEPENDENT_AMBULATORY_CARE_PROVIDER_SITE_OTHER): Payer: Self-pay | Admitting: Vascular Surgery

## 2021-04-04 NOTE — Telephone Encounter (Signed)
Pts elliquis was approved an pt was made aware.

## 2021-04-21 ENCOUNTER — Other Ambulatory Visit (INDEPENDENT_AMBULATORY_CARE_PROVIDER_SITE_OTHER): Payer: Self-pay | Admitting: Nurse Practitioner

## 2021-06-14 ENCOUNTER — Telehealth (INDEPENDENT_AMBULATORY_CARE_PROVIDER_SITE_OTHER): Payer: Self-pay

## 2021-06-14 ENCOUNTER — Other Ambulatory Visit (INDEPENDENT_AMBULATORY_CARE_PROVIDER_SITE_OTHER): Payer: Self-pay | Admitting: Nurse Practitioner

## 2021-06-14 MED ORDER — APIXABAN 2.5 MG PO TABS: ORAL_TABLET | ORAL | 1 refills | Status: DC

## 2021-06-14 NOTE — Telephone Encounter (Signed)
I sent in a two week refill but that will be the last refill that will send for the patient.  She has not been seen by our office since 08/2020.  Going forward she will need to be seen in office before she can have any refills

## 2021-06-14 NOTE — Telephone Encounter (Signed)
Patient has been made aware with medical advice and verbalized understanding 

## 2021-06-23 ENCOUNTER — Other Ambulatory Visit (INDEPENDENT_AMBULATORY_CARE_PROVIDER_SITE_OTHER): Payer: Self-pay | Admitting: Nurse Practitioner

## 2021-07-14 ENCOUNTER — Other Ambulatory Visit (INDEPENDENT_AMBULATORY_CARE_PROVIDER_SITE_OTHER): Payer: Self-pay | Admitting: Nurse Practitioner

## 2021-10-22 ENCOUNTER — Emergency Department: Payer: Medicaid Other

## 2021-10-22 ENCOUNTER — Other Ambulatory Visit: Payer: Self-pay

## 2021-10-22 ENCOUNTER — Encounter: Payer: Self-pay | Admitting: Emergency Medicine

## 2021-10-22 ENCOUNTER — Inpatient Hospital Stay
Admission: EM | Admit: 2021-10-22 | Discharge: 2021-10-30 | DRG: 356 | Disposition: A | Discharge status: Home / Self Care | Payer: Medicaid Other | Attending: Internal Medicine | Admitting: Internal Medicine

## 2021-10-22 DIAGNOSIS — K219 Gastro-esophageal reflux disease without esophagitis: Secondary | ICD-10-CM

## 2021-10-22 DIAGNOSIS — F17210 Nicotine dependence, cigarettes, uncomplicated: Secondary | ICD-10-CM | POA: Diagnosis present

## 2021-10-22 DIAGNOSIS — E876 Hypokalemia: Secondary | ICD-10-CM

## 2021-10-22 DIAGNOSIS — Z720 Tobacco use: Secondary | ICD-10-CM | POA: Diagnosis not present

## 2021-10-22 DIAGNOSIS — R109 Unspecified abdominal pain: Secondary | ICD-10-CM | POA: Diagnosis present

## 2021-10-22 DIAGNOSIS — Z79899 Other long term (current) drug therapy: Secondary | ICD-10-CM

## 2021-10-22 DIAGNOSIS — R1013 Epigastric pain: Secondary | ICD-10-CM | POA: Diagnosis not present

## 2021-10-22 DIAGNOSIS — Z91141 Patient's other noncompliance with medication regimen due to financial hardship: Secondary | ICD-10-CM | POA: Diagnosis not present

## 2021-10-22 DIAGNOSIS — E871 Hypo-osmolality and hyponatremia: Secondary | ICD-10-CM | POA: Diagnosis not present

## 2021-10-22 DIAGNOSIS — K819 Cholecystitis, unspecified: Secondary | ICD-10-CM | POA: Diagnosis not present

## 2021-10-22 DIAGNOSIS — K559 Vascular disorder of intestine, unspecified: Secondary | ICD-10-CM | POA: Diagnosis not present

## 2021-10-22 DIAGNOSIS — I472 Ventricular tachycardia, unspecified: Secondary | ICD-10-CM | POA: Diagnosis not present

## 2021-10-22 DIAGNOSIS — K7030 Alcoholic cirrhosis of liver without ascites: Secondary | ICD-10-CM | POA: Diagnosis present

## 2021-10-22 DIAGNOSIS — Z89211 Acquired absence of right upper limb below elbow: Secondary | ICD-10-CM

## 2021-10-22 DIAGNOSIS — T45516A Underdosing of anticoagulants, initial encounter: Secondary | ICD-10-CM | POA: Diagnosis present

## 2021-10-22 DIAGNOSIS — G8929 Other chronic pain: Secondary | ICD-10-CM | POA: Diagnosis not present

## 2021-10-22 DIAGNOSIS — R110 Nausea: Secondary | ICD-10-CM | POA: Diagnosis not present

## 2021-10-22 DIAGNOSIS — G43909 Migraine, unspecified, not intractable, without status migrainosus: Secondary | ICD-10-CM | POA: Diagnosis not present

## 2021-10-22 DIAGNOSIS — Z7982 Long term (current) use of aspirin: Secondary | ICD-10-CM

## 2021-10-22 DIAGNOSIS — M79604 Pain in right leg: Secondary | ICD-10-CM | POA: Diagnosis not present

## 2021-10-22 DIAGNOSIS — I700 Atherosclerosis of aorta: Secondary | ICD-10-CM | POA: Diagnosis present

## 2021-10-22 DIAGNOSIS — K763 Infarction of liver: Secondary | ICD-10-CM | POA: Diagnosis not present

## 2021-10-22 DIAGNOSIS — E785 Hyperlipidemia, unspecified: Secondary | ICD-10-CM | POA: Diagnosis present

## 2021-10-22 DIAGNOSIS — Z5986 Financial insecurity: Secondary | ICD-10-CM | POA: Diagnosis not present

## 2021-10-22 DIAGNOSIS — I7410 Embolism and thrombosis of unspecified parts of aorta: Principal | ICD-10-CM

## 2021-10-22 DIAGNOSIS — I70201 Unspecified atherosclerosis of native arteries of extremities, right leg: Secondary | ICD-10-CM | POA: Diagnosis present

## 2021-10-22 DIAGNOSIS — K810 Acute cholecystitis: Secondary | ICD-10-CM | POA: Diagnosis not present

## 2021-10-22 DIAGNOSIS — I4729 Other ventricular tachycardia: Secondary | ICD-10-CM

## 2021-10-22 DIAGNOSIS — G894 Chronic pain syndrome: Secondary | ICD-10-CM | POA: Diagnosis present

## 2021-10-22 DIAGNOSIS — I10 Essential (primary) hypertension: Secondary | ICD-10-CM | POA: Diagnosis present

## 2021-10-22 DIAGNOSIS — D6859 Other primary thrombophilia: Secondary | ICD-10-CM

## 2021-10-22 DIAGNOSIS — I70208 Unspecified atherosclerosis of native arteries of extremities, other extremity: Secondary | ICD-10-CM | POA: Diagnosis present

## 2021-10-22 DIAGNOSIS — F1010 Alcohol abuse, uncomplicated: Secondary | ICD-10-CM

## 2021-10-22 DIAGNOSIS — K5900 Constipation, unspecified: Secondary | ICD-10-CM | POA: Diagnosis not present

## 2021-10-22 DIAGNOSIS — I7409 Other arterial embolism and thrombosis of abdominal aorta: Secondary | ICD-10-CM | POA: Diagnosis present

## 2021-10-22 DIAGNOSIS — Z8249 Family history of ischemic heart disease and other diseases of the circulatory system: Secondary | ICD-10-CM

## 2021-10-22 DIAGNOSIS — J449 Chronic obstructive pulmonary disease, unspecified: Secondary | ICD-10-CM

## 2021-10-22 DIAGNOSIS — Z86718 Personal history of other venous thrombosis and embolism: Secondary | ICD-10-CM

## 2021-10-22 DIAGNOSIS — I708 Atherosclerosis of other arteries: Secondary | ICD-10-CM | POA: Diagnosis present

## 2021-10-22 DIAGNOSIS — K551 Chronic vascular disorders of intestine: Principal | ICD-10-CM | POA: Diagnosis present

## 2021-10-22 DIAGNOSIS — I739 Peripheral vascular disease, unspecified: Secondary | ICD-10-CM | POA: Diagnosis not present

## 2021-10-22 DIAGNOSIS — I774 Celiac artery compression syndrome: Secondary | ICD-10-CM | POA: Diagnosis not present

## 2021-10-22 DIAGNOSIS — Z7189 Other specified counseling: Secondary | ICD-10-CM | POA: Diagnosis not present

## 2021-10-22 DIAGNOSIS — Z7901 Long term (current) use of anticoagulants: Secondary | ICD-10-CM

## 2021-10-22 DIAGNOSIS — R1084 Generalized abdominal pain: Secondary | ICD-10-CM | POA: Diagnosis not present

## 2021-10-22 LAB — CBC
HCT: 34.6 % — ABNORMAL LOW (ref 36.0–46.0)
Hemoglobin: 10.6 g/dL — ABNORMAL LOW (ref 12.0–15.0)
MCH: 22.8 pg — ABNORMAL LOW (ref 26.0–34.0)
MCHC: 30.6 g/dL (ref 30.0–36.0)
MCV: 74.6 fL — ABNORMAL LOW (ref 80.0–100.0)
Platelets: 784 10*3/uL — ABNORMAL HIGH (ref 150–400)
RBC: 4.64 MIL/uL (ref 3.87–5.11)
RDW: 18.6 % — ABNORMAL HIGH (ref 11.5–15.5)
WBC: 14.7 10*3/uL — ABNORMAL HIGH (ref 4.0–10.5)
nRBC: 0 % (ref 0.0–0.2)

## 2021-10-22 LAB — APTT: aPTT: 30 seconds (ref 24–36)

## 2021-10-22 LAB — HEPATIC FUNCTION PANEL
ALT: 9 U/L (ref 0–44)
AST: 14 U/L — ABNORMAL LOW (ref 15–41)
Albumin: 3.2 g/dL — ABNORMAL LOW (ref 3.5–5.0)
Alkaline Phosphatase: 83 U/L (ref 38–126)
Bilirubin, Direct: <0.1 mg/dL (ref 0.0–0.2)
Total Bilirubin: 0.5 mg/dL (ref 0.3–1.2)
Total Protein: 7.7 g/dL (ref 6.5–8.1)

## 2021-10-22 LAB — TROPONIN I (HIGH SENSITIVITY)
Troponin I (High Sensitivity): 7 ng/L (ref <18)
Troponin I (High Sensitivity): 9 ng/L (ref <18)

## 2021-10-22 LAB — BASIC METABOLIC PANEL
Anion gap: 11 (ref 5–15)
BUN: 6 mg/dL (ref 6–20)
CO2: 21 mmol/L — ABNORMAL LOW (ref 22–32)
Calcium: 9.1 mg/dL (ref 8.9–10.3)
Chloride: 103 mmol/L (ref 98–111)
Creatinine, Ser: 0.82 mg/dL (ref 0.44–1.00)
GFR, Estimated: >60 mL/min (ref >60)
Glucose, Bld: 121 mg/dL — ABNORMAL HIGH (ref 70–99)
Potassium: 3.8 mmol/L (ref 3.5–5.1)
Sodium: 135 mmol/L (ref 135–145)

## 2021-10-22 LAB — PROTIME-INR
INR: 1.1 (ref 0.8–1.2)
Prothrombin Time: 14 seconds (ref 11.4–15.2)

## 2021-10-22 LAB — LACTIC ACID, PLASMA
Lactic Acid, Venous: 0.9 mmol/L (ref 0.5–1.9)
Lactic Acid, Venous: 1.3 mmol/L (ref 0.5–1.9)

## 2021-10-22 LAB — POC URINE PREG, ED: Preg Test, Ur: NEGATIVE

## 2021-10-22 LAB — LIPASE, BLOOD: Lipase: 50 U/L (ref 11–51)

## 2021-10-22 MED ORDER — PANTOPRAZOLE SODIUM 40 MG IV SOLR
40.0000 mg | Freq: Two times a day (BID) | INTRAVENOUS | Status: DC
  Administered 2021-10-22 – 2021-10-25 (×6): 40 mg via INTRAVENOUS
  Filled 2021-10-22 (×6): qty 10

## 2021-10-22 MED ORDER — OXYCODONE HCL 5 MG PO TABS
10.0000 mg | ORAL_TABLET | Freq: Four times a day (QID) | ORAL | Status: DC | PRN
  Administered 2021-10-23 – 2021-10-29 (×7): 10 mg via ORAL
  Filled 2021-10-22 (×8): qty 2

## 2021-10-22 MED ORDER — ACETAMINOPHEN 325 MG PO TABS
650.0000 mg | ORAL_TABLET | Freq: Four times a day (QID) | ORAL | Status: DC | PRN
  Administered 2021-10-25: 650 mg via ORAL
  Filled 2021-10-22: qty 2

## 2021-10-22 MED ORDER — HEPARIN (PORCINE) 25000 UT/250ML-% IV SOLN
INTRAVENOUS | Status: AC
  Filled 2021-10-22: qty 250

## 2021-10-22 MED ORDER — SENNOSIDES-DOCUSATE SODIUM 8.6-50 MG PO TABS: 1.0000 | ORAL_TABLET | Freq: Every evening | ORAL | Status: DC | PRN

## 2021-10-22 MED ORDER — OXYCODONE-ACETAMINOPHEN 10-325 MG PO TABS: 1.0000 | ORAL_TABLET | Freq: Four times a day (QID) | ORAL | Status: DC | PRN

## 2021-10-22 MED ORDER — ONDANSETRON HCL 4 MG PO TABS: 4.0000 mg | ORAL_TABLET | Freq: Four times a day (QID) | ORAL | Status: DC | PRN

## 2021-10-22 MED ORDER — LACTATED RINGERS IV SOLN: INTRAVENOUS | Status: DC

## 2021-10-22 MED ORDER — PROCHLORPERAZINE EDISYLATE 10 MG/2ML IJ SOLN
10.0000 mg | Freq: Once | INTRAMUSCULAR | Status: AC
Start: 2021-10-22 — End: 2021-10-22
  Administered 2021-10-22: 10 mg via INTRAVENOUS
  Filled 2021-10-22: qty 2

## 2021-10-22 MED ORDER — HEPARIN BOLUS VIA INFUSION
4000.0000 [IU] | Freq: Once | INTRAVENOUS | Status: AC
  Administered 2021-10-22: 4000 [IU] via INTRAVENOUS
  Filled 2021-10-22: qty 4000

## 2021-10-22 MED ORDER — MORPHINE SULFATE (PF) 4 MG/ML IV SOLN
4.0000 mg | INTRAVENOUS | Status: DC | PRN
  Administered 2021-10-22: 4 mg via INTRAVENOUS
  Filled 2021-10-22: qty 1

## 2021-10-22 MED ORDER — HYDROMORPHONE HCL 1 MG/ML IJ SOLN
0.5000 mg | INTRAMUSCULAR | Status: DC | PRN
  Administered 2021-10-22 – 2021-10-25 (×18): 0.5 mg via INTRAVENOUS
  Filled 2021-10-22 (×17): qty 0.5

## 2021-10-22 MED ORDER — BUDESONIDE 0.5 MG/2ML IN SUSP
0.5000 mg | Freq: Two times a day (BID) | RESPIRATORY_TRACT | Status: DC
  Filled 2021-10-22: qty 2

## 2021-10-22 MED ORDER — ATORVASTATIN CALCIUM 20 MG PO TABS
80.0000 mg | ORAL_TABLET | Freq: Every day | ORAL | Status: DC
  Administered 2021-10-23 – 2021-10-30 (×7): 80 mg via ORAL
  Filled 2021-10-22 (×7): qty 4

## 2021-10-22 MED ORDER — HEPARIN (PORCINE) 25000 UT/250ML-% IV SOLN
1250.0000 [IU]/h | INTRAVENOUS | Status: DC
  Administered 2021-10-22: 1100 [IU]/h via INTRAVENOUS
  Administered 2021-10-23 – 2021-10-24 (×2): 1250 [IU]/h via INTRAVENOUS
  Filled 2021-10-22 (×2): qty 250

## 2021-10-22 MED ORDER — IOHEXOL 350 MG/ML SOLN
80.0000 mL | Freq: Once | INTRAVENOUS | Status: AC | PRN
  Administered 2021-10-22: 80 mL via INTRAVENOUS

## 2021-10-22 MED ORDER — ACETAMINOPHEN 650 MG RE SUPP: 650.0000 mg | Freq: Four times a day (QID) | RECTAL | Status: DC | PRN

## 2021-10-22 MED ORDER — ONDANSETRON HCL 4 MG/2ML IJ SOLN
4.0000 mg | Freq: Once | INTRAMUSCULAR | Status: AC
  Administered 2021-10-22: 4 mg via INTRAVENOUS
  Filled 2021-10-22: qty 2

## 2021-10-22 MED ORDER — SODIUM CHLORIDE 0.9 % IV BOLUS
1000.0000 mL | Freq: Once | INTRAVENOUS | Status: AC
  Administered 2021-10-22: 1000 mL via INTRAVENOUS

## 2021-10-22 MED ORDER — HYDROMORPHONE HCL 1 MG/ML IJ SOLN
0.5000 mg | Freq: Once | INTRAMUSCULAR | Status: AC
  Administered 2021-10-22: 0.5 mg via INTRAVENOUS
  Filled 2021-10-22: qty 0.5

## 2021-10-22 MED ORDER — ONDANSETRON HCL 4 MG/2ML IJ SOLN
4.0000 mg | Freq: Four times a day (QID) | INTRAMUSCULAR | Status: DC | PRN
  Administered 2021-10-22 – 2021-10-25 (×9): 4 mg via INTRAVENOUS
  Filled 2021-10-22 (×9): qty 2

## 2021-10-22 MED ORDER — ALBUTEROL SULFATE HFA 108 (90 BASE) MCG/ACT IN AERS
2.0000 | INHALATION_SPRAY | Freq: Four times a day (QID) | RESPIRATORY_TRACT | Status: DC | PRN
Start: 2021-10-22 — End: 2021-10-22

## 2021-10-22 MED ORDER — PIPERACILLIN-TAZOBACTAM 3.375 G IVPB
3.3750 g | Freq: Three times a day (TID) | INTRAVENOUS | Status: DC
  Administered 2021-10-22 – 2021-10-30 (×22): 3.375 g via INTRAVENOUS
  Filled 2021-10-22 (×22): qty 50

## 2021-10-22 MED ORDER — NICOTINE 21 MG/24HR TD PT24
21.0000 mg | MEDICATED_PATCH | Freq: Every day | TRANSDERMAL | Status: DC
  Administered 2021-10-22 – 2021-10-30 (×9): 21 mg via TRANSDERMAL
  Filled 2021-10-22 (×9): qty 1

## 2021-10-22 MED ORDER — ACETAMINOPHEN 325 MG PO TABS
325.0000 mg | ORAL_TABLET | Freq: Four times a day (QID) | ORAL | Status: DC | PRN
Start: 2021-10-22 — End: 2021-10-29
  Administered 2021-10-28 – 2021-10-29 (×2): 325 mg via ORAL
  Filled 2021-10-22 (×2): qty 1

## 2021-10-22 MED ORDER — ALBUTEROL SULFATE (2.5 MG/3ML) 0.083% IN NEBU: 2.5000 mg | INHALATION_SOLUTION | Freq: Four times a day (QID) | RESPIRATORY_TRACT | Status: DC | PRN

## 2021-10-22 MED ORDER — IPRATROPIUM-ALBUTEROL 0.5-2.5 (3) MG/3ML IN SOLN
3.0000 mL | Freq: Four times a day (QID) | RESPIRATORY_TRACT | Status: DC
  Filled 2021-10-22 (×2): qty 3

## 2021-10-22 NOTE — ED Provider Notes (Signed)
[facility] Provider Note    Event Date/Time   First MD Initiated Contact with Patient 10/22/21 1545     (approximate)   History   Abdominal Pain   HPI  Suzanne [name] is a 46 y.o. female extensive vascular disease history supposed to be on Eliquis has not been on her Eliquis for the past 2 months presents to the ER for sudden onset abdominal pain nausea vomiting.  States it feels similar to when she was diagnosed with recent renal infarcts.  She does continue to smoke.  She is off her Eliquis because she states that she is not been able to afford to see a doctor.  States that over the past several months her right leg has started becoming more painful with ambulation and is noticed that it is thinner than the left and has developed some discoloration.  States her pain today is mild to moderate.     Physical Exam   Triage Vital Signs: ED Triage Vitals  Enc Vitals Group     BP 10/22/21 1405 (!) 162/110     Pulse Rate 10/22/21 1405 92     Resp 10/22/21 1405 20     Temp 10/22/21 1405 98.3 F (36.8 C)     Temp Source 10/22/21 1405 Oral     SpO2 10/22/21 1405 98 %     Weight 10/22/21 1407 135 lb (61.2 kg)     Height 10/22/21 1407 '5\' 7"'$  (1.702 m)     Head Circumference --      Peak Flow --      Pain Score 10/22/21 1407 8     Pain Loc --      Pain Edu? --      Excl. in [place]? --     Most recent vital signs: Vitals:   10/22/21 2030 10/22/21 2136  BP: (!) 143/70 (!) 189/101  Pulse: 66 (!) 58  Resp: 17 18  Temp: 98.9 F (37.2 C) 97.6 F (36.4 C)  SpO2: 96% 97%     Constitutional: Alert  Eyes: Conjunctivae are normal.  Head: Atraumatic. Nose: No congestion/rhinnorhea. Mouth/Throat: Mucous membranes are moist.   Neck: Painless ROM.  Cardiovascular:   Good peripheral circulation. Respiratory: Normal respiratory effort.  No retractions.  Gastrointestinal: Soft and nontender.skin mottling to abdominal wall  Musculoskeletal:  no deformity. No  palpable pulse to dp or pt bilaterally but rle feel slightly cooler than left and cap refill 1-2 sec delayed compared to left Neurologic:  MAE spontaneously. No gross focal neurologic deficits are appreciated.  Skin:  Skin is warm, dry and intact. No rash noted. Psychiatric: Mood and affect are normal. Speech and behavior are normal.    ED Results / Procedures / Treatments   Labs (all labs ordered are listed, but only abnormal results are displayed) Labs Reviewed  BASIC METABOLIC PANEL - Abnormal; Notable for the following components:      Result Value   CO2 21 (*)    Glucose, Bld 121 (*)    All other components within normal limits  CBC - Abnormal; Notable for the following components:   WBC 14.7 (*)    Hemoglobin 10.6 (*)    HCT 34.6 (*)    MCV 74.6 (*)    MCH 22.8 (*)    RDW 18.6 (*)    Platelets 784 (*)    All other components within normal limits  HEPATIC FUNCTION PANEL - Abnormal; Notable for the following components:   Albumin 3.2 (*)  AST 14 (*)    All other components within normal limits  LIPASE, BLOOD  LACTIC ACID, PLASMA  LACTIC ACID, PLASMA  APTT  PROTIME-INR  HEPARIN LEVEL (UNFRACTIONATED)  CBC  HEPARIN LEVEL (UNFRACTIONATED)  APTT  BASIC METABOLIC PANEL  HIV ANTIBODY (ROUTINE TESTING W REFLEX)  POC URINE PREG, ED  TROPONIN I (HIGH SENSITIVITY)  TROPONIN I (HIGH SENSITIVITY)     EKG  ED ECG REPORT I, Merlyn Lot, the attending physician, personally viewed and interpreted this ECG.   Date: 10/22/2021  EKG Time: 14:12  Rate: 80  Rhythm: sinus  Axis: normal  Intervals:normal qt  ST&T Change: no stemi, no depression    RADIOLOGY Please see ED Course for my review and interpretation.  I personally reviewed all radiographic images ordered to evaluate for the above acute complaints and reviewed radiology reports and findings.  These findings were personally discussed with the patient.  Please see medical record for radiology  report.    PROCEDURES:  Critical Care performed: Yes, see critical care procedure note(s)  .Critical Care  Performed by: Merlyn Lot, MD Authorized by: Merlyn Lot, MD   Critical care provider statement:    Critical care time (minutes):  45   Critical care was necessary to treat or prevent imminent or life-threatening deterioration of the following conditions:  Circulatory failure   Critical care was time spent personally by me on the following activities:  Ordering and performing treatments and interventions, ordering and review of laboratory studies, ordering and review of radiographic studies, pulse oximetry, re-evaluation of patient's condition, review of old charts, obtaining history from patient or surrogate, examination of patient, evaluation of patient's response to treatment, discussions with primary provider, discussions with consultants and development of treatment plan with patient or surrogate    MEDICATIONS ORDERED IN ED: Medications  morphine (PF) 4 MG/ML injection 4 mg (4 mg Intravenous Given 10/22/21 1616)  HYDROmorphone (DILAUDID) injection 0.5 mg (0.5 mg Intravenous Given 10/22/21 2138)  heparin ADULT infusion 100 units/mL (25000 units/260m) (1,100 Units/hr Intravenous Rate/Dose Verify 10/22/21 1920)  acetaminophen (TYLENOL) tablet 650 mg (has no administration in time range)    Or  acetaminophen (TYLENOL) suppository 650 mg (has no administration in time range)  ondansetron (ZOFRAN) tablet 4 mg ( Oral See Alternative 10/22/21 2138)    Or  ondansetron (ZOFRAN) injection 4 mg (4 mg Intravenous Given 10/22/21 2138)  albuterol (PROVENTIL) (2.5 MG/3ML) 0.083% nebulizer solution 2.5 mg (has no administration in time range)  oxyCODONE (Oxy IR/ROXICODONE) immediate release tablet 10 mg (has no administration in time range)    And  acetaminophen (TYLENOL) tablet 325 mg (has no administration in time range)  lactated ringers infusion (has no administration in time  range)  piperacillin-tazobactam (ZOSYN) IVPB 3.375 g (has no administration in time range)  nicotine (NICODERM CQ - dosed in mg/24 hours) patch 21 mg (21 mg Transdermal Patch Applied 10/22/21 2150)  ipratropium-albuterol (DUONEB) 0.5-2.5 (3) MG/3ML nebulizer solution 3 mL (has no administration in time range)  atorvastatin (LIPITOR) tablet 80 mg (has no administration in time range)  budesonide (PULMICORT) nebulizer solution 0.5 mg (has no administration in time range)  prochlorperazine (COMPAZINE) injection 10 mg (has no administration in time range)  pantoprazole (PROTONIX) injection 40 mg (has no administration in time range)  sodium chloride 0.9 % bolus 1,000 mL (0 mLs Intravenous Stopped 10/22/21 1744)  ondansetron (ZOFRAN) injection 4 mg (4 mg Intravenous Given 10/22/21 1616)  iohexol (OMNIPAQUE) 350 MG/ML injection 80 mL (80 mLs Intravenous Contrast  Given 10/22/21 1730)  heparin bolus via infusion 4,000 Units ( Intravenous Canceled Entry 10/22/21 1921)  HYDROmorphone (DILAUDID) injection 0.5 mg (0.5 mg Intravenous Given 10/22/21 1950)     IMPRESSION / MDM / ASSESSMENT AND PLAN / ED COURSE  I reviewed the triage vital signs and the nursing notes.                              Differential diagnosis includes, but is not limited to, infarct, aaa, dissection, embolism, sbo, pancreatitis, biliary pathology  Patient presented to the ER for evaluation of symptoms as described above.  Patient with extensive vascular history not currently on anticoagulation with presentation concerning for the above differential.  This presenting complaint could reflect a potentially life-threatening illness therefore the patient will be placed on continuous pulse oximetry and telemetry for monitoring.  Laboratory evaluation will be sent to evaluate for the above complaints.  Have ordered IV fluids IV narcotic pain medication antiemetic and CT imaging.  Clinical Course as of 10/22/21 2300  Tue Oct 22, 2021  1612 Chest  x-ray on my review and interpretation does not show any evidence of pneumothorax or edema. [PR]  8115 My review and interpretation of CTA does appear that she is has worsening extensive aortic thrombus burden.  I will start IV heparin.  Will consult vascular surgery. [PR]  1937 Dr. Trula Slade vascular surgery has evaluated patient at bedside.  As lactate normal and her exam otherwise reassuring she is now feeling better we will hold off on operative intervention at this time and is recommended hospitalist admission for heparinization IV fluids and vascular surgery will continue to follow. [PR]    Clinical Course User Index [PR] Merlyn Lot, MD    Patient's presentation is most consistent with acute presentation with potential threat to life or bodily function.   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    Merlyn Lot, MD 10/22/21 2300

## 2021-10-22 NOTE — Progress Notes (Signed)
Patient c/o intense nausea and pain. Deferred respiratory meds til in morning.

## 2021-10-22 NOTE — Consult Note (Signed)
ANTICOAGULATION CONSULT NOTE - Follow Up Consult  Pharmacy Consult for Heparin gtt Indication:  aortic thrombus/MI  No Known Allergies  Patient Measurements: Height: '5\' 7"'$  (170.2 cm) Weight: 61.2 kg (135 lb) IBW/kg (Calculated) : 61.6 Heparin Dosing Weight: 61.2kg  Vital Signs: Temp: 98.4 F (36.9 C) (07/04 1750) Temp Source: Oral (07/04 1750) BP: 164/84 (07/04 1750) Pulse Rate: 76 (07/04 1750)  Labs: Recent Labs    10/22/21 1417  HGB 10.6*  HCT 34.6*  PLT 784*  CREATININE 0.82  TROPONINIHS 7  Heparin Dosing Weight: 61.2kg  Estimated Creatinine Clearance: 84.6 mL/min (by C-G formula based on SCr of 0.82 mg/dL).  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 Pharmacy consulted for mgmt of heparin gtt.  Date Time aPTT/HL Rate/Comment       Baseline Labs: aPTT - pending INR - pending HL - pending Hgb - 10.6 Plts - 784  Goal of Therapy:  Heparin level 0.3-0.7 units/ml aPTT 66-102 seconds Monitor platelets by anticoagulation protocol: Yes   Plan:  Give 4000 units bolus x1; then start heparin infusion at 1100 units/hr Check anti-Xa level in 6 hours and daily once consecutively therapeutic. Continue to monitor H&H and platelets daily while on heparin gtt.  BShanon BrowBeers 10/22/2021,6:14 PM

## 2021-10-22 NOTE — H&P (Signed)
History and Physical    Suzanne [name]: Suzanne [name] DOB: 09-16-1975 DOA: 10/22/2021 DOS: the Suzanne [name] was seen and examined on 10/22/2021 PCP: System, Provider Not In  Suzanne [name] coming from: Home  Chief Complaint:  Chief Complaint  Suzanne [name] presents with   Abdominal Pain   HPI: Suzanne [name] is a 46 y.o. female with medical history significant of hyperlipidemia, liver cirrhosis, COPD, multiple DVTs, (non-adherent to Eliquis), severe peripheral vascular disease with right upper extremity ischemia s/p 08/17/20 angioplasty (followed by Dr. Eulogio Ditch) who presented to the ED with severe fluctuating abdominal pain with tenderness to palpation across the entire upper abdomen that started a few weeks ago but has been getting worse over the last few days.  The abdominal pain is associated with dark bloody bowel movements.  She also endorses post-prandial pain over the last few weeks.  Blood pressure and heart rate are stable but become elevated when Suzanne [name] is in pain and anxious.  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  Suzanne [name] is very anxious and requests extra pain medications throughout the night.  She denies any fevers, chills, chest pain, shortness of breath, or urinary symptoms.  Review of Systems: As mentioned in the history of present illness. All other systems reviewed and are negative. 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   Past Surgical History:  Procedure Laterality Date   BYPASS AXILLA/BRACHIAL ARTERY Right 08/17/2020   Procedure: BYPASS AXILLA/BRACHIAL ARTERY;  Surgeon: Katha Cabal, MD;  Location: [facility];  Service: Vascular;  Laterality: Right;   CESAREAN SECTION     times 4   EMBOLECTOMY  Right 03/09/2020   Procedure: Brachial EMBOLECTOMY;  Surgeon: Algernon Huxley, MD;  Location: [facility];  Service: Vascular;  Laterality: Right;   ENDARTERECTOMY Right 08/17/2020   Procedure: ENDARTERECTOMY BRACHIAL;  Surgeon: Katha Cabal, MD;  Location: [facility];  Service: Vascular;  Laterality: Right;   THROMBECTOMY BRACHIAL ARTERY Right 08/17/2020   Procedure: THROMBECTOMY BRACHIAL ARTERY;  Surgeon: Katha Cabal, MD;  Location: [facility];  Service: Vascular;  Laterality: Right;   UPPER EXTREMITY ANGIOGRAPHY Right 05/28/2020   Procedure: UPPER EXTREMITY ANGIOGRAPHY;  Surgeon: Algernon Huxley, MD;  Location: [place] [facility];  Service: Cardiovascular;  Laterality: Right;   UPPER EXTREMITY ANGIOGRAPHY Right 08/16/2020   Procedure: UPPER EXTREMITY ANGIOGRAPHY;  Surgeon: Algernon Huxley, MD;  Location: [place] [facility];  Service: Cardiovascular;  Laterality: Right;   Social History:  reports that she quit smoking about 15 months ago. Her smoking use included cigarettes. She has a 5.00 pack-year smoking history. She has never used smokeless tobacco. She reports that she does not currently use alcohol. She reports that she does not use drugs.  No Known Allergies  Family History  Adopted: Yes  Problem Relation Age of Onset   Hypertension Mother     Prior to Admission medications   Medication Sig Start Date End Date Taking? Authorizing Provider  albuterol (PROVENTIL HFA;VENTOLIN HFA) 108 (90 Base) MCG/ACT inhaler Inhale 2 puffs into the lungs every 6 (six) hours as needed for wheezing or shortness of breath. 11/21/16   Merlyn Lot, MD  amoxicillin-clavulanate (AUGMENTIN) 875-125 MG tablet Take 1 tablet by mouth 2 (two) times daily. 09/03/20  Kris Hartmann, NP  aspirin EC 81 MG tablet Take 1 tablet (81 mg total) by mouth daily. 05/28/20   Algernon Huxley, MD  atorvastatin (LIPITOR) 20 MG tablet Take 1 tablet (20 mg total) by mouth daily. 08/21/20   Stegmayer, Kimberly A, PA-C  ELIQUIS 2.5 MG  TABS tablet TAKE 2 TABLETS(5 MG) BY MOUTH TWICE DAILY 07/15/21   Kris Hartmann, NP  oxyCODONE-acetaminophen (PERCOCET) 10-325 MG tablet Take 1 tablet by mouth every 6 (six) hours as needed for pain. 09/03/20   Kris Hartmann, NP    Physical Exam: Vitals:   10/22/21 1800 10/22/21 1830 10/22/21 1900 10/22/21 1930  BP: (!) 160/94 (!) 152/77 133/65 (!) 166/98  Pulse: 74 73 66 61  Resp: '20 17 18 15  '$ Temp:      TempSrc:      SpO2: 97% 98% 95% 99%  Weight:      Height:       Examination: General exam: chronically ill appearing thin pale appearing female lying in bed holding abdomen HEENT: NCAT, PERRL Respiratory system: mild bibasilar wheezing Cardiovascular system: Did not appreciate a murmur, regular, No JVD. Gastrointestinal system: Mottled skin, upper abdomen tender on deep palpation with mild distention, bowel sounds present Nervous System: No focal deficits. Extremities: right upper extremity amputation, lower extremities show no discoloration, 2(+) pulses, extremities warm, capillary refill < 3 seconds Skin: No icterus.  Data Reviewed:   CT Angio Chest/Abd/Pel for Dissection W and/or Wo Contrast Addendum: ADDENDUM REPORT: 10/22/2021 18:07   ADDENDUM:  These results were called by telephone at the time of interpretation  on 10/22/2021 at 6:07 pm to provider Merlyn Lot , who verbally  acknowledged these results.   Electronically Signed    By: Iven Finn M.D.    On: 10/22/2021 18:07 Narrative: CLINICAL DATA:  Aortic atherosclerosis. Pt via POV from home. Pt c/o epigastric pain that radiates to her back, SOB, headaches, and emesis that has been going on the past month, states today it is the worse it has ever been. States that she has been without her Eliquis  EXAM: CT ANGIOGRAPHY CHEST, ABDOMEN AND PELVIS  TECHNIQUE: Non-contrast CT of the chest was initially obtained.  Multidetector CT imaging through the chest, abdomen and pelvis was performed using the  standard protocol during bolus administration of intravenous contrast. Multiplanar reconstructed images and MIPs were obtained and reviewed to evaluate the vascular anatomy.  RADIATION DOSE REDUCTION: This exam was performed according to the departmental dose-optimization program which includes automated exposure control, adjustment of the mA and/or kV according to Suzanne [name] size and/or use of iterative reconstruction technique.  CONTRAST:  40m OMNIPAQUE IOHEXOL 350 MG/ML SOLN  COMPARISON:  CT angiography chest, abdomen, pelvis 03/10/2020  FINDINGS: CTA CHEST FINDINGS  Cardiovascular: Preferential opacification of the thoracic aorta. No evidence of thoracic aortic aneurysm or dissection. Severe atherosclerotic plaque with mid to distal descending thoracic aorta noncalcified atheromatous plaque. Thin central filling defect within the origin of the left subclavian artery likely chronic web or thrombus. No coronary artery calcifications. Normal heart size. No significant pericardial effusion. The main pulmonary artery is normal in caliber. No central or proximal segmental pulmonary embolus.  Mediastinum/Nodes: No enlarged mediastinal, hilar, or axillary lymph nodes. Thyroid gland, trachea, and esophagus demonstrate no significant findings.  Lungs/Pleura: Mild paraseptal emphysematous changes peer no focal consolidation. No pulmonary nodule. No pulmonary mass. No pleural effusion. No pneumothorax.  Musculoskeletal:  No chest wall abnormality.  No suspicious lytic or blastic osseous lesions.  No acute displaced fracture.  Review of the MIP images confirms the above findings.  CTA ABDOMEN AND PELVIS FINDINGS  VASCULAR  Aorta: Severe atherosclerotic plaque with marked noncalcified atheromatous plaque leading to diffuse stenosis of the abdominal aorta with the infrarenal abdominal aorta down to a caliber of 0.7 x 1.2 cm. Normal caliber aorta without aneurysm,  dissection, vasculitis.  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  SMA: Thin chronic thrombus string like thrombus within the origin of the superior mesenteric artery (6:140). Patent without evidence of aneurysm, dissection, vasculitis or significant stenosis.  Renals: Both renal arteries are patent without evidence of aneurysm, dissection, vasculitis, fibromuscular dysplasia or significant stenosis.  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  Inflow: Patent without evidence of aneurysm, dissection, vasculitis or significant stenosis.  Veins: No obvious venous abnormality within the limitations of this arterial phase study.  Review of the MIP images confirms the above findings.  NON-VASCULAR  Hepatobiliary: Query trace nodular hepatic contour. Liver measures at the upper limits of normal. No focal liver abnormality. No gallstones, gallbladder wall thickening, or pericholecystic fluid. No biliary dilatation.  Pancreas: No focal lesion. Normal pancreatic contour. No surrounding inflammatory changes. No main pancreatic ductal dilatation.  Spleen: Normal in size without focal abnormality.  Adrenals/Urinary Tract:  No adrenal nodule bilaterally.  Bilateral kidneys enhance symmetrically. Bilateral renal cortical scarring. Subcentimeter hypodensities too small to characterize. No definite wedge-shaped peripheral hypodensities suggest infarction.  No hydronephrosis. No hydroureter.  The urinary bladder is unremarkable.  Stomach/Bowel: Stomach is within normal limits. No evidence of bowel wall thickening or dilatation. No pneumatosis. Appendix appears normal.  Lymphatic: No lymphadenopathy.  Reproductive: Uterus and bilateral adnexa are unremarkable.  Other: No intraperitoneal free fluid. No intraperitoneal free gas. No  organized fluid collection.  Musculoskeletal:  No abdominal wall hernia or abnormality.  No suspicious lytic or blastic osseous lesions. No acute displaced fracture. Please moderate degenerative changes of the hips.  Review of the MIP images confirms the above findings.  IMPRESSION: 1. 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 2. Severe atherosclerotic plaque with marked noncalcified atheromatous plaque of the thoracic and abdominal aorta leading to diffuse stenosis with the infrarenal abdominal aorta down to a caliber of 0.7 x 1.2 cm. 3. 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 4. Thin chronic thrombus string like thrombus within the origin of the superior mesenteric artery and left subclavian artery. 5. No central or proximal segmental pulmonary embolus. 6. No intrathoracic, intra-abdominal, intrapelvic abnormality findings to suggest infarction/ischemia. 7. Query cirrhotic morphology of the liver. 8. Aortic Atherosclerosis (ICD10-I70.0) and Emphysema (ICD10-J43.9).  Electronically Signed: By: Iven Finn M.D. On: 10/22/2021 18:04 DG Chest 2 View CLINICAL DATA:  Acute shortness of breath.  EXAM: CHEST - 2 VIEW  COMPARISON:  11/20/2016 radiograph  FINDINGS: The cardiomediastinal silhouette is unremarkable.  There is no evidence of focal airspace disease, pulmonary edema, suspicious pulmonary nodule/mass, pleural effusion, or pneumothorax.  No acute bony abnormalities are identified.  IMPRESSION: No active cardiopulmonary disease.  Electronically Signed   By: Margarette Canada M.D.   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 - Consult Vascular Surgery, appreciate assistance and recommendations.  Given Suzanne [name]'s history,the plan is to avoid surgery if possible given the high risk of thrombectomy including embolic sequelae. - Given that surgery will be attempted to be delayed, we will start Zosyn to potentially avoid intestinal necrosis  - even though high level evidence is limited. - Start fluids and correct electrolytes as needed. - Give Oxycodone and IV Dilaudid for pain control.  Peripheral Vascular Disease with history of RUE ischemia s/p 08/16/20 Angioplasty: - Heparin Drip and Atorvastatin 80 mg daily.  History of Aortic Thrombus and GI infarctions / Unprovoked DVTs: Suzanne [name] is non-adherent to Eliquis at home due to financial constraints. - Continue anticoagulation as above.  GERD: - IV Protonix 40 mg BID.  Lower Extremity Pain in the setting of Peripheral Vascular Disease: - Check arterial ultrasound.  Complex Regional Pain Syndrome: - Pain meds as above.  Essential Hypertension: BP is elevated due to pain. - Monitor.  Chronic COPD: - Pulmicort and Duo-Nebs. - Hold off on systemic steroids to avoid potential exacerbation of ischemic damage in the gut.  Chronic Tobacco Use: - Give Nicotine patch. - Counseled on cessation.     Advance Care Planning:   Code Status: Full Code   Consults: Vascular Surgery  Family Communication: None present. I spoke with Suzanne [name].  Severity of Illness: The appropriate Suzanne [name] status for this Suzanne [name] is INPATIENT. Inpatient status is judged to be reasonable and necessary in order to provide the required intensity of service to ensure the Suzanne [name]'s safety. The Suzanne [name]'s presenting symptoms, physical exam findings, and initial radiographic and laboratory data in the context of their chronic comorbidities is felt to place them at high  risk for further clinical deterioration. Furthermore, it is not anticipated that the Suzanne [name] will be medically stable for discharge from the hospital within 2 midnights of admission.   * I certify that at the point of admission it is my clinical judgment that the Suzanne [name] will require inpatient hospital care spanning beyond 2 midnights from the point of admission due to high intensity of service, high risk for further deterioration and high frequency of surveillance required.*  Author: George Hugh, MD 10/22/2021 8:13 PM  For on call review [email or web address].

## 2021-10-22 NOTE — ED Triage Notes (Signed)
Pt via POV from home. Pt c/o epigastric pain that radiates to her back, SOB, headaches, and emesis that has been going on the past month, states today it is the worse it has ever been. States that she has been without her Eliquis for the past 2 months. Pt has blood clots issues. Pt is A&Ox4 and NAD.

## 2021-10-22 NOTE — ED Notes (Signed)
Pt in bed, pt continues to c/o abd pain, pain med given.

## 2021-10-22 NOTE — ED Notes (Signed)
Pt in ct 

## 2021-10-22 NOTE — ED Notes (Signed)
Pt in bed, pt back from ct, states that her pain is a 4/10, pt states that she doesn't need anything for pain at this time.

## 2021-10-22 NOTE — Consult Note (Addendum)
[facility]  Patient name: Suzanne [name] MRN: [id number] DOB: Mar 12, 1976 Sex: female   REQUESTING PROVIDER:    ER   REASON FOR CONSULT:    Abdominal pain  HISTORY OF PRESENT ILLNESS:   Suzanne [name] is a 46 y.o. female, who presented to the emergency department with severe abdominal pain that began this morning.  She states that she has been having some upper abdominal pain for several months but it got worse today which prompted her to come to the ER.  She states that she has been having some irregular bowel movements and that they have been dark.  She denies any postprandial abdominal pain or weight loss.  While in the ER she underwent a CT angiogram which showed extensive thoracic and abdominal aortic mural thrombus with occlusion of the celiac artery and inferior mesenteric artery.  The superior mesenteric artery was widely patent with a small string-like filling defect in the superior mesenteric artery.  She has been given pain medicine and feels better.  She states that she is very hungry.  The patient has an extensive vascular history.  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  She has seen hematology at [facility].  She was recommended to stay on lifelong anticoagulation.  Unfortunately due to financial constraints she stopped her anticoagulation about 2 months ago.  She is a smoker.  She also states that her right leg will hurt and give out after approximately 200 feet of walking.  She does not have rest pain in her legs or wounds.  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     FAMILY HISTORY   Family History  Adopted: Yes  Problem Relation Age of Onset   Hypertension Mother      SOCIAL HISTORY:   Social History   Socioeconomic History   Marital status: Divorced    Spouse name: Not on file   Number of children: Not on file   Years of education: Not on file   Highest education level: Not on file  Occupational History   Not on file  Tobacco Use   Smoking status: Former    Packs/day: 0.25    Years: 20.00    Total pack years: 5.00    Types: Cigarettes    Quit date: 07/15/2020    Years since quitting: 1.2   Smokeless tobacco: Never  Vaping Use   Vaping Use: Never used  Substance and Sexual Activity   Alcohol use: Not Currently    Comment: occ   Drug use: No   Sexual activity: Not on file  Other Topics Concern   Not on file  Social History Narrative   Not on file   Social Determinants of Health   Financial Resource Strain: Not on file  Food Insecurity: Not on file  Transportation Needs: Not on file  Physical Activity: Not on file  Stress: Not on file  Social Connections: Not on file  Intimate Partner Violence: Not on file    ALLERGIES:    No Known Allergies  CURRENT MEDICATIONS:    Current Facility-Administered Medications  Medication Dose Route Frequency Provider Last Rate Last Admin   acetaminophen (TYLENOL) tablet 650 mg  650 mg Oral Q6H PRN George Hugh,  MD       Or   acetaminophen (TYLENOL) suppository 650 mg  650 mg Rectal Q6H PRN George Hugh, MD       oxyCODONE (Oxy IR/ROXICODONE) immediate release tablet 10 mg  10 mg Oral Q6H PRN George Hugh, MD       And   acetaminophen (TYLENOL) tablet 325 mg  325 mg Oral Q6H PRN George Hugh, MD       albuterol (PROVENTIL) (2.5 MG/3ML) 0.083% nebulizer solution 2.5 mg  2.5 mg Nebulization Q6H PRN George Hugh, MD       Derrill Memo ON 10/23/2021] atorvastatin (LIPITOR) tablet 80 mg  80 mg Oral Daily George Hugh, MD       budesonide (PULMICORT) nebulizer solution 0.5 mg  0.5 mg Nebulization BID George Hugh, MD       heparin ADULT infusion 100 units/mL (25000 units/285m)  1,100  Units/hr Intravenous Continuous BLorna Dibble RPH 11 mL/hr at 10/22/21 1920 1,100 Units/hr at 10/22/21 1920   HYDROmorphone (DILAUDID) injection 0.5 mg  0.5 mg Intravenous Q2H PRN RMerlyn Lot MD   0.5 mg at 10/22/21 2138   ipratropium-albuterol (DUONEB) 0.5-2.5 (3) MG/3ML nebulizer solution 3 mL  3 mL Nebulization Q6H MGeorge Hugh MD       lactated ringers infusion   Intravenous Continuous MGeorge Hugh MD       morphine (PF) 4 MG/ML injection 4 mg  4 mg Intravenous Q3H PRN RMerlyn Lot MD   4 mg at 10/22/21 1616   nicotine (NICODERM CQ - dosed in mg/24 hours) patch 21 mg  21 mg Transdermal Daily MGeorge Hugh MD   21 mg at 10/22/21 2150   ondansetron (ZOFRAN) tablet 4 mg  4 mg Oral Q6H PRN MGeorge Hugh MD       Or   ondansetron ([facility] injection 4 mg  4 mg Intravenous Q6H PRN MGeorge Hugh MD   4 mg at 10/22/21 2138   piperacillin-tazobactam (ZOSYN) IVPB 3.375 g  3.375 g Intravenous Q8H MGeorge Hugh MD        REVIEW OF SYSTEMS:   '[X]'$  denotes positive finding, '[ ]'$  denotes negative finding Cardiac  Comments:  Chest pain or chest pressure:    Shortness of breath upon exertion:    Short of breath when lying flat:    Irregular heart rhythm:        Vascular    Pain in calf, thigh, or hip brought on by ambulation: x   Pain in feet at night that wakes you up from your sleep:     Blood clot in your veins:    Leg swelling:         Pulmonary    Oxygen at home:    Productive cough:     Wheezing:         Neurologic    Sudden weakness in arms or legs:     Sudden numbness in arms or legs:     Sudden onset of difficulty speaking or slurred speech:    Temporary loss of vision in one eye:     Problems with dizziness:         Gastrointestinal    Blood in stool:      Vomited blood:         Genitourinary    Burning when urinating:     Blood in urine:        Psychiatric    Major depression:         Hematologic    Bleeding  problems:    Problems with  blood clotting too easily:        Skin    Rashes or ulcers:        Constitutional    Fever or chills:     PHYSICAL EXAM:   Vitals:   10/22/21 1900 10/22/21 1930 10/22/21 2030 10/22/21 2136  BP: 133/65 (!) 166/98 (!) 143/70 (!) 189/101  Pulse: 66 61 66 (!) 58  Resp: '18 15 17 18  '$ Temp:   98.9 F (37.2 C) 97.6 F (36.4 C)  TempSrc:   Oral   SpO2: 95% 99% 96% 97%  Weight:      Height:        GENERAL: The patient is a well-nourished female, in no acute distress. The vital signs are documented above. CARDIAC: There is a regular rate and rhythm.  VASCULAR: Palpable femoral pulses bilaterally PULMONARY: Nonlabored respirations ABDOMEN: Soft with no tenderness to palpation. MUSCULOSKELETAL: There are no major deformities or cyanosis. NEUROLOGIC: No focal weakness or paresthesias are detected. SKIN: There are no ulcers or rashes noted. PSYCHIATRIC: The patient has a normal affect.  STUDIES:   I have reviewed her CT scan with the following findings:  1. 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 2. Severe atherosclerotic plaque with marked noncalcified atheromatous plaque of the thoracic and abdominal aorta leading to diffuse stenosis with the infrarenal abdominal aorta down to a caliber of 0.7 x 1.2 cm. 3. 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 4. Thin chronic thrombus string like thrombus within the origin of the superior mesenteric artery and left subclavian artery. 5. No central or proximal segmental pulmonary embolus. 6. No intrathoracic, intra-abdominal, intrapelvic abnormality findings to suggest infarction/ischemia. 7. Query cirrhotic morphology of the liver. 8. Aortic Atherosclerosis (ICD10-I70.0) and Emphysema (ICD10-J43.9).    ASSESSMENT and PLAN   Abdominal pain: The patient reports having upper abdominal and  back discomfort for approximately 2 months but it got worse today.  Her CT scan shows increased mural thrombus within the thoracic and abdominal aorta, causing celiac artery occlusion.  These were not seen 2 years ago.  Unfortunately, due to financial constraints, she stopped taking her anticoagulation 2 months ago.  Hematology at [facility] had recommended lifelong anticoagulation.  I suspect that her abdominal pain is related to her celiac artery thrombosis.  Fortunately on CT scan it appears that she has collateralized.  I would start her on heparin and see if she gets any relief, as thrombectomy will be challenging and puts her at risk for embolic sequela I from the procedure.  I stressed to her the importance of never stopping her anticoagulation and if she has financial issues to contact us so that we can avoid lapses in her medication  The patient reports being under significant stress recently, given the loss of her mother.  I would recommend admitting her to the hospital and work-up other possible etiologies of abdominal pain, including ulcerative disease.  I am starting her on Protonix.  We will follow her while she is in the hospital and make future plans based on how she does with anticoagulation and medical management.   Right leg pain: I suspect she has outflow or runoff disease, causing her claudication, likely secondary to embolic disease.  She would benefit from ABIs for baseline.  I stressed the importance of abstinence from tobacco.   Annamarie Major, IV, MD, FACS [facility] [phone] Pager [phone]

## 2021-10-23 DIAGNOSIS — K559 Vascular disorder of intestine, unspecified: Secondary | ICD-10-CM | POA: Diagnosis not present

## 2021-10-23 DIAGNOSIS — I739 Peripheral vascular disease, unspecified: Secondary | ICD-10-CM

## 2021-10-23 DIAGNOSIS — D6859 Other primary thrombophilia: Secondary | ICD-10-CM

## 2021-10-23 DIAGNOSIS — F1010 Alcohol abuse, uncomplicated: Secondary | ICD-10-CM

## 2021-10-23 DIAGNOSIS — I10 Essential (primary) hypertension: Secondary | ICD-10-CM

## 2021-10-23 DIAGNOSIS — Z720 Tobacco use: Secondary | ICD-10-CM

## 2021-10-23 DIAGNOSIS — K219 Gastro-esophageal reflux disease without esophagitis: Secondary | ICD-10-CM

## 2021-10-23 DIAGNOSIS — R109 Unspecified abdominal pain: Secondary | ICD-10-CM

## 2021-10-23 DIAGNOSIS — J449 Chronic obstructive pulmonary disease, unspecified: Secondary | ICD-10-CM

## 2021-10-23 DIAGNOSIS — M79604 Pain in right leg: Secondary | ICD-10-CM

## 2021-10-23 DIAGNOSIS — G8929 Other chronic pain: Secondary | ICD-10-CM

## 2021-10-23 LAB — CBC
HCT: 32.4 % — ABNORMAL LOW (ref 36.0–46.0)
Hemoglobin: 9.9 g/dL — ABNORMAL LOW (ref 12.0–15.0)
MCH: 23 pg — ABNORMAL LOW (ref 26.0–34.0)
MCHC: 30.6 g/dL (ref 30.0–36.0)
MCV: 75.2 fL — ABNORMAL LOW (ref 80.0–100.0)
Platelets: 718 10*3/uL — ABNORMAL HIGH (ref 150–400)
RBC: 4.31 MIL/uL (ref 3.87–5.11)
RDW: 18.5 % — ABNORMAL HIGH (ref 11.5–15.5)
WBC: 16 10*3/uL — ABNORMAL HIGH (ref 4.0–10.5)
nRBC: 0 % (ref 0.0–0.2)

## 2021-10-23 LAB — APTT: aPTT: 55 seconds — ABNORMAL HIGH (ref 24–36)

## 2021-10-23 LAB — BASIC METABOLIC PANEL
Anion gap: 9 (ref 5–15)
BUN: 7 mg/dL (ref 6–20)
CO2: 23 mmol/L (ref 22–32)
Calcium: 8.8 mg/dL — ABNORMAL LOW (ref 8.9–10.3)
Chloride: 107 mmol/L (ref 98–111)
Creatinine, Ser: 0.79 mg/dL (ref 0.44–1.00)
GFR, Estimated: >60 mL/min (ref >60)
Glucose, Bld: 119 mg/dL — ABNORMAL HIGH (ref 70–99)
Potassium: 3.6 mmol/L (ref 3.5–5.1)
Sodium: 139 mmol/L (ref 135–145)

## 2021-10-23 LAB — HIV ANTIBODY (ROUTINE TESTING W REFLEX): HIV Screen 4th Generation wRfx: NONREACTIVE

## 2021-10-23 LAB — HEPARIN LEVEL (UNFRACTIONATED)
Heparin Unfractionated: <0.1 IU/mL — ABNORMAL LOW (ref 0.30–0.70)
Heparin Unfractionated: 0.33 IU/mL (ref 0.30–0.70)
Heparin Unfractionated: 0.22 IU/mL — ABNORMAL LOW (ref 0.30–0.70)
Heparin Unfractionated: 0.45 IU/mL (ref 0.30–0.70)

## 2021-10-23 MED ORDER — HYDRALAZINE HCL 20 MG/ML IJ SOLN
5.0000 mg | Freq: Once | INTRAMUSCULAR | Status: AC
  Administered 2021-10-23: 5 mg via INTRAVENOUS
  Filled 2021-10-23: qty 1

## 2021-10-23 MED ORDER — HYDRALAZINE HCL 20 MG/ML IJ SOLN
10.0000 mg | Freq: Four times a day (QID) | INTRAMUSCULAR | Status: DC | PRN
  Administered 2021-10-23 – 2021-10-28 (×2): 10 mg via INTRAVENOUS
  Filled 2021-10-23 (×2): qty 1

## 2021-10-23 MED ORDER — LORAZEPAM 2 MG/ML IJ SOLN
1.0000 mg | INTRAMUSCULAR | Status: AC | PRN
  Administered 2021-10-23 (×3): 2 mg via INTRAVENOUS
  Filled 2021-10-23 (×3): qty 1

## 2021-10-23 MED ORDER — THIAMINE HCL 100 MG PO TABS
100.0000 mg | ORAL_TABLET | Freq: Every day | ORAL | Status: DC
Start: 2021-10-23 — End: 2021-10-30
  Administered 2021-10-23 – 2021-10-29 (×5): 100 mg via ORAL
  Filled 2021-10-23 (×6): qty 1

## 2021-10-23 MED ORDER — CEFAZOLIN SODIUM-DEXTROSE 2-4 GM/100ML-% IV SOLN: 2.0000 g | INTRAVENOUS | Status: DC

## 2021-10-23 MED ORDER — ADULT MULTIVITAMIN W/MINERALS CH
1.0000 | ORAL_TABLET | Freq: Every day | ORAL | Status: DC
Start: 2021-10-23 — End: 2021-10-30
  Administered 2021-10-23 – 2021-10-30 (×7): 1 via ORAL
  Filled 2021-10-23 (×7): qty 1

## 2021-10-23 MED ORDER — HEPARIN BOLUS VIA INFUSION
950.0000 [IU] | Freq: Once | INTRAVENOUS | Status: AC
  Administered 2021-10-23: 950 [IU] via INTRAVENOUS
  Filled 2021-10-23: qty 950

## 2021-10-23 MED ORDER — FOLIC ACID 1 MG PO TABS
1.0000 mg | ORAL_TABLET | Freq: Every day | ORAL | Status: DC
Start: 2021-10-23 — End: 2021-10-30
  Administered 2021-10-23 – 2021-10-30 (×7): 1 mg via ORAL
  Filled 2021-10-23 (×7): qty 1

## 2021-10-23 MED ORDER — THIAMINE HCL 100 MG/ML IJ SOLN
100.0000 mg | Freq: Every day | INTRAMUSCULAR | Status: DC
  Administered 2021-10-24 – 2021-10-30 (×3): 100 mg via INTRAVENOUS
  Filled 2021-10-23 (×4): qty 2

## 2021-10-23 MED ORDER — LORAZEPAM 1 MG PO TABS: 1.0000 mg | ORAL_TABLET | ORAL | Status: AC | PRN

## 2021-10-23 NOTE — Assessment & Plan Note (Signed)
protonix BID

## 2021-10-23 NOTE — Assessment & Plan Note (Signed)
Hx CRPS

## 2021-10-23 NOTE — Assessment & Plan Note (Signed)
Encourage cessation. °

## 2021-10-23 NOTE — Assessment & Plan Note (Signed)
BP not controlled Will add prn for now - likely component of pain, will work on pain

## 2021-10-23 NOTE — Plan of Care (Signed)
Pt AAOx4, reports severe abdominal pain. VS are stable. Plan for heparin and pain control. Bed is in lowest position, call light within reach. Will continue to monitor.

## 2021-10-23 NOTE — Progress Notes (Signed)
       CROSS COVER NOTE  NAME: Suzanne [name] MRN: [id number] DOB : 1975-12-04    Date of Service   10/23/21  HPI/Events of Note   Patient endorses daily alcohol use. She reports she drinks a couple of beers daily and husband states she drinks as much as a whole pack of beers daily.  Interventions   Plan: CIWA/Ativan protocol        This document was prepared using Dragon voice recognition software and may include unintentional dictation errors.  Neomia Glass DNP, MHA, FNP-BC Nurse Practitioner [facility] Pager [phone]

## 2021-10-23 NOTE — Consult Note (Signed)
ANTICOAGULATION CONSULT NOTE - Follow Up Consult  Pharmacy Consult for Heparin gtt Indication:  aortic thrombus/MI  No Known Allergies  Patient Measurements: Height: '5\' 7"'$  (170.2 cm) Weight: 61.2 kg (135 lb) IBW/kg (Calculated) : 61.6 Heparin Dosing Weight: 61.2kg  Vital Signs: Temp: 97.7 F (36.5 C) (07/05 1615) Temp Source: Oral (07/05 1615) BP: 180/92 (07/05 1700) Pulse Rate: 85 (07/05 1700)  Labs: Recent Labs    10/22/21 1417 10/22/21 1823 10/22/21 1823 10/23/21 0108 10/23/21 0702 10/23/21 1714  HGB 10.6*  --   --  9.9*  --   --   HCT 34.6*  --   --  32.4*  --   --   PLT 784*  --   --  718*  --   --   APTT  --  30  --  55*  --   --   LABPROT  --  14.0  --   --   --   --   INR  --  1.1  --   --   --   --   HEPARINUNFRC  --  <0.10*   < > 0.33 0.22* 0.45  CREATININE 0.82  --   --  0.79  --   --   TROPONINIHS 7 9  --   --   --   --    < > = values in this interval not displayed.   Heparin Dosing Weight: 61.2kg  Estimated Creatinine Clearance: 86.7 mL/min (by C-G formula based on SCr of 0.79 mg/dL).  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 Pharmacy consulted for mgmt of heparin gtt.  Date Time aPTT/HL Rate/Comment 7/5 0702 HL 0.22 SUBtherapeutic @ 1100 units/hr     7/5 1714 HL 0.45 Therapeutic x1 @ 1250 units/hr  Baseline Labs: aPTT - pending INR - pending HL - pending Hgb - 10.6 Plts - 784  Goal of Therapy:  Heparin level 0.3-0.7 units/ml aPTT 66-102 seconds Monitor platelets by anticoagulation protocol: Yes   Plan:  Therapeutic x 1 Continue heparin infusion at 1250 units/hr Check confirmatory HL 6 hours  CBC  daily   WPearla Dubonnet PharmD Clinical Pharmacist 10/23/2021 7:31 PM

## 2021-10-23 NOTE — Assessment & Plan Note (Addendum)
CT abd/pelvis with severe atherosclerotic plaque with marked noncalcified atheromatous plaque of the thoracic and abdominal aorta leading to diffuse stenosis with the infrarenal abdominal aortal down to Verley Pariseau caliber of 0.7x1.2 cm.  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  Thin chronic thrombus string like thrombus within the origin of the superior mesenteric artery and left subclavian artery.   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

## 2021-10-23 NOTE — Hospital Course (Addendum)
Suzanne [name] is a 46 y.o. female with PMH of HTN, PAD, active smoker, hypercoagulability-splenic infarct, aortic thrombus, right upper extremity acute limb ischemia failed multiple revascularization attempts ultimately requiring right transfer radial amputation, COPD.  7/4 presented to ER with abdominal pain.  Associated with melena.  Vascular surgery was consulted for mesenteric ischemia concerns. 7/6 underwent's abdominal aortogram, percutaneous angioplasty of celiac artery.  Has patent SMA after multiple difficult times, celiac artery was engaged.  There is also significant disease in gastroduodenal artery.  As well as hepatic and splenic arteries. For vascular surgery rest of the disease process is not reconstructable using interventional technique. Patient is not a good candidate for surgical reconstruction due to her extensive distal disease. Postoperatively patient had some abdominal pain and a CT abdomen pelvis was performed which showed hepatic artery infarction as well as a calculus cholecystitis. 7/7 General surgery consulted.  Recommend conservative measures due to extensive risk for clot progression going to cholecystectomy.  IR was consulted for percutaneous cholecystostomy.  Patient was started on IV Zosyn 7/8 IR placed percutaneous cholecystostomy drain.  IR also placed left PICC line. Diet advanced to clear liquid diet. 7/9 diet advanced to full liquid diet.  Leukocytosis improving.  Still on heparin.

## 2021-10-23 NOTE — Assessment & Plan Note (Signed)
Awaiting ABI

## 2021-10-23 NOTE — Consult Note (Addendum)
ANTICOAGULATION CONSULT NOTE - Follow Up Consult  Pharmacy Consult for Heparin gtt Indication:  aortic thrombus/MI  No Known Allergies  Patient Measurements: Height: '5\' 7"'$  (170.2 cm) Weight: 61.2 kg (135 lb) IBW/kg (Calculated) : 61.6 Heparin Dosing Weight: 61.2kg  Vital Signs: Temp: 98.5 F (36.9 C) (07/05 0800) Temp Source: Oral (07/05 0800) BP: 196/91 (07/05 0800) Pulse Rate: 88 (07/05 0800)  Labs: Recent Labs    10/22/21 1417 10/22/21 1823 10/23/21 0108 10/23/21 0702  HGB 10.6*  --  9.9*  --   HCT 34.6*  --  32.4*  --   PLT 784*  --  718*  --   APTT  --  30 55*  --   LABPROT  --  14.0  --   --   INR  --  1.1  --   --   HEPARINUNFRC  --  <0.10* 0.33 0.22*  CREATININE 0.82  --  0.79  --   TROPONINIHS 7 9  --   --    Heparin Dosing Weight: 61.2kg  Estimated Creatinine Clearance: 86.7 mL/min (by C-G formula based on SCr of 0.79 mg/dL).  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 Pharmacy consulted for mgmt of heparin gtt.  Date Time aPTT/HL Rate/Comment 7/5 0702 HL 0.22 SUBtherapeutic @ 1100 units/hr      Baseline Labs: aPTT - pending INR - pending HL - pending Hgb - 10.6 Plts - 784  Goal of Therapy:  Heparin level 0.3-0.7 units/ml aPTT 66-102 seconds Monitor platelets by anticoagulation protocol: Yes   Plan:  Heparin Subtherapeutic  Heparin 950 unit bolus x 1 Increase heparin infusion to 1250 units/hr Recheck HL 6 hours following rate change CBC daily   CDorothe Pea PharmD, BCPS Clinical Pharmacist   10/23/2021,10:01 AM

## 2021-10-23 NOTE — Consult Note (Signed)
ANTICOAGULATION CONSULT NOTE - Follow Up Consult  Pharmacy Consult for Heparin gtt Indication:  aortic thrombus/MI  No Known Allergies  Patient Measurements: Height: '5\' 7"'$  (170.2 cm) Weight: 61.2 kg (135 lb) IBW/kg (Calculated) : 61.6 Heparin Dosing Weight: 61.2kg  Vital Signs: Temp: 97.6 F (36.4 C) (07/04 2136) Temp Source: Oral (07/04 2030) BP: 189/101 (07/04 2136) Pulse Rate: 58 (07/04 2136)  Labs: Recent Labs    10/22/21 1417 10/22/21 1823 10/23/21 0108  HGB 10.6*  --  9.9*  HCT 34.6*  --  32.4*  PLT 784*  --  718*  APTT  --  30 55*  LABPROT  --  14.0  --   INR  --  1.1  --   HEPARINUNFRC  --   --  0.33  CREATININE 0.82  --  0.79  TROPONINIHS 7 9  --    Heparin Dosing Weight: 61.2kg  Estimated Creatinine Clearance: 86.7 mL/min (by C-G formula based on SCr of 0.79 mg/dL).  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 Pharmacy consulted for mgmt of heparin gtt.  Date Time aPTT/HL Rate/Comment       Baseline Labs: aPTT - pending INR - pending HL - pending Hgb - 10.6 Plts - 784  Goal of Therapy:  Heparin level 0.3-0.7 units/ml aPTT 66-102 seconds Monitor platelets by anticoagulation protocol: Yes   Plan:  7/5:  HL @ 0108 = 0.33, therapeutic X 1 Will continue pt on current rate and draw confirmation level on 7/5 @ 0700.   Tenesia Escudero D 10/23/2021,1:48 AM

## 2021-10-23 NOTE — Assessment & Plan Note (Signed)
GI c/s to r/o ulcerative disease Thought due to above, vascular rec r/o ulcerative disease Will continue to w/u as indicated

## 2021-10-23 NOTE — Assessment & Plan Note (Signed)
Awaiting ABI's for LE

## 2021-10-23 NOTE — Assessment & Plan Note (Signed)
CIWA 

## 2021-10-23 NOTE — Assessment & Plan Note (Addendum)
Not clear the etiology of her hypercoagulable state Looks like she saw heme at [facility] 10/02/2020, they recommended continuing eliquis at that time Would probably benefit from hematology c/s

## 2021-10-23 NOTE — Progress Notes (Signed)
PROGRESS NOTE    [name]  ZOX:[id number] DOB: May 23, 1975 DOA: 10/22/2021 PCP: System, Provider Not In  Chief Complaint  Patient presents with   Abdominal Pain    Brief Narrative:  [name] is Arneda [name] 46 y.o. female with medical history significant of hyperlipidemia, liver cirrhosis, COPD, multiple DVTs, (non-adherent to Eliquis), severe peripheral vascular disease with right upper extremity ischemia s/p 08/17/20 angioplasty (followed by Dr. Eulogio Ditch) who presented to the ED with severe fluctuating abdominal pain with tenderness to palpation across the entire upper abdomen that started [name] few weeks ago but has been getting worse over the last few days.  The abdominal pain is associated with dark bloody bowel movements.  She also endorses post-prandial pain over the last few weeks.  Blood pressure and heart rate are stable but become elevated when patient is in pain and anxious.  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  [name] is very anxious and requests extra pain medications throughout the night.  She denies any fevers, chills, chest pain, shortness of breath, or urinary symptoms.  Currently she's on heparin gtt.  Vascular following.      Assessment & Plan:   Principal Problem:   Mesenteric ischemia (HCC) Active Problems:   Abdominal pain   PVD (peripheral vascular disease) (HCC)   Hypercoagulable state (HCC)   Leg pain, right   GERD (gastroesophageal reflux disease)   Alcohol abuse   Chronic pain   COPD (chronic obstructive pulmonary disease) (HCC)   Tobacco use   Assessment and Plan: * Mesenteric ischemia (HCC) CT abd/pelvis with severe atherosclerotic plaque with marked noncalcified atheromatous plaque of the thoracic and abdominal aorta leading to diffuse stenosis with the infrarenal abdominal aortal  down to Argenis Kumari caliber of 0.7x1.2 cm.  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  Thin chronic thrombus string like thrombus within the origin of the superior mesenteric artery and left subclavian artery.   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 Recommended heparin gtt, thrombectomy would be challenge and place her at risk for embolic sequelae She apparently stopped anticoagulation 2 months ago - extremely important that she continue anticoagulation uninterrupted Continue heparin gtt zosyn   Abdominal pain GI c/s to r/o ulcerative disease Thought due to above, vascular rec r/o ulcerative disease Will continue to w/u as indicated  Leg pain, right Awaiting ABI  Hypercoagulable state ([place]) Not clear the etiology of her hypercoagulable state Looks like she saw heme at [facility] 10/02/2020, they recommended continuing eliquis at that time Would probably benefit from hematology c/s  PVD (peripheral vascular disease) ([place]) Awaiting ABI's for LE  Alcohol abuse CIWA  GERD (gastroesophageal reflux disease) protonix BID  Chronic pain Hx CRPS   COPD (chronic obstructive pulmonary disease) ([place]) No evidence exacerbation  Tobacco use Encourage cessation     DVT prophylaxis: heparin gtt Code Status: full Family Communication: none Disposition:   Status is: Inpatient Remains inpatient appropriate because: need for continued vascular eval, pain management   Consultants:  vascular  Procedures:  none  Antimicrobials:  Anti-infectives (From admission, onward)    Start     Dose/Rate Route Frequency Ordered Stop   10/24/21 0600  ceFAZolin (ANCEF) IVPB 2g/100 mL  premix        2 g 200 mL/hr over 30 Minutes Intravenous On call to O.R. 10/23/21 1548 10/25/21 0559    10/22/21 2200  piperacillin-tazobactam (ZOSYN) IVPB 3.375 g        3.375 g 12.5 mL/hr over 240 Minutes Intravenous Every 8 hours 10/22/21 2044         Subjective: C//o pain, but falls alseep  Objective: Vitals:   10/23/21 0100 10/23/21 0452 10/23/21 0800 10/23/21 1615  BP: (!) 199/90 (!) 198/92 (!) 196/91 (!) 194/101  Pulse: (!) 55 78 88 81  Resp:  '20 18 20  '$ Temp: 98.5 F (36.9 C) 97.8 F (36.6 C) 98.5 F (36.9 C) 97.7 F (36.5 C)  TempSrc: Oral Oral Oral Oral  SpO2:  96% 96% 99%  Weight:      Height:        Intake/Output Summary (Last 24 hours) at 10/23/2021 1802 Last data filed at 10/23/2021 1522 Gross per 24 hour  Intake 863.92 ml  Output --  Net 863.92 ml   Filed Weights   10/22/21 1407  Weight: 61.2 kg    Examination:  General exam: c/o pain, but falls asleep Respiratory system: unlabored Cardiovascular system: RRR Gastrointestinal system: diffuse abdominal TTP Central nervous system: drowsy, answers appropriately, then falls asleep Extremities: no LEE   Data Reviewed: I have personally reviewed following labs and imaging studies  CBC: Recent Labs  Lab 10/22/21 1417 10/23/21 0108  WBC 14.7* 16.0*  HGB 10.6* 9.9*  HCT 34.6* 32.4*  MCV 74.6* 75.2*  PLT 784* 718*    Basic Metabolic Panel: Recent Labs  Lab 10/22/21 1417 10/23/21 0108  NA 135 139  K 3.8 3.6  CL 103 107  CO2 21* 23  GLUCOSE 121* 119*  BUN 6 7  CREATININE 0.82 0.79  CALCIUM 9.1 8.8*    GFR: Estimated Creatinine Clearance: 86.7 mL/min (by C-G formula based on SCr of 0.79 mg/dL).  Liver Function Tests: Recent Labs  Lab 10/22/21 1409  AST 14*  ALT 9  ALKPHOS 83  BILITOT 0.5  PROT 7.7  ALBUMIN 3.2*    CBG: No results for input(s): "GLUCAP" in the last 168 hours.   No results found for this or any previous visit (from the past 240 hour(s)).       Radiology Studies: CT Angio Chest/Abd/Pel for Dissection W and/or Wo Contrast  Addendum Date: 10/22/2021    ADDENDUM REPORT: 10/22/2021 18:07 ADDENDUM: These results were called by telephone at the time of interpretation on 10/22/2021 at 6:07 pm to provider Merlyn Lot , who verbally acknowledged these results. Electronically Signed   By: Iven Finn M.D.   On: 10/22/2021 18:07   Result Date: 10/22/2021 CLINICAL DATA:  Aortic atherosclerosis. Pt via POV from home. Pt c/o epigastric pain that radiates to her back, SOB, headaches, and emesis that has been going on the past month, states today it is the worse it has ever been. States that she has been without her Eliquis EXAM: CT ANGIOGRAPHY CHEST, ABDOMEN AND PELVIS TECHNIQUE: Non-contrast CT of the chest was initially obtained. Multidetector CT imaging through the chest, abdomen and pelvis was performed using the standard protocol during bolus administration of intravenous contrast. Multiplanar reconstructed images and MIPs were obtained and reviewed to evaluate the vascular anatomy. RADIATION DOSE REDUCTION: This exam was performed according to the departmental dose-optimization program which includes automated exposure control, adjustment of the mA and/or kV according to patient size and/or use of iterative reconstruction technique. CONTRAST:  73m OMNIPAQUE IOHEXOL 350 MG/ML SOLN COMPARISON:  CT angiography chest, abdomen, pelvis 03/10/2020 FINDINGS: CTA CHEST FINDINGS Cardiovascular: Preferential opacification of the thoracic aorta. No evidence of thoracic aortic aneurysm or dissection. Severe atherosclerotic plaque with mid to distal descending thoracic aorta noncalcified atheromatous plaque. Thin central filling defect within the origin of the left subclavian artery likely chronic web or thrombus. No coronary artery calcifications. Normal heart size. No significant pericardial effusion. The main pulmonary artery is normal in caliber. No central or proximal segmental pulmonary embolus. Mediastinum/Nodes: No enlarged mediastinal, hilar, or axillary lymph  nodes. Thyroid gland, trachea, and esophagus demonstrate no significant findings. Lungs/Pleura: Mild paraseptal emphysematous changes peer no focal consolidation. No pulmonary nodule. No pulmonary mass. No pleural effusion. No pneumothorax. Musculoskeletal: No chest wall abnormality. No suspicious lytic or blastic osseous lesions. No acute displaced fracture. Review of the MIP images confirms the above findings. CTA ABDOMEN AND PELVIS FINDINGS VASCULAR Aorta: Severe atherosclerotic plaque with marked noncalcified atheromatous plaque leading to diffuse stenosis of the abdominal aorta with the infrarenal abdominal aorta down to Jakylan Ron caliber of 0.7 x 1.2 cm. Normal caliber aorta without aneurysm, dissection, vasculitis. 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 SMA: Thin chronic thrombus string like thrombus within the origin of the superior mesenteric artery (6:140). Patent without evidence of aneurysm, dissection, vasculitis or significant stenosis. Renals: Both renal arteries are patent without evidence of aneurysm, dissection, vasculitis, fibromuscular dysplasia or significant stenosis. 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 Inflow: Patent without evidence of aneurysm, dissection, vasculitis or significant stenosis. Veins: No obvious venous abnormality within the limitations of this arterial phase study. Review of the MIP images confirms the above findings. NON-VASCULAR Hepatobiliary: Query trace nodular hepatic contour. Liver measures at the upper limits of normal. No focal liver abnormality. No gallstones, gallbladder wall thickening, or pericholecystic fluid. No biliary dilatation. Pancreas: No focal lesion. Normal pancreatic contour. No surrounding inflammatory changes. No main pancreatic ductal dilatation. Spleen: Normal in size without focal abnormality.  Adrenals/Urinary Tract: No adrenal nodule bilaterally. Bilateral kidneys enhance symmetrically. Bilateral renal cortical scarring. Subcentimeter hypodensities too small to characterize. No definite wedge-shaped peripheral hypodensities suggest infarction. No hydronephrosis. No hydroureter. The urinary bladder is unremarkable. Stomach/Bowel: Stomach is within normal limits. No evidence of bowel wall thickening or dilatation. No pneumatosis. Appendix appears normal. Lymphatic: No lymphadenopathy. Reproductive: Uterus and bilateral adnexa are unremarkable. Other: No intraperitoneal free fluid. No intraperitoneal free gas. No organized fluid collection. Musculoskeletal: No abdominal wall hernia or abnormality. No suspicious lytic or blastic osseous lesions. No acute displaced fracture. Please moderate degenerative changes of the hips. Review of the MIP images confirms the above findings. IMPRESSION: 1. 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 2. Severe atherosclerotic plaque with marked noncalcified atheromatous plaque of the thoracic and abdominal aorta leading to diffuse stenosis with the infrarenal abdominal aorta down to Ayianna Darnold caliber of 0.7 x 1.2 cm. 3. 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 4. Thin chronic thrombus string like thrombus within the origin of the superior mesenteric artery and left subclavian artery. 5. No central or proximal segmental pulmonary embolus. 6. No intrathoracic, intra-abdominal, intrapelvic abnormality findings to suggest infarction/ischemia. 7. Query cirrhotic morphology of the liver. 8. Aortic Atherosclerosis (ICD10-I70.0) and Emphysema (ICD10-J43.9). Electronically Signed: By: MIven FinnM.D. On: 10/22/2021 18:04  DG Chest 2 View  Result Date: 10/22/2021 CLINICAL DATA:  Acute shortness of breath. EXAM: CHEST - 2 VIEW COMPARISON:   11/20/2016 radiograph FINDINGS: The cardiomediastinal silhouette is unremarkable. There is no evidence of focal airspace disease, pulmonary edema, suspicious pulmonary nodule/mass, pleural effusion, or pneumothorax. No acute bony abnormalities are identified. IMPRESSION: No active cardiopulmonary disease. Electronically Signed   By: Margarette Canada M.D.   On: 10/22/2021 14:45        Scheduled Meds:  atorvastatin  80 mg Oral Daily   budesonide (PULMICORT) nebulizer solution  0.5 mg Nebulization BID   folic acid  1 mg Oral Daily   multivitamin with minerals  1 tablet Oral Daily   nicotine  21 mg Transdermal Daily   pantoprazole (PROTONIX) IV  40 mg Intravenous Q12H   thiamine  100 mg Oral Daily   Or   thiamine  100 mg Intravenous Daily   Continuous Infusions:  [START ON 10/24/2021]  ceFAZolin (ANCEF) IV     heparin 1,250 Units/hr (10/23/21 1522)   lactated ringers 75 mL/hr at 10/23/21 1522   piperacillin-tazobactam (ZOSYN)  IV 3.375 g (10/23/21 1544)     LOS: 1 day    Time spent: over 30 min    Fayrene Helper, MD [facility]   To contact the attending provider between 7A-7P or the covering provider during after hours 7P-7A, please log into the web site [email or web address] and access using universal [place] password for that web site. If you do not have the password, please call the hospital operator.  10/23/2021, 6:02 PM

## 2021-10-23 NOTE — Assessment & Plan Note (Signed)
No evidence exacerbation

## 2021-10-24 ENCOUNTER — Inpatient Hospital Stay: Payer: Medicaid Other

## 2021-10-24 ENCOUNTER — Encounter
Admission: EM | Disposition: A | Discharge status: Home / Self Care | Payer: Self-pay | Source: Home / Self Care | Attending: Internal Medicine

## 2021-10-24 DIAGNOSIS — I7409 Other arterial embolism and thrombosis of abdominal aorta: Secondary | ICD-10-CM | POA: Diagnosis not present

## 2021-10-24 DIAGNOSIS — R1084 Generalized abdominal pain: Secondary | ICD-10-CM

## 2021-10-24 DIAGNOSIS — F1010 Alcohol abuse, uncomplicated: Secondary | ICD-10-CM

## 2021-10-24 DIAGNOSIS — G8929 Other chronic pain: Secondary | ICD-10-CM

## 2021-10-24 DIAGNOSIS — J449 Chronic obstructive pulmonary disease, unspecified: Secondary | ICD-10-CM

## 2021-10-24 DIAGNOSIS — K559 Vascular disorder of intestine, unspecified: Secondary | ICD-10-CM | POA: Diagnosis not present

## 2021-10-24 DIAGNOSIS — E876 Hypokalemia: Secondary | ICD-10-CM

## 2021-10-24 DIAGNOSIS — K219 Gastro-esophageal reflux disease without esophagitis: Secondary | ICD-10-CM

## 2021-10-24 DIAGNOSIS — D6859 Other primary thrombophilia: Secondary | ICD-10-CM

## 2021-10-24 DIAGNOSIS — I774 Celiac artery compression syndrome: Secondary | ICD-10-CM | POA: Diagnosis not present

## 2021-10-24 DIAGNOSIS — M79604 Pain in right leg: Secondary | ICD-10-CM

## 2021-10-24 DIAGNOSIS — I739 Peripheral vascular disease, unspecified: Secondary | ICD-10-CM

## 2021-10-24 DIAGNOSIS — I10 Essential (primary) hypertension: Secondary | ICD-10-CM

## 2021-10-24 DIAGNOSIS — K551 Chronic vascular disorders of intestine: Secondary | ICD-10-CM | POA: Diagnosis not present

## 2021-10-24 DIAGNOSIS — Z720 Tobacco use: Secondary | ICD-10-CM

## 2021-10-24 HISTORY — PX: VISCERAL ARTERY INTERVENTION: CATH118277

## 2021-10-24 LAB — COMPREHENSIVE METABOLIC PANEL
ALT: 17 U/L (ref 0–44)
AST: 28 U/L (ref 15–41)
Albumin: 2.9 g/dL — ABNORMAL LOW (ref 3.5–5.0)
Alkaline Phosphatase: 79 U/L (ref 38–126)
Anion gap: 8 (ref 5–15)
BUN: 8 mg/dL (ref 6–20)
CO2: 24 mmol/L (ref 22–32)
Calcium: 8.7 mg/dL — ABNORMAL LOW (ref 8.9–10.3)
Chloride: 101 mmol/L (ref 98–111)
Creatinine, Ser: 0.91 mg/dL (ref 0.44–1.00)
GFR, Estimated: >60 mL/min (ref >60)
Glucose, Bld: 125 mg/dL — ABNORMAL HIGH (ref 70–99)
Potassium: 3.4 mmol/L — ABNORMAL LOW (ref 3.5–5.1)
Sodium: 133 mmol/L — ABNORMAL LOW (ref 135–145)
Total Bilirubin: 0.6 mg/dL (ref 0.3–1.2)
Total Protein: 7.2 g/dL (ref 6.5–8.1)

## 2021-10-24 LAB — CBC WITH DIFFERENTIAL/PLATELET
Abs Immature Granulocytes: 0.14 10*3/uL — ABNORMAL HIGH (ref 0.00–0.07)
Basophils Absolute: 0.1 10*3/uL (ref 0.0–0.1)
Basophils Relative: 1 %
Eosinophils Absolute: 0.1 10*3/uL (ref 0.0–0.5)
Eosinophils Relative: 0 %
HCT: 31.1 % — ABNORMAL LOW (ref 36.0–46.0)
Hemoglobin: 9.6 g/dL — ABNORMAL LOW (ref 12.0–15.0)
Immature Granulocytes: 1 %
Lymphocytes Relative: 11 %
Lymphs Abs: 2.2 10*3/uL (ref 0.7–4.0)
MCH: 23 pg — ABNORMAL LOW (ref 26.0–34.0)
MCHC: 30.9 g/dL (ref 30.0–36.0)
MCV: 74.4 fL — ABNORMAL LOW (ref 80.0–100.0)
Monocytes Absolute: 2.1 10*3/uL — ABNORMAL HIGH (ref 0.1–1.0)
Monocytes Relative: 10 %
Neutro Abs: 16.6 10*3/uL — ABNORMAL HIGH (ref 1.7–7.7)
Neutrophils Relative %: 77 %
Platelets: 764 10*3/uL — ABNORMAL HIGH (ref 150–400)
RBC: 4.18 MIL/uL (ref 3.87–5.11)
RDW: 18.8 % — ABNORMAL HIGH (ref 11.5–15.5)
WBC: 21.2 10*3/uL — ABNORMAL HIGH (ref 4.0–10.5)
nRBC: 0 % (ref 0.0–0.2)

## 2021-10-24 LAB — HEPARIN LEVEL (UNFRACTIONATED)
Heparin Unfractionated: 0.45 IU/mL (ref 0.30–0.70)
Heparin Unfractionated: 0.35 IU/mL (ref 0.30–0.70)

## 2021-10-24 LAB — MAGNESIUM: Magnesium: 1.9 mg/dL (ref 1.7–2.4)

## 2021-10-24 LAB — PHOSPHORUS: Phosphorus: 3.1 mg/dL (ref 2.5–4.6)

## 2021-10-24 SURGERY — VISCERAL ARTERY INTERVENTION
Anesthesia: Moderate Sedation

## 2021-10-24 MED ORDER — MIDAZOLAM HCL 2 MG/2ML IJ SOLN
INTRAMUSCULAR | Status: DC | PRN
  Administered 2021-10-24 (×2): .5 mg via INTRAVENOUS
  Administered 2021-10-24 (×2): 1 mg via INTRAVENOUS
  Administered 2021-10-24: .5 mg via INTRAVENOUS
  Administered 2021-10-24 (×2): 1 mg via INTRAVENOUS

## 2021-10-24 MED ORDER — HYDROMORPHONE HCL 1 MG/ML IJ SOLN
1.0000 mg | Freq: Once | INTRAMUSCULAR | Status: AC | PRN
  Administered 2021-10-24: 1 mg via INTRAVENOUS
  Filled 2021-10-24: qty 1

## 2021-10-24 MED ORDER — HEPARIN SODIUM (PORCINE) 1000 UNIT/ML IJ SOLN
INTRAMUSCULAR | Status: DC | PRN
  Administered 2021-10-24: 4000 [IU] via INTRAVENOUS

## 2021-10-24 MED ORDER — LABETALOL HCL 5 MG/ML IV SOLN
INTRAVENOUS | Status: AC
  Filled 2021-10-24: qty 4

## 2021-10-24 MED ORDER — HEPARIN (PORCINE) 25000 UT/250ML-% IV SOLN
1500.0000 [IU]/h | INTRAVENOUS | Status: DC
  Administered 2021-10-24: 1250 [IU]/h via INTRAVENOUS
  Administered 2021-10-25: 1400 [IU]/h via INTRAVENOUS
  Administered 2021-10-26 – 2021-10-29 (×6): 1500 [IU]/h via INTRAVENOUS
  Filled 2021-10-24 (×6): qty 250

## 2021-10-24 MED ORDER — FAMOTIDINE 20 MG PO TABS: 40.0000 mg | ORAL_TABLET | Freq: Once | ORAL | Status: DC | PRN

## 2021-10-24 MED ORDER — FENTANYL CITRATE (PF) 100 MCG/2ML IJ SOLN
INTRAMUSCULAR | Status: DC | PRN
  Administered 2021-10-24: 50 ug via INTRAVENOUS
  Administered 2021-10-24 (×3): 25 ug via INTRAVENOUS

## 2021-10-24 MED ORDER — BISACODYL 5 MG PO TBEC
5.0000 mg | DELAYED_RELEASE_TABLET | Freq: Two times a day (BID) | ORAL | Status: AC
  Administered 2021-10-24 – 2021-10-25 (×2): 5 mg via ORAL
  Filled 2021-10-24 (×2): qty 1

## 2021-10-24 MED ORDER — SODIUM CHLORIDE 0.9% FLUSH
3.0000 mL | Freq: Two times a day (BID) | INTRAVENOUS | Status: DC
  Administered 2021-10-25 – 2021-10-30 (×10): 3 mL via INTRAVENOUS

## 2021-10-24 MED ORDER — HEPARIN SODIUM (PORCINE) 1000 UNIT/ML IJ SOLN
INTRAMUSCULAR | Status: AC
  Filled 2021-10-24: qty 10

## 2021-10-24 MED ORDER — HYDROMORPHONE HCL 1 MG/ML IJ SOLN
INTRAMUSCULAR | Status: AC
  Filled 2021-10-24: qty 0.5

## 2021-10-24 MED ORDER — CEFAZOLIN SODIUM-DEXTROSE 2-4 GM/100ML-% IV SOLN: 2.0000 g | INTRAVENOUS | Status: DC

## 2021-10-24 MED ORDER — SODIUM CHLORIDE 0.9 % IV SOLN: 250.0000 mL | INTRAVENOUS | Status: DC | PRN

## 2021-10-24 MED ORDER — CEFAZOLIN SODIUM-DEXTROSE 2-4 GM/100ML-% IV SOLN
INTRAVENOUS | Status: AC
  Filled 2021-10-24: qty 100

## 2021-10-24 MED ORDER — FENTANYL CITRATE PF 50 MCG/ML IJ SOSY
PREFILLED_SYRINGE | INTRAMUSCULAR | Status: AC
  Filled 2021-10-24: qty 1

## 2021-10-24 MED ORDER — SODIUM CHLORIDE 0.9 % IV SOLN: INTRAVENOUS | Status: DC

## 2021-10-24 MED ORDER — MIDAZOLAM HCL 2 MG/ML PO SYRP: 8.0000 mg | ORAL_SOLUTION | Freq: Once | ORAL | Status: AC | PRN

## 2021-10-24 MED ORDER — MIDAZOLAM HCL 2 MG/ML PO SYRP
ORAL_SOLUTION | ORAL | Status: AC
  Administered 2021-10-24: 8 mg via ORAL
  Filled 2021-10-24: qty 4

## 2021-10-24 MED ORDER — SODIUM CHLORIDE 0.9% FLUSH: 3.0000 mL | INTRAVENOUS | Status: DC | PRN

## 2021-10-24 MED ORDER — ONDANSETRON HCL 4 MG/2ML IJ SOLN: 4.0000 mg | Freq: Four times a day (QID) | INTRAMUSCULAR | Status: DC | PRN

## 2021-10-24 MED ORDER — LABETALOL HCL 5 MG/ML IV SOLN
INTRAVENOUS | Status: DC | PRN
  Administered 2021-10-24 (×2): 10 mg via INTRAVENOUS

## 2021-10-24 MED ORDER — ONDANSETRON HCL 4 MG/2ML IJ SOLN
INTRAMUSCULAR | Status: AC
  Filled 2021-10-24: qty 2

## 2021-10-24 MED ORDER — IODIXANOL 320 MG/ML IV SOLN
INTRAVENOUS | Status: DC | PRN
  Administered 2021-10-24: 65 mL via INTRA_ARTERIAL

## 2021-10-24 MED ORDER — CLOPIDOGREL BISULFATE 75 MG PO TABS
75.0000 mg | ORAL_TABLET | Freq: Every day | ORAL | Status: DC
  Administered 2021-10-25 – 2021-10-30 (×6): 75 mg via ORAL
  Filled 2021-10-24 (×7): qty 1

## 2021-10-24 MED ORDER — DIPHENHYDRAMINE HCL 50 MG/ML IJ SOLN: 50.0000 mg | Freq: Once | INTRAMUSCULAR | Status: DC | PRN

## 2021-10-24 MED ORDER — MIDAZOLAM HCL 5 MG/5ML IJ SOLN
INTRAMUSCULAR | Status: AC
  Filled 2021-10-24: qty 5

## 2021-10-24 MED ORDER — FENTANYL CITRATE (PF) 100 MCG/2ML IJ SOLN
INTRAMUSCULAR | Status: AC
  Filled 2021-10-24: qty 2

## 2021-10-24 MED ORDER — METHYLPREDNISOLONE SODIUM SUCC 125 MG IJ SOLR: 125.0000 mg | Freq: Once | INTRAMUSCULAR | Status: DC | PRN

## 2021-10-24 MED ORDER — MIDAZOLAM HCL 2 MG/2ML IJ SOLN
INTRAMUSCULAR | Status: AC
  Filled 2021-10-24: qty 2

## 2021-10-24 MED ORDER — MAGNESIUM HYDROXIDE 400 MG/5ML PO SUSP
30.0000 mL | Freq: Two times a day (BID) | ORAL | Status: AC
  Administered 2021-10-24 – 2021-10-25 (×2): 30 mL via ORAL
  Filled 2021-10-24 (×2): qty 30

## 2021-10-24 MED ORDER — CEFAZOLIN SODIUM-DEXTROSE 1-4 GM/50ML-% IV SOLN
INTRAVENOUS | Status: AC | PRN
  Administered 2021-10-24: 2 g via INTRAVENOUS

## 2021-10-24 SURGICAL SUPPLY — 22 items
BALLN LUTONIX DCB 4X40X130 (BALLOONS) ×2
BALLOON LUTONIX DCB 4X40X130 (BALLOONS) IMPLANT
CANNULA 5F STIFF (CANNULA) ×1 IMPLANT
CATH ANGIO 5F PIGTAIL 65CM (CATHETERS) ×1 IMPLANT
CATH VS1 5X80 (CATHETERS) ×1 IMPLANT
CATH VS15FR (CATHETERS) ×1 IMPLANT
COVER PROBE U/S 5X48 (MISCELLANEOUS) ×1 IMPLANT
DEVICE SAFEGUARD 24CM (GAUZE/BANDAGES/DRESSINGS) ×1 IMPLANT
DEVICE STARCLOSE SE CLOSURE (Vascular Products) ×1 IMPLANT
DEVICE TORQUE .025-.038 (MISCELLANEOUS) ×1 IMPLANT
GLIDECATH 4FR STR (CATHETERS) ×1 IMPLANT
GLIDEWIRE ADV .035X260CM (WIRE) ×1 IMPLANT
GLIDEWIRE ANGLED SS 035X260CM (WIRE) ×1 IMPLANT
GLIDEWIRE STIFF .35X180X3 HYDR (WIRE) ×1 IMPLANT
GUIDEWIRE ADV .018X180CM (WIRE) ×1 IMPLANT
KIT ENCORE 26 ADVANTAGE (KITS) ×1 IMPLANT
PACK ANGIOGRAPHY (CUSTOM PROCEDURE TRAY) ×1 IMPLANT
SHEATH ANL2 6FRX45 HC (SHEATH) ×1 IMPLANT
SHEATH BRITE TIP 5FRX11 (SHEATH) ×1 IMPLANT
SYR MEDRAD MARK 7 150ML (SYRINGE) ×1 IMPLANT
TUBING CONTRAST HIGH PRESS 72 (TUBING) ×1 IMPLANT
WIRE GUIDERIGHT .035X150 (WIRE) ×1 IMPLANT

## 2021-10-24 NOTE — Interval H&P Note (Signed)
History and Physical Interval Note:  10/24/2021 8:21 PM  Suzanne [name]  has presented today for surgery, with the diagnosis of mesenteric ischemia.  The various methods of treatment have been discussed with the patient and family. After consideration of risks, benefits and other options for treatment, the patient has consented to  Procedure(s): VISCERAL ARTERY INTERVENTION (N/A) as a surgical intervention.  The patient's history has been reviewed, patient examined, no change in status, stable for surgery.  I have reviewed the patient's chart and labs.  Questions were answered to the patient's satisfaction.     Hortencia Pilar

## 2021-10-24 NOTE — Progress Notes (Signed)
[facility]  Daily Progress Note   Subjective  -   The patient continues to endorse severe abdominal pain even following initiation of heparin drip.  Objective Vitals:   10/23/21 1700 10/23/21 2000 10/23/21 2032 10/23/21 2342  BP: (!) 180/92 (!) 201/111 (!) 200/102 (!) 189/90  Pulse: 85 84    Resp:  16    Temp:  98.2 F (36.8 C)    TempSrc:      SpO2:  100%    Weight:      Height:        Intake/Output Summary (Last 24 hours) at 10/24/2021 0108 Last data filed at 10/23/2021 1845 Gross per 24 hour  Intake 1184.5 ml  Output --  Net 1184.5 ml    PULM  CTAB CV  RRR VASC  continued abdominal pain despite heparin drip, somnolent but irritable  Laboratory CBC    Component Value Date/Time   WBC 16.0 (H) 10/23/2021 0108   HGB 9.9 (L) 10/23/2021 0108   HGB 17.1 (H) 04/23/2011 1538   HCT 32.4 (L) 10/23/2021 0108   HCT 50.5 (H) 04/23/2011 1538   PLT 718 (H) 10/23/2021 0108   PLT 419 04/23/2011 1538    BMET    Component Value Date/Time   NA 139 10/23/2021 0108   NA 140 04/23/2011 1538   K 3.6 10/23/2021 0108   K 3.9 04/23/2011 1538   CL 107 10/23/2021 0108   CL 105 04/23/2011 1538   CO2 23 10/23/2021 0108   CO2 25 04/23/2011 1538   GLUCOSE 119 (H) 10/23/2021 0108   GLUCOSE 117 (H) 04/23/2011 1538   BUN 7 10/23/2021 0108   BUN 2 (L) 04/23/2011 1538   CREATININE 0.79 10/23/2021 0108   CREATININE 0.63 04/23/2011 1538   CALCIUM 8.8 (L) 10/23/2021 0108   CALCIUM 8.9 04/23/2011 1538   GFRNONAA >60 10/23/2021 0108   GFRNONAA >60 04/23/2011 1538   GFRAA >60 12/22/2018 0839   GFRAA >60 04/23/2011 1538    Assessment/Planning: Discussed mesenteric angiogram with the patient.  She is agreeable to proceed.  Also stressed the importance of not stopping anticoagulation when she leaves the hospital.  We will attempt to provide the patient with the application for medication assistance.  We discussed risk benefits alternatives the patient is agreeable to  proceed.  Kris Hartmann  10/24/2021, 1:08 AM

## 2021-10-24 NOTE — H&P (View-Only) (Signed)
[facility]  Daily Progress Note   Subjective  -   The patient continues to endorse severe abdominal pain even following initiation of heparin drip.  Objective Vitals:   10/23/21 1700 10/23/21 2000 10/23/21 2032 10/23/21 2342  BP: (!) 180/92 (!) 201/111 (!) 200/102 (!) 189/90  Pulse: 85 84    Resp:  16    Temp:  98.2 F (36.8 C)    TempSrc:      SpO2:  100%    Weight:      Height:        Intake/Output Summary (Last 24 hours) at 10/24/2021 0108 Last data filed at 10/23/2021 1845 Gross per 24 hour  Intake 1184.5 ml  Output --  Net 1184.5 ml    PULM  CTAB CV  RRR VASC  continued abdominal pain despite heparin drip, somnolent but irritable  Laboratory CBC    Component Value Date/Time   WBC 16.0 (H) 10/23/2021 0108   HGB 9.9 (L) 10/23/2021 0108   HGB 17.1 (H) 04/23/2011 1538   HCT 32.4 (L) 10/23/2021 0108   HCT 50.5 (H) 04/23/2011 1538   PLT 718 (H) 10/23/2021 0108   PLT 419 04/23/2011 1538    BMET    Component Value Date/Time   NA 139 10/23/2021 0108   NA 140 04/23/2011 1538   K 3.6 10/23/2021 0108   K 3.9 04/23/2011 1538   CL 107 10/23/2021 0108   CL 105 04/23/2011 1538   CO2 23 10/23/2021 0108   CO2 25 04/23/2011 1538   GLUCOSE 119 (H) 10/23/2021 0108   GLUCOSE 117 (H) 04/23/2011 1538   BUN 7 10/23/2021 0108   BUN 2 (L) 04/23/2011 1538   CREATININE 0.79 10/23/2021 0108   CREATININE 0.63 04/23/2011 1538   CALCIUM 8.8 (L) 10/23/2021 0108   CALCIUM 8.9 04/23/2011 1538   GFRNONAA >60 10/23/2021 0108   GFRNONAA >60 04/23/2011 1538   GFRAA >60 12/22/2018 0839   GFRAA >60 04/23/2011 1538    Assessment/Planning: Discussed mesenteric angiogram with the patient.  She is agreeable to proceed.  Also stressed the importance of not stopping anticoagulation when she leaves the hospital.  We will attempt to provide the patient with the application for medication assistance.  We discussed risk benefits alternatives the patient is agreeable to  proceed.  Kris Hartmann  10/24/2021, 1:08 AM

## 2021-10-24 NOTE — Progress Notes (Addendum)
PROGRESS NOTE    LYLIANA DICENSO  EPP:[id number] DOB: 24-Apr-1975 DOA: 10/22/2021 PCP: System, Provider Not In   Brief Narrative:   Suzanne [name] is a 46 y.o. 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 Eulogio Ditch) presented to hospital with severe abdominal pain with tenderness across the entire abdomen for few weeks that got worse for a few days prior to presentation.  Patient also had some bloody bowel movements and endorses postprandial pain. 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    At this time, vascular surgery on board and patient is currently on heparin drip.  Plan for mesenteric angiogram at this time.  GI has been consulted to rule out ulcerative disease.  Assessment & Plan:   Principal Problem:   Mesenteric ischemia (HCC) Active Problems:   Abdominal pain   PVD (peripheral vascular disease) (HCC)   Hypercoagulable state (HCC)   Leg pain, right   GERD (gastroesophageal reflux disease)   Alcohol abuse   Chronic pain   COPD (chronic obstructive pulmonary disease) (HCC)   Essential hypertension   Tobacco use   Hypokalemia   Assessment and Plan: *Abdominal pain/mesenteric ischemia (HCC) CT abd/pelvis with severe atherosclerotic plaque with marked noncalcified atheromatous plaque of the thoracic and abdominal aorta leading to diffuse stenosis with the infrarenal abdominal aortal down to a caliber of 0.7x1.2 cm.  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  Thin chronic thrombus string like thrombus within the origin of the superior mesenteric artery and left  subclavian artery.  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  Currently on heparin drip.  Vascular surgery planning for mesenteric angiogram.  Currently NPO.  Of note patient had stopped anticoagulation 2 months ago.  She was emphasized the need for continuation of anticoagulation on discharge.  Currently on Zosyn.  GI was also consulted for possible ulcerative disease.  We will follow GI recommendations as well.  Leukocytosis.  Could be reactive.  On Zosyn empirically.  Need to monitor for trends.  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  We will need to follow-up with hematology on discharge.  Patient was emphasized the need for remaining compliant with anticoagulation.  Might need assistance with medications on discharge.  PVD (peripheral vascular disease), right leg pain.  (HCC) ABI shows resting ABI in the mild range of occlusive disease on the left and moderate on the right  Daily alcohol abuse On Ativan protocol, continue CIWA monitoring.  Continue thiamine folic acid  GERD (gastroesophageal reflux disease) Continue Protonix twice daily   Chronic pain Hx CRPS.  Currently on IV Dilaudid.  Essential hypertension Blood pressure is elevated likely secondary to pain.  On hydralazine as needed and Dilaudid IV for adequate pain relief.  COPD (chronic obstructive pulmonary disease) (HCC) No shortness of breath or wheezing at this time.  Compensated.  Continue albuterol  and Pulmicort  Hypokalemia.  We will replenish through IV.  Check levels in AM.  Tobacco use On nicotine patch.  DVT prophylaxis:  heparin gtt Code Status: full Family Communication: none Disposition: Home  Status is: Inpatient  Remains inpatient appropriate because: need for continued vascular eval, pain management, IV heparin, possible GI evaluation   Consultants:  vascular surgery GI  Procedures:  none  Antimicrobials:  Anti-infectives (From admission, onward)    Start     Dose/Rate Route Frequency Ordered Stop   10/24/21 1156  ceFAZolin (ANCEF) 2-4 GM/100ML-% IVPB       Note to Pharmacy: Corlis Hove H: cabinet override      10/24/21 1156 10/24/21 2359   10/24/21 1125  ceFAZolin (ANCEF) IVPB 2g/100 mL premix        2 g 200 mL/hr over 30 Minutes Intravenous 30 min pre-op 10/24/21 1125     10/24/21 0600  ceFAZolin (ANCEF) IVPB 2g/100 mL premix        2 g 200 mL/hr over 30 Minutes Intravenous On call to O.R. 10/23/21 1548 10/25/21 0559   10/22/21 2200  piperacillin-tazobactam (ZOSYN) IVPB 3.375 g        3.375 g 12.5 mL/hr over 240 Minutes Intravenous Every 8 hours 10/22/21 2044         Subjective: Today, patient was seen and examined at bedside.  Frustrated about being n.p.o. and asking for clear liquids.  Complains of pain abdomen which is continuous with some headache.  Feels like her headache is due to lack of food.  Has had bowel movement yesterday and passed flatus.  Mild nausea but no vomiting.  No chest pain or shortness of breath  Objective: Vitals:   10/23/21 2032 10/23/21 2342 10/24/21 0846 10/24/21 1149  BP: (!) 200/102 (!) 189/90 (!) 164/78   Pulse:   83 86  Resp:   18 12  Temp:   98.2 F (36.8 C) 98.2 F (36.8 C)  TempSrc:   Oral Oral  SpO2:   95% 97%  Weight:      Height:        Intake/Output Summary (Last 24 hours) at 10/24/2021 1322 Last data filed at 10/24/2021 2353 Gross per 24 hour  Intake 1117.5 ml  Output --  Net 1117.5 ml   Filed Weights   10/22/21 1407  Weight: 61.2 kg    Physical examination:  General:  Average built, not in obvious distress, feels  frustrated, mildly sleepy HENT:   No scleral pallor or icterus noted. Oral mucosa is moist.  Chest:  Clear breath sounds.  Diminished breath sounds bilaterally. No crackles or wheezes.  CVS: S1 &S2 heard. No murmur.  Regular rate and rhythm. Abdomen: Diffuse tenderness over the abdomen on palpation Extremities: No cyanosis, clubbing or edema.  Peripheral pulses are palpable. Psych: Alert, awake and oriented, low mood, CNS:  No cranial nerve deficits.  Moves all extremities. Skin: Warm and dry.  No rashes noted.  Data Reviewed: I have personally reviewed following labs and imaging studies  CBC: Recent Labs  Lab 10/22/21 1417 10/23/21 0108 10/24/21 0426  WBC 14.7* 16.0* 21.2*  NEUTROABS  --   --  16.6*  HGB 10.6* 9.9* 9.6*  HCT 34.6* 32.4* 31.1*  MCV 74.6* 75.2* 74.4*  PLT 784* 718* 614*   Basic Metabolic Panel: Recent Labs  Lab 10/22/21 1417 10/23/21 0108 10/24/21 0426  NA 135 139 133*  K 3.8 3.6 3.4*  CL 103 107 101  CO2 21* 23 24  GLUCOSE 121* 119* 125*  BUN '6 7 8  '$ CREATININE 0.82 0.79 0.91  CALCIUM 9.1 8.8* 8.7*  MG  --   --  1.9  PHOS  --   --  3.1   GFR: Estimated Creatinine Clearance: 76.2 mL/min (by C-G formula based on SCr of 0.91 mg/dL).  Liver Function Tests: Recent Labs  Lab 10/22/21 1409 10/24/21 0426  AST 14* 28  ALT 9 17  ALKPHOS 83 79  BILITOT 0.5 0.6  PROT 7.7 7.2  ALBUMIN 3.2* 2.9*   CBG: No results for input(s): "GLUCAP" in the last 168 hours.   No results found for this or any previous visit (from the past 240 hour(s)).   Radiology Studies: US ARTERIAL ABI (SCREENING LOWER EXTREMITY)  Result Date: 10/24/2021 CLINICAL DATA:  46 year old female with history of mesenteric ischemia, prior thromboembolic disease EXAM: NONINVASIVE PHYSIOLOGIC VASCULAR STUDY OF BILATERAL LOWER EXTREMITIES TECHNIQUE: Evaluation of both lower extremities was performed at rest, including calculation of ankle-brachial indices, multiple segmental pressure  evaluation, segmental Doppler and segmental pulse volume recording. COMPARISON:  None Available. FINDINGS: Right ABI:  0.43 Left ABI:  0.78 Right Lower Extremity: Segmental Doppler at the right ankle demonstrates flat line of the posterior tibial artery and monophasic dorsalis pedis. Left Lower Extremity: Segmental Doppler at the left ankle demonstrates multiphasic waveforms. IMPRESSION: Right: Resting ABI in the moderate range of arterial occlusive disease and would be expected to decrease after exercise exam. Segmental exam at the ankle demonstrates more proximal occlusive disease. Left: Resting ABI in the mild range of arterial occlusive disease. This might decrease after exercise exam. Segmental exam at the ankle demonstrates waveforms relatively maintained. Signed, Dulcy Fanny. Nadene Rubins, RPVI [facility] Electronically Signed   By: Corrie Mckusick D.O.   On: 10/24/2021 08:26   CT Angio Chest/Abd/Pel for Dissection W and/or Wo Contrast  Addendum Date: 10/22/2021   ADDENDUM REPORT: 10/22/2021 18:07 ADDENDUM: These results were called by telephone at the time of interpretation on 10/22/2021 at 6:07 pm to provider Merlyn Lot , who verbally acknowledged these results. Electronically Signed   By: Iven Finn M.D.   On: 10/22/2021 18:07   Result Date: 10/22/2021 CLINICAL DATA:  Aortic atherosclerosis. Pt via POV from home. Pt c/o epigastric pain that radiates to her back, SOB, headaches, and emesis that has been going on the past month, states today it is the worse it has ever been. States that she has been without her Eliquis EXAM: CT ANGIOGRAPHY CHEST, ABDOMEN AND PELVIS TECHNIQUE: Non-contrast CT of the chest was initially obtained. Multidetector CT imaging through the chest, abdomen and pelvis was performed using the standard protocol during bolus administration of intravenous contrast. Multiplanar reconstructed images and MIPs were obtained  and reviewed to evaluate the vascular anatomy. RADIATION DOSE REDUCTION: This exam was performed according to the departmental dose-optimization program which includes automated exposure control, adjustment of the mA and/or kV according to patient size and/or use of iterative reconstruction technique. CONTRAST:  66m OMNIPAQUE IOHEXOL 350 MG/ML SOLN COMPARISON:  CT angiography chest, abdomen, pelvis 03/10/2020 FINDINGS: CTA CHEST FINDINGS Cardiovascular: Preferential opacification of the thoracic aorta. No evidence of thoracic aortic aneurysm or dissection. Severe atherosclerotic plaque with mid to distal descending thoracic aorta noncalcified atheromatous plaque. Thin central filling defect within the origin of the left subclavian artery likely chronic web or thrombus. No coronary artery calcifications. Normal heart size. No significant pericardial effusion. The main pulmonary artery is normal in caliber. No  central or proximal segmental pulmonary embolus. Mediastinum/Nodes: No enlarged mediastinal, hilar, or axillary lymph nodes. Thyroid gland, trachea, and esophagus demonstrate no significant findings. Lungs/Pleura: Mild paraseptal emphysematous changes peer no focal consolidation. No pulmonary nodule. No pulmonary mass. No pleural effusion. No pneumothorax. Musculoskeletal: No chest wall abnormality. No suspicious lytic or blastic osseous lesions. No acute displaced fracture. Review of the MIP images confirms the above findings. CTA ABDOMEN AND PELVIS FINDINGS VASCULAR Aorta: Severe atherosclerotic plaque with marked noncalcified atheromatous plaque leading to diffuse stenosis of the abdominal aorta with the infrarenal abdominal aorta down to a caliber of 0.7 x 1.2 cm. Normal caliber aorta without aneurysm, dissection, vasculitis. 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 SMA: Thin chronic thrombus string like thrombus within the origin of the  superior mesenteric artery (6:140). Patent without evidence of aneurysm, dissection, vasculitis or significant stenosis. Renals: Both renal arteries are patent without evidence of aneurysm, dissection, vasculitis, fibromuscular dysplasia or significant stenosis. 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 Inflow: Patent without evidence of aneurysm, dissection, vasculitis or significant stenosis. Veins: No obvious venous abnormality within the limitations of this arterial phase study. Review of the MIP images confirms the above findings. NON-VASCULAR Hepatobiliary: Query trace nodular hepatic contour. Liver measures at the upper limits of normal. No focal liver abnormality. No gallstones, gallbladder wall thickening, or pericholecystic fluid. No biliary dilatation. Pancreas: No focal lesion. Normal pancreatic contour. No surrounding inflammatory changes. No main pancreatic ductal dilatation. Spleen: Normal in size without focal abnormality. Adrenals/Urinary Tract: No adrenal nodule bilaterally. Bilateral kidneys enhance symmetrically. Bilateral renal cortical scarring. Subcentimeter hypodensities too small to characterize. No definite wedge-shaped peripheral hypodensities suggest infarction. No hydronephrosis. No hydroureter. The urinary bladder is unremarkable. Stomach/Bowel: Stomach is within normal limits. No evidence of bowel wall thickening or dilatation. No pneumatosis. Appendix appears normal. Lymphatic: No lymphadenopathy. Reproductive: Uterus and bilateral adnexa are unremarkable. Other: No intraperitoneal free fluid. No intraperitoneal free gas. No organized fluid collection. Musculoskeletal: No abdominal wall hernia or abnormality. No suspicious lytic or blastic osseous lesions. No acute displaced fracture. Please moderate degenerative changes of the hips. Review of the MIP images confirms the above findings. IMPRESSION: 1.  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 2. Severe atherosclerotic plaque with marked noncalcified atheromatous plaque of the thoracic and abdominal aorta leading to diffuse stenosis with the infrarenal abdominal aorta down to a caliber of 0.7 x 1.2 cm. 3. 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 4. Thin chronic thrombus string like thrombus within the origin of the superior mesenteric artery and left subclavian artery. 5. No central or proximal segmental pulmonary embolus. 6. No intrathoracic, intra-abdominal, intrapelvic abnormality findings to suggest infarction/ischemia. 7. Query cirrhotic morphology of the liver. 8. Aortic Atherosclerosis (ICD10-I70.0) and Emphysema (ICD10-J43.9). Electronically Signed: By: Iven Finn M.D. On: 10/22/2021 18:04   DG Chest 2 View  Result Date: 10/22/2021 CLINICAL DATA:  Acute shortness of breath. EXAM: CHEST - 2 VIEW COMPARISON:  11/20/2016 radiograph FINDINGS: The cardiomediastinal silhouette is unremarkable. There is no evidence of focal airspace disease, pulmonary edema, suspicious pulmonary nodule/mass, pleural effusion, or pneumothorax. No acute bony abnormalities are identified. IMPRESSION: No active cardiopulmonary disease. Electronically Signed   By: Margarette Canada M.D.   On: 10/22/2021 14:45    Scheduled Meds:  atorvastatin  80 mg  Oral Daily   budesonide (PULMICORT) nebulizer solution  0.5 mg Nebulization BID   folic acid  1 mg Oral Daily   multivitamin with minerals  1 tablet Oral Daily   nicotine  21 mg Transdermal Daily   pantoprazole (PROTONIX) IV  40 mg Intravenous Q12H   thiamine  100 mg Oral Daily   Or   thiamine  100 mg Intravenous Daily   Continuous Infusions:  sodium chloride 75 mL/hr at 10/24/21 1247   ceFAZolin      ceFAZolin (ANCEF) IV      ceFAZolin (ANCEF) IV     heparin Stopped  (10/24/21 1246)   piperacillin-tazobactam (ZOSYN)  IV 3.375 g (10/24/21 1105)     LOS: 2 days   Flora Lipps, MD [facility] 10/24/2021, 1:22 PM

## 2021-10-24 NOTE — Consult Note (Signed)
ANTICOAGULATION CONSULT NOTE - Follow Up Consult  Pharmacy Consult for Heparin gtt Indication:  aortic thrombus/MI  No Known Allergies  Patient Measurements: Height: '5\' 7"'$  (170.2 cm) Weight: 61.2 kg (135 lb) IBW/kg (Calculated) : 61.6 Heparin Dosing Weight: 61.2kg  Vital Signs: Temp: 98.2 F (36.8 C) (07/05 2000) Temp Source: Oral (07/05 1615) BP: 200/102 (07/05 2032) Pulse Rate: 84 (07/05 2000)  Labs: Recent Labs    10/22/21 1417 10/22/21 1823 10/22/21 1823 10/23/21 0108 10/23/21 0702 10/23/21 1714 10/23/21 2309  HGB 10.6*  --   --  9.9*  --   --   --   HCT 34.6*  --   --  32.4*  --   --   --   PLT 784*  --   --  718*  --   --   --   APTT  --  30  --  55*  --   --   --   LABPROT  --  14.0  --   --   --   --   --   INR  --  1.1  --   --   --   --   --   HEPARINUNFRC  --  <0.10*   < > 0.33 0.22* 0.45 0.45  CREATININE 0.82  --   --  0.79  --   --   --   TROPONINIHS 7 9  --   --   --   --   --    < > = values in this interval not displayed.   Heparin Dosing Weight: 61.2kg  Estimated Creatinine Clearance: 86.7 mL/min (by C-G formula based on SCr of 0.79 mg/dL).  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 Pharmacy consulted for mgmt of heparin gtt.  Date Time aPTT/HL Rate/Comment 7/5 0702 HL 0.22 SUBtherapeutic @ 1100 units/hr     7/5 1714 HL 0.45 Therapeutic x1 @ 1250 units/hr  Baseline Labs: aPTT - pending INR - pending HL - pending Hgb - 10.6 Plts - 784  Goal of Therapy:  Heparin level 0.3-0.7 units/ml aPTT 66-102 seconds Monitor platelets by anticoagulation protocol: Yes   Plan:  7/5: HL @ 2309 = 0.45, therapeutic X  2 Will continue pt on current rate and recheck HL on 7/6 with AM labs.    ROrene Desanctis PharmD Clinical Pharmacist 10/24/2021 12:13 AM

## 2021-10-24 NOTE — Consult Note (Signed)
ANTICOAGULATION CONSULT NOTE - Follow Up Consult  Pharmacy Consult for Heparin gtt Indication:  aortic thrombus/MI  No Known Allergies  Patient Measurements: Height: '5\' 7"'$  (170.2 cm) Weight: 61.2 kg (135 lb) IBW/kg (Calculated) : 61.6 Heparin Dosing Weight: 61.2kg  Vital Signs: Temp: 98.2 F (36.8 C) (07/05 2000) BP: 189/90 (07/05 2342) Pulse Rate: 84 (07/05 2000)  Labs: Recent Labs    10/22/21 1417 10/22/21 1823 10/22/21 1823 10/23/21 0108 10/23/21 0702 10/23/21 1714 10/23/21 2309 10/24/21 0426  HGB 10.6*  --   --  9.9*  --   --   --  9.6*  HCT 34.6*  --   --  32.4*  --   --   --  31.1*  PLT 784*  --   --  718*  --   --   --  764*  APTT  --  30  --  55*  --   --   --   --   LABPROT  --  14.0  --   --   --   --   --   --   INR  --  1.1  --   --   --   --   --   --   HEPARINUNFRC  --  <0.10*   < > 0.33   < > 0.45 0.45 0.35  CREATININE 0.82  --   --  0.79  --   --   --  0.91  TROPONINIHS 7 9  --   --   --   --   --   --    < > = values in this interval not displayed.   Heparin Dosing Weight: 61.2kg  Estimated Creatinine Clearance: 76.2 mL/min (by C-G formula based on SCr of 0.91 mg/dL).  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 Pharmacy consulted for mgmt of heparin gtt.  Date Time aPTT/HL Rate/Comment 7/5 0702 HL 0.22 SUBtherapeutic @ 1100 units/hr     7/5 1714 HL 0.45 Therapeutic x1 @ 1250 units/hr 7/6      0426    HL 0.35           Therapeutic X 3 @ 1250 units/hr  Baseline Labs: aPTT - pending INR - pending HL - pending Hgb - 10.6 Plts - 784  Goal of Therapy:  Heparin level 0.3-0.7 units/ml aPTT 66-102 seconds Monitor  platelets by anticoagulation protocol: Yes   Plan:  7/6: HL @ 0426 = 0.35, therapeutic X 3 Will continue pt on current rate and recheck HL on 7/7 with AM labs.    ROrene Desanctis PharmD Clinical Pharmacist 10/24/2021 5:52 AM

## 2021-10-24 NOTE — Consult Note (Signed)
ANTICOAGULATION CONSULT NOTE - Follow Up Consult  Pharmacy Consult for Heparin gtt Indication:  aortic thrombus/MI  No Known Allergies  Patient Measurements: Height: '5\' 7"'$  (170.2 cm) Weight: 61.2 kg (135 lb) IBW/kg (Calculated) : 61.6 Heparin Dosing Weight: 61.2kg  Vital Signs: Temp: 98.3 F (36.8 C) (07/06 2001) Temp Source: Oral (07/06 1149) BP: 152/83 (07/06 2001) Pulse Rate: 70 (07/06 2001)  Labs: Recent Labs    10/22/21 1417 10/22/21 1823 10/22/21 1823 10/23/21 0108 10/23/21 0702 10/23/21 1714 10/23/21 2309 10/24/21 0426  HGB 10.6*  --   --  9.9*  --   --   --  9.6*  HCT 34.6*  --   --  32.4*  --   --   --  31.1*  PLT 784*  --   --  718*  --   --   --  764*  APTT  --  30  --  55*  --   --   --   --   LABPROT  --  14.0  --   --   --   --   --   --   INR  --  1.1  --   --   --   --   --   --   HEPARINUNFRC  --  <0.10*   < > 0.33   < > 0.45 0.45 0.35  CREATININE 0.82  --   --  0.79  --   --   --  0.91  TROPONINIHS 7 9  --   --   --   --   --   --    < > = values in this interval not displayed.   Heparin Dosing Weight: 61.2kg  Estimated Creatinine Clearance: 76.2 mL/min (by C-G formula based on SCr of 0.91 mg/dL).  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 Pharmacy consulted for mgmt of heparin gtt.  Date Time aPTT/HL Rate/Comment 7/5 0702 HL 0.22 SUBtherapeutic @ 1100 units/hr     7/5 1714 HL 0.45 Therapeutic x1 @ 1250 units/hr 7/6      0426    HL 0.35           Therapeutic X 3 @ 1250 units/hr 7/6 1246 - 2100  HEPARIN STOPPED FOR PROCEDURE  Baseline Labs: aPTT - pending INR - pending HL - pending Hgb - 10.6 Plts -  784  Goal of Therapy:  Heparin level 0.3-0.7 units/ml aPTT 66-102 seconds Monitor platelets by anticoagulation protocol: Yes   Plan:  Resume heparin 10/24/21 at 2100 at previous therapeutic rate. Check heparin level 6 hours following resuming infusion to confirm  CBC with AM labs   CDorothe Pea PharmD, BCPS Clinical Pharmacist   10/24/2021 8:15 PM

## 2021-10-24 NOTE — Op Note (Signed)
[facility]  Percutaneous Study/Intervention Procedural Note   Date of Surgery: 10/24/2021  Surgeon: Hortencia Pilar  Pre-operative Diagnosis: Chronic mesenteric ischemia; recurrent abdominal pain; occlusion celiac artery  Post-operative diagnosis:  Same  Procedure(s) Performed:  1.  Abdominal aortogram in the lateral views  2.  Selective injection of the celiac artery third order catheter placement  3.  Percutaneous transluminal angioplasty of the celiac artery             4.  Star[employer] right common femoral artery    Anesthesia: Conscious sedation was administered under my direct supervision by the interventional radiology RN. IV Versed plus fentanyl were utilized. Continuous ECG, pulse oximetry and blood pressure was monitored throughout the entire procedure.  Conscious sedation was for a total of 1 hour 22 minutes and 20 seconds.  Sheath: 6 [place] Ansell sheath right common femoral retrograde  Contrast: 65 cc  Fluoroscopy Time: 26.2 minutes  Indications:  Suzanne [name] presented with abdominal pain. CT scan demonstrated a dramatic increase in the mural thrombus throughout her aorta associated with occlusion of the celiac artery.  By comparison her most recent previous scan demonstrated the celiac was patent.  The risk and benefits of angiography with possible intervention have been reviewed, all questions were answered and the patient has agreed to proceed with angiography and possible intervention.  Procedure:  Suzanne [name] is a 47 y.o. female who was identified and appropriate procedural time out was performed.  The patient was then placed supine on the table and prepped and draped in the usual sterile fashion.    Ultrasound was used to evaluate the right common femoral artery.  It was pulsatile and echolucent indicating it was patent .  A micropuncture needle was used to access the left radial artery under direct ultrasound guidance and an image  was recorded for the permanent record.  A 0.035 J wire was advanced without resistance and a 5Fr sheath was placed.  J-wire with a pigtail catheter was then position to the level of T10 and lateral view of the aorta was obtained. This localized the SMA although the celiac artery was not identified, not even retrograde filling of the hepatic and splenic arteries was noted. Working in the lateral projection pigtail catheter was exchanged for a V S1 catheter over a Stiff Angled Glidewire and after numerous attempts the celiac was engaged with the catheter, this represented first order catheter placement.   5000 units of heparin was given and allowed to circulate for several minutes.   Working between the [employer] and lateral projection hand injection at the origin of the celiac artery was then made after the wire had been negotiated out into both the splenic and hepatic arteries.   At 1 point I was able to negotiate a prograde catheter out into the hepatic as well as what appeared to be the gastroduodenal arteries.  Hand-injection of contrast through the prograde demonstrated profound distal disease.  Ultimately over a 0.018 wire I was able to advance a 4 mm x 40 mm Lutonix drug-eluting balloon angioplasty was performed to 10 atm for 1 minute.  Follow up imaging demonstrated a channel have been created however the amount of distal disease and disease at the bifurcation of the hepatic and the splenic precluded stent placement and I elected to terminate the case at this point  After review these images the catheters removed over wire oblique view of the right groin is obtained and a minx device deployed  without difficulty there are no immediate complications.  Findings:   Aortogram: Aortogram did demonstrates marked irregularities consistent with the profound burden of mural thrombus noted.  The SMA appears to be widely patent and the thrombus noted on the CT scan is not identified on angiography.  This  would indicate that it is minimal and not flow-limiting.  The celiac origin is not identified on aortogram and of even greater significance there is no retrograde filling of the hepatic or the splenic visualized on aortogram.  I was able to cannulate the celiac and imaging through a prograde catheter out into the tertiary branches demonstrates profound distal disease in the hepatic there is also significant disease in the gastroduodenal.  Although I was able to open up a channel and provide a modest improvement in flow using angioplasty, there is profound disease at the bifurcation of the hepatic and the splenic which I believe is not reconstructable using interventional techniques nor do I believe she is a surgical candidate given the extensive distal disease that is present.     Disposition: Patient was taken to the recovery room in stable condition having tolerated the procedure well.  Suzanne [name] Suzanne [name] 10/24/2021,8:21 PM

## 2021-10-25 ENCOUNTER — Inpatient Hospital Stay: Payer: Medicaid Other

## 2021-10-25 ENCOUNTER — Encounter: Payer: Self-pay | Admitting: Vascular Surgery

## 2021-10-25 ENCOUNTER — Other Ambulatory Visit: Payer: Self-pay

## 2021-10-25 DIAGNOSIS — R110 Nausea: Secondary | ICD-10-CM

## 2021-10-25 DIAGNOSIS — G8929 Other chronic pain: Secondary | ICD-10-CM | POA: Diagnosis not present

## 2021-10-25 DIAGNOSIS — F1010 Alcohol abuse, uncomplicated: Secondary | ICD-10-CM | POA: Diagnosis not present

## 2021-10-25 DIAGNOSIS — R1013 Epigastric pain: Secondary | ICD-10-CM | POA: Diagnosis not present

## 2021-10-25 DIAGNOSIS — K559 Vascular disorder of intestine, unspecified: Secondary | ICD-10-CM | POA: Diagnosis not present

## 2021-10-25 DIAGNOSIS — K551 Chronic vascular disorders of intestine: Secondary | ICD-10-CM | POA: Diagnosis not present

## 2021-10-25 DIAGNOSIS — R1084 Generalized abdominal pain: Secondary | ICD-10-CM | POA: Diagnosis not present

## 2021-10-25 LAB — CBC
HCT: 28.6 % — ABNORMAL LOW (ref 36.0–46.0)
Hemoglobin: 8.9 g/dL — ABNORMAL LOW (ref 12.0–15.0)
MCH: 23.2 pg — ABNORMAL LOW (ref 26.0–34.0)
MCHC: 31.1 g/dL (ref 30.0–36.0)
MCV: 74.5 fL — ABNORMAL LOW (ref 80.0–100.0)
Platelets: 614 10*3/uL — ABNORMAL HIGH (ref 150–400)
RBC: 3.84 MIL/uL — ABNORMAL LOW (ref 3.87–5.11)
RDW: 18.7 % — ABNORMAL HIGH (ref 11.5–15.5)
WBC: 26.1 10*3/uL — ABNORMAL HIGH (ref 4.0–10.5)
nRBC: 0 % (ref 0.0–0.2)

## 2021-10-25 LAB — LACTIC ACID, PLASMA
Lactic Acid, Venous: 1.3 mmol/L (ref 0.5–1.9)
Lactic Acid, Venous: 2.1 mmol/L (ref 0.5–1.9)
Lactic Acid, Venous: 0.8 mmol/L (ref 0.5–1.9)

## 2021-10-25 LAB — BASIC METABOLIC PANEL
Anion gap: 6 (ref 5–15)
BUN: 6 mg/dL (ref 6–20)
CO2: 25 mmol/L (ref 22–32)
Calcium: 8.2 mg/dL — ABNORMAL LOW (ref 8.9–10.3)
Chloride: 101 mmol/L (ref 98–111)
Creatinine, Ser: 0.91 mg/dL (ref 0.44–1.00)
GFR, Estimated: >60 mL/min (ref >60)
Glucose, Bld: 128 mg/dL — ABNORMAL HIGH (ref 70–99)
Potassium: 3.3 mmol/L — ABNORMAL LOW (ref 3.5–5.1)
Sodium: 132 mmol/L — ABNORMAL LOW (ref 135–145)

## 2021-10-25 LAB — HEPARIN LEVEL (UNFRACTIONATED)
Heparin Unfractionated: 0.24 IU/mL — ABNORMAL LOW (ref 0.30–0.70)
Heparin Unfractionated: <0.1 IU/mL — ABNORMAL LOW (ref 0.30–0.70)
Heparin Unfractionated: <0.1 IU/mL — ABNORMAL LOW (ref 0.30–0.70)
Heparin Unfractionated: 0.28 IU/mL — ABNORMAL LOW (ref 0.30–0.70)

## 2021-10-25 LAB — MAGNESIUM: Magnesium: 1.8 mg/dL (ref 1.7–2.4)

## 2021-10-25 LAB — LIPASE, BLOOD: Lipase: 52 U/L — ABNORMAL HIGH (ref 11–51)

## 2021-10-25 MED ORDER — HYDROMORPHONE 1 MG/ML IV SOLN
INTRAVENOUS | Status: DC
  Filled 2021-10-25 (×3): qty 30

## 2021-10-25 MED ORDER — DIPHENHYDRAMINE HCL 12.5 MG/5ML PO ELIX: 12.5000 mg | ORAL_SOLUTION | Freq: Four times a day (QID) | ORAL | Status: DC | PRN

## 2021-10-25 MED ORDER — HYDROMORPHONE HCL 1 MG/ML IJ SOLN
1.0000 mg | INTRAMUSCULAR | Status: AC
  Administered 2021-10-25: 1 mg via INTRAVENOUS
  Filled 2021-10-25: qty 1

## 2021-10-25 MED ORDER — IOHEXOL 9 MG/ML PO SOLN
500.0000 mL | ORAL | Status: AC
  Administered 2021-10-25: 500 mL via ORAL

## 2021-10-25 MED ORDER — HEPARIN BOLUS VIA INFUSION
900.0000 [IU] | Freq: Once | INTRAVENOUS | Status: AC
  Administered 2021-10-25: 900 [IU] via INTRAVENOUS
  Filled 2021-10-25: qty 900

## 2021-10-25 MED ORDER — POTASSIUM CHLORIDE CRYS ER 20 MEQ PO TBCR
40.0000 meq | EXTENDED_RELEASE_TABLET | Freq: Once | ORAL | Status: AC
  Administered 2021-10-25: 40 meq via ORAL
  Filled 2021-10-25: qty 2

## 2021-10-25 MED ORDER — SODIUM CHLORIDE 0.9% FLUSH: 9.0000 mL | INTRAVENOUS | Status: DC | PRN

## 2021-10-25 MED ORDER — DIPHENHYDRAMINE HCL 50 MG/ML IJ SOLN: 12.5000 mg | Freq: Four times a day (QID) | INTRAMUSCULAR | Status: DC | PRN

## 2021-10-25 MED ORDER — PANTOPRAZOLE SODIUM 40 MG PO TBEC: 40.0000 mg | DELAYED_RELEASE_TABLET | Freq: Two times a day (BID) | ORAL | Status: DC

## 2021-10-25 MED ORDER — AMLODIPINE BESYLATE 5 MG PO TABS
5.0000 mg | ORAL_TABLET | Freq: Every day | ORAL | Status: DC
  Administered 2021-10-25 – 2021-10-30 (×6): 5 mg via ORAL
  Filled 2021-10-25 (×6): qty 1

## 2021-10-25 MED ORDER — SODIUM CHLORIDE 0.9 % IV SOLN: INTRAVENOUS | Status: DC

## 2021-10-25 MED ORDER — HEPARIN BOLUS VIA INFUSION
1800.0000 [IU] | Freq: Once | INTRAVENOUS | Status: AC
  Administered 2021-10-25: 1800 [IU] via INTRAVENOUS
  Filled 2021-10-25: qty 1800

## 2021-10-25 MED ORDER — ONDANSETRON HCL 4 MG/2ML IJ SOLN
4.0000 mg | Freq: Four times a day (QID) | INTRAMUSCULAR | Status: DC | PRN
  Administered 2021-10-26 (×2): 4 mg via INTRAVENOUS
  Filled 2021-10-25 (×2): qty 2

## 2021-10-25 MED ORDER — NALOXONE HCL 0.4 MG/ML IJ SOLN: 0.4000 mg | INTRAMUSCULAR | Status: DC | PRN

## 2021-10-25 MED ORDER — PROMETHAZINE HCL 25 MG/ML IJ SOLN: 12.5000 mg | INTRAMUSCULAR | Status: DC | PRN

## 2021-10-25 MED ORDER — HYDROMORPHONE HCL 1 MG/ML IJ SOLN
1.0000 mg | INTRAMUSCULAR | Status: DC | PRN
  Administered 2021-10-25: 1 mg via INTRAVENOUS
  Filled 2021-10-25: qty 1

## 2021-10-25 MED ORDER — IOHEXOL 300 MG/ML  SOLN: 100.0000 mL | Freq: Once | INTRAMUSCULAR | Status: DC | PRN

## 2021-10-25 MED ORDER — PANTOPRAZOLE SODIUM 40 MG IV SOLR
40.0000 mg | Freq: Two times a day (BID) | INTRAVENOUS | Status: DC
  Administered 2021-10-25 – 2021-10-30 (×10): 40 mg via INTRAVENOUS
  Filled 2021-10-25 (×10): qty 10

## 2021-10-25 MED ORDER — APIXABAN 5 MG PO TABS
5.0000 mg | ORAL_TABLET | Freq: Two times a day (BID) | ORAL | 2 refills | Status: DC
Start: 2021-10-25 — End: 2021-10-30
  Filled 2021-10-25: qty 60, 30d supply, fill #0

## 2021-10-25 MED ORDER — MORPHINE SULFATE 1 MG/ML IV SOLN PCA
INTRAVENOUS | Status: DC
  Administered 2021-10-26: 14.34 mg via INTRAVENOUS
  Administered 2021-10-26: 20.12 mg via INTRAVENOUS
  Administered 2021-10-27: 16.5 mg via INTRAVENOUS
  Administered 2021-10-27: 1 mg via INTRAVENOUS
  Administered 2021-10-28: 10.5 mg via INTRAVENOUS
  Filled 2021-10-25 (×10): qty 30

## 2021-10-25 NOTE — Plan of Care (Signed)
Problem: Education: Goal: Knowledge of General Education information will improve Description: Including pain rating scale, medication(s)/side effects and non-pharmacologic comfort measures 10/25/2021 0325 by Jordan Likes, RN Outcome: Progressing 10/25/2021 0325 by Jordan Likes, RN Outcome: Progressing   Problem: Health Behavior/Discharge Planning: Goal: Ability to manage health-related needs will improve 10/25/2021 0325 by Jordan Likes, RN Outcome: Progressing 10/25/2021 0325 by Jordan Likes, RN Outcome: Progressing   Problem: Clinical Measurements: Goal: Ability to maintain clinical measurements within normal limits will improve 10/25/2021 0325 by Jordan Likes, RN Outcome: Progressing 10/25/2021 0325 by Jordan Likes, RN Outcome: Progressing Goal: Will remain free from infection 10/25/2021 0325 by Jordan Likes, RN Outcome: Progressing 10/25/2021 0325 by Jordan Likes, RN Outcome: Progressing Goal: Diagnostic test results will improve 10/25/2021 0325 by Jordan Likes, RN Outcome: Progressing 10/25/2021 0325 by Jordan Likes, RN Outcome: Progressing Goal: Respiratory complications will improve 10/25/2021 0325 by Jordan Likes, RN Outcome: Progressing 10/25/2021 0325 by Jordan Likes, RN Outcome: Progressing Goal: Cardiovascular complication will be avoided 10/25/2021 0325 by Jordan Likes, RN Outcome: Progressing 10/25/2021 0325 by Jordan Likes, RN Outcome: Progressing   Problem: Activity: Goal: Risk for activity intolerance will decrease 10/25/2021 0325 by Jordan Likes, RN Outcome: Progressing 10/25/2021 0325 by Jordan Likes, RN Outcome: Progressing   Problem: Nutrition: Goal: Adequate nutrition will be maintained 10/25/2021 0325 by Jordan Likes, RN Outcome: Progressing 10/25/2021 0325 by Jordan Likes, RN Outcome: Progressing   Problem: Coping: Goal: Level of anxiety will decrease 10/25/2021  0325 by Jordan Likes, RN Outcome: Progressing 10/25/2021 0325 by Jordan Likes, RN Outcome: Progressing   Problem: Elimination: Goal: Will not experience complications related to bowel motility 10/25/2021 0325 by Jordan Likes, RN Outcome: Not Progressing Note: Per pt. She has not had BM in one week, was having some abdominal pain stating it felt as if she needs to have a BM. Provider notified orders given. 10/25/2021 0325 by Jordan Likes, RN Outcome: Progressing Goal: Will not experience complications related to urinary retention 10/25/2021 0325 by Jordan Likes, RN Outcome: Progressing 10/25/2021 0325 by Jordan Likes, RN Outcome: Progressing   Problem: Pain Managment: Goal: General experience of comfort will improve 10/25/2021 0325 by Jordan Likes, RN Outcome: Progressing 10/25/2021 0325 by Jordan Likes, RN Outcome: Progressing   Problem: Safety: Goal: Ability to remain free from injury will improve 10/25/2021 0325 by Jordan Likes, RN Outcome: Progressing 10/25/2021 0325 by Jordan Likes, RN Outcome: Progressing   Problem: Skin Integrity: Goal: Risk for impaired skin integrity will decrease 10/25/2021 0325 by Jordan Likes, RN Outcome: Progressing 10/25/2021 0325 by Jordan Likes, RN Outcome: Progressing   Problem: Education: Goal: Understanding of CV disease, CV risk reduction, and recovery process will improve 10/25/2021 0325 by Jordan Likes, RN Outcome: Progressing 10/25/2021 0325 by Jordan Likes, RN Outcome: Progressing Goal: Individualized Educational Video(s) 10/25/2021 0325 by Jordan Likes, RN Outcome: Progressing 10/25/2021 0325 by Jordan Likes, RN Outcome: Progressing   Problem: Activity: Goal: Ability to return to baseline activity level will improve 10/25/2021 0325 by Jordan Likes, RN Outcome: Progressing 10/25/2021 0325 by Jordan Likes, RN Outcome: Progressing   Problem:  Cardiovascular: Goal: Ability to achieve and maintain adequate cardiovascular perfusion will improve 10/25/2021 0325 by Jordan Likes, RN Outcome: Progressing 10/25/2021 0325 by Jordan Likes, RN Outcome: Progressing Goal: Vascular access site(s) Level 0-1 will be maintained  10/25/2021 0325 by Jordan Likes, RN Outcome: Progressing 10/25/2021 0325 by Jordan Likes, RN Outcome: Progressing   Problem: Health Behavior/Discharge Planning: Goal: Ability to safely manage health-related needs after discharge will improve 10/25/2021 0325 by Jordan Likes, RN Outcome: Progressing 10/25/2021 0325 by Jordan Likes, RN Outcome: Progressing

## 2021-10-25 NOTE — Progress Notes (Signed)
CT called nurse and stated that CT looks like patient LUE extravasated and to assess patient when she returns. Shortly after nurse called to room by transport. Upon assessing arm very swollen, hard to touch and painful. IV heparin and zosyn was still running. Nurse stopped all fluids, pulled remaining fluids from catheters, noted blood return from one. Warm compress applied and arm elevated. Provider notified and has stated she will be up to see patient. IV team nurse had previous consult in and entered room updated to situation and assessed arm. Per nurse patient needs to have dbl lumen picc and she noted one vein appropriate for this and will not access until the doctor determines what they would like for access. Waiting for MD to come to room now.

## 2021-10-25 NOTE — Plan of Care (Addendum)
Floor coverage note  Patient with continued abdominal pain s/p celiac stent  IV infiltrated per nurse and they asked whether IV could be held off for PICC line in the a.m. as it could not be done tonight.  I informed nurse that patient absolutely needs to get IV for uninterrupted heparin infusion  Call from [facility] radiology showing:  CTA chest abd and pelvis on 7/7  per verbal report  : Redemonstrated extensive mural thrombus throughout the thoracic and abdominal aorta .  The celiac artery again appears frankly excluded by expansile appearing acute appearing thrombus.  The SMA remains patent . new hypodense subcapsular lesion of the anterior lobe of the liver,  Hepatic segment #8.....Marland Kitchen concerning for hepatic infarction.   Discussed with Dr. Franchot Gallo via secure chat: She is just profoundly hypercoagulable. SMA remains patent so there is nothing to offer her. She is non-reconstructable. Perhaps the good news is her abrupt onset of pain is secondary to the hepatic infarct and she will get better with supportive care and time.  Dr Delana Meyer states that he has discussed patient with Dr Shelia Media (prior to report0  Addendum: I was made aware of official report by Dr. Virgina Jock that is now available and it shows acute cholecystitis likely secondary to ischemia.  We will contact on-call surgeon, Dr. Peyton Najjar to evaluate   Addendum: Discussed with Dr. Peyton Najjar via secure chat Due to risk factors and need to be on uninterrupted anticoagulation, preference for cholecystostomy  Acute cholecystitis, likely ischemic Hepatic infarct -IR consult for cholecystostomy in the a.m. of the recommendation of Dr. Peyton Najjar will see patient in the a.m. -N.p.o. -Started on Zosyn - Continue pain control - Continue heparin infusion with short interruption for procedure -Dr. Virgina Jock updated on plan with surgery -Discussed plan of care in detail with patient and she voiced understanding and agreement with plan.

## 2021-10-25 NOTE — Progress Notes (Addendum)
PROGRESS NOTE    Suzanne [name]  ATF:[id number] DOB: 04-16-1976 DOA: 10/22/2021 PCP: System, Provider Not In   Brief Narrative:   Suzanne [name] is a 46 y.o. 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 Eulogio Ditch) presented to hospital with severe abdominal pain with tenderness across the entire abdomen for few weeks that got worse for a few days prior to presentation.  Patient also had some bloody bowel movements and endorses postprandial pain. 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    During hospitalization, vascular surgery was consulted and patient was started on heparin drip.  Patient subsequently underwent mesenteric angiogram on 10/24/2021 with angioplasty.   Assessment & Plan:   Principal Problem:   Mesenteric ischemia (HCC) Active Problems:   Abdominal pain   PVD (peripheral vascular disease) (HCC)   Hypercoagulable state (HCC)   Leg pain, right   GERD (gastroesophageal reflux disease)   Alcohol abuse   Chronic pain   COPD (chronic obstructive pulmonary disease) (HCC)   Essential hypertension   Tobacco use   Hypokalemia   Assessment and Plan: *Abdominal pain/mesenteric ischemia (HCC) CT abd/pelvis with severe atherosclerotic plaque with marked noncalcified atheromatous plaque of the thoracic and abdominal aorta leading to diffuse stenosis with the infrarenal abdominal aortal down to a caliber of 0.7x1.2 cm.  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  Thin chronic thrombus string like thrombus within the origin of the superior mesenteric artery and left  subclavian artery.  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  Patient underwent mesenteric angiogram and percutaneous transluminal angioplasty of the celiac artery on 10/24/2021.   Of note patient had stopped anticoagulation 2 months ago.  She was emphasized the need for continuation of anticoagulation on discharge.  Currently on Zosyn due to concerns for mesenteric ischemia.  Patient still complains of abdominal pain but has been advanced on oral diet.  On IV morphine, IV Dilaudid and oxycodone for pain relief.  We will add a lactate.  Continue high intensity statins, Plavix.  Leukocytosis.  Could be reactive.  On Zosyn empirically.  WBC at 26.1 today from 21.2.  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  We will need to follow-up with hematology on discharge.  Patient was emphasized the need for remaining compliant with anticoagulation.  Might need assistance with medications on discharge.  Communicated with the pharmacy about it for Eliquis.  Currently on heparin drip and will switch to Eliquis if okay with vascular surgery.  PVD (peripheral vascular disease), right leg pain.  (HCC) ABI shows resting ABI in the mild range of occlusive disease on the left and moderate on the right  Daily alcohol abuse On Ativan protocol, continue CIWA monitoring.  Continue thiamine folic acid.  No signs of active withdrawal at this time.  GERD (gastroesophageal reflux disease) Continue Protonix twice daily changed to  oral form today  Acute on chronic pain, intractable abdominal pain Currently on IV Dilaudid, morphine and  oxycodone..  Essential hypertension Blood pressure is elevated likely secondary to pain.  On hydralazine as needed and Dilaudid IV for adequate pain relief.  We will add amlodipine due to persistently elevated blood pressure.  COPD (chronic obstructive pulmonary disease) (HCC) No shortness of breath or wheezing at this time.  Compensated.  Continue albuterol and Pulmicort  Hypokalemia.  Mild at 3.3 today.  We will replenish.  Magnesium of 1.8.  Check levels in AM.  Hyponatremia. Improved with hydration.  Tobacco use Continue nicotine patch.  DVT prophylaxis: heparin gtt  Code Status: full  Family Communication: none  Disposition: Home in 2 to 3 days,  Status is: Inpatient  Remains inpatient appropriate because:  vascular eval with angioplasty, continued abdominal pain on IV narcotics, IV heparin,    Consultants:  vascular surgery  Procedures:  Mesenteric angiogram with selective celiac artery angioplasty on 10/24/2021.  Antimicrobials:  Anti-infectives (From admission, onward)    Start     Dose/Rate Route Frequency Ordered Stop   10/24/21 1511  ceFAZolin (ANCEF) IVPB 1 g/50 mL premix        over 30 Minutes  Continuous PRN 10/24/21 1514 10/24/21 1756   10/24/21 1156  ceFAZolin (ANCEF) 2-4 GM/100ML-% IVPB       Note to Pharmacy: Suzanne [name]: cabinet override      10/24/21 1156 10/24/21 2359   10/24/21 1125  ceFAZolin (ANCEF) IVPB 2g/100 mL premix  Status:  Discontinued        2 g 200 mL/hr over 30 Minutes Intravenous 30 min pre-op 10/24/21 1125 10/24/21 1756   10/24/21 0600  ceFAZolin (ANCEF) IVPB 2g/100 mL premix  Status:  Discontinued        2 g 200 mL/hr over 30 Minutes Intravenous On call to O.R. 10/23/21 1548 10/24/21 2013   10/22/21 2200  piperacillin-tazobactam (ZOSYN) IVPB 3.375 g        3.375 g 12.5 mL/hr over 240 Minutes Intravenous Every 8 hours 10/22/21 2044        Subjective: Today, patient was seen and examined at bedside.  Still complains of  abdominal pain nausea and headache.    Objective: Vitals:   10/24/21 1754 10/24/21 2001 10/25/21 0242 10/25/21 0740  BP: (!) 180/98 (!) 152/83 (!) 148/82 (!) 161/72  Pulse: 72 70 80 80  Resp: '14 16 20 18  '$ Temp: 98.9 F (37.2 C) 98.3 F (36.8 C) 98.4 F (36.9 C) 98.1 F (36.7 C)  TempSrc:    Oral  SpO2: 97%  100% 93%  Weight:      Height:        Intake/Output Summary (Last 24 hours) at 10/25/2021 1209 Last data filed at 10/25/2021 1041 Gross per 24 hour  Intake 755.05 ml  Output 0 ml  Net 755.05 ml    Filed Weights   10/22/21 1407  Weight: 61.2 kg    Physical examination: Body mass index is 21.14 kg/m.  General:  Average built, not in obvious distress, alert awake Communicative HENT:   No scleral pallor or icterus noted. Oral mucosa is moist.  Chest:  Clear breath sounds.  Diminished breath sounds bilaterally. No crackles or wheezes.  CVS: S1 &S2 heard. No murmur.  Regular rate and rhythm. Abdomen: Diffuse tenderness over the abdomen on palpation, Extremities: No cyanosis, clubbing or edema.  Peripheral pulses are palpable. Psych: Alert, awake and oriented, low mood, CNS:  No cranial  nerve deficits.  Power equal in all extremities.   Skin: Warm and dry.  No rashes noted.   Data Reviewed: I have personally reviewed following labs and imaging studies  CBC: Recent Labs  Lab 10/22/21 1417 10/23/21 0108 10/24/21 0426 10/25/21 0312  WBC 14.7* 16.0* 21.2* 26.1*  NEUTROABS  --   --  16.6*  --   HGB 10.6* 9.9* 9.6* 8.9*  HCT 34.6* 32.4* 31.1* 28.6*  MCV 74.6* 75.2* 74.4* 74.5*  PLT 784* 718* 764* 614*    Basic Metabolic Panel: Recent Labs  Lab 10/22/21 1417 10/23/21 0108 10/24/21 0426 10/25/21 0312  NA 135 139 133* 132*  K 3.8 3.6 3.4* 3.3*  CL 103 107 101 101  CO2 21* '23 24 25  '$ GLUCOSE 121* 119* 125* 128*  BUN '6 7 8 6  '$ CREATININE 0.82 0.79 0.91 0.91  CALCIUM 9.1 8.8* 8.7* 8.2*  MG  --   --  1.9 1.8  PHOS  --   --  3.1  --     GFR: Estimated  Creatinine Clearance: 76.2 mL/min (by C-G formula based on SCr of 0.91 mg/dL).  Liver Function Tests: Recent Labs  Lab 10/22/21 1409 10/24/21 0426  AST 14* 28  ALT 9 17  ALKPHOS 83 79  BILITOT 0.5 0.6  PROT 7.7 7.2  ALBUMIN 3.2* 2.9*    CBG: No results for input(s): "GLUCAP" in the last 168 hours.   No results found for this or any previous visit (from the past 240 hour(s)).   Radiology Studies: PERIPHERAL VASCULAR CATHETERIZATION  Result Date: 10/24/2021 See surgical note for result.  US ARTERIAL ABI (SCREENING LOWER EXTREMITY)  Result Date: 10/24/2021 CLINICAL DATA:  46 year old female with history of mesenteric ischemia, prior thromboembolic disease EXAM: NONINVASIVE PHYSIOLOGIC VASCULAR STUDY OF BILATERAL LOWER EXTREMITIES TECHNIQUE: Evaluation of both lower extremities was performed at rest, including calculation of ankle-brachial indices, multiple segmental pressure evaluation, segmental Doppler and segmental pulse volume recording. COMPARISON:  None Available. FINDINGS: Right ABI:  0.43 Left ABI:  0.78 Right Lower Extremity: Segmental Doppler at the right ankle demonstrates flat line of the posterior tibial artery and monophasic dorsalis pedis. Left Lower Extremity: Segmental Doppler at the left ankle demonstrates multiphasic waveforms. IMPRESSION: Right: Resting ABI in the moderate range of arterial occlusive disease and would be expected to decrease after exercise exam. Segmental exam at the ankle demonstrates more proximal occlusive disease. Left: Resting ABI in the mild range of arterial occlusive disease. This might decrease after exercise exam. Segmental exam at the ankle demonstrates waveforms relatively maintained. Signed, Dulcy Fanny. Nadene Rubins, RPVI [facility] Radiology Electronically Signed   By: Corrie Mckusick D.O.   On: 10/24/2021 08:26    Scheduled Meds:  atorvastatin  80 mg Oral Daily   clopidogrel  75 mg Oral Q  breakfast   folic acid  1 mg Oral Daily   multivitamin with minerals  1 tablet Oral Daily   nicotine  21 mg Transdermal Daily   pantoprazole  40 mg Oral BID   sodium chloride flush  3 mL Intravenous Q12H   thiamine  100 mg Oral Daily   Or   thiamine  100 mg Intravenous Daily   Continuous Infusions:  sodium chloride     heparin 1,350 Units/hr (10/25/21 0353)   piperacillin-tazobactam (ZOSYN)  IV 3.375 g (10/25/21 0745)     LOS: 3 days   Flora Lipps, MD [facility] 10/25/2021, 12:09 PM

## 2021-10-25 NOTE — Consult Note (Signed)
ANTICOAGULATION CONSULT NOTE - Follow Up Consult  Pharmacy Consult for Heparin gtt Indication:  aortic thrombus/MI  No Known Allergies  Patient Measurements: Height: '5\' 7"'$  (170.2 cm) Weight: 61.2 kg (135 lb) IBW/kg (Calculated) : 61.6 Heparin Dosing Weight: 61.2kg  Vital Signs: Temp: 98.4 F (36.9 C) (07/07 0242) BP: 148/82 (07/07 0242) Pulse Rate: 80 (07/07 0242)  Labs: Recent Labs    10/22/21 1417 10/22/21 1417 10/22/21 1823 10/23/21 0108 10/23/21 0702 10/23/21 2309 10/24/21 0426 10/25/21 0312  HGB 10.6*  --   --  9.9*  --   --  9.6* 8.9*  HCT 34.6*  --   --  32.4*  --   --  31.1* 28.6*  PLT 784*  --   --  718*  --   --  764* 614*  APTT  --   --  30 55*  --   --   --   --   LABPROT  --   --  14.0  --   --   --   --   --   INR  --   --  1.1  --   --   --   --   --   HEPARINUNFRC  --    < > <0.10* 0.33   < > 0.45 0.35 0.24*  CREATININE 0.82  --   --  0.79  --   --  0.91 0.91  TROPONINIHS 7  --  9  --   --   --   --   --    < > = values in this interval not displayed.   Heparin Dosing Weight: 61.2kg  Estimated Creatinine Clearance: 76.2 mL/min (by C-G formula based on SCr of 0.91 mg/dL).  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 Pharmacy consulted for mgmt of heparin gtt.  Date Time aPTT/HL Rate/Comment 7/5 0702 HL 0.22 SUBtherapeutic @ 1100 units/hr     7/5 1714 HL 0.45 Therapeutic x1 @ 1250 units/hr 7/6      0426    HL 0.35           Therapeutic X 3 @ 1250 units/hr 7/6 1246 - 2100  HEPARIN STOPPED FOR PROCEDURE 7/7      0312    HL 0.24           Subtherapeutic  Baseline Labs: aPTT - pending INR - pending HL - pending Hgb -  10.6 Plts - 784  Goal of Therapy:  Heparin level 0.3-0.7 units/ml aPTT 66-102 seconds Monitor platelets by anticoagulation protocol: Yes   Plan:  7/7:  HL @ 0312 = 0.24, SUBtherapeutic  Will order heparin 900 units IV X 1 bolus and increase drip rate to 1350 units/hr.  Will recheck HL 6 hrs after rate change.    ROrene Desanctis PharmD Clinical Pharmacist   10/25/2021 3:50 AM

## 2021-10-25 NOTE — Progress Notes (Addendum)
RN called to room with pt c/o 10/10 abdominal pain and mid upper back pain. Pain meds given with no relief. MD notified. MD came to bedside to assess. Orders placed.

## 2021-10-25 NOTE — Progress Notes (Signed)
Nursing staff called in for patient having increasing abdominal pain.  Abdominal x-ray ordered with nonspecific bowel gas pattern.  Lactate initial was negative but second set elevated at 2.1.  Patient continues to have intractable abdominal pain and was restless and tachycardic.  Communicated with vascular surgery.  Patient does have extensive vascular disease not amenable to reconstruction.  On heparin drip, Plavix at this time and is on Dilaudid and oxycodone plus morphine for pain.  We will consult GI for second set of eyes to see if anything can be done.  We will get a CT scan of the chest abdomen with contrast to assess for other nonvascular causes of pain.  We will also add on a lipase.  Keep n.p.o., continue IV fluids.  Trend lactate.  Will change the pain medication regimen to Dilaudid drip at this time. Communicated with the nursing staff and the team.  It appears that the patient's prognosis is poor/guarded.  I tried to reach the patient's contact person to update but could not reach on the number listed.

## 2021-10-25 NOTE — Progress Notes (Signed)
[facility]  Daily Progress Note   Subjective  - 1 Day Post-Op  Patient sitting up in bed very nauseated I witnessed 1 emesis she is also complaining about severe pain both in the epigastrium as well as in her back.  Objective Vitals:   10/24/21 2001 10/25/21 0242 10/25/21 0740 10/25/21 1650  BP: (!) 152/83 (!) 148/82 (!) 161/72 (!) 182/70  Pulse: 70 80 80   Resp: '16 20 18   '$ Temp: 98.3 F (36.8 C) 98.4 F (36.9 C) 98.1 F (36.7 C) 98.7 F (37.1 C)  TempSrc:   Oral Oral  SpO2:  100% 93%   Weight:      Height:        Intake/Output Summary (Last 24 hours) at 10/25/2021 1939 Last data filed at 10/25/2021 1041 Gross per 24 hour  Intake 120 ml  Output 0 ml  Net 120 ml    PULM  Normal effort , no use of accessory muscles CV  No JVD, RRR Abd      No distended, nontender VASC  groin site clean dry and intact  Laboratory CBC    Component Value Date/Time   WBC 26.1 (H) 10/25/2021 0312   HGB 8.9 (L) 10/25/2021 0312   HGB 17.1 (H) 04/23/2011 1538   HCT 28.6 (L) 10/25/2021 0312   HCT 50.5 (H) 04/23/2011 1538   PLT 614 (H) 10/25/2021 0312   PLT 419 04/23/2011 1538    BMET    Component Value Date/Time   NA 132 (L) 10/25/2021 0312   NA 140 04/23/2011 1538   K 3.3 (L) 10/25/2021 0312   K 3.9 04/23/2011 1538   CL 101 10/25/2021 0312   CL 105 04/23/2011 1538   CO2 25 10/25/2021 0312   CO2 25 04/23/2011 1538   GLUCOSE 128 (H) 10/25/2021 0312   GLUCOSE 117 (H) 04/23/2011 1538   BUN 6 10/25/2021 0312   BUN 2 (L) 04/23/2011 1538   CREATININE 0.91 10/25/2021 0312   CREATININE 0.63 04/23/2011 1538   CALCIUM 8.2 (L) 10/25/2021 0312   CALCIUM 8.9 04/23/2011 1538   GFRNONAA >60 10/25/2021 0312   GFRNONAA >60 04/23/2011 1538   GFRAA >60 12/22/2018 0839   GFRAA >60 04/23/2011 1538    Assessment/Planning: POD #1 s/p mesenteric angiography with angioplasty  This is a very difficult situation.  She appears to be continuing to have mesenteric ischemic  symptoms in spite of the angioplasty and in review of the angiogram itself there are no options for further intervention or surgeries.  Sadly, this seems to be the same process that she experienced with her arm and this ultimately led to amputation.  Unfortunately not unreconstructable mesenteric ischemia portends a very poor prognosis.  We have ordered a CT angiogram of the chest abdomen and pelvis to assess if there is any change that would be amenable to intervention perhaps she has progressed with her SMA after the angiogram yesterday although I believe this is very unlikely.  We are working on parenteral narcotics for pain management and I have added Phenergan to the Zofran to try to help with her intractable nausea.   Hortencia Pilar  10/25/2021, 7:39 PM

## 2021-10-25 NOTE — Progress Notes (Signed)
Came in to to start PIV as ordered, upon assessment , pt.'s Whole Left forearm swollen ,extending to left  lower medial area of upper arm.RN reported that it happened while in CT scan. Unable to find a suitable vein for PIV at this time. Pt. Is getting multiple IV meds and needing at least two PIVs. Recommended for possible DL PICC. RN to notify MD. Pt..  has right arm amputation.

## 2021-10-25 NOTE — Consult Note (Signed)
ANTICOAGULATION CONSULT NOTE - Follow Up Consult  Pharmacy Consult for Heparin gtt Indication:  aortic thrombus/MI  No Known Allergies  Patient Measurements: Height: '5\' 7"'$  (170.2 cm) Weight: 61.2 kg (135 lb) IBW/kg (Calculated) : 61.6 Heparin Dosing Weight: 61.2kg  Vital Signs: Temp: 98.1 F (36.7 C) (07/07 0740) Temp Source: Oral (07/07 0740) BP: 161/72 (07/07 0740) Pulse Rate: 80 (07/07 0740)  Labs: Recent Labs    10/22/21 1823 10/22/21 1823 10/23/21 0108 10/23/21 0702 10/24/21 0426 10/25/21 0312 10/25/21 1039 10/25/21 1317  HGB  --    < > 9.9*  --  9.6* 8.9*  --   --   HCT  --   --  32.4*  --  31.1* 28.6*  --   --   PLT  --   --  718*  --  764* 614*  --   --   APTT 30  --  55*  --   --   --   --   --   LABPROT 14.0  --   --   --   --   --   --   --   INR 1.1  --   --   --   --   --   --   --   HEPARINUNFRC <0.10*  --  0.33   < > 0.35 0.24* <0.10* <0.10*  CREATININE  --   --  0.79  --  0.91 0.91  --   --   TROPONINIHS 9  --   --   --   --   --   --   --    < > = values in this interval not displayed.   Heparin Dosing Weight: 61.2kg  Estimated Creatinine Clearance: 76.2 mL/min (by C-G formula based on SCr of 0.91 mg/dL).  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 Pharmacy consulted for mgmt of heparin gtt.  Date Time aPTT/HL Rate/Comment 7/5 0702 HL 0.22 SUBtherapeutic @ 1100 units/hr     7/5 1714 HL 0.45 Therapeutic x1 @ 1250 units/hr 7/6      0426    HL 0.35           Therapeutic X 3 @ 1250 units/hr 7/6 1246 - 2100  HEPARIN STOPPED FOR PROCEDURE 7/7      0312    HL 0.24           Subtherapeutic 7/7 1039 HL <  0.1 Subtherapeutic 7/7 1330 HL < 0.1 (repeat)Subtherapeutic  Baseline Labs: aPTT - pending INR - pending HL - pending Hgb - 10.6 Plts - 784  Goal of Therapy:  Heparin level 0.3-0.7 units/ml aPTT 66-102 seconds Monitor platelets by anticoagulation protocol: Yes   Plan: HL subtherapeutic. Spoke with RN- heparin is running, no issues with infusion, and no bruising at site. Called lab and ordered repeat, which was still < 0.1. Will dose per nomogram. Give heparin bolus 1800 units. Increase heparin infusion to 1400 units/hr. Will recheck HL 6 hrs after rate change.    CWynelle Cleveland PharmD Clinical Pharmacist   10/25/2021 2:52 PM

## 2021-10-26 ENCOUNTER — Inpatient Hospital Stay: Payer: Medicaid Other | Admitting: Radiology

## 2021-10-26 ENCOUNTER — Inpatient Hospital Stay: Payer: Self-pay

## 2021-10-26 DIAGNOSIS — K559 Vascular disorder of intestine, unspecified: Secondary | ICD-10-CM | POA: Diagnosis not present

## 2021-10-26 DIAGNOSIS — I4729 Other ventricular tachycardia: Secondary | ICD-10-CM

## 2021-10-26 HISTORY — PX: IR PERC CHOLECYSTOSTOMY: IMG2326

## 2021-10-26 LAB — CBC
HCT: 30.1 % — ABNORMAL LOW (ref 36.0–46.0)
Hemoglobin: 9.1 g/dL — ABNORMAL LOW (ref 12.0–15.0)
MCH: 22.6 pg — ABNORMAL LOW (ref 26.0–34.0)
MCHC: 30.2 g/dL (ref 30.0–36.0)
MCV: 74.9 fL — ABNORMAL LOW (ref 80.0–100.0)
Platelets: 709 10*3/uL — ABNORMAL HIGH (ref 150–400)
RBC: 4.02 MIL/uL (ref 3.87–5.11)
RDW: 19 % — ABNORMAL HIGH (ref 11.5–15.5)
WBC: 27.1 10*3/uL — ABNORMAL HIGH (ref 4.0–10.5)
nRBC: 0 % (ref 0.0–0.2)

## 2021-10-26 LAB — BASIC METABOLIC PANEL
Anion gap: 9 (ref 5–15)
Anion gap: 8 (ref 5–15)
BUN: 8 mg/dL (ref 6–20)
BUN: 7 mg/dL (ref 6–20)
CO2: 22 mmol/L (ref 22–32)
CO2: 23 mmol/L (ref 22–32)
Calcium: 8.5 mg/dL — ABNORMAL LOW (ref 8.9–10.3)
Calcium: 8.1 mg/dL — ABNORMAL LOW (ref 8.9–10.3)
Chloride: 103 mmol/L (ref 98–111)
Chloride: 102 mmol/L (ref 98–111)
Creatinine, Ser: 0.92 mg/dL (ref 0.44–1.00)
Creatinine, Ser: 0.89 mg/dL (ref 0.44–1.00)
GFR, Estimated: >60 mL/min (ref >60)
GFR, Estimated: >60 mL/min (ref >60)
Glucose, Bld: 103 mg/dL — ABNORMAL HIGH (ref 70–99)
Glucose, Bld: 110 mg/dL — ABNORMAL HIGH (ref 70–99)
Potassium: 3.7 mmol/L (ref 3.5–5.1)
Potassium: 3.6 mmol/L (ref 3.5–5.1)
Sodium: 134 mmol/L — ABNORMAL LOW (ref 135–145)
Sodium: 133 mmol/L — ABNORMAL LOW (ref 135–145)

## 2021-10-26 LAB — MAGNESIUM
Magnesium: 2.3 mg/dL (ref 1.7–2.4)
Magnesium: 2.2 mg/dL (ref 1.7–2.4)

## 2021-10-26 LAB — HEPARIN LEVEL (UNFRACTIONATED)
Heparin Unfractionated: 0.64 IU/mL (ref 0.30–0.70)
Heparin Unfractionated: <0.1 IU/mL — ABNORMAL LOW (ref 0.30–0.70)
Heparin Unfractionated: 0.42 IU/mL (ref 0.30–0.70)

## 2021-10-26 MED ORDER — BISACODYL 10 MG RE SUPP
10.0000 mg | Freq: Once | RECTAL | Status: AC
  Administered 2021-10-26: 10 mg via RECTAL
  Filled 2021-10-26: qty 1

## 2021-10-26 MED ORDER — DEXTROSE IN LACTATED RINGERS 5 % IV SOLN: INTRAVENOUS | Status: DC

## 2021-10-26 MED ORDER — MIDAZOLAM HCL 2 MG/2ML IJ SOLN
INTRAMUSCULAR | Status: AC | PRN
  Administered 2021-10-26: 1 mg via INTRAVENOUS

## 2021-10-26 MED ORDER — HEPARIN BOLUS VIA INFUSION
900.0000 [IU] | Freq: Once | INTRAVENOUS | Status: AC
  Administered 2021-10-26: 900 [IU] via INTRAVENOUS
  Filled 2021-10-26: qty 900

## 2021-10-26 MED ORDER — SODIUM CHLORIDE 0.9% FLUSH
5.0000 mL | Freq: Three times a day (TID) | INTRAVENOUS | Status: DC
  Administered 2021-10-26 – 2021-10-30 (×11): 5 mL

## 2021-10-26 MED ORDER — LIDOCAINE HCL 1 % IJ SOLN
INTRAMUSCULAR | Status: AC
  Administered 2021-10-26: 20 mL
  Filled 2021-10-26: qty 20

## 2021-10-26 MED ORDER — POLYETHYLENE GLYCOL 3350 17 G PO PACK
17.0000 g | PACK | Freq: Two times a day (BID) | ORAL | Status: DC
  Administered 2021-10-26 – 2021-10-30 (×4): 17 g via ORAL
  Filled 2021-10-26 (×8): qty 1

## 2021-10-26 MED ORDER — DOCUSATE SODIUM 100 MG PO CAPS
100.0000 mg | ORAL_CAPSULE | Freq: Two times a day (BID) | ORAL | Status: DC
  Administered 2021-10-26 – 2021-10-30 (×5): 100 mg via ORAL
  Filled 2021-10-26 (×8): qty 1

## 2021-10-26 MED ORDER — FENTANYL CITRATE (PF) 100 MCG/2ML IJ SOLN
INTRAMUSCULAR | Status: AC | PRN
  Administered 2021-10-26 (×2): 50 ug via INTRAVENOUS

## 2021-10-26 MED ORDER — IOHEXOL 350 MG/ML SOLN
8.0000 mL | Freq: Once | INTRAVENOUS | Status: AC | PRN
  Administered 2021-10-26: 8 mL

## 2021-10-26 MED ORDER — LIDOCAINE HCL (PF) 1 % IJ SOLN
INTRAMUSCULAR | Status: DC | PRN
  Administered 2021-10-26: 10 mL

## 2021-10-26 NOTE — Progress Notes (Signed)
IR aware of request for percutaneous cholecystostomy placement - plan for procedure today with Dr. Pascal Lux.  Patient to remain NPO until post procedure, IR will call for patient when ready.  Please call with questions or concerns.  Candiss Norse, PA-C

## 2021-10-26 NOTE — Plan of Care (Signed)
Floor coverage note  Patient had 2 short runs of nonsustained V. tach of 5 and 6 beats with quick return to normal sinus rhythm.  She was asymptomatic  NSVT - Will check potassium and magnesium and replete as needed to keep potassium over 4 and magnesium over 2 - Continue cardiac monitoring -Likely related to acute medical problems but can consider cardiology consult in the a.m. and work-up to include echocardiogram

## 2021-10-26 NOTE — Procedures (Signed)
Pre procedural Diagnosis: Poor venous access Post Procedural Diagnosis: Same  Successful placement of left brachial vein approach triple lumen PICC line with tip at the superior caval-atrial junction.    EBL: None  No immediate post procedural complication.  The PICC line is ready for immediate use.  Ronny Bacon, MD Pager #: [phone]

## 2021-10-26 NOTE — Plan of Care (Signed)
  Problem: Education: Goal: Knowledge of General Education information will improve Description: Including pain rating scale, medication(s)/side effects and non-pharmacologic comfort measures Outcome: Progressing   Problem: Health Behavior/Discharge Planning: Goal: Ability to manage health-related needs will improve Outcome: Progressing   Problem: Clinical Measurements: Goal: Ability to maintain clinical measurements within normal limits will improve Outcome: Progressing Goal: Will remain free from infection Outcome: Progressing Goal: Diagnostic test results will improve Outcome: Not Progressing Note: Patient had more abnormal diagnostic imaging in which provider has spoken with her about refer to chat notes.CT scan Goal: Respiratory complications will improve Outcome: Progressing Goal: Cardiovascular complication will be avoided Outcome: Progressing   Problem: Activity: Goal: Risk for activity intolerance will decrease Outcome: Progressing   Problem: Nutrition: Goal: Adequate nutrition will be maintained Outcome: Progressing   Problem: Coping: Goal: Level of anxiety will decrease Outcome: Progressing   Problem: Elimination: Goal: Will not experience complications related to bowel motility Outcome: Progressing Goal: Will not experience complications related to urinary retention Outcome: Progressing   Problem: Pain Managment: Goal: General experience of comfort will improve Outcome: Progressing   Problem: Safety: Goal: Ability to remain free from injury will improve Outcome: Progressing   Problem: Skin Integrity: Goal: Risk for impaired skin integrity will decrease Outcome: Progressing   Problem: Education: Goal: Understanding of CV disease, CV risk reduction, and recovery process will improve Outcome: Progressing Goal: Individualized Educational Video(s) Outcome: Progressing   Problem: Activity: Goal: Ability to return to baseline activity level will  improve Outcome: Progressing   Problem: Cardiovascular: Goal: Ability to achieve and maintain adequate cardiovascular perfusion will improve Outcome: Progressing Goal: Vascular access site(s) Level 0-1 will be maintained Outcome: Progressing   Problem: Health Behavior/Discharge Planning: Goal: Ability to safely manage health-related needs after discharge will improve Outcome: Progressing

## 2021-10-26 NOTE — Consult Note (Signed)
ANTICOAGULATION CONSULT NOTE - Follow Up Consult  Pharmacy Consult for Heparin gtt Indication:  aortic thrombus/MI  No Known Allergies  Patient Measurements: Height: '5\' 7"'$  (170.2 cm) Weight: 61.2 kg (135 lb) IBW/kg (Calculated) : 61.6 Heparin Dosing Weight: 61.2kg  Vital Signs: Temp: 98.2 F (36.8 C) (07/08 0805) BP: 161/99 (07/08 0805) Pulse Rate: 82 (07/08 0805)  Labs: Recent Labs    10/24/21 0426 10/25/21 0312 10/25/21 1039 10/25/21 1317 10/25/21 2306 10/26/21 0701  HGB 9.6* 8.9*  --   --   --  9.1*  HCT 31.1* 28.6*  --   --   --  30.1*  PLT 764* 614*  --   --   --  709*  HEPARINUNFRC 0.35 0.24*   < > <0.10* 0.28* 0.64  CREATININE 0.91 0.91  --   --   --  0.92   < > = values in this interval not displayed.   Heparin Dosing Weight: 61.2kg  Estimated Creatinine Clearance: 75.4 mL/min (by C-G formula based on SCr of 0.92 mg/dL).  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 Pharmacy consulted for mgmt of heparin gtt.  Date Time aPTT/HL Rate/Comment 7/5 0702 HL 0.22 SUBtherapeutic @ 1100 units/hr     7/5 1714 HL 0.45 Therapeutic x1 @ 1250 units/hr 7/6      0426    HL 0.35           Therapeutic X 3 @ 1250 units/hr 7/6 1246 - 2100  HEPARIN STOPPED FOR PROCEDURE 7/7      0312    HL 0.24           Subtherapeutic 7/7 1039 HL < 0.1 Subtherapeutic 7/7 1330 HL < 0.1 (repeat)Subtherapeutic 7/7      2306    HL =  0.28       Subtherapeutic   Baseline Labs: aPTT - pending INR - pending HL - pending Hgb - 10.6 Plts - 784  Goal of Therapy:  Heparin level 0.3-0.7 units/ml aPTT 66-102 seconds Monitor platelets by anticoagulation protocol:  Yes   Plan:  7/8:  HL @ 0701 = 0.64, therapeutic x1 Continue heparin drip rate at 1500 units/hr. Will check confirmatory HL in 6 hours   WPearla Dubonnet PharmD Clinical Pharmacist   10/26/2021 8:19 AM

## 2021-10-26 NOTE — Progress Notes (Signed)
Progress Note Patient: Suzanne [name] WJX:[id number] DOB: 1976-04-08 DOA: 10/22/2021  DOS: the patient was seen and examined on 10/26/2021  Brief hospital course: MAGDELINE PRANGE is a 46 y.o. female with medical history significant of hyperlipidemia, liver cirrhosis, COPD, multiple DVTs, (non-adherent to Eliquis), severe peripheral vascular disease with right upper extremity ischemia s/p 08/17/20 angioplasty (followed by Dr. Eulogio Ditch) who presented to the ED with severe fluctuating abdominal pain with tenderness to palpation across the entire upper abdomen that started a few weeks ago but has been getting worse over the last few days.  The abdominal pain is associated with dark bloody bowel movements.  She also endorses post-prandial pain over the last few weeks.  Blood pressure and heart rate are stable but become elevated when patient is in pain and anxious.  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  Ms. Cadmus is very anxious and requests extra pain medications throughout the night.  She denies any fevers, chills, chest pain, shortness of breath, or urinary symptoms.  Currently she's on heparin gtt.  Vascular following.   Assessment and Plan: Peripheral vascular disease. Mesenteric ischemia. Hepatic infarction. Patient presents with complaints of abdominal pain. CT abdomen pelvis shows severe atherosclerotic plaque as well as noncalcified atheromatous plaque of the thoracic and abdominal aorta with chronic thrombus. Vascular surgery was consulted. Underwent angiogram. 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 Vascular surgery felt that the patient is not a candidate for any intervention including surgery for reconstruction. This leaves the patient with a very high risk for poor  outcome in future. Palliative care consulted. Currently on IV morphine PCA for pain control. Also on IV heparin. Continue Plavix. Monitor.  Abdominal pain appears to be multifactorial. Patient also has constipation which we will treat aggressively.  Acalculous cholecystitis. Blood cultures performed on 7/8. Currently on IV Zosyn. General surgery consulted not a candidate for any surgical intervention. IR consulted for percutaneous placement of cholecystostomy drain.  Noncompliance. Patient refusing to remain n.p.o. despite reporting severe abdominal pain, obstipation as well as nausea. Patient has been instructed clearly that noncompliance with diet will only jeopardize her recovery.  Alcohol abuse. Patient was on CIWA protocol. Currently out of the window.. Monitor.  Amputation of the right upper extremity. Prior history. Monitor.  Hypercoagulable state with history of splenic and renal infarct in 2016. Aortic thrombus in 2016. DVT in 2020. Right lower extremity ischemia 2022. Patient will require anticoagulation on discharge currently on IV heparin. Etiology of her hypercoagulable state is still not clear.  COPD. Does not appear to have any acute abnormality.  Monitor.  Hypokalemia. Hyponatremia. Replaced.  Goals of care conversation. Per my conversation with vascular surgery patient is a poor candidate for any reconstructive surgeries. Patient appears to have extensive vascular disease and not a candidate for any intervention as well. Per general surgery patient is not a candidate for any intervention from their point of view as well. Patient will continue to have chronic pain as well as limited prognosis. Palliative care consult for further goals of care conversation.  Subjective: Reports abdominal pain.  10 out of 10.  Also reports migraine headache.  Reports nausea no vomiting.  Not passing any gas.  No BM since last 2 weeks.  No fever so far.  Physical  Exam: Vitals:   10/26/21  1257 10/26/21 1315 10/26/21 1330 10/26/21 1538  BP: (!) 142/82 128/68 137/85 (!) 148/91  Pulse: 85 76 80 82  Resp: (!) '26 13 15 18  '$ Temp:    98.7 F (37.1 C)  TempSrc:      SpO2: 98% 95% 97% 97%  Weight:      Height:       General: Appear in marked distress; no visible Abnormal Neck Mass Or lumps, Conjunctiva normal Cardiovascular: S1 and S2 Present, no Murmur, Respiratory: increased respiratory effort, Bilateral Air entry present and CTA, no Crackles, no wheezes Abdomen: Bowel Sound present, diffusely tender Extremities: no Pedal edema Neurology: alert and oriented to time, place, and person  Gait not checked due to patient safety concerns   Data Reviewed: I have Reviewed nursing notes, Vitals, and Lab results since pt's last encounter. Pertinent lab results CBC and BMP I have ordered test including CBC and BMP I have discussed pt's care plan and test results with vascular surgery as well as palliative care.   Family Communication: Significant other at bedside.  Patient patient understands the plan.  Disposition: Status is: Inpatient Remains inpatient appropriate because: Currently on IV therapies.  Severe abdominal pain and mesenteric ischemia as well as cholecystitis.  Author: Berle Mull, MD 10/26/2021 6:40 PM  Please look on [email or web address] to find out who is on call.

## 2021-10-26 NOTE — Progress Notes (Signed)
Spoke with Dr Posey Pronto on unit re concerns for PICC placement in left arm.  Secure chat from Dr Posey Pronto stating IR is to place PICC in while pt in department.

## 2021-10-26 NOTE — Progress Notes (Signed)
AT bedside for PICC placement.  Pt currently in IR per [facility].

## 2021-10-26 NOTE — Consult Note (Signed)
ANTICOAGULATION CONSULT NOTE - Follow Up Consult  Pharmacy Consult for Heparin gtt Indication:  aortic thrombus/MI  No Known Allergies  Patient Measurements: Height: '5\' 7"'$  (170.2 cm) Weight: 61.2 kg (135 lb) IBW/kg (Calculated) : 61.6 Heparin Dosing Weight: 61.2kg  Vital Signs: Temp: 99.1 F (37.3 C) (07/08 2218) Temp Source: Oral (07/08 2218) BP: 155/75 (07/08 2218) Pulse Rate: 83 (07/08 2218)  Labs: Recent Labs    10/24/21 0426 10/25/21 0312 10/25/21 1039 10/26/21 0701 10/26/21 1458 10/26/21 2240  HGB 9.6* 8.9*  --  9.1*  --   --   HCT 31.1* 28.6*  --  30.1*  --   --   PLT 764* 614*  --  709*  --   --   HEPARINUNFRC 0.35 0.24*   < > 0.64 <0.10* 0.42  CREATININE 0.91 0.91  --  0.92  --  0.89   < > = values in this interval not displayed.   Heparin Dosing Weight: 61.2kg  Estimated Creatinine Clearance: 77.9 mL/min (by C-G formula based on SCr of 0.89 mg/dL).  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 Pharmacy consulted for mgmt of heparin gtt.  Date Time aPTT/HL Rate/Comment 7/5 0702 HL 0.22 SUBtherapeutic @ 1100 units/hr     7/5 1714 HL 0.45 Therapeutic x1 @ 1250 units/hr 7/6      0426    HL 0.35           Therapeutic X 3 @ 1250 units/hr 7/6 1246 - 2100  HEPARIN STOPPED FOR PROCEDURE 7/7      0312    HL 0.24           Subtherapeutic 7/7 1039 HL < 0.1 Subtherapeutic 7/7 1330 HL < 0.1 (repeat)Subtherapeutic 7/7      2306    HL =  0.28       Subtherapeutic  7/8      0701    HL =  0.64       Therapeutic x 1 7/8      1458    HL < 0.1      Heparin held for procedure 7/8      2240    HL = 0.42        Therapeutic X 1   Baseline  Labs: aPTT - pending INR - pending HL - pending Hgb - 10.6 Plts - 784  Goal of Therapy:  Heparin level 0.3-0.7 units/ml aPTT 66-102 seconds Monitor platelets by anticoagulation protocol: Yes   Plan:  7/8:  HL @ 2240 = 0.42, therapeutic X 1 Will continue pt on current rate and recheck HL on 7/9 @ 0500.   Christohper Dube D 10/26/2021 11:18 PM

## 2021-10-26 NOTE — Progress Notes (Signed)
Patient has not required additional pain meds since PCA started. Patient has IV to upper left arm that is running per IV nurse the vein is very small and may not last with the amount of meds and fluids that are ordered to run through it. Per IV team nurse she saved the vein that she visualized earlier on shift for picc line if ordered later. Patient has requested nausea medsx1 on shift. Patient has been NPO and aware of plan for having IR intervention this morning. Arm continues to have swelling from infiltration, but has decreased in size. Nurse assured arm remained elevated on pillows and applied warm compresses x3 on shift. Patient has since asked to remove warm compresses from arm, nurse educated patient on why compresses were needed for infiltrated arm, patient verbalized understanding and warm compressed were removed from extremity.

## 2021-10-26 NOTE — Consult Note (Signed)
ANTICOAGULATION CONSULT NOTE - Follow Up Consult  Pharmacy Consult for Heparin gtt Indication:  aortic thrombus/MI  No Known Allergies  Patient Measurements: Height: '5\' 7"'$  (170.2 cm) Weight: 61.2 kg (135 lb) IBW/kg (Calculated) : 61.6 Heparin Dosing Weight: 61.2kg  Vital Signs: Temp: 97.9 F (36.6 C) (07/07 2007) Temp Source: Oral (07/07 1650) BP: 150/97 (07/07 2007) Pulse Rate: 81 (07/07 2007)  Labs: Recent Labs    10/23/21 0108 10/23/21 0702 10/24/21 0426 10/25/21 0312 10/25/21 1039 10/25/21 1317 10/25/21 2306  HGB 9.9*  --  9.6* 8.9*  --   --   --   HCT 32.4*  --  31.1* 28.6*  --   --   --   PLT 718*  --  764* 614*  --   --   --   APTT 55*  --   --   --   --   --   --   HEPARINUNFRC 0.33   < > 0.35 0.24* <0.10* <0.10* 0.28*  CREATININE 0.79  --  0.91 0.91  --   --   --    < > = values in this interval not displayed.   Heparin Dosing Weight: 61.2kg  Estimated Creatinine Clearance: 76.2 mL/min (by C-G formula based on SCr of 0.91 mg/dL).  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 Pharmacy consulted for mgmt of heparin gtt.  Date Time aPTT/HL Rate/Comment 7/5 0702 HL 0.22 SUBtherapeutic @ 1100 units/hr     7/5 1714 HL 0.45 Therapeutic x1 @ 1250 units/hr 7/6      0426    HL 0.35           Therapeutic X 3 @ 1250 units/hr 7/6 1246 - 2100  HEPARIN STOPPED FOR PROCEDURE 7/7      0312    HL 0.24           Subtherapeutic 7/7 1039 HL < 0.1 Subtherapeutic 7/7 1330 HL < 0.1 (repeat)Subtherapeutic 7/7      2306    HL =  0.28       Subtherapeutic   Baseline Labs: aPTT - pending INR - pending HL - pending Hgb - 10.6 Plts - 784  Goal of  Therapy:  Heparin level 0.3-0.7 units/ml aPTT 66-102 seconds Monitor platelets by anticoagulation protocol: Yes   Plan:  7/7:  HL @ 2306 = 0.28, Subtherapeutic Will order heparin 900 units IV X 1 bolus and increase drip rate to 1500 units/hr. Will recheck HL 6 hrs after rate change.   ROrene Desanctis PharmD Clinical Pharmacist   10/26/2021 12:19 AM

## 2021-10-26 NOTE — Consult Note (Signed)
SURGICAL CONSULTATION NOTE   HISTORY OF PRESENT ILLNESS (HPI):  46 y.o. [name] presented to [facility] ED for evaluation of abdominal pain on 10/22/21. Patient reports at the moment of presentation the patient reported that she had a sudden onset abdominal pain.  Abdominal pain was generalized.  She initially arrived with 14,000 white blood cell count.  Hemoglobin of 10.6.  Elevated platelets.  Upon complete work-up she has CT angio of the chest abdominal pelvis.  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  She was admitted with diagnosis of acute or chronic mesenteric ischemia.  She had a vascular intervention with percutaneous angioplasty of the celiac artery.  Despite this intervention the patient continue with abdominal pain.  Repeated CT scan shows persistent occlusion of the celiac artery despite angioplasty.  I have reviewed the last evaluation by vascular surgery.  Unfortunately they mentioned that there is no further intervention for revascularization of her chronic mesenteric ischemia.  The new CT scan of the abdomen and pelvis shows worsening dilation of the gallbladder and pericholecystic inflammation consistent with cholecystitis.  I personally evaluated the images.  Surgery is consulted by Dr. Damita Dunnings in this context for evaluation and management of acute acalculous cholecystitis.  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     PAST SURGICAL HISTORY [facility]):  Past Surgical History:  Procedure Laterality Date   BYPASS AXILLA/BRACHIAL ARTERY Right 08/17/2020   Procedure: BYPASS AXILLA/BRACHIAL ARTERY;  Surgeon: Katha Cabal, MD;  Location: [facility] ORS;  Service: Vascular;  Laterality: Right;   CESAREAN SECTION     times 4   EMBOLECTOMY Right 03/09/2020   Procedure: Brachial EMBOLECTOMY;   Surgeon: Algernon Huxley, MD;  Location: [facility] ORS;  Service: Vascular;  Laterality: Right;   ENDARTERECTOMY Right 08/17/2020   Procedure: ENDARTERECTOMY BRACHIAL;  Surgeon: Katha Cabal, MD;  Location: [facility] ORS;  Service: Vascular;  Laterality: Right;   THROMBECTOMY BRACHIAL ARTERY Right 08/17/2020   Procedure: THROMBECTOMY BRACHIAL ARTERY;  Surgeon: Katha Cabal, MD;  Location: [facility] ORS;  Service: Vascular;  Laterality: Right;   UPPER EXTREMITY ANGIOGRAPHY Right 05/28/2020   Procedure: UPPER EXTREMITY ANGIOGRAPHY;  Surgeon: Algernon Huxley, MD;  Location: [place] [facility];  Service: Cardiovascular;  Laterality: Right;   UPPER EXTREMITY ANGIOGRAPHY Right 08/16/2020   Procedure: UPPER EXTREMITY ANGIOGRAPHY;  Surgeon: Algernon Huxley, MD;  Location: [place] [facility];  Service: Cardiovascular;  Laterality: Right;   VISCERAL ARTERY INTERVENTION N/A 10/24/2021   Procedure: VISCERAL ARTERY INTERVENTION;  Surgeon: Katha Cabal, MD;  Location: [place] [facility];  Service: Cardiovascular;  Laterality: N/A;     MEDICATIONS:  Prior to Admission medications   Medication Sig Start Date End Date Taking? Authorizing Provider  albuterol (PROVENTIL HFA;VENTOLIN HFA) 108 (90 Base) MCG/ACT inhaler Inhale 2 puffs into the lungs every 6 (six) hours as needed for wheezing or shortness of breath. Patient not taking: Reported on 10/22/2021 11/21/16   Merlyn Lot, MD  apixaban (ELIQUIS) 5 MG TABS tablet Take 1 tablet (5 mg total) by mouth 2 (two) times daily. 10/25/21 01/23/22  Pokhrel, Corrie Mckusick, MD  aspirin EC 81 MG tablet Take 1 tablet (81 mg total) by mouth daily. Patient not taking: Reported on 10/22/2021 05/28/20   Algernon Huxley, MD  atorvastatin (LIPITOR) 20 MG tablet  Take 1 tablet (20 mg total) by mouth daily. Patient not taking: Reported on 10/22/2021 08/21/20   Stegmayer, Janalyn Harder, PA-C     ALLERGIES:  No Known Allergies   SOCIAL HISTORY:  Social History   Socioeconomic History   Marital status:  Divorced    Spouse name: Not on file   Number of children: Not on file   Years of education: Not on file   Highest education level: Not on file  Occupational History   Not on file  Tobacco Use   Smoking status: Every Day    Packs/day: 0.50    Years: 20.00    Total pack years: 10.00    Types: Cigarettes    Last attempt to quit: 07/15/2020    Years since quitting: 1.2   Smokeless tobacco: Never  Vaping Use   Vaping Use: Never used  Substance and Sexual Activity   Alcohol use: Not Currently    Alcohol/week: 2.0 standard drinks of alcohol    Types: 2 Cans of beer per week    Comment: 1-2 days a week per patient. husband says more often   Drug use: No   Sexual activity: Not on file  Other Topics Concern   Not on file  Social History Narrative   Not on file   Social Determinants of Health   Financial Resource Strain: Not on file  Food Insecurity: Not on file  Transportation Needs: Not on file  Physical Activity: Not on file  Stress: Not on file  Social Connections: Not on file  Intimate Partner Violence: Not on file      FAMILY HISTORY:  Family History  Adopted: Yes  Problem Relation Age of Onset   Hypertension Mother      REVIEW OF SYSTEMS:  Constitutional: denies weight loss, fever, chills, or sweats  Eyes: denies any other vision changes, history of eye injury  ENT: denies sore throat, hearing problems  Respiratory: denies shortness of breath, wheezing  Cardiovascular: denies chest pain, palpitations  Gastrointestinal: positive abdominal pain, nausea and vomiting Genitourinary: denies burning with urination or urinary frequency Musculoskeletal: positive joint pains or cramps  Skin: denies any other rashes or skin discolorations  Neurological: denies any other headache, dizziness, weakness  Psychiatric: denies any other depression, anxiety   All other review of systems were negative   VITAL SIGNS:  Temp:  [97.9 F (36.6 C)-98.7 F (37.1 C)] 98.4 F (36.9  C) (07/08 0440) Pulse Rate:  [80-81] 81 (07/08 0440) Resp:  [16-20] 16 (07/08 0441) BP: (150-182)/(64-97) 152/64 (07/08 0440) SpO2:  [93 %-98 %] 96 % (07/08 0441)     Height: '5\' 7"'$  (170.2 cm) Weight: 61.2 kg BMI (Calculated): 21.14   INTAKE/OUTPUT:  This shift: No intake/output data recorded.  Last 2 shifts: '@IOLAST2SHIFTS'$ @   PHYSICAL EXAM:  Constitutional:  -- Normal body habitus  -- Awake, alert, and oriented x3  Eyes:  -- Pupils equally round and reactive to light  -- No scleral icterus  Ear, nose, and throat:  -- No jugular venous distension  Pulmonary:  -- No crackles  -- Equal breath sounds bilaterally -- Breathing non-labored at rest Cardiovascular:  -- S1, S2 present  -- No pericardial rubs Gastrointestinal:  -- Abdomen soft, nontender, non-distended, no guarding or rebound tenderness -- No abdominal masses appreciated, pulsatile or otherwise  Musculoskeletal and Integumentary:  -- Wounds: None appreciated -- Extremities: Absent right upper extremity.  Left upper extremity swelling. Neurologic:  -- Motor function: intact and symmetric -- Sensation: intact  and symmetric   Labs:     Latest Ref Rng & Units 10/25/2021    3:12 AM 10/24/2021    4:26 AM 10/23/2021    1:08 AM  CBC  WBC 4.0 - 10.5 K/uL 26.1  21.2  16.0   Hemoglobin 12.0 - 15.0 g/dL 8.9  9.6  9.9   Hematocrit 36.0 - 46.0 % 28.6  31.1  32.4   Platelets 150 - 400 K/uL 614  764  718       Latest Ref Rng & Units 10/25/2021    3:12 AM 10/24/2021    4:26 AM 10/23/2021    1:08 AM  CMP  Glucose 70 - 99 mg/dL 128  125  119   BUN 6 - 20 mg/dL '6  8  7   '$ Creatinine 0.44 - 1.00 mg/dL 0.91  0.91  0.79   Sodium 135 - 145 mmol/L 132  133  139   Potassium 3.5 - 5.1 mmol/L 3.3  3.4  3.6   Chloride 98 - 111 mmol/L 101  101  107   CO2 22 - 32 mmol/L '25  24  23   '$ Calcium 8.9 - 10.3 mg/dL 8.2  8.7  8.8   Total Protein 6.5 - 8.1 g/dL  7.2    Total Bilirubin 0.3 - 1.2 mg/dL  0.6    Alkaline Phos Suzanne - 126 U/L  79    AST  15 - 41 U/L  28    ALT 0 - 44 U/L  17      Imaging studies:  EXAM: CT CHEST, ABDOMEN, AND PELVIS WITH CONTRAST   TECHNIQUE: Multidetector CT imaging of the chest, abdomen and pelvis was performed following the standard protocol during bolus administration of intravenous contrast.   RADIATION DOSE REDUCTION: This exam was performed according to the departmental dose-optimization program which includes automated exposure control, adjustment of the mA and/or kV according to patient size and/or use of iterative reconstruction technique.   CONTRAST:  126m OMNIPAQUE IOHEXOL 300 MG/ML  SOLN   COMPARISON:  CT angiogram chest abdomen pelvis, 10/22/2021   FINDINGS: CT CHEST FINDINGS   Cardiovascular: Scattered aortic atherosclerosis. Redemonstrated extensive, irregular mural thrombus throughout the descending thoracic aorta, contiguous with the upper abdominal aorta. Normal heart size. Left coronary artery calcifications. No pericardial effusion.   Mediastinum/Nodes: No enlarged mediastinal, hilar, or axillary lymph nodes. Small hiatal hernia. Thyroid gland, trachea, and esophagus demonstrate no significant findings.   Lungs/Pleura: Mild, predominantly paraseptal emphysema. Diffuse bilateral bronchial wall thickening. No pleural effusion or pneumothorax.   Musculoskeletal: No chest wall mass or suspicious osseous lesions identified.   CT ABDOMEN PELVIS FINDINGS   Hepatobiliary: New hypodense subcapsular lesion of the anterior right lobe of the liver, hepatic segment VIII, measuring 2.8 x 2.0 cm (series 2, image 52). Distended gallbladder with pericholecystic fat stranding (series 2, image 73). Gallbladder wall thickening, or biliary dilatation.   Pancreas: Unremarkable. No pancreatic ductal dilatation or surrounding inflammatory changes.   Spleen: Chronically infarcted, atrophic spleen.   Adrenals/Urinary Tract: Adrenal glands are unremarkable. Lobulated bilateral renal  cortex, consistent with prior infarction. Kidneys are otherwise normal, without renal calculi, solid lesion, or hydronephrosis. Bladder is unremarkable.   Stomach/Bowel: Stomach is within normal limits. Appendix appears normal. No evidence of bowel wall thickening, distention, or inflammatory changes.   Vascular/Lymphatic: Aortic atherosclerosis. Large burden of irregular thrombus throughout the abdominal aorta. 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 The superior mesenteric  artery remains patent. No enlarged abdominal or pelvic lymph nodes.   Reproductive: No mass or other abnormality.   Other: No abdominal wall hernia or abnormality. No ascites.   Musculoskeletal: No acute osseous findings.   IMPRESSION: 1. Redemonstrated extensive, irregular mural thrombus throughout the thoracic and abdominal aorta on this non tailored, non angiographic examination. 2. 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 The superior mesenteric artery remains patent. 3. New hypodense subcapsular lesion of the anterior right lobe of the liver, hepatic segment VIII, measuring 2.8 x 2.0 cm. This is concerning for a hepatic infarction given interval development, despite this being a very uncommon entity. 4. Distended gallbladder with pericholecystic fat stranding, consistent with acute cholecystitis, as above possibly secondary to ischemia. 5. Emphysema and diffuse bilateral bronchial wall thickening. 6. Aortic atherosclerosis, advanced for patient age.   These results will be called to the ordering clinician or representative by the Radiologist Assistant, and communication documented in the PACS or [employer].   Aortic Atherosclerosis (ICD10-I70.0) and Emphysema (ICD10-J43.9).     Electronically Signed   By: Delanna Ahmadi M.D.   On: 10/25/2021 19:39  Assessment/Plan:  46 y.o.  [name] with acute acalculous cholecystitis, complicated by pertinent comorbidities including severe undiagnosed hypercoagulable state, chronic mesenteric ischemia, liver cirrhosis, COPD.  Patient with extensive vascular history.  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  She had percutaneous intervention with angioplasty to celiac trunk.  Follow-up CT scan shows persistent occlusion of the celiac trunk.  The new CT scan shows worsening pericholecystic fluid with dilation of the gallbladder.  I was consulted for recommendations regarding her cholecystitis.  Patient with a calculus cholecystitis.  She is extremely high risk of surgery.  General anesthesia puts patients in an even higher hypercoagulable state.  This patient despite having angioplasty and being on heparin drip she continue producing thrombus and not responding to medical therapy.  I cholecystectomy in this patient will be easily 3 to 4-hour case that she will not be on any anticoagulation.  This puts her even in a high risk of progressive ischemic disease.  I do not recommend surgical cholecystectomy in this patient.  At the most patient should be evaluated by interventional radiology to see if percutaneous cholecystostomy could be placed for drainage of the distended gallbladder.  The purpose of draining the gallbladder will be to improve her pain in the upper abdomen and prevent further ischemia of the gallbladder due to distention.  It is important to understand that her chronic abdominal pain will not resolve.  At least if her pain improves she can be weaned of IV pain medications.  I will also recommend palliative service evaluation since this seems to be a progressive disease and as per the vascular surgery they do not see any further intervention possible to improve her current  status.  Goals of care and plan of further chronic pain management and anticoagulation needs to be addressed.  This consult encounter was for 65 minutes most of the time evaluation of chart, evaluation of labs, evaluation of images, discussing case with consulting physician and coordinating plan of care.  Arnold Long, MD

## 2021-10-26 NOTE — Assessment & Plan Note (Signed)
NSVT 10/26/2021 - 2 runs of NSVT of 5 and 6.  Patient asymptomatic -Will check potassium and magnesium and replete as needed to keep potassium over 4 and magnesium over 2 - Continue cardiac monitoring -Likely related to acute medical problems but can consider cardiology consult in the a.m. and work-up to include echocardiogram

## 2021-10-26 NOTE — Procedures (Signed)
Pre procedural Dx: Acute cholecysitis Post procedural Dx: Same  Technically successful [place] and Fluoro guided placement of a 10 Fr drainage catheter placement into the gallbladder lumen. Chole tube connected to gravity bag.  EBL: None Complications: None immediate  Note, sonographic evaluation of the right lobe of the liver failed to confidently identified ill-defined hypoattenuating lesion involving the dome of the anterior segment of the right lobe of the liver questioned on preceding noncontrast abdominal CT.   Ronny Bacon, MD Pager #: [phone]

## 2021-10-26 NOTE — Consult Note (Signed)
ANTICOAGULATION CONSULT NOTE - Follow Up Consult  Pharmacy Consult for Heparin gtt Indication:  aortic thrombus/MI  No Known Allergies  Patient Measurements: Height: '5\' 7"'$  (170.2 cm) Weight: 61.2 kg (135 lb) IBW/kg (Calculated) : 61.6 Heparin Dosing Weight: 61.2kg  Vital Signs: Temp: 98.7 F (37.1 C) (07/08 1538) BP: 148/91 (07/08 1538) Pulse Rate: 82 (07/08 1538)  Labs: Recent Labs    10/24/21 0426 10/25/21 0312 10/25/21 1039 10/25/21 2306 10/26/21 0701 10/26/21 1458  HGB 9.6* 8.9*  --   --  9.1*  --   HCT 31.1* 28.6*  --   --  30.1*  --   PLT 764* 614*  --   --  709*  --   HEPARINUNFRC 0.35 0.24*   < > 0.28* 0.64 <0.10*  CREATININE 0.91 0.91  --   --  0.92  --    < > = values in this interval not displayed.   Heparin Dosing Weight: 61.2kg  Estimated Creatinine Clearance: 75.4 mL/min (by C-G formula based on SCr of 0.92 mg/dL).  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 Pharmacy consulted for mgmt of heparin gtt.  Date Time aPTT/HL Rate/Comment 7/5 0702 HL 0.22 SUBtherapeutic @ 1100 units/hr     7/5 1714 HL 0.45 Therapeutic x1 @ 1250 units/hr 7/6      0426    HL 0.35           Therapeutic X 3 @ 1250 units/hr 7/6 1246 - 2100  HEPARIN STOPPED FOR PROCEDURE 7/7      0312    HL 0.24           Subtherapeutic 7/7 1039 HL < 0.1 Subtherapeutic 7/7 1330 HL < 0.1 (repeat)Subtherapeutic 7/7      2306    HL =  0.28       Subtherapeutic  7/8      0701    HL =  0.64       Therapeutic x 1 7/8      1458    HL < 0.1      Heparin held for procedure  Baseline Labs: aPTT - pending INR - pending HL - pending Hgb - 10.6 Plts - 784  Goal of  Therapy:  Heparin level 0.3-0.7 units/ml aPTT 66-102 seconds Monitor platelets by anticoagulation protocol: Yes   Plan:  7/8:  HL @ 1458 < 0.1, patient went for a procedure and the Heparin was stopped and then restarted just prior to lab draw. Continue heparin drip rate at 1500 units/hr. Will check HL in 6 hours. Daily CBC while on Heparin drip.  LPaulina Fusi PharmD, BCPS 10/26/2021 4:24 PM

## 2021-10-26 NOTE — Consult Note (Signed)
GI Inpatient Consult Note  Reason for Consult: Acute cholecystitis, hepatic infarct    Attending Requesting Consult: Dr. Judd Gaudier  History of Present Illness: Suzanne [name] is a 46 y.o. female seen for evaluation of acute cholecystitis and acute hepatic infarct at the request of hospitalist - Dr. Judd Gaudier. 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 She initially presented to the [facility] ED 7/4 for complaints of sudden onset of severe generalized abdominal pain. 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 She is s/p vascular surgery intervention 7/6 with angioplasty of celiac artery which was unsuccessful. She continues to have severe abdominal pain despite angioplasty. Unfortunately, vascular surgery does not have any options for further interventions or surgeries. GI consulted for further work-up to rule out ulcerative disease in setting of patient with ischemic disease. Repeat CT scan last night showed acute cholecystitis and acute hepatic infarct. Dr. Windell Moment in General Surgery saw patient this morning and advised against cholecystectomy since she would be high-risk for further ischemia. A cholecystostomy tube placement by IR was recommended and planned for today.   Patient seen and examined this morning. No acute events overnight. She reports her pain is not controlled on PCA pump. She reports pain is 9/10 currently and hurts worse in her left lower abdomen. She is nauseous and did have emesis episode x1 last night. She denies any heartburn or acid reflux symptoms. She denies any esophageal dysphagia or  odynophagia symptoms. She denies any diarrhea, rectal bleeding, or fecal urgency. Her abdominal pain is not changed with a bowel movement. She is very uncomfortable. She does drink EtOH daily and chart reports sometimes a whole case of beer daily.    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    Problem List: Patient Active Problem List   Diagnosis Date Noted   Hypokalemia 10/24/2021   Abdominal pain 10/23/2021   PVD (peripheral vascular disease) ([place]) 10/23/2021   Hypercoagulable state ([place]) 10/23/2021   GERD (gastroesophageal reflux disease) 10/23/2021   Chronic pain 10/23/2021   COPD (chronic obstructive pulmonary disease) ([place]) 10/23/2021   Tobacco use 10/23/2021   Alcohol abuse 10/23/2021   Leg pain, right 10/23/2021   Essential hypertension 10/23/2021   Mesenteric ischemia ([place]) 10/22/2021   Ischemia of extremity 08/17/2020   Sprain of right wrist 01/16/2020   DVT (deep venous thrombosis) ([place]) 12/21/2018   Arterial thrombosis ([place]) 12/21/2018   Hyperlipidemia 12/18/2017   Chest pain 01/22/2016   Tobacco dependence 01/22/2016   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   Aortic thrombus ([place]) 01/15/2015    Past Surgical History: Past Surgical History:  Procedure Laterality Date   BYPASS AXILLA/BRACHIAL ARTERY Right 08/17/2020   Procedure: BYPASS AXILLA/BRACHIAL ARTERY;  Surgeon: Katha Cabal, MD;  Location: [facility] ORS;  Service: Vascular;  Laterality: Right;   CESAREAN SECTION     times 4   EMBOLECTOMY Right 03/09/2020   Procedure: Brachial EMBOLECTOMY;  Surgeon: Algernon Huxley, MD;  Location: [facility] ORS;  Service: Vascular;  Laterality: Right;   ENDARTERECTOMY Right 08/17/2020  Procedure: ENDARTERECTOMY BRACHIAL;  Surgeon: Katha Cabal, MD;  Location: [facility] ORS;  Service: Vascular;  Laterality: Right;   THROMBECTOMY BRACHIAL ARTERY Right 08/17/2020    Procedure: THROMBECTOMY BRACHIAL ARTERY;  Surgeon: Katha Cabal, MD;  Location: [facility] ORS;  Service: Vascular;  Laterality: Right;   UPPER EXTREMITY ANGIOGRAPHY Right 05/28/2020   Procedure: UPPER EXTREMITY ANGIOGRAPHY;  Surgeon: Algernon Huxley, MD;  Location: [place] [facility];  Service: Cardiovascular;  Laterality: Right;   UPPER EXTREMITY ANGIOGRAPHY Right 08/16/2020   Procedure: UPPER EXTREMITY ANGIOGRAPHY;  Surgeon: Algernon Huxley, MD;  Location: [place] [facility];  Service: Cardiovascular;  Laterality: Right;   VISCERAL ARTERY INTERVENTION N/A 10/24/2021   Procedure: VISCERAL ARTERY INTERVENTION;  Surgeon: Katha Cabal, MD;  Location: [place] [facility];  Service: Cardiovascular;  Laterality: N/A;    Allergies: No Known Allergies  Home Medications: Medications Prior to Admission  Medication Sig Dispense Refill Last Dose   [DISCONTINUED] ELIQUIS 2.5 MG TABS tablet TAKE 2 TABLETS(5 MG) BY MOUTH TWICE DAILY 30 tablet 1    albuterol (PROVENTIL HFA;VENTOLIN HFA) 108 (90 Base) MCG/ACT inhaler Inhale 2 puffs into the lungs every 6 (six) hours as needed for wheezing or shortness of breath. (Patient not taking: Reported on 10/22/2021) 1 Inhaler 2 Not Taking   aspirin EC 81 MG tablet Take 1 tablet (81 mg total) by mouth daily. (Patient not taking: Reported on 10/22/2021) 150 tablet 2 Not Taking   atorvastatin (LIPITOR) 20 MG tablet Take 1 tablet (20 mg total) by mouth daily. (Patient not taking: Reported on 10/22/2021) 90 tablet 3 Not Taking   Home medication reconciliation was completed with the patient.   Scheduled Inpatient Medications:    amLODipine  5 mg Oral Daily   atorvastatin  80 mg Oral Daily   clopidogrel  75 mg Oral Q breakfast   folic acid  1 mg Oral Daily   morphine   Intravenous Q4H   multivitamin with minerals  1 tablet Oral Daily   nicotine  21 mg Transdermal Daily   pantoprazole (PROTONIX) IV  40 mg Intravenous Q12H   sodium chloride flush  3 mL Intravenous Q12H    thiamine  100 mg Oral Daily   Or   thiamine  100 mg Intravenous Daily    Continuous Inpatient Infusions:    sodium chloride     sodium chloride 150 mL/hr at 10/26/21 0007   heparin 1,500 Units/hr (10/26/21 0037)   piperacillin-tazobactam (ZOSYN)  IV 3.375 g (10/26/21 0047)   promethazine (PHENERGAN) injection (IM or IVPB)      PRN Inpatient Medications:  sodium chloride, acetaminophen **OR** acetaminophen, oxyCODONE **AND** acetaminophen, albuterol, diphenhydrAMINE **OR** diphenhydrAMINE, hydrALAZINE, iohexol, naloxone **AND** sodium chloride flush, ondansetron (ZOFRAN) IV, promethazine (PHENERGAN) injection (IM or IVPB), sodium chloride flush  Family History: family history includes Hypertension in her mother. She was adopted.  The patient's family history is negative for inflammatory bowel disorders, GI malignancy, or solid organ transplantation.  Social History:   reports that she has been smoking cigarettes. She has a 10.00 pack-year smoking history. She has never used smokeless tobacco. She reports that she does not currently use alcohol after a past usage of about 2.0 standard drinks of alcohol per week. She reports that she does not use drugs. The patient denies ETOH, tobacco, or drug use.   Review of Systems: Constitutional: + weight loss Eyes: No changes in vision. ENT: No oral lesions, sore throat.  GI: see HPI.  Heme/Lymph: No easy  bruising.  CV: No chest pain.  GU: No hematuria.  Integumentary: No rashes.  Neuro: No headaches.  Psych: + anxiety, +depression Endocrine: No heat/cold intolerance.  Allergic/Immunologic: No urticaria.  Resp: No cough, SOB.  Musculoskeletal: No joint swelling.    Physical Examination: BP (!) 152/64 (BP Location: Left Leg)   Pulse 81   Temp 98.4 F (36.9 C)   Resp 16   Ht '5\' 7"'$  (1.702 m)   Wt 61.2 kg   LMP 10/08/2021   SpO2 96%   BMI 21.14 kg/m  Gen: NAD, alert and oriented x 4, appears uncomfortable HEENT: PEERLA,  EOMI, Neck: supple, no JVD or thyromegaly Chest: CTA bilaterally, no wheezes, crackles, or other adventitious sounds CV: RRR, no m/g/c/r Abd: soft, nondistended, hypoactive BS in all four quadrants; tender to light and deep palpation in all four quadrants, LLQ greater than others, no HSM, guarding, ridigity, or rebound tenderness Ext: absent RUE, left upper extremity swelling  Skin: no rash or lesions noted Lymph: no LAD  Data: Lab Results  Component Value Date   WBC 26.1 (H) 10/25/2021   HGB 8.9 (L) 10/25/2021   HCT 28.6 (L) 10/25/2021   MCV 74.5 (L) 10/25/2021   PLT 614 (H) 10/25/2021   Recent Labs  Lab 10/23/21 0108 10/24/21 0426 10/25/21 0312  HGB 9.9* 9.6* 8.9*   Lab Results  Component Value Date   NA 132 (L) 10/25/2021   K 3.3 (L) 10/25/2021   CL 101 10/25/2021   CO2 25 10/25/2021   BUN 6 10/25/2021   CREATININE 0.91 10/25/2021   Lab Results  Component Value Date   ALT 17 10/24/2021   AST 28 10/24/2021   ALKPHOS 79 10/24/2021   BILITOT 0.6 10/24/2021   Recent Labs  Lab 10/22/21 1823 10/23/21 0108  APTT 30 55*  INR 1.1  --    CT chest/abd/pelvis with contrast 10/25/2021: IMPRESSION: 1. Redemonstrated extensive, irregular mural thrombus throughout the thoracic and abdominal aorta on this non tailored, non angiographic examination. 2. 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 The superior mesenteric artery remains patent. 3. New hypodense subcapsular lesion of the anterior right lobe of the liver, hepatic segment VIII, measuring 2.8 x 2.0 cm. This is concerning for a hepatic infarction given interval development, despite this being a very uncommon entity. 4. Distended gallbladder with pericholecystic fat stranding, consistent with acute cholecystitis, as above possibly secondary to ischemia. 5. Emphysema and diffuse bilateral bronchial wall thickening. 6. Aortic atherosclerosis, advanced for patient age.    These results will be called to the ordering clinician or representative by the Radiologist Assistant, and communication documented in the PACS or [employer].   Aortic Atherosclerosis (ICD10-I70.0) and Emphysema (ICD10-J43.9).    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 She is s/p vascular surgery intervention 7/6 with angioplasty of celiac artery which was unsuccessful. GI consulted for further work-up to rule out ulcerative disease in setting of patient with ischemic disease. Repeat CT scan last night showed acute cholecystitis and acute hepatic infarct.   Acute acalculous  cholecystitis likely 2/2 ischemic disease  Acute hepatic infarct   Acute on chronic mesenteric ischemia s/p unsuccessful vascular intervention 7/6  Compensated alcoholic cirrhosis in setting of active EtOH abuse  Hypercoagulable state  Severe PVD - noncompliant with anticoagulation at home   Recommendations:  - Unfortunately, there is not any current GI interventions to add at this point in time. Her cross-sectional imaging last night showed acute cholecystitis likely in setting of ischemia as well as hepatic infarct in right lobe of the liver. EGD or other GI interventions are not indicated at this time.  - General Surgery has been consulted and Dr. Windell Moment following patient. She will likely get cholecystostomy tube placement via IR today - Consult to Interventional Radiology  - Continue supportive care with  antiemetics, antibiotics, IVF hydration, and pain control - Continue treatment of other comorbidities per primary team - No evidence of hepatic decompensation (varices, hepatic encephalopathy, ascites, or acute liver failure) present - She can follow-up as outpatient to establish care for cirrhosis and discussion on [place] surveillance  - Prognosis is poor give history of medication noncompliance and severe comorbidities - Agree with palliative care consult to establish goals of care and symptom management - GI will sign off at this time and follow peripherally  Thank you for the consult. Please call with questions or concerns.  Geanie Kenning, PA-C [facility] [phone] (Office #)

## 2021-10-27 DIAGNOSIS — K559 Vascular disorder of intestine, unspecified: Secondary | ICD-10-CM | POA: Diagnosis not present

## 2021-10-27 LAB — CBC
HCT: 27.5 % — ABNORMAL LOW (ref 36.0–46.0)
Hemoglobin: 8.5 g/dL — ABNORMAL LOW (ref 12.0–15.0)
MCH: 23.3 pg — ABNORMAL LOW (ref 26.0–34.0)
MCHC: 30.9 g/dL (ref 30.0–36.0)
MCV: 75.3 fL — ABNORMAL LOW (ref 80.0–100.0)
Platelets: 699 10*3/uL — ABNORMAL HIGH (ref 150–400)
RBC: 3.65 MIL/uL — ABNORMAL LOW (ref 3.87–5.11)
RDW: 18.9 % — ABNORMAL HIGH (ref 11.5–15.5)
WBC: 18.3 10*3/uL — ABNORMAL HIGH (ref 4.0–10.5)
nRBC: 0 % (ref 0.0–0.2)

## 2021-10-27 LAB — HEPARIN LEVEL (UNFRACTIONATED): Heparin Unfractionated: 0.57 IU/mL (ref 0.30–0.70)

## 2021-10-27 MED ORDER — POTASSIUM CHLORIDE 10 MEQ/100ML IV SOLN
10.0000 meq | INTRAVENOUS | Status: AC
  Administered 2021-10-27 (×2): 10 meq via INTRAVENOUS
  Filled 2021-10-27 (×2): qty 100

## 2021-10-27 MED ORDER — EMPTY CONTAINERS FLEXIBLE MISC
25.0000 g | Freq: Once | Status: DC
Start: 2021-10-27 — End: 2021-10-27

## 2021-10-27 NOTE — Consult Note (Signed)
ANTICOAGULATION CONSULT NOTE - Follow Up Consult  Pharmacy Consult for Heparin gtt Indication:  aortic thrombus/MI  No Known Allergies  Patient Measurements: Height: '5\' 7"'$  (170.2 cm) Weight: 61.2 kg (135 lb) IBW/kg (Calculated) : 61.6 Heparin Dosing Weight: 61.2kg  Vital Signs: Temp: 98.2 F (36.8 C) (07/09 0538) Temp Source: Oral (07/09 0538) BP: 154/43 (07/09 0538) Pulse Rate: 74 (07/09 0538)  Labs: Recent Labs    10/25/21 0312 10/25/21 1039 10/26/21 0701 10/26/21 1458 10/26/21 2240 10/27/21 0625  HGB 8.9*  --  9.1*  --   --  8.5*  HCT 28.6*  --  30.1*  --   --  27.5*  PLT 614*  --  709*  --   --  699*  HEPARINUNFRC 0.24*   < > 0.64 <0.10* 0.42 0.57  CREATININE 0.91  --  0.92  --  0.89  --    < > = values in this interval not displayed.   Heparin Dosing Weight: 61.2kg  Estimated Creatinine Clearance: 77.9 mL/min (by C-G formula based on SCr of 0.89 mg/dL).  Medications:  PTA: Eliquis 2.'5mg'$  BID (RN confirmed, pt has been OFF for last 2 months PTA) Inpatient: +heparin gtt  Assessment: 46yo F w/ h/o renal & splenic infarcts (2016), Aortic thrombus (2016), DVT & arterial thrombus (12/2018), & extremity ischemia (2022) previously on Eliquis but stopped 52-month prior to arrival now presenting with sudden onset of abd' pain a/w N/V. Pharmacy consulted for mgmt of heparin gtt.  Date Time aPTT/HL Rate/Comment 7/5 0702 HL 0.22 SUBtherapeutic @ 1100 units/hr     7/5 1714 HL 0.45 Therapeutic x1 @ 1250 units/hr 7/6      0426    HL 0.35           Therapeutic X 3 @ 1250 units/hr 7/6 1246 - 2100  HEPARIN STOPPED FOR PROCEDURE 7/7      0312    HL 0.24           Subtherapeutic 7/7 1039 HL < 0.1 Subtherapeutic 7/7 1330 HL < 0.1 (repeat)Subtherapeutic 7/7      2306    HL =  0.28       Subtherapeutic  7/8      0701    HL =  0.64       Therapeutic x 1 7/8      1458    HL < 0.1      Heparin held for procedure 7/8      2240    HL = 0.42        Therapeutic X 1  7/9      0625    HL  = 0.57        Therapeutic X 2  Baseline Labs: aPTT - pending INR - pending HL - pending Hgb - 10.6 Plts - 784  Goal of Therapy:  Heparin level 0.3-0.7 units/ml aPTT 66-102 seconds Monitor platelets by anticoagulation protocol: Yes   Plan:  7/9:  HL @ 0625 = 0.57, therapeutic X 2 Will continue pt on current rate and recheck HL on 7/10 with AM labs.   Sherlie Boyum D 10/27/2021 7:16 AM

## 2021-10-27 NOTE — Progress Notes (Signed)
Progress Note Patient: Suzanne [name] ZTI:[id number] DOB: 11/06/1975 DOA: 10/22/2021  DOS: the patient was seen and examined on 10/27/2021  Brief hospital course: ANALYS RYDEN is a 46 y.o. female with PMH of HTN, PAD, active smoker, hypercoagulability-splenic infarct, aortic thrombus, right upper extremity acute limb ischemia failed multiple revascularization attempts ultimately requiring right transfer radial amputation, COPD.  7/4 presented to ER with abdominal pain.  Associated with melena.  Vascular surgery was consulted for mesenteric ischemia concerns. 7/6 underwent's abdominal aortogram, percutaneous angioplasty of celiac artery.  Has patent SMA after multiple difficult times, celiac artery was engaged.  There is also significant disease in gastroduodenal artery.  As well as hepatic and splenic arteries. For vascular surgery rest of the disease process is not reconstructable using interventional technique. Patient is not a good candidate for surgical reconstruction due to her extensive distal disease. Postoperatively patient had some abdominal pain and a CT abdomen pelvis was performed which showed hepatic artery infarction as well as a calculus cholecystitis. 7/7 General surgery consulted.  Recommend conservative measures due to extensive risk for clot progression going to cholecystectomy.  IR was consulted for percutaneous cholecystostomy.  Patient was started on IV Zosyn 7/8 IR placed percutaneous cholecystostomy drain.  IR also placed left PICC line. Diet advanced to clear liquid diet. 7/9 diet advanced to full liquid diet.  Leukocytosis improving.  Still on heparin.  Assessment and Plan: Extensive history of peripheral vascular disease. Mesenteric ischemia. Hepatic infarction. Patient presents with complaints of abdominal pain. CT abdomen pelvis shows severe atherosclerotic plaque as well as noncalcified atheromatous plaque of the thoracic and abdominal aorta with chronic  thrombus. Vascular surgery was consulted. Underwent angiogram. 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 Vascular surgery felt that the patient is not a candidate for any intervention including surgery for reconstruction. This leaves the patient with a very high risk for poor outcome in future. Palliative care consulted. Currently on IV morphine PCA for pain control. Also on IV heparin. Continue Plavix. Monitor.  Abdominal pain appears to be multifactorial. Patient also has constipation which we will treat aggressively.  Acalculous cholecystitis. Blood cultures performed on 7/8 before PICC line placement. Currently on IV Zosyn. General surgery consulted not a candidate for any surgical intervention. IR consulted for percutaneous placement of cholecystostomy drain. Leukocytosis improving.  Abdominal pain improving.  Advancing diet per surgery.   Noncompliance. Intermittently refusing to remain compliant with medical plan.  Alcohol abuse. Patient was on CIWA protocol. Currently out of the window.. Monitor.  Amputation of the right upper extremity. Prior history. Monitor.  Hypercoagulable state with history of splenic and renal infarct in 2016. Aortic thrombus in 2016. DVT in 2020. Right lower extremity ischemia 2022. Patient will require anticoagulation on discharge currently on IV heparin. Etiology of her hypercoagulable state is still not clear.   COPD. Does not appear to have any acute abnormality.  Monitor.  Hypokalemia. Hyponatremia. Replaced.   Goals of care conversation. Per my conversation with vascular surgery patient is a poor candidate for any reconstructive surgeries. Patient appears to have extensive vascular disease and not a candidate for any intervention as well. Per general surgery patient is not a candidate for any intervention from their point of view as well. Patient will continue to have  chronic pain as well as limited prognosis. Palliative care consult for further goals of care conversation.   Subjective: Did report improving abdominal pain.  No nausea no vomiting.  No fever no chills.  Although has used PCA multiple times.  Physical Exam: Vitals:   10/27/21 0858 10/27/21 1218 10/27/21 1609 10/27/21 1614  BP:   115/88   Pulse:   68   Resp:  '14 14 15  '$ Temp: 98.6 F (37 C)  98.8 F (37.1 C)   TempSrc: Oral  Oral   SpO2:  95% 95% 95%  Weight:      Height:       General: Appear in mild distress; no visible Abnormal Neck Mass Or lumps, Conjunctiva normal Cardiovascular: S1 and S2 Present, no Murmur, Respiratory: good respiratory effort, Bilateral Air entry present and faint basal crackles, no wheezes Abdomen: Bowel Sound present, mild diffuse tenderness Extremities: no Pedal edema Neurology: alert and oriented to time, place, and person  Gait not checked due to patient safety concerns   Data Reviewed: I have Reviewed nursing notes, Vitals, and Lab results since pt's last encounter. Pertinent lab results CBC and BMP I have ordered test including CBC and BMP I have reviewed the last note from general surgery,    Family Communication: None at bedside  Disposition: Status is: Inpatient Remains inpatient appropriate because: Minimal oral intake, on multiple narcotics need to monitor for improvement in diet tolerance and infection.  Author: Berle Mull, MD 10/27/2021 5:02 PM  Please look on [email or web address] to find out who is on call.

## 2021-10-27 NOTE — Progress Notes (Signed)
Patient ID: Suzanne [name], female   DOB: May 21, 1975, 46 y.o.   MRN: [id number]     [place] Hospital Day(s): 5.   Interval History: Patient seen and examined, no acute events or new complaints overnight. Patient reports feeling better today.  The patient notes that the pain has significantly improved after percutaneous cholecystostomy.  She denies any nausea or vomiting.  She endorses having a bowel movement.  Vital signs in last 24 hours: [min-max] current  Temp:  [98.2 F (36.8 C)-99.1 F (37.3 C)] 98.6 F (37 C) (07/09 0858) Pulse Rate:  [74-92] 76 (07/09 0815) Resp:  [13-26] 15 (07/09 0815) BP: (128-155)/(43-112) 152/56 (07/09 0815) SpO2:  [95 %-99 %] 98 % (07/09 0815)     Height: '5\' 7"'$  (170.2 cm) Weight: 61.2 kg BMI (Calculated): 21.14   Physical Exam:  Constitutional: alert, cooperative and no distress  Respiratory: breathing non-labored at rest  Cardiovascular: regular rate and sinus rhythm  Gastrointestinal: soft, mild-tender, and non-distended  Labs:     Latest Ref Rng & Units 10/27/2021    6:25 AM 10/26/2021    7:01 AM 10/25/2021    3:12 AM  CBC  WBC 4.0 - 10.5 K/uL 18.3  27.1  26.1   Hemoglobin 12.0 - 15.0 g/dL 8.5  9.1  8.9   Hematocrit 36.0 - 46.0 % 27.5  30.1  28.6   Platelets 150 - 400 K/uL 699  709  614       Latest Ref Rng & Units 10/26/2021   10:40 PM 10/26/2021    7:01 AM 10/25/2021    3:12 AM  CMP  Glucose 70 - 99 mg/dL 110  103  128   BUN 6 - 20 mg/dL '7  8  6   '$ Creatinine 0.44 - 1.00 mg/dL 0.89  0.92  0.91   Sodium 135 - 145 mmol/L 133  134  132   Potassium 3.5 - 5.1 mmol/L 3.6  3.7  3.3   Chloride 98 - 111 mmol/L 102  103  101   CO2 22 - 32 mmol/L '23  22  25   '$ Calcium 8.9 - 10.3 mg/dL 8.1  8.5  8.2     Imaging studies: No new pertinent imaging studies   Assessment/Plan:  46 y.o. female with acute acalculous cholecystitis, complicated by pertinent comorbidities including severe undiagnosed hypercoagulable state, chronic mesenteric  ischemia, liver cirrhosis, COPD.  Acute acalculous cholecystitis -S/p percutaneous drainage -Patient with significant improvement of the abdominal pain.  White blood cell count decreasing -With improved physical exam, improved pain and improved white blood cell count I will start patient in clear liquid diet.  She can be advanced as tolerated. -Adequate bilious drainage from cholecystostomy tube -The decompression of the gallbladder should help significantly for pain control.  Patient will continue with chronic abdominal pain from chronic mesenteric ischemia  Chronic mesenteric ischemia -No clinical induration -Abdominal physical exam benign -Started on liquid diet.  Advance as tolerated -Continue anticoagulation and further recommendation by vascular surgery  Most likely patient will continue with chronic abdominal pain.  Percutaneous drainage she stated for at least 6 weeks.  Then she should be reevaluated by IR for cholangiogram.  If no stones drain might be able to be removed without any surgical intervention.  Surgery will continue to follow.  Arnold Long, MD

## 2021-10-28 DIAGNOSIS — F1010 Alcohol abuse, uncomplicated: Secondary | ICD-10-CM | POA: Diagnosis not present

## 2021-10-28 DIAGNOSIS — K763 Infarction of liver: Secondary | ICD-10-CM

## 2021-10-28 DIAGNOSIS — E876 Hypokalemia: Secondary | ICD-10-CM

## 2021-10-28 DIAGNOSIS — K819 Cholecystitis, unspecified: Secondary | ICD-10-CM

## 2021-10-28 DIAGNOSIS — R1084 Generalized abdominal pain: Secondary | ICD-10-CM | POA: Diagnosis not present

## 2021-10-28 DIAGNOSIS — K559 Vascular disorder of intestine, unspecified: Secondary | ICD-10-CM | POA: Diagnosis not present

## 2021-10-28 DIAGNOSIS — G8929 Other chronic pain: Secondary | ICD-10-CM | POA: Diagnosis not present

## 2021-10-28 LAB — BASIC METABOLIC PANEL
Anion gap: 8 (ref 5–15)
BUN: 6 mg/dL (ref 6–20)
CO2: 25 mmol/L (ref 22–32)
Calcium: 8 mg/dL — ABNORMAL LOW (ref 8.9–10.3)
Chloride: 101 mmol/L (ref 98–111)
Creatinine, Ser: 0.86 mg/dL (ref 0.44–1.00)
GFR, Estimated: >60 mL/min (ref >60)
Glucose, Bld: 120 mg/dL — ABNORMAL HIGH (ref 70–99)
Potassium: 3.7 mmol/L (ref 3.5–5.1)
Sodium: 134 mmol/L — ABNORMAL LOW (ref 135–145)

## 2021-10-28 LAB — CBC
HCT: 26.8 % — ABNORMAL LOW (ref 36.0–46.0)
Hemoglobin: 8.2 g/dL — ABNORMAL LOW (ref 12.0–15.0)
MCH: 22.9 pg — ABNORMAL LOW (ref 26.0–34.0)
MCHC: 30.6 g/dL (ref 30.0–36.0)
MCV: 74.9 fL — ABNORMAL LOW (ref 80.0–100.0)
Platelets: 717 10*3/uL — ABNORMAL HIGH (ref 150–400)
RBC: 3.58 MIL/uL — ABNORMAL LOW (ref 3.87–5.11)
RDW: 18.6 % — ABNORMAL HIGH (ref 11.5–15.5)
WBC: 17.2 10*3/uL — ABNORMAL HIGH (ref 4.0–10.5)
nRBC: 0.1 % (ref 0.0–0.2)

## 2021-10-28 LAB — MAGNESIUM: Magnesium: 1.9 mg/dL (ref 1.7–2.4)

## 2021-10-28 LAB — HEPARIN LEVEL (UNFRACTIONATED): Heparin Unfractionated: 0.48 IU/mL (ref 0.30–0.70)

## 2021-10-28 MED ORDER — MORPHINE SULFATE (PF) 4 MG/ML IV SOLN
4.0000 mg | INTRAVENOUS | Status: DC | PRN
  Administered 2021-10-28: 4 mg via INTRAVENOUS
  Filled 2021-10-28: qty 1

## 2021-10-28 MED ORDER — CHLORHEXIDINE GLUCONATE CLOTH 2 % EX PADS
6.0000 | MEDICATED_PAD | Freq: Every day | CUTANEOUS | Status: DC
  Administered 2021-10-28 – 2021-10-30 (×3): 6 via TOPICAL

## 2021-10-28 NOTE — Progress Notes (Signed)
Patient ID: Suzanne [name], female   DOB: October 25, 1975, 46 y.o.   MRN: [id number]     [place] [facility] Day(s): 6.   Interval History: Patient seen and examined, no acute events or new complaints overnight. Patient reports feeling much better.  Today her pain is mainly in the left side of the abdomen.  She endorses that she has significantly improved since drain was placed.  Denies any nausea or vomiting.  Tolerating soft diet.  Vital signs in last 24 hours: [min-max] current  Temp:  [98.4 F (36.9 C)-99.1 F (37.3 C)] 98.4 F (36.9 C) (07/10 0816) Pulse Rate:  [68-109] 83 (07/10 0816) Resp:  [11-31] 14 (07/10 0842) BP: (115-185)/(51-97) 139/97 (07/10 0816) SpO2:  [94 %-99 %] 96 % (07/10 0842)     Height: '5\' 7"'$  (170.2 cm) Weight: 61.2 kg BMI (Calculated): 21.14   Physical Exam:  Constitutional: alert, cooperative and no distress  Respiratory: breathing non-labored at rest  Cardiovascular: regular rate and sinus rhythm  Gastrointestinal: soft, non-tender, and non-distended  Labs:     Latest Ref Rng & Units 10/28/2021    4:00 AM 10/27/2021    6:25 AM 10/26/2021    7:01 AM  CBC  WBC 4.0 - 10.5 K/uL 17.2  18.3  27.1   Hemoglobin 12.0 - 15.0 g/dL 8.2  8.5  9.1   Hematocrit 36.0 - 46.0 % 26.8  27.5  30.1   Platelets 150 - 400 K/uL 717  699  709       Latest Ref Rng & Units 10/28/2021    4:00 AM 10/26/2021   10:40 PM 10/26/2021    7:01 AM  CMP  Glucose 70 - 99 mg/dL 120  110  103   BUN 6 - 20 mg/dL '6  7  8   '$ Creatinine 0.44 - 1.00 mg/dL 0.86  0.89  0.92   Sodium 135 - 145 mmol/L 134  133  134   Potassium 3.5 - 5.1 mmol/L 3.7  3.6  3.7   Chloride 98 - 111 mmol/L 101  102  103   CO2 22 - 32 mmol/L '25  23  22   '$ Calcium 8.9 - 10.3 mg/dL 8.0  8.1  8.5     Imaging studies: No new pertinent imaging studies   Assessment/Plan:  46 y.o. female with acute acalculous cholecystitis, complicated by pertinent comorbidities including severe undiagnosed hypercoagulable state,  chronic mesenteric ischemia, liver cirrhosis, COPD.  Acute acalculous cholecystitis -Resolved with cholecystostomy drain. -Patient tolerating soft diet -Pain significantly control -From surgical standpoint patient will not need surgical intervention.  Once medically stable she can be discharged with follow-up with IR as outpatient for cholangiogram and possible removal of drain.   Arnold Long, MD

## 2021-10-28 NOTE — Progress Notes (Addendum)
PROGRESS NOTE    Suzanne [name]  HGD:[id number] DOB: May 24, 1975 DOA: 10/22/2021 PCP: System, Provider Not In   Brief Narrative:   Suzanne [name] is a 46 y.o. 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  Patient also had some bloody bowel movements and endorsed postprandial pain. 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    During hospitalization, vascular surgery was consulted and patient was started on heparin drip.  Patient subsequently underwent mesenteric angiogram on 10/24/2021 with angioplasty but despite that continued to have excruciating abdominal pain and was started on morphine drip.  CT scan of the abdomen pelvis repeated showed infarct of the liver with acalculus cholecystitis.  Patient underwent a percutaneous cholecystostomy placement with improvement in her symptoms.  Still on morphine and heparin drip.  Assessment & Plan:   Principal Problem:   Mesenteric ischemia (HCC) Active Problems:   Abdominal pain   PVD (peripheral vascular disease) (HCC)   Hypercoagulable state (HCC)   Leg pain, right   GERD (gastroesophageal reflux disease)   Alcohol abuse   Chronic pain   COPD (chronic obstructive pulmonary disease) (HCC)   Essential hypertension   Tobacco use   Hypokalemia   NSVT (nonsustained ventricular tachycardia) (HCC)   Hepatic infarction   Acalculous cholecystitis  *Abdominal pain/mesenteric ischemia/hepatic  infarction [facility]) Patient did have a CT scan of the abdomen and pelvis with severe atherosclerotic disease.   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  Patient underwent mesenteric angiogram and percutaneous transluminal angioplasty of the celiac artery on 10/24/2021.  Patient is not a good candidate for reconstruction with very high risk for poor outcome.  Despite angioplasty patient continued to have abdominal pain and repeat CT scan showed hepatic infarct and a calculus cholecystitis.  Patient is status post percutaneous cholecystostomy.  On IV morphine drip but the overall pain has improved so we will change to as needed IV morphine at this time. Continue high intensity statins, Plavix.  Acalculous cholecystitis. Not present on admission. Shown on Repeat CT scan abdomen. On IV Zosyn.  General surgery was consulted and patient is not a candidate for surgical intervention, patient underwent IR guided percutaneous placement of cholecystostomy tube.  Leukocytosis.  Trending down.  We will continue to monitor.  On Zosyn.  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  Currently on heparin drip.  Will need Eliquis on discharge.  Might need medication assistance.    PVD (peripheral vascular disease, status post amputation of the right upper extremity. ABI shows resting ABI in the mild range of occlusive disease on the left and moderate on the right  Daily alcohol abuse Was initially on Ativan protocol. Continue thiamine folic acid.  No signs of active withdrawal at this time.  GERD (gastroesophageal reflux disease) Continue Protonix  Acute  on chronic pain, intractable abdominal pain Currently on morphine drip and  oxycodone.  We will change morphine drip to morphine IV push.  Essential hypertension Continue amlodipine.  COPD (chronic obstructive pulmonary disease) (HCC)   Continue albuterol and Pulmicort.  Appears compensated.  Hyponatremia.  Mild.  We will continue to monitor  Tobacco use Was on nicotine patch.  DVT prophylaxis: heparin gtt  Code Status: full code  Family Communication: none  Disposition: Home in 2 to 3 days,  Status is: Inpatient  Remains inpatient appropriate because:   continued abdominal pain on IV narcotics, IV heparin,    Consultants:  vascular surgery General surgery Interventional radiology  Procedures:  Mesenteric angiogram with selective celiac artery angioplasty on 10/24/2021. IR guided percutaneous cholecystostomy on 10/26/2021. PICC line placement-10/26/2021.  Antimicrobials:  Anti-infectives (From admission, onward)    Start     Dose/Rate Route Frequency Ordered Stop   10/24/21 1511  ceFAZolin (ANCEF) IVPB 1 g/50 mL premix        over 30 Minutes  Continuous PRN 10/24/21 1514 10/24/21 1756   10/24/21 1156  ceFAZolin (ANCEF) 2-4 GM/100ML-% IVPB       Note to Pharmacy: Suzanne [name]: cabinet override      10/24/21 1156 10/24/21 2359   10/24/21 1125  ceFAZolin (ANCEF) IVPB 2g/100 mL premix  Status:  Discontinued        2 g 200 mL/hr over 30 Minutes Intravenous 30 min pre-op 10/24/21 1125 10/24/21 1756   10/24/21 0600  ceFAZolin (ANCEF) IVPB 2g/100 mL premix  Status:  Discontinued        2 g 200 mL/hr over 30 Minutes Intravenous On call to O.R. 10/23/21 1548 10/24/21 2013   10/22/21 2200  piperacillin-tazobactam (ZOSYN) IVPB 3.375 g        3.375 g 12.5 mL/hr over 240 Minutes Intravenous Every 8 hours 10/22/21 2044        Subjective: Today, patient was seen and examined at bedside.  Complains of abdominal pain getting better though still persistent.  Denies any nausea vomiting fever or chills.  Objective: Vitals:   10/28/21 0428 10/28/21  0720 10/28/21 0816 10/28/21 0842  BP: (!) 146/80  (!) 139/97   Pulse: 86  83   Resp:  _0 Temp:   98.4 F (36.9 C)   TempSrc:   Oral   SpO2:  99% 98% 96%  Weight:      Height:        Intake/Output Summary (Last 24 hours) at 10/28/2021 1234 Last data filed at 10/28/2021 1013 Gross per 24 hour  Intake 2966.79 ml  Output 322 ml  Net 2644.79 ml   Filed Weights   10/22/21 1407  Weight: 61.2 kg    Physical examination: Body mass index is 21.14 kg/m.   General:  Average built, not in obvious distress, alert awake and communicative. HENT:   No scleral pallor or icterus noted. Oral mucosa is moist.  Chest:  Clear breath sounds.  Diminished breath sounds bilaterally. No crackles or wheezes.  CVS: S1 &S2 heard. No murmur.  Regular rate and rhythm. Abdomen: Soft, nonspecific tenderness on palpation, nondistended.  Bowel sounds are heard.  Percutaneous cholecystostomy tube in place Extremities: No cyanosis, clubbing or edema.  Peripheral pulses are palpable. Psych: Alert, awake and oriented, low mood CNS:  No cranial nerve deficits.  Power equal in all extremities.   Skin: Warm and dry.  No rashes noted.  Data Reviewed: I have personally reviewed following labs and  imaging studies  CBC: Recent Labs  Lab 10/24/21 0426 10/25/21 0312 10/26/21 0701 10/27/21 0625 10/28/21 0400  WBC 21.2* 26.1* 27.1* 18.3* 17.2*  NEUTROABS 16.6*  --   --   --   --   HGB 9.6* 8.9* 9.1* 8.5* 8.2*  HCT 31.1* 28.6* 30.1* 27.5* 26.8*  MCV 74.4* 74.5* 74.9* 75.3* 74.9*  PLT 764* 614* 709* 699* 767*   Basic Metabolic Panel: Recent Labs  Lab 10/24/21 0426 10/25/21 0312 10/26/21 0701 10/26/21 2240 10/28/21 0400  NA 133* 132* 134* 133* 134*  K 3.4* 3.3* 3.7 3.6 3.7  CL 101 101 103 102 101  CO2 _0 GLUCOSE 125* 128* 103* 110* 120*  BUN _1 CREATININE 0.91 0.91 0.92 0.89 0.86  CALCIUM 8.7* 8.2* 8.5* 8.1* 8.0*  MG 1.9 1.8 2.3 2.2 1.9  PHOS 3.1  --   --   --   --     GFR: Estimated Creatinine Clearance: 80.7 mL/min (by C-G formula based on SCr of 0.86 mg/dL).  Liver Function Tests: Recent Labs  Lab 10/22/21 1409 10/24/21 0426  AST 14* 28  ALT 9 17  ALKPHOS 83 79  BILITOT 0.5 0.6  PROT 7.7 7.2  ALBUMIN 3.2* 2.9*   CBG: No results for input(s): "GLUCAP" in the last 168 hours.   Recent Results (from the past 240 hour(s))  Culture, blood (Routine X 2) w Reflex to ID Panel     Status: None (Preliminary result)   Collection Time: 10/26/21  9:58 AM   Specimen: BLOOD LEFT HAND  Result Value Ref Range Status   Specimen Description BLOOD LEFT HAND  Final   Special Requests   Final    BOTTLES DRAWN AEROBIC AND ANAEROBIC Blood Culture adequate volume   Culture   Final    NO GROWTH 2 DAYS Performed at [facility], [address] [place], [place] [address]    Report Status PENDING  Incomplete  Culture, blood (Routine X 2) w Reflex to ID Panel     Status: None (Preliminary result)   Collection Time: 10/26/21 10:08 AM   Specimen: BLOOD  Result Value Ref Range Status   Specimen Description BLOOD BLOOD LEFT WRIST  Final   Special Requests   Final    BOTTLES DRAWN AEROBIC AND ANAEROBIC Blood Culture adequate volume   Culture   Final    NO GROWTH 2 DAYS Performed at [facility], [address] [place], [place] [address]    Report Status PENDING  Incomplete     Radiology Studies: IR PICC PLACEMENT LEFT >5 YRS INC IMG GUIDE  Result Date: 10/26/2021 INDICATION: Poor venous access. Please perform PICC line placement two provided for durable intravenous access for medication administration and blood draws. EXAM: ULTRASOUND AND FLUOROSCOPIC GUIDED PICC LINE INSERTION MEDICATIONS: None. CONTRAST:  None FLUOROSCOPY TIME:  6 seconds (2 mGy) COMPLICATIONS: None immediate. TECHNIQUE: The procedure, risks, benefits, and alternatives were explained to the patient and informed written consent was obtained. A timeout was performed prior  to the initiation of the procedure. The left upper extremity was prepped with chlorhexidine in a sterile fashion, and a sterile drape was applied covering the operative field. Maximum barrier sterile technique with sterile gowns and gloves were used for the procedure. A timeout was performed prior to the initiation of the procedure. Local anesthesia was provided with 1% lidocaine. Under direct ultrasound guidance, the brachial vein was accessed with a micropuncture kit after  the overlying soft tissues were anesthetized with 1% lidocaine. Real-time ultrasound guidance was utilized for vascular access including the acquisition of a permanent ultrasound image documenting patency of the accessed vessel. A guidewire was advanced to the level of the superior caval-atrial junction for measurement purposes and the PICC line was cut to length. A peel-away sheath was placed and a 6 [place], triple lumen was inserted to level of the superior caval-atrial junction. A post procedure spot fluoroscopic was obtained. The catheter easily aspirated and flushed and was secured in place with stat lock device. A dressing was applied. The patient tolerated the procedure well without immediate post procedural complication. FINDINGS: After catheter placement, the tip lies within the superior cavoatrial junction. The catheter aspirates and flushes normally and is ready for immediate use. IMPRESSION: Successful ultrasound and fluoroscopic guided placement of a left brachial vein approach, 6 French, triple lumen PICC with tip at the superior caval-atrial junction. The PICC line is ready for immediate use. Electronically Signed   By: Suzanne [name] M.D.   On: 10/26/2021 14:31   IR Perc Cholecystostomy  Result Date: 10/26/2021 INDICATION: Acute cholecystitis. Poor surgical candidate. Please perform image guided cholecystostomy tube placement for infection source control purposes. EXAM: ULTRASOUND AND FLUOROSCOPIC-GUIDED CHOLECYSTOSTOMY TUBE  PLACEMENT COMPARISON:  CT abdomen pelvis-10/25/2021 MEDICATIONS: The patient is currently admitted to the hospital and on intravenous antibiotics. Antibiotics were administered within an appropriate time frame prior to skin puncture. ANESTHESIA/SEDATION: Moderate (conscious) sedation was employed during this procedure as administered by the Interventional Radiology RN. A total of Versed 1 mg and Fentanyl 100 mcg was administered intravenously. Moderate Sedation Time: 50 minutes. The patient's level of consciousness and vital signs were monitored continuously by radiology nursing throughout the procedure under my direct supervision. CONTRAST:  8 mL OMNIPAQUE IOHEXOL 350 MG/ML SOLN - administered into the gallbladder lumen. FLUOROSCOPY TIME:  30 seconds (9.5 mGy) COMPLICATIONS: None immediate. PROCEDURE: Informed written consent was obtained from the patient after a discussion of the risks, benefits and alternatives to treatment. Questions regarding the procedure were encouraged and answered. A timeout was performed prior to the initiation of the procedure. The right upper abdominal quadrant was prepped and draped in the usual sterile fashion, and a sterile drape was applied covering the operative field. Maximum barrier sterile technique with sterile gowns and gloves were used for the procedure. A timeout was performed prior to the initiation of the procedure. Local anesthesia was provided with 1% lidocaine with epinephrine. Ultrasound scanning of the right upper quadrant demonstrates a markedly dilated gallbladder. Of note, the patient reported pain with ultrasound imaging over the gallbladder. Note, sonographic evaluation was performed of the right lobe of the liver by the dictating interventional radiologist however failed to reproduce the new ill-defined lesion involving the anterior aspect of the dome of the right lobe of the liver questioned on preceding noncontrast CT. Utilizing a transhepatic approach, a 22  gauge needle was advanced into the gallbladder under direct ultrasound guidance. An ultrasound image was saved for documentation purposes. Appropriate intraluminal puncture was confirmed with the efflux of bile and advancement of an 0.018 wire into the gallbladder lumen. The needle was exchanged for an [place] set. A small amount of contrast was injected to confirm appropriate intraluminal positioning. Over a [place] wire, a 32.2-French [place] cholecystomy tube was advanced into the gallbladder fossa, coiled and locked. Bile was aspirated and a small amount of contrast was injected as several post procedural spot radiographic images were obtained in various obliquities. The  catheter was secured to the skin with suture, connected to a drainage bag and a dressing was placed. The patient tolerated the procedure well without immediate post procedural complication. IMPRESSION: Successful ultrasound and fluoroscopic guided placement of a 10.2 French cholecystostomy tube. Note, sonographic evaluation of the right lobe of the liver failed to confidently identified ill-defined hypoattenuating lesion involving the dome of the anterior segment of the right lobe of the liver questioned on preceding noncontrast abdominal CT. Electronically Signed   By: Suzanne [name] M.D.   On: 10/26/2021 14:27    Scheduled Meds:  amLODipine  5 mg Oral Daily   atorvastatin  80 mg Oral Daily   Chlorhexidine Gluconate Cloth  6 each Topical Q0600   clopidogrel  75 mg Oral Q breakfast   docusate sodium  100 mg Oral BID   folic acid  1 mg Oral Daily   multivitamin with minerals  1 tablet Oral Daily   nicotine  21 mg Transdermal Daily   pantoprazole (PROTONIX) IV  40 mg Intravenous Q12H   polyethylene glycol  17 g Oral BID   sodium chloride flush  3 mL Intravenous Q12H   sodium chloride flush  5 mL Intracatheter Q8H   thiamine  100 mg Oral Daily   Or   thiamine  100 mg Intravenous Daily   Continuous Infusions:  sodium chloride      dextrose 5% lactated ringers 75 mL/hr at 10/28/21 0701   heparin 1,500 Units/hr (10/28/21 0701)   piperacillin-tazobactam (ZOSYN)  IV 3.375 g (10/28/21 0905)   promethazine (PHENERGAN) injection (IM or IVPB)       LOS: 6 days   Flora Lipps, MD [facility] 10/28/2021, 12:34 PM

## 2021-10-28 NOTE — Progress Notes (Signed)
Referring Physician(s): Dr Herbert Pun  Supervising Physician: Daryll Brod  Patient Status:  [name] - In-pt  Chief Complaint:  S/p percutaneous cholecystostomy on 7/8 with Dr Pascal Lux  Subjective:  Patient is sitting upright in bed eating lunch at time of exam. She states she feels significantly better after the procedure performed over the weekend, and reports that her abdominal pain is almost completely gone. She does note some occasional minor pain near the insertion site if she is lying for too long on her right side. She expresses that she is eager to return to her home.  Allergies: Patient has no known allergies.  Medications: Prior to Admission medications   Medication Sig Start Date End Date Taking? Authorizing Provider  albuterol (PROVENTIL HFA;VENTOLIN HFA) 108 (90 Base) MCG/ACT inhaler Inhale 2 puffs into the lungs every 6 (six) hours as needed for wheezing or shortness of breath. Patient not taking: Reported on 10/22/2021 11/21/16   Merlyn Lot, MD  apixaban (ELIQUIS) 5 MG TABS tablet Take 1 tablet (5 mg total) by mouth 2 (two) times daily. 10/25/21 01/23/22  Pokhrel, Corrie Mckusick, MD  aspirin EC 81 MG tablet Take 1 tablet (81 mg total) by mouth daily. Patient not taking: Reported on 10/22/2021 05/28/20   Algernon Huxley, MD  atorvastatin (LIPITOR) 20 MG tablet Take 1 tablet (20 mg total) by mouth daily. Patient not taking: Reported on 10/22/2021 08/21/20   Marcelle Overlie A, PA-C     Vital Signs: BP (!) 139/97 (BP Location: Left Leg)   Pulse 83   Temp 98.4 F (36.9 C) (Oral)   Resp 14   Ht 5' 7"  (1.702 m)   Wt 135 lb (61.2 kg)   LMP 10/08/2021   SpO2 96%   BMI 21.14 kg/m   Physical Exam Vitals reviewed.  Constitutional:      General: She is not in acute distress.    Appearance: She is normal weight. She is not ill-appearing.  Cardiovascular:     Rate and Rhythm: Normal rate and regular rhythm.     Heart sounds: Normal heart sounds.  Pulmonary:      Effort: Pulmonary effort is normal.     Breath sounds: Wheezing present.     Comments: Slight wheezing noted on left side Skin:    General: Skin is warm and dry.     Comments: Drain insertion site is non-erythematous, no drainage present at site, suture and dressings in place; dressings are clean, dry, and intact  Neurological:     Mental Status: She is alert and oriented to person, place, and time.  Psychiatric:        Behavior: Behavior normal.    Drain Location: RUQ Size: Fr size: 10 Fr Date of placement: 10/26/21  Currently to: Drain collection device: gravity 24 hour output:  Output by Drain (mL) 10/26/21 0701 - 10/26/21 1900 10/26/21 1901 - 10/27/21 0700 10/27/21 0701 - 10/27/21 1900 10/27/21 1901 - 10/28/21 0700 10/28/21 0701 - 10/28/21 1458  Biliary Tube Cook slip-coat 10.2 Fr. RUQ 150 280 210 110     Interval imaging/drain manipulation:  None  Current examination: Flushes/aspirates easily.  Insertion site unremarkable. Suture and stat lock in place. Dressed appropriately.  Scant yellow-orange fluid noted in bag at time of exam    Imaging: IR PICC PLACEMENT LEFT >5 YRS INC IMG GUIDE  Result Date: 10/26/2021 INDICATION: Poor venous access. Please perform PICC line placement two provided for durable intravenous access for medication administration and blood draws. EXAM: ULTRASOUND AND  FLUOROSCOPIC GUIDED PICC LINE INSERTION MEDICATIONS: None. CONTRAST:  None FLUOROSCOPY TIME:  6 seconds (2 mGy) COMPLICATIONS: None immediate. TECHNIQUE: The procedure, risks, benefits, and alternatives were explained to the patient and informed written consent was obtained. A timeout was performed prior to the initiation of the procedure. The left upper extremity was prepped with chlorhexidine in a sterile fashion, and a sterile drape was applied covering the operative field. Maximum barrier sterile technique with sterile gowns and gloves were used for the procedure. A timeout was performed prior  to the initiation of the procedure. Local anesthesia was provided with 1% lidocaine. Under direct ultrasound guidance, the brachial vein was accessed with a micropuncture kit after the overlying soft tissues were anesthetized with 1% lidocaine. Real-time ultrasound guidance was utilized for vascular access including the acquisition of a permanent ultrasound image documenting patency of the accessed vessel. A guidewire was advanced to the level of the superior caval-atrial junction for measurement purposes and the PICC line was cut to length. A peel-away sheath was placed and a 6 [place], triple lumen was inserted to level of the superior caval-atrial junction. A post procedure spot fluoroscopic was obtained. The catheter easily aspirated and flushed and was secured in place with stat lock device. A dressing was applied. The patient tolerated the procedure well without immediate post procedural complication. FINDINGS: After catheter placement, the tip lies within the superior cavoatrial junction. The catheter aspirates and flushes normally and is ready for immediate use. IMPRESSION: Successful ultrasound and fluoroscopic guided placement of a left brachial vein approach, 6 French, triple lumen PICC with tip at the superior caval-atrial junction. The PICC line is ready for immediate use. Electronically Signed   By: Sandi Mariscal M.D.   On: 10/26/2021 14:31   IR Perc Cholecystostomy  Result Date: 10/26/2021 INDICATION: Acute cholecystitis. Poor surgical candidate. Please perform image guided cholecystostomy tube placement for infection source control purposes. EXAM: ULTRASOUND AND FLUOROSCOPIC-GUIDED CHOLECYSTOSTOMY TUBE PLACEMENT COMPARISON:  CT abdomen pelvis-10/25/2021 MEDICATIONS: The patient is currently admitted to the [name] and on intravenous antibiotics. Antibiotics were administered within an appropriate time frame prior to skin puncture. ANESTHESIA/SEDATION: Moderate (conscious) sedation was employed  during this procedure as administered by the Interventional Radiology RN. A total of Versed 1 mg and Fentanyl 100 mcg was administered intravenously. Moderate Sedation Time: 50 minutes. The patient's level of consciousness and vital signs were monitored continuously by radiology nursing throughout the procedure under my direct supervision. CONTRAST:  8 mL OMNIPAQUE IOHEXOL 350 MG/ML SOLN - administered into the gallbladder lumen. FLUOROSCOPY TIME:  30 seconds (9.5 mGy) COMPLICATIONS: None immediate. PROCEDURE: Informed written consent was obtained from the patient after a discussion of the risks, benefits and alternatives to treatment. Questions regarding the procedure were encouraged and answered. A timeout was performed prior to the initiation of the procedure. The right upper abdominal quadrant was prepped and draped in the usual sterile fashion, and a sterile drape was applied covering the operative field. Maximum barrier sterile technique with sterile gowns and gloves were used for the procedure. A timeout was performed prior to the initiation of the procedure. Local anesthesia was provided with 1% lidocaine with epinephrine. Ultrasound scanning of the right upper quadrant demonstrates a markedly dilated gallbladder. Of note, the patient reported pain with ultrasound imaging over the gallbladder. Note, sonographic evaluation was performed of the right lobe of the liver by the dictating interventional radiologist however failed to reproduce the new ill-defined lesion involving the anterior aspect of the dome of the  right lobe of the liver questioned on preceding noncontrast CT. Utilizing a transhepatic approach, a 22 gauge needle was advanced into the gallbladder under direct ultrasound guidance. An ultrasound image was saved for documentation purposes. Appropriate intraluminal puncture was confirmed with the efflux of bile and advancement of an 0.018 wire into the gallbladder lumen. The needle was exchanged for  an [place] set. A small amount of contrast was injected to confirm appropriate intraluminal positioning. Over a Benson wire, a 5.2-French Cook cholecystomy tube was advanced into the gallbladder fossa, coiled and locked. Bile was aspirated and a small amount of contrast was injected as several post procedural spot radiographic images were obtained in various obliquities. The catheter was secured to the skin with suture, connected to a drainage bag and a dressing was placed. The patient tolerated the procedure well without immediate post procedural complication. IMPRESSION: Successful ultrasound and fluoroscopic guided placement of a 10.2 French cholecystostomy tube. Note, sonographic evaluation of the right lobe of the liver failed to confidently identified ill-defined hypoattenuating lesion involving the dome of the anterior segment of the right lobe of the liver questioned on preceding noncontrast abdominal CT. Electronically Signed   By: Sandi Mariscal M.D.   On: 10/26/2021 14:27   [place] EKG SITE RITE  Result Date: 10/26/2021 If Site Rite image not attached, placement could not be confirmed due to current cardiac rhythm.  CT CHEST ABDOMEN PELVIS W CONTRAST  Result Date: 10/25/2021 CLINICAL DATA:  Vasculitis suspected, mesenteric ischemia EXAM: CT CHEST, ABDOMEN, AND PELVIS WITH CONTRAST TECHNIQUE: Multidetector CT imaging of the chest, abdomen and pelvis was performed following the standard protocol during bolus administration of intravenous contrast. RADIATION DOSE REDUCTION: This exam was performed according to the departmental dose-optimization program which includes automated exposure control, adjustment of the mA and/or kV according to patient size and/or use of iterative reconstruction technique. CONTRAST:  154m OMNIPAQUE IOHEXOL 300 MG/ML  SOLN COMPARISON:  CT angiogram chest abdomen pelvis, 10/22/2021 FINDINGS: CT CHEST FINDINGS Cardiovascular: Scattered aortic atherosclerosis. Redemonstrated  extensive, irregular mural thrombus throughout the descending thoracic aorta, contiguous with the upper abdominal aorta. Normal heart size. Left coronary artery calcifications. No pericardial effusion. Mediastinum/Nodes: No enlarged mediastinal, hilar, or axillary lymph nodes. Small hiatal hernia. Thyroid gland, trachea, and esophagus demonstrate no significant findings. Lungs/Pleura: Mild, predominantly paraseptal emphysema. Diffuse bilateral bronchial wall thickening. No pleural effusion or pneumothorax. Musculoskeletal: No chest wall mass or suspicious osseous lesions identified. CT ABDOMEN PELVIS FINDINGS Hepatobiliary: New hypodense subcapsular lesion of the anterior right lobe of the liver, hepatic segment VIII, measuring 2.8 x 2.0 cm (series 2, image 52). Distended gallbladder with pericholecystic fat stranding (series 2, image 73). Gallbladder wall thickening, or biliary dilatation. Pancreas: Unremarkable. No pancreatic ductal dilatation or surrounding inflammatory changes. Spleen: Chronically infarcted, atrophic spleen. Adrenals/Urinary Tract: Adrenal glands are unremarkable. Lobulated bilateral renal cortex, consistent with prior infarction. Kidneys are otherwise normal, without renal calculi, solid lesion, or hydronephrosis. Bladder is unremarkable. Stomach/Bowel: Stomach is within normal limits. Appendix appears normal. No evidence of bowel wall thickening, distention, or inflammatory changes. Vascular/Lymphatic: Aortic atherosclerosis. Large burden of irregular thrombus throughout the abdominal aorta. 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 The superior mesenteric artery remains patent. No enlarged abdominal or pelvic lymph nodes. Reproductive: No mass or other abnormality. Other: No abdominal wall hernia or abnormality. No ascites. Musculoskeletal: No acute osseous findings. IMPRESSION: 1. Redemonstrated extensive, irregular mural  thrombus throughout the thoracic and abdominal aorta on  this non tailored, non angiographic examination. 2. 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 The superior mesenteric artery remains patent. 3. New hypodense subcapsular lesion of the anterior right lobe of the liver, hepatic segment VIII, measuring 2.8 x 2.0 cm. This is concerning for a hepatic infarction given interval development, despite this being a very uncommon entity. 4. Distended gallbladder with pericholecystic fat stranding, consistent with acute cholecystitis, as above possibly secondary to ischemia. 5. Emphysema and diffuse bilateral bronchial wall thickening. 6. Aortic atherosclerosis, advanced for patient age. These results will be called to the ordering clinician or representative by the Radiologist Assistant, and communication documented in the PACS or [employer]. Aortic Atherosclerosis (ICD10-I70.0) and Emphysema (ICD10-J43.9). Electronically Signed   By: Delanna Ahmadi M.D.   On: 10/25/2021 19:39   DG Abd 1 View  Result Date: 10/25/2021 CLINICAL DATA:  Abdominal pain and tenderness for several weeks EXAM: ABDOMEN - 1 VIEW COMPARISON:  10/22/2021 FINDINGS: Supine frontal view of the abdomen and pelvis excludes the left hemidiaphragm by collimation. Bowel gas pattern is unremarkable without obstruction or ileus. There are no masses or abnormal calcifications. No acute bony abnormalities. 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 IMPRESSION: 1. Unremarkable bowel gas pattern. Electronically Signed   By: Randa Ngo M.D.   On: 10/25/2021 15:41   PERIPHERAL VASCULAR CATHETERIZATION  Result Date: 10/24/2021 See surgical note for result.   Labs:  CBC: Recent Labs    10/25/21 0312 10/26/21 0701 10/27/21 0625 10/28/21 0400  WBC 26.1* 27.1* 18.3* 17.2*  HGB 8.9* 9.1* 8.5* 8.2*  HCT 28.6* 30.1* 27.5* 26.8*  PLT 614* 709* 699* 717*    COAGS: Recent Labs     10/22/21 1823 10/23/21 0108  INR 1.1  --   APTT 30 55*    BMP: Recent Labs    10/25/21 0312 10/26/21 0701 10/26/21 2240 10/28/21 0400  NA 132* 134* 133* 134*  K 3.3* 3.7 3.6 3.7  CL 101 103 102 101  CO2 25 22 23 25   GLUCOSE 128* 103* 110* 120*  BUN 6 8 7 6   CALCIUM 8.2* 8.5* 8.1* 8.0*  CREATININE 0.91 0.92 0.89 0.86  GFRNONAA >60 >60 >60 >60    LIVER FUNCTION TESTS: Recent Labs    10/22/21 1409 10/24/21 0426  BILITOT 0.5 0.6  AST 14* 28  ALT 9 17  ALKPHOS 83 79  PROT 7.7 7.2  ALBUMIN 3.2* 2.9*    Assessment and Plan:  [name] is a 46 yo female s/p percutaneous cholecystostomy drain placement on 10/26/21 by Dr Pascal Lux. Patient is tolerating drain well and reporting significant improvement in pain. Approximately 320 mL of yellow-orange output in last 24 hours.    Plan: Cholecystostomy tube will need to stay in place 6-8 weeks OR until interval cholecystectomy. Follow-up with surgery, if deemed a poor surgical candidate at that time, IR can perform cholecystostomy tube exchanges every 6-8 weeks. Drain catheter has retained suture within catheter, prior to removal of drain cut off the hub to release the retention suture forming the pigtail.  Continue TID flushes with 5 cc NS. Record output Q shift. Dressing changes QD or PRN if soiled.  Call IR APP or on call IR MD if difficulty flushing or sudden change in drain output.  Repeat imaging/possible drain injection once output < 10 mL/QD (excluding flush material). Consideration for drain removal if output is < 10 mL/QD (excluding flush material), pending discussion with the providing surgical service.  Discharge planning:  Please contact IR APP or on call IR MD prior to patient d/c to ensure appropriate follow up plans are in place. Typically patient will follow up with IR clinic 10-14 days post d/c for repeat imaging/possible drain injection. IR scheduler will contact patient with date/time of appointment. Patient  will need to flush drain QD with 5 cc NS, record output QD, dressing changes every 2-3 days or earlier if soiled.   IR will continue to follow - please call with questions or concerns. Electronically Signed: Lura Em, PA-C 10/28/2021, 2:52 PM   I spent a total of 15 Minutes at the the patient's bedside AND on the patient's [name] floor or unit, greater than 50% of which was counseling/coordinating care for s/p percutaneous cholecystostomy drain placement 10/26/21.

## 2021-10-28 NOTE — Consult Note (Signed)
ANTICOAGULATION CONSULT NOTE - Follow Up Consult  Pharmacy Consult for Heparin gtt Indication:  aortic thrombus/MI  No Known Allergies  Patient Measurements: Height: '5\' 7"'$  (170.2 cm) Weight: 61.2 kg (135 lb) IBW/kg (Calculated) : 61.6 Heparin Dosing Weight: 61.2kg  Vital Signs: Temp: 99.1 F (37.3 C) (07/10 0401) Temp Source: Oral (07/10 0401) BP: 146/80 (07/10 0428) Pulse Rate: 86 (07/10 0428)  Labs: Recent Labs    10/26/21 0701 10/26/21 1458 10/26/21 2240 10/27/21 0625 10/28/21 0400  HGB 9.1*  --   --  8.5* 8.2*  HCT 30.1*  --   --  27.5* 26.8*  PLT 709*  --   --  699* 717*  HEPARINUNFRC 0.64   < > 0.42 0.57 0.48  CREATININE 0.92  --  0.89  --  0.86   < > = values in this interval not displayed.   Heparin Dosing Weight: 61.2kg  Estimated Creatinine Clearance: 80.7 mL/min (by C-G formula based on SCr of 0.86 mg/dL).  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 Pharmacy consulted for mgmt of heparin gtt.  Date Time aPTT/HL Rate/Comment 7/5 0702 HL 0.22 SUBtherapeutic @ 1100 units/hr     7/5 1714 HL 0.45 Therapeutic x1 @ 1250 units/hr 7/6      0426    HL 0.35           Therapeutic X 3 @ 1250 units/hr 7/6 1246 - 2100  HEPARIN STOPPED FOR PROCEDURE 7/7      0312    HL 0.24           Subtherapeutic 7/7 1039 HL < 0.1 Subtherapeutic 7/7 1330 HL < 0.1 (repeat)Subtherapeutic 7/7      2306    HL =  0.28       Subtherapeutic  7/8      0701    HL =  0.64       Therapeutic x 1 7/8      1458    HL < 0.1      Heparin held for procedure 7/8      2240    HL = 0.42        Therapeutic X 1  7/9      0625    HL = 0.57        Therapeutic X 2 7/10     0400    HL = 0.48        Therapeutic X 3   Baseline Labs: aPTT - pending INR - pending HL - pending Hgb - 10.6 Plts - 784  Goal of Therapy:  Heparin level 0.3-0.7 units/ml aPTT 66-102 seconds Monitor platelets by anticoagulation protocol: Yes   Plan:  7/10:  HL @ 0400 = 0.48, therapeutic X 3 Will continue pt on current rate and recheck HL on 7/11 with AM labs.   Wyman Meschke D 10/28/2021 4:31 AM

## 2021-10-29 DIAGNOSIS — R1084 Generalized abdominal pain: Secondary | ICD-10-CM | POA: Diagnosis not present

## 2021-10-29 DIAGNOSIS — Z7189 Other specified counseling: Secondary | ICD-10-CM | POA: Diagnosis not present

## 2021-10-29 DIAGNOSIS — K559 Vascular disorder of intestine, unspecified: Secondary | ICD-10-CM | POA: Diagnosis not present

## 2021-10-29 DIAGNOSIS — G8929 Other chronic pain: Secondary | ICD-10-CM | POA: Diagnosis not present

## 2021-10-29 DIAGNOSIS — F1010 Alcohol abuse, uncomplicated: Secondary | ICD-10-CM | POA: Diagnosis not present

## 2021-10-29 LAB — COMPREHENSIVE METABOLIC PANEL
ALT: 108 U/L — ABNORMAL HIGH (ref 0–44)
AST: 64 U/L — ABNORMAL HIGH (ref 15–41)
Albumin: 2.4 g/dL — ABNORMAL LOW (ref 3.5–5.0)
Alkaline Phosphatase: 136 U/L — ABNORMAL HIGH (ref 38–126)
Anion gap: 9 (ref 5–15)
BUN: 5 mg/dL — ABNORMAL LOW (ref 6–20)
CO2: 24 mmol/L (ref 22–32)
Calcium: 8.2 mg/dL — ABNORMAL LOW (ref 8.9–10.3)
Chloride: 103 mmol/L (ref 98–111)
Creatinine, Ser: 0.93 mg/dL (ref 0.44–1.00)
GFR, Estimated: >60 mL/min (ref >60)
Glucose, Bld: 112 mg/dL — ABNORMAL HIGH (ref 70–99)
Potassium: 3.5 mmol/L (ref 3.5–5.1)
Sodium: 136 mmol/L (ref 135–145)
Total Bilirubin: 0.2 mg/dL — ABNORMAL LOW (ref 0.3–1.2)
Total Protein: 6.8 g/dL (ref 6.5–8.1)

## 2021-10-29 LAB — CBC
HCT: 26.7 % — ABNORMAL LOW (ref 36.0–46.0)
Hemoglobin: 8.2 g/dL — ABNORMAL LOW (ref 12.0–15.0)
MCH: 22.8 pg — ABNORMAL LOW (ref 26.0–34.0)
MCHC: 30.7 g/dL (ref 30.0–36.0)
MCV: 74.2 fL — ABNORMAL LOW (ref 80.0–100.0)
Platelets: 777 10*3/uL — ABNORMAL HIGH (ref 150–400)
RBC: 3.6 MIL/uL — ABNORMAL LOW (ref 3.87–5.11)
RDW: 18.6 % — ABNORMAL HIGH (ref 11.5–15.5)
WBC: 18.2 10*3/uL — ABNORMAL HIGH (ref 4.0–10.5)
nRBC: 0 % (ref 0.0–0.2)

## 2021-10-29 LAB — HEPARIN LEVEL (UNFRACTIONATED): Heparin Unfractionated: 0.5 IU/mL (ref 0.30–0.70)

## 2021-10-29 LAB — MAGNESIUM: Magnesium: 2.2 mg/dL (ref 1.7–2.4)

## 2021-10-29 MED ORDER — ACETAMINOPHEN 325 MG PO TABS
325.0000 mg | ORAL_TABLET | ORAL | Status: DC | PRN
  Administered 2021-10-29 (×2): 325 mg via ORAL
  Filled 2021-10-29 (×3): qty 1

## 2021-10-29 MED ORDER — OXYCODONE HCL 5 MG PO TABS
10.0000 mg | ORAL_TABLET | ORAL | Status: DC | PRN
  Administered 2021-10-29 – 2021-10-30 (×7): 10 mg via ORAL
  Filled 2021-10-29 (×7): qty 2

## 2021-10-29 MED ORDER — APIXABAN 5 MG PO TABS
5.0000 mg | ORAL_TABLET | Freq: Two times a day (BID) | ORAL | Status: DC
Start: 2021-10-29 — End: 2021-10-30
  Administered 2021-10-29 – 2021-10-30 (×3): 5 mg via ORAL
  Filled 2021-10-29 (×3): qty 1

## 2021-10-29 MED ORDER — MORPHINE SULFATE (PF) 2 MG/ML IV SOLN: 2.0000 mg | INTRAVENOUS | Status: DC | PRN

## 2021-10-29 NOTE — Consult Note (Addendum)
Consultation Note Date: 10/29/2021   Patient Name: Suzanne [name]  DOB: 03-04-1976  MRN: [id number]  Age / Sex: 46 y.o., female  PCP: System, Provider Not In Referring Physician: Flora Lipps, MD  Reason for Consultation: Establishing goals of care  HPI/Patient Profile:   Suzanne [name] is a 46 y.o. 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  Patient also had some bloody bowel movements and endorsed postprandial pain. 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     During hospitalization, vascular surgery was consulted and patient was started on heparin drip.  Patient subsequently underwent mesenteric angiogram on 10/24/2021 with angioplasty but despite that continued to have excruciating abdominal pain and was started on morphine drip.  CT scan of the abdomen pelvis repeated showed infarct of the liver with acalculus cholecystitis.  Patient then underwent a percutaneous cholecystostomy placement with improvement in her symptoms.   Clinical Assessment and Goals of Care: Notes and labs reviewed. Patient is sitting in bed. No family at bedside. She states she has 3 children, 2 in their early 20's and one who just turned 45 years old.   She lives at home with her boyfriend. We talk in detail about family and dynamics. She discusses her physical limitations, and  that she is trying to get disability. She discusses finances and environment.   We discussed her diagnosis, prognosis, GOC, EOL wishes disposition and options.  Created space and opportunity for patient  to explore thoughts and feelings regarding current medical information. She discusses her surgeries and limitations given fatigue from her health issues. She discusses her pain, which she states is now well controlled.   A detailed discussion was had today regarding advanced directives.  Concepts specific to code status, artifical feeding and hydration, IV antibiotics and rehospitalization were discussed.  The difference between an aggressive medical intervention path and a comfort care path was discussed.  Values and goals of care important to patient and family were attempted to be elicited.  Discussed limitations of medical interventions to prolong quality of life in some situations and discussed the concept of human mortality. She is a person of faith. She states she believes God is in control.   She states she wants all care possible, initially, to live for her children. She states she would want a chance to improve, but would not want to live in a vegetative. She states she would not want to be a burden on her children, and has made it clear to her children she would want to be cremated when she dies. She is hopeful  to live to see her 45 year old grow up but understands her vascular status and the possibilities, particularly without Eliquis. We discussed her using medications such as Eliquis as advised, and ETOH and tobacco cessation.   She requests help with setting up her patient portal and dicussed I would speak with chaplain about any options for service they may be aware of.       SUMMARY OF RECOMMENDATIONS   PMT will follow up tomorrow       Primary Diagnoses: Present on Admission:  Mesenteric ischemia ([place])   I have reviewed the medical record, interviewed the patient and  family, and examined the patient. The following aspects are pertinent.  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   Social History   Socioeconomic History   Marital status: Divorced    Spouse name: Not on file   Number of children: Not on file   Years of education: Not on file   Highest education level: Not on file  Occupational History   Not on file  Tobacco Use   Smoking status: Every Day    Packs/day: 0.50    Years: 20.00    Total pack years: 10.00    Types: Cigarettes    Last attempt to quit: 07/15/2020    Years since quitting: 1.2   Smokeless tobacco: Never  Vaping Use   Vaping Use: Never used  Substance and Sexual Activity   Alcohol use: Not Currently    Alcohol/week: 2.0 standard drinks of alcohol    Types: 2 Cans of beer per week    Comment: 1-2 days a week per patient. husband says more often   Drug use: No   Sexual activity: Not on file  Other Topics Concern   Not on file  Social History Narrative   Not on file   Social Determinants of Health   Financial Resource Strain: Not on file  Food Insecurity: Not on file  Transportation Needs: Not on file  Physical Activity: Not on file  Stress: Not on file  Social Connections: Not on file   Family History  Adopted: Yes  Problem Relation Age of Onset   Hypertension Mother    Scheduled Meds:  amLODipine  5 mg Oral Daily   apixaban  5 mg Oral BID   atorvastatin  80 mg Oral Daily   Chlorhexidine Gluconate Cloth  6 each Topical Q0600   clopidogrel  75 mg Oral Q breakfast   docusate sodium  100 mg Oral BID   folic acid  1 mg Oral Daily   multivitamin with minerals  1 tablet Oral Daily   nicotine  21 mg Transdermal Daily   pantoprazole (PROTONIX) IV  40 mg Intravenous Q12H   polyethylene glycol  17 g Oral BID   sodium chloride flush  3 mL Intravenous Q12H   sodium chloride flush  5 mL Intracatheter Q8H    thiamine  100 mg Oral Daily   Or   thiamine  100 mg Intravenous Daily   Continuous Infusions:  sodium chloride     dextrose 5% lactated ringers 75 mL/hr at 10/28/21 1700   piperacillin-tazobactam (ZOSYN)  IV 3.375 g (10/29/21 1556)   promethazine (PHENERGAN) injection (IM or IVPB)     PRN Meds:.sodium chloride, acetaminophen **OR** acetaminophen, oxyCODONE **AND** acetaminophen, albuterol, hydrALAZINE, iohexol, lidocaine (PF), morphine injection, promethazine (PHENERGAN) injection (IM or IVPB), sodium  chloride flush Medications Prior to Admission:  Prior to Admission medications   Medication Sig Start Date End Date Taking? Authorizing Provider  albuterol (PROVENTIL HFA;VENTOLIN HFA) 108 (90 Base) MCG/ACT inhaler Inhale 2 puffs into the lungs every 6 (six) hours as needed for wheezing or shortness of breath. Patient not taking: Reported on 10/22/2021 11/21/16   Merlyn Lot, MD  apixaban (ELIQUIS) 5 MG TABS tablet Take 1 tablet (5 mg total) by mouth 2 (two) times daily. 10/25/21 01/23/22  Pokhrel, Corrie Mckusick, MD  aspirin EC 81 MG tablet Take 1 tablet (81 mg total) by mouth daily. Patient not taking: Reported on 10/22/2021 05/28/20   Algernon Huxley, MD  atorvastatin (LIPITOR) 20 MG tablet Take 1 tablet (20 mg total) by mouth daily. Patient not taking: Reported on 10/22/2021 08/21/20   Stegmayer, Joelene Millin A, PA-C   No Known Allergies Review of Systems  Constitutional:  Positive for activity change and fatigue.    Physical Exam Pulmonary:     Effort: Pulmonary effort is normal.  Neurological:     Mental Status: She is alert.     Vital Signs: BP (!) 149/60 (BP Location: Left Leg)   Pulse 87   Temp 98.4 F (36.9 C)   Resp 16   Ht '5\' 7"'$  (1.702 m)   Wt 61.2 kg   LMP 10/08/2021   SpO2 96%   BMI 21.14 kg/m  Pain Scale: 0-10 POSS *See Group Information*: S-Acceptable,Sleep, easy to arouse Pain Score: Asleep   SpO2: SpO2: 96 % O2 Device:SpO2: 96 % O2 Flow Rate: .O2 Flow Rate (L/min): 2  L/min  IO: Intake/output summary:  Intake/Output Summary (Last 24 hours) at 10/29/2021 1630 Last data filed at 10/29/2021 1149 Gross per 24 hour  Intake 1229.24 ml  Output 100 ml  Net 1129.24 ml    LBM: Last BM Date : 10/28/21 Baseline Weight: Weight: 61.2 kg Most recent weight: Weight: 61.2 kg       Asencion Gowda, NP   Please contact Palliative Medicine Team phone at [phone] for questions and concerns.  For individual provider: See Shea Evans

## 2021-10-29 NOTE — Progress Notes (Addendum)
PROGRESS NOTE    Suzanne [name]  IDP:[id number] DOB: 1975/08/12 DOA: 10/22/2021 PCP: System, Provider Not In   Brief Narrative:   Suzanne [name] is a 46 y.o. 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  Patient also had some bloody bowel movements and endorsed postprandial pain. 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    During hospitalization, vascular surgery was consulted and patient was started on heparin drip.  Patient subsequently underwent mesenteric angiogram on 10/24/2021 with angioplasty but despite that continued to have excruciating abdominal pain and was started on morphine drip.  CT scan of the abdomen pelvis repeated showed infarct of the liver with acalculus cholecystitis.  Patient then underwent a percutaneous cholecystostomy placement with improvement in her symptoms.  At this time, morphine drip has been discontinued and is being weaned off on IV morphine.  Oral narcotics have been initiated and adjusted.  Patient will need to continue cholecystostomy tube on discharge.  Assessment & Plan:   Principal Problem:   Mesenteric ischemia (HCC) Active Problems:   Abdominal pain   PVD (peripheral vascular disease) (HCC)   Hypercoagulable state (HCC)   Leg pain, right   GERD (gastroesophageal reflux disease)   Alcohol abuse   Chronic pain   COPD (chronic obstructive pulmonary disease) (HCC)   Essential hypertension   Tobacco  use   Hypokalemia   NSVT (nonsustained ventricular tachycardia) (HCC)   Hepatic infarction   Acalculous cholecystitis  *Abdominal pain/mesenteric ischemia/hepatic infarction [facility]) Patient did have a CT scan of the abdomen and pelvis with severe atherosclerotic disease.   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  Patient underwent mesenteric angiogram and percutaneous transluminal angioplasty of the celiac artery on 10/24/2021.  Patient is not a good candidate for reconstruction with very high risk for poor outcome.  Despite angioplasty, patient continued to have abdominal pain and repeat CT scan showed hepatic infarct and acalculus cholecystitis.  Patient is status post percutaneous cholecystostomy on 10/26/2021 and has been followed by interventional radiology at this time.  Plan is to continue to for 6 to 8 weeks.  Will need to let IR know prior to disposition and will have to follow-up in the IR clinic 10 to 14 days after discharge for repeat imaging possible drain injection.  Pain has overall improved.  Off morphine drip and currently on IV morphine we will try to wean IV morphine and transition to oral narcotics.  Acalculous cholecystitis. Not present on admission. Shown on Repeat CT scan abdomen.  Patient received IV Zosyn during hospitalization.  General surgery was consulted and patient is not a candidate for surgical intervention, patient underwent IR guided percutaneous placement of cholecystostomy tube.  Will need to continue cholecystostomy tube for 6 to 8 weeks and follow-up with IR clinic 10 to 14 days after discharge.  Patient still has leukocytosis spoke with general surgery Dr. Peyton Najjar.  Likely reactive but recommend Augmentin to complete the course.  Leukocytosis.  On IV Zosyn.  Still elevated at 18.2 from 17.2.  We will need to trend.  Hypercoagulable state with history of renal and splenic infarct in 2016 with aortic thrombus in  2016, DVT 2020 and extremity ischemia 2022 [facility]) Not clear about the etiology but patient had seen heme-onc oncology at [facility] on 10/02/2020 who had recommended Eliquis at this time.  Currently on heparin drip.  Will need Eliquis on discharge.  TOC aware.  We will transition to Eliquis starting today.  PVD (peripheral vascular disease, status post amputation of the right upper extremity. ABI shows resting ABI in the mild range of occlusive disease on the left and moderate on the right  Daily alcohol abuse Was initially on Ativan protocol. Continue thiamine folic acid.  No signs of active withdrawal at this time.  GERD (gastroesophageal reflux disease) Continue Protonix  Acute on chronic pain, intractable abdominal pain Currently on morphine IV and oxycodone.  Adjusting pain medication regimen at this time  Essential hypertension Continue amlodipine low-dose..  Blood pressure seems to be stable at this time.  COPD (chronic obstructive pulmonary disease) (HCC)   Continue albuterol and Pulmicort.  Appears compensated.  Hyponatremia.  Improved.  Latest sodium of 136.  Tobacco use Continue nicotine patch  DVT prophylaxis: heparin gtt, will transition to Eliquis starting today  Code Status: full code  Family Communication: Patient's boyfriend at bedside.  Disposition: Home likely tomorrow if she continues to improve.  We will ambulate the patient today  Status is: Inpatient  Remains inpatient appropriate because:   continued abdominal pain on IV narcotics, IV heparin,    Consultants:  Vascular surgery General surgery Interventional radiology  Procedures:  Mesenteric angiogram with selective celiac artery angioplasty on 10/24/2021. IR guided percutaneous cholecystostomy on 10/26/2021. PICC line placement-10/26/2021.  Antimicrobials:  Anti-infectives (From admission, onward)    Start     Dose/Rate Route Frequency Ordered Stop   10/24/21 1511  ceFAZolin (ANCEF) IVPB 1 g/50 mL premix         over 30 Minutes  Continuous PRN 10/24/21 1514 10/24/21 1756   10/24/21 1156  ceFAZolin (ANCEF) 2-4 GM/100ML-% IVPB       Note to Pharmacy: Corlis Hove H: cabinet override      10/24/21 1156 10/24/21 2359   10/24/21 1125  ceFAZolin (ANCEF) IVPB 2g/100 mL premix  Status:  Discontinued        2 g 200 mL/hr over 30 Minutes Intravenous 30 min pre-op 10/24/21 1125 10/24/21 1756   10/24/21 0600  ceFAZolin (ANCEF) IVPB 2g/100 mL premix  Status:  Discontinued        2 g 200 mL/hr over 30 Minutes Intravenous On call to O.R. 10/23/21 1548 10/24/21 2013   10/22/21 2200  piperacillin-tazobactam (ZOSYN) IVPB 3.375 g        3.375 g 12.5 mL/hr over 240 Minutes Intravenous Every 8 hours 10/22/21 2044        Subjective: Today, patient was seen and examined at bedside.  She was a little better with pain today.  Required IV narcotic yesterday evening.  Denies any nausea vomiting fever or chills.  Has had few bowel movements with bowel regimen  Objective: Vitals:   10/28/21 1528 10/28/21 1916 10/29/21 0336 10/29/21 0746  BP: (!) 142/102 (!) 133/93 127/74 135/67  Pulse: 80 78 79 76  Resp: '17 15 16 16  '$ Temp: 98.9 F (37.2 C) 98.7 F (37.1 C) 98.7 F (37.1 C) 98.4 F (36.9 C)  TempSrc:  Oral Oral Oral  SpO2: 98% 99% 97% 98%  Weight:  Height:        Intake/Output Summary (Last 24 hours) at 10/29/2021 1205 Last data filed at 10/29/2021 1149 Gross per 24 hour  Intake 1889.19 ml  Output 100 ml  Net 1789.19 ml    Filed Weights   10/22/21 1407  Weight: 61.2 kg    Physical examination:  Body mass index is 21.14 kg/m.   General:  Average built, not in obvious distress, alert awake and communicative HENT:   No scleral pallor or icterus noted. Oral mucosa is moist.  Chest:  Clear breath sounds.  Diminished breath sounds bilaterally. No crackles or wheezes.  CVS: S1 &S2 heard. No murmur.  Regular rate and rhythm. Abdomen: Soft, nonspecific tenderness noted, bowel sounds are  heard.  Percutaneous cholecystostomy tube in place. Extremities: No cyanosis, clubbing or edema.  Peripheral pulses are palpable.  Right upper extremity amputation. Psych: Alert, awake and oriented, normal mood CNS:  No cranial nerve deficits.  s.   Skin: Warm and dry.  No rashes noted.   Data Reviewed: I have personally reviewed following labs and imaging studies  CBC: Recent Labs  Lab 10/24/21 0426 10/25/21 0312 10/26/21 0701 10/27/21 0625 10/28/21 0400 10/29/21 0227  WBC 21.2* 26.1* 27.1* 18.3* 17.2* 18.2*  NEUTROABS 16.6*  --   --   --   --   --   HGB 9.6* 8.9* 9.1* 8.5* 8.2* 8.2*  HCT 31.1* 28.6* 30.1* 27.5* 26.8* 26.7*  MCV 74.4* 74.5* 74.9* 75.3* 74.9* 74.2*  PLT 764* 614* 709* 699* 717* 777*    Basic Metabolic Panel: Recent Labs  Lab 10/24/21 0426 10/25/21 0312 10/26/21 0701 10/26/21 2240 10/28/21 0400 10/29/21 0227  NA 133* 132* 134* 133* 134* 136  K 3.4* 3.3* 3.7 3.6 3.7 3.5  CL 101 101 103 102 101 103  CO2 '24 25 22 23 25 24  '$ GLUCOSE 125* 128* 103* 110* 120* 112*  BUN '8 6 8 7 6 '$ 5*  CREATININE 0.91 0.91 0.92 0.89 0.86 0.93  CALCIUM 8.7* 8.2* 8.5* 8.1* 8.0* 8.2*  MG 1.9 1.8 2.3 2.2 1.9 2.2  PHOS 3.1  --   --   --   --   --     GFR: Estimated Creatinine Clearance: 74.6 mL/min (by C-G formula based on SCr of 0.93 mg/dL).  Liver Function Tests: Recent Labs  Lab 10/22/21 1409 10/24/21 0426 10/29/21 0227  AST 14* 28 64*  ALT 9 17 108*  ALKPHOS 83 79 136*  BILITOT 0.5 0.6 0.2*  PROT 7.7 7.2 6.8  ALBUMIN 3.2* 2.9* 2.4*    CBG: No results for input(s): "GLUCAP" in the last 168 hours.   Recent Results (from the past 240 hour(s))  Culture, blood (Routine X 2) w Reflex to ID Panel     Status: None (Preliminary result)   Collection Time: 10/26/21  9:58 AM   Specimen: BLOOD LEFT HAND  Result Value Ref Range Status   Specimen Description BLOOD LEFT HAND  Final   Special Requests   Final    BOTTLES DRAWN AEROBIC AND ANAEROBIC Blood Culture adequate  volume   Culture   Final    NO GROWTH 3 DAYS Performed at [facility], [address], [place], [place] [address]    Report Status PENDING  Incomplete  Culture, blood (Routine X 2) w Reflex to ID Panel     Status: None (Preliminary result)   Collection Time: 10/26/21 10:08 AM   Specimen: BLOOD  Result Value Ref Range Status   Specimen  Description BLOOD BLOOD LEFT WRIST  Final   Special Requests   Final    BOTTLES DRAWN AEROBIC AND ANAEROBIC Blood Culture adequate volume   Culture   Final    NO GROWTH 3 DAYS Performed at [facility], [address] [place], [place] [address]    Report Status PENDING  Incomplete     Radiology Studies: No results found.  Scheduled Meds:  amLODipine  5 mg Oral Daily   atorvastatin  80 mg Oral Daily   Chlorhexidine Gluconate Cloth  6 each Topical Q0600   clopidogrel  75 mg Oral Q breakfast   docusate sodium  100 mg Oral BID   folic acid  1 mg Oral Daily   multivitamin with minerals  1 tablet Oral Daily   nicotine  21 mg Transdermal Daily   pantoprazole (PROTONIX) IV  40 mg Intravenous Q12H   polyethylene glycol  17 g Oral BID   sodium chloride flush  3 mL Intravenous Q12H   sodium chloride flush  5 mL Intracatheter Q8H   thiamine  100 mg Oral Daily   Or   thiamine  100 mg Intravenous Daily   Continuous Infusions:  sodium chloride     dextrose 5% lactated ringers 75 mL/hr at 10/28/21 1700   heparin 1,500 Units/hr (10/29/21 1149)   piperacillin-tazobactam (ZOSYN)  IV 12.5 mL/hr at 10/29/21 1149   promethazine (PHENERGAN) injection (IM or IVPB)       LOS: 7 days   Flora Lipps, MD [facility] 10/29/2021, 12:05 PM

## 2021-10-29 NOTE — Consult Note (Signed)
ANTICOAGULATION CONSULT NOTE - Follow Up Consult  Pharmacy Consult for Heparin gtt Indication:  aortic thrombus/MI  No Known Allergies  Patient Measurements: Height: '5\' 7"'$  (170.2 cm) Weight: 61.2 kg (135 lb) IBW/kg (Calculated) : 61.6 Heparin Dosing Weight: 61.2kg  Vital Signs: Temp: 98.7 F (37.1 C) (07/11 0336) Temp Source: Oral (07/11 0336) BP: 127/74 (07/11 0336) Pulse Rate: 79 (07/11 0336)  Labs: Recent Labs    10/26/21 2240 10/27/21 0625 10/28/21 0400 10/29/21 0227  HGB  --  8.5* 8.2* 8.2*  HCT  --  27.5* 26.8* 26.7*  PLT  --  699* 717* 777*  HEPARINUNFRC 0.42 0.57 0.48 0.50  CREATININE 0.89  --  0.86 0.93   Heparin Dosing Weight: 61.2kg  Estimated Creatinine Clearance: 74.6 mL/min (by C-G formula based on SCr of 0.93 mg/dL).  Medications:  PTA: Eliquis 2.'5mg'$  BID (RN confirmed, pt has been OFF for last 2 months PTA) Inpatient: +heparin gtt  Assessment: 46yo F w/ h/o renal & splenic infarcts (2016), Aortic thrombus (2016), DVT & arterial thrombus (12/2018), & extremity ischemia (2022) previously on Eliquis but stopped 43-month prior to arrival now presenting with sudden onset of abd' pain a/w N/V. Pharmacy consulted for mgmt of heparin gtt.  Date Time aPTT/HL Rate/Comment 7/5 0702 HL 0.22 SUBtherapeutic @ 1100 units/hr     7/5 1714 HL 0.45 Therapeutic x1 @ 1250 units/hr 7/6      0426    HL 0.35           Therapeutic X 3 @ 1250 units/hr 7/6 1246 - 2100  HEPARIN STOPPED FOR PROCEDURE 7/7      0312    HL 0.24           Subtherapeutic 7/7 1039 HL < 0.1 Subtherapeutic 7/7 1330 HL < 0.1 (repeat)Subtherapeutic 7/7      2306    HL =  0.28       Subtherapeutic  7/8      0701    HL =  0.64       Therapeutic x 1 7/8      1458    HL < 0.1      Heparin held for procedure 7/8      2240    HL = 0.42        Therapeutic X 1  7/9      0625    HL = 0.57        Therapeutic X 2 7/10    0400    HL = 0.48        Therapeutic X 3  7/11 0227 HL = 0.50 Therapeutic x 4  Baseline  Labs: aPTT - pending INR - pending HL - pending Hgb - 10.6 Plts - 784  Goal of Therapy:  Heparin level 0.3-0.7 units/ml aPTT 66-102 seconds Monitor platelets by anticoagulation protocol: Yes   Plan:  Will continue heparin infusion at 1500 units/hr Recheck HL daily with AM labs while therapeutic CBC daily while on heparin  NRenda Rolls PharmD, [facility]7/02/2022 6:15 AM

## 2021-10-29 NOTE — TOC Initial Note (Signed)
Transition of Care [facility]) - Initial/Assessment Note    Patient Details  Name: Suzanne [name] MRN: [id number] Date of Birth: 03/05/1976  [facility]) CM/SW Contact:    Beverly Sessions, RN Phone Number: 10/29/2021, 11:53 AM  Clinical Narrative:                    Notified that patient is unable to afford her $4 copay for eliquis.  Patient states that she will borrow money for friends for her copays.  Patient states that she will likely be homeless next week, and has been staying with a friend.    TOC initiated conversation about resources, Patient interrupts and said my [place] worker has already tried to give me all of those "bullshit" resources.  Patient declines and resources from [place] signing off. Please consult if indicated      Patient Goals and CMS Choice        Expected Discharge Plan and Services                                                Prior Living Arrangements/Services                       Activities of Daily Living Home Assistive Devices/Equipment: Dentures (specify type), Eyeglasses ADL Screening (condition at time of admission) Patient's cognitive ability adequate to safely complete daily activities?: Yes Is the patient deaf or have difficulty hearing?: No Does the patient have difficulty seeing, even when wearing glasses/contacts?: No Does the patient have difficulty concentrating, remembering, or making decisions?: No Patient able to express need for assistance with ADLs?: Yes Does the patient have difficulty dressing or bathing?: No Independently performs ADLs?: Yes (appropriate for developmental age) Does the patient have difficulty walking or climbing stairs?: No Weakness of Legs: None Weakness of Arms/Hands: None  Permission Sought/Granted                  Emotional Assessment              Admission diagnosis:  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] Mesenteric ischemia ([place]) [K55.9] Patient  Active Problem List   Diagnosis Date Noted   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   NSVT (nonsustained ventricular tachycardia) ([place]) 10/26/2021   Hypokalemia 10/24/2021   Abdominal pain 10/23/2021   PVD (peripheral vascular disease) ([place]) 10/23/2021   Hypercoagulable state ([place]) 10/23/2021   GERD (gastroesophageal reflux disease) 10/23/2021   Chronic pain 10/23/2021   COPD (chronic obstructive pulmonary disease) ([place]) 10/23/2021   Tobacco use 10/23/2021   Alcohol abuse 10/23/2021   Leg pain, right 10/23/2021   Essential hypertension 10/23/2021   Mesenteric ischemia ([place]) 10/22/2021   Ischemia of extremity 08/17/2020   Sprain of right wrist 01/16/2020   DVT (deep venous thrombosis) ([place]) 12/21/2018   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 [place] [address] Phone: [phone] Fax: [phone]     Social Determinants of Health (SDOH) Interventions    Readmission Risk Interventions     No data to display

## 2021-10-29 NOTE — Plan of Care (Signed)
Consult noted for GOC and pain. Patient states pain is effectively controlled. She would want full code/ full scope at present.  Full note to follow. Will need chaplain to see patient tomorrow.

## 2021-10-30 ENCOUNTER — Other Ambulatory Visit: Payer: Self-pay

## 2021-10-30 DIAGNOSIS — Z7189 Other specified counseling: Secondary | ICD-10-CM | POA: Diagnosis not present

## 2021-10-30 DIAGNOSIS — K559 Vascular disorder of intestine, unspecified: Secondary | ICD-10-CM | POA: Diagnosis not present

## 2021-10-30 LAB — MAGNESIUM: Magnesium: 2 mg/dL (ref 1.7–2.4)

## 2021-10-30 LAB — BASIC METABOLIC PANEL
Anion gap: 9 (ref 5–15)
BUN: 6 mg/dL (ref 6–20)
CO2: 23 mmol/L (ref 22–32)
Calcium: 8.2 mg/dL — ABNORMAL LOW (ref 8.9–10.3)
Chloride: 103 mmol/L (ref 98–111)
Creatinine, Ser: 0.82 mg/dL (ref 0.44–1.00)
GFR, Estimated: >60 mL/min (ref >60)
Glucose, Bld: 392 mg/dL — ABNORMAL HIGH (ref 70–99)
Potassium: 3.6 mmol/L (ref 3.5–5.1)
Sodium: 135 mmol/L (ref 135–145)

## 2021-10-30 LAB — CBC
HCT: 24.8 % — ABNORMAL LOW (ref 36.0–46.0)
Hemoglobin: 7.7 g/dL — ABNORMAL LOW (ref 12.0–15.0)
MCH: 22.9 pg — ABNORMAL LOW (ref 26.0–34.0)
MCHC: 31 g/dL (ref 30.0–36.0)
MCV: 73.8 fL — ABNORMAL LOW (ref 80.0–100.0)
Platelets: 823 10*3/uL — ABNORMAL HIGH (ref 150–400)
RBC: 3.36 MIL/uL — ABNORMAL LOW (ref 3.87–5.11)
RDW: 18.8 % — ABNORMAL HIGH (ref 11.5–15.5)
WBC: 17.2 10*3/uL — ABNORMAL HIGH (ref 4.0–10.5)
nRBC: 0 % (ref 0.0–0.2)

## 2021-10-30 MED ORDER — APIXABAN 5 MG PO TABS
5.0000 mg | ORAL_TABLET | Freq: Two times a day (BID) | ORAL | 2 refills | Status: AC
  Filled 2021-10-30: qty 60, 30d supply, fill #0

## 2021-10-30 MED ORDER — CLOPIDOGREL BISULFATE 75 MG PO TABS: 75.0000 mg | ORAL_TABLET | Freq: Every day | ORAL | 2 refills | Status: AC

## 2021-10-30 MED ORDER — ATORVASTATIN CALCIUM 80 MG PO TABS: 80.0000 mg | ORAL_TABLET | Freq: Every day | ORAL | 2 refills | Status: AC

## 2021-10-30 MED ORDER — OXYCODONE HCL 10 MG PO TABS
5.0000 mg | ORAL_TABLET | ORAL | 0 refills | Status: AC | PRN
Start: 2021-10-30 — End: 2021-11-06

## 2021-10-30 MED ORDER — AMOXICILLIN-POT CLAVULANATE 875-125 MG PO TABS
1.0000 | ORAL_TABLET | Freq: Two times a day (BID) | ORAL | 0 refills | Status: AC
Start: 2021-10-30 — End: 2021-11-13

## 2021-10-30 MED ORDER — ACETAMINOPHEN 325 MG PO TABS: 650.0000 mg | ORAL_TABLET | Freq: Four times a day (QID) | ORAL | 2 refills | Status: AC | PRN

## 2021-10-30 MED ORDER — ADULT MULTIVITAMIN W/MINERALS CH: 1.0000 | ORAL_TABLET | Freq: Every day | ORAL | Status: AC

## 2021-10-30 MED ORDER — AMLODIPINE BESYLATE 5 MG PO TABS
5.0000 mg | ORAL_TABLET | Freq: Every day | ORAL | 2 refills | Status: AC
Start: 2021-10-31 — End: ?

## 2021-10-30 NOTE — Progress Notes (Signed)
  Chaplain On-Call received verbal request from NP [employer] to visit the patient.  Chaplain attempted two visits this morning. However, the patient was sleeping soundly on both occasions.  Chaplain will refer to the Afternoon On-Call Chaplain for follow up.  Chaplain Pollyann Samples M.Div., [facility]

## 2021-10-30 NOTE — Progress Notes (Signed)
   10/30/21 1500  Clinical Encounter Type  Visited With Patient  Visit Type Initial  Referral From Palliative care team  Consult/Referral To Chaplain   Chaplain responded to referral to visit with patient. Chaplain found patient to be responsive and pleasant to talk with. Patient mentioned that she did not want to talk about what "nurse" may have told me. I responded, "Let's talk about something else."

## 2021-10-30 NOTE — Inpatient Diabetes Management (Signed)
Inpatient Diabetes Program Recommendations  AACE/ADA: New Consensus Statement on Inpatient Glycemic Control   Target Ranges:  Prepandial:   less than 140 mg/dL      Peak postprandial:   less than 180 mg/dL (1-2 hours)      Critically ill patients:  140 - 180 mg/dL    Latest Reference Range & Units 10/28/21 04:00 10/29/21 02:27 10/30/21 05:56  Glucose 70 - 99 mg/dL 120 (H) 112 (H) 392 (H)   Review of Glycemic Control  Diabetes history: No Outpatient Diabetes medications: NA Current orders for Inpatient glycemic control: None  Inpatient Diabetes Program Recommendations:    CBG monitoring: Lab glucose 392 mg/dl today but otherwise has been in 100's mg/dl. May want to consider ordering CBGs AC&HS and adding Novolog correction scale if needed.  Thanks, Barnie Alderman, RN, MSN, [place] Diabetes Coordinator Inpatient Diabetes Program [phone] (Team Pager from 8am to [place])

## 2021-10-30 NOTE — Discharge Summary (Signed)
Physician Discharge Summary   Patient: Suzanne [name] MRN: [id number] DOB: Jan 12, 1976  Admit date:     10/22/2021  Discharge date: 10/30/2021  Discharge Physician: Ezekiel Slocumb   PCP: System, Provider Not In   Recommendations at discharge:   Follow-up with interventional radiology in 10 to 14 days for percutaneous biliary drain Follow-up with vascular surgery Continue ongoing support for smoking cessation  Discharge Diagnoses: Principal Problem:   Mesenteric ischemia ([place]) Active Problems:   Abdominal pain   PVD (peripheral vascular disease) ([place])   Hypercoagulable state ([place])   Leg pain, right   GERD (gastroesophageal reflux disease)   Alcohol abuse   Chronic pain   COPD (chronic obstructive pulmonary disease) ([place])   Essential hypertension   Tobacco use   Hypokalemia   NSVT (nonsustained ventricular tachycardia) ([facility])   Hepatic infarction   Acalculous cholecystitis  Resolved Problems:   * No resolved hospital problems. *  Hospital Course: Suzanne [name] is a 46 y.o. female with PMH of HTN, PAD, active smoker, hypercoagulability-splenic infarct, aortic thrombus, right upper extremity acute limb ischemia failed multiple revascularization attempts ultimately requiring right transfer radial amputation, COPD.  7/4 presented to ER with abdominal pain.  Associated with melena.  Vascular surgery was consulted for mesenteric ischemia concerns. 7/6 underwent's abdominal aortogram, percutaneous angioplasty of celiac artery.  Has patent SMA after multiple difficult times, celiac artery was engaged.  There is also significant disease in gastroduodenal artery.  As well as hepatic and splenic arteries. For vascular surgery rest of the disease process is not reconstructable using interventional technique. Patient is not a good candidate for surgical reconstruction due to her extensive distal disease. Postoperatively patient had some abdominal pain and a CT abdomen pelvis was  performed which showed hepatic artery infarction as well as a calculus cholecystitis.  7/7 General surgery consulted.  Recommend conservative measures due to extensive risk for clot progression going to cholecystectomy.  IR was consulted for percutaneous cholecystostomy.  Patient was started on IV Zosyn  7/8 IR placed percutaneous cholecystostomy drain.  IR also placed left PICC line. Diet advanced to clear liquid diet.  7/9 diet advanced to full liquid diet.  Leukocytosis improving.  Still on heparin.  Uncontrolled pain on IV analgesics.  7/11 transitioned to Eliquis, pain control switched to oral  7/12 pain controlled, pt tolerating diet and requests d/c home. She is clinically improved and medically stable for d/c home.   Assessment and Plan: *Abdominal pain/mesenteric ischemia/hepatic infarction [facility]) Patient did have a CT scan of the abdomen and pelvis with severe atherosclerotic disease.   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  Patient underwent mesenteric angiogram and percutaneous transluminal angioplasty of the celiac artery on 10/24/2021.  Patient is not a good candidate for reconstruction with very high risk for poor outcome.  Despite angioplasty, patient continued to have abdominal pain and repeat CT scan showed hepatic infarct and acalculus cholecystitis.  Patient is status post percutaneous cholecystostomy on 10/26/2021 and has been followed by interventional radiology at this time.  Plan is to continue to for 6 to 8 weeks.  Will need to let IR know prior to disposition and will have to follow-up in the IR clinic 10 to 14 days after discharge for repeat imaging possible drain injection.  Pain has overall improved.  Patient required morphine drip but has since transitioned to oral pain medications with adequate control.   Acalculous cholecystitis. Not present on admission. Shown on  Repeat CT scan abdomen.  Patient received IV  Zosyn during hospitalization.  General surgery was consulted and patient is not a candidate for surgical intervention, patient underwent IR guided percutaneous placement of cholecystostomy tube.  Will need to continue cholecystostomy tube for 6 to 8 weeks and follow-up with IR clinic.Marland Kitchen   Patient still has leukocytosis spoke with general surgery Dr. Peyton Najjar.  Likely reactive but recommend Augmentin to complete the course after discharge.   Leukocytosis.  Treated with IV Zosyn.  Leukocytosis improved.  Discharging on Augmentin.   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  Treated with heparin drip and transitioned to Eliquis on 7/11.  TOC consulted and confirmed $4 co-pay for Eliquis.   PVD (peripheral vascular disease, status post amputation of the right upper extremity. ABI shows resting ABI in the mild range of occlusive disease on the left and moderate on the right   Daily alcohol abuse Was initially on Ativan protocol. Continue thiamine folic acid.  No signs of active withdrawal at this time.   GERD (gastroesophageal reflux disease) Continue Protonix   Acute on chronic pain, intractable abdominal pain Currently on morphine IV and oxycodone.  Adjusting pain medication regimen at this time   Essential hypertension Continue amlodipine low-dose..  Blood pressure seems to be stable at this time.   COPD (chronic obstructive pulmonary disease) (HCC)   Continue albuterol and Pulmicort.  Appears compensated.   Hyponatremia.  Improved.  Latest sodium of 136.   Tobacco use Continue nicotine patch. Smoking cessation strongly advised given her severe vascular disease         Consultants: Interventional radiology, general surgery, GI, vascular  surgery Procedures performed:  Mesenteric angiogram with selective celiac artery angioplasty on 10/24/2021 IR guided percutaneous cholecystostomy on 10/26/2021 PICC line placement 10/26/2021  Disposition: Home Diet recommendation:  Cardiac diet DISCHARGE MEDICATION: Allergies as of 10/30/2021   No Known Allergies      Medication List     STOP taking these medications    aspirin EC 81 MG tablet       TAKE these medications    acetaminophen 325 MG tablet Commonly known as: Tylenol Take 2 tablets (650 mg total) by mouth every 6 (six) hours as needed for mild pain, fever or headache.   albuterol 108 (90 Base) MCG/ACT inhaler Commonly known as: VENTOLIN HFA Inhale 2 puffs into the lungs every 6 (six) hours as needed for wheezing or shortness of breath.   amLODipine 5 MG tablet Commonly known as: NORVASC Take 1 tablet (5 mg total) by mouth daily. Start taking on: October 31, 2021   amoxicillin-clavulanate 875-125 MG tablet Commonly known as: AUGMENTIN Take 1 tablet by mouth 2 (two) times daily for 14 days.   apixaban 5 MG Tabs tablet Commonly known as: Eliquis Take 1 tablet (5 mg total) by mouth 2 (two) times daily. What changed:  medication strength See the new instructions.   atorvastatin 80 MG tablet Commonly known as: LIPITOR Take 1 tablet (80 mg total) by mouth daily. Start taking on: October 31, 2021 What changed:  medication strength how much to take   clopidogrel 75 MG tablet Commonly known as: PLAVIX Take 1 tablet (75 mg total) by mouth daily with breakfast. Start taking on: October 31, 2021   multivitamin  with minerals Tabs tablet Take 1 tablet by mouth daily. Start taking on: October 31, 2021   Oxycodone HCl 10 MG Tabs Take 0.5-1 tablets (5-10 mg total) by mouth every 4 (four) hours as needed for up to 7 days for moderate pain or severe pain.               Discharge Care Instructions  (From admission, onward)           Start     Ordered    10/30/21 0000  Discharge wound care:       Comments: See Radiology's instructions provided in this packet for drain care.   10/30/21 1220            Follow-up Information     Schnier, Dolores Lory, MD. Call.   Specialties: Vascular Surgery, Cardiology, Radiology, Vascular Surgery Why: Call to schedule follow up appointment. left a message for them to call the pt with an appointment Contact information: [place] [address] [phone]         [place]. Schedule an appointment as soon as possible for a visit.   Why: Needs follow up with Interventional Radiology in 10-14 days Contact information: [place]        Sandi Mariscal, MD Follow up in 2 month(s).   Specialties: Interventional Radiology, Radiology Why: Our schedulers will call patient Contact information: [place] [address] [phone]                Discharge Exam: Filed Weights   10/22/21 1407  Weight: 61.2 kg   General exam: awake, alert, no acute distress HEENT: moist mucus membranes, hearing grossly normal  Respiratory system: on room air, normal respiratory effort. Cardiovascular system: normal S1/S2, RRR, no pedal edema.   Gastrointestinal system: soft, NT, ND, perc biliary drain in place with dark green/brown fluid in bag and tubing. Central nervous system: A&O x4. no gross focal neurologic deficits, normal speech Extremities: moves all, no edema, normal tone Skin: dry, intact, normal temperature Psychiatry: normal mood, congruent affect, judgement and insight appear normal   Condition at discharge: stable  The results of significant diagnostics from this hospitalization (including imaging, microbiology, ancillary and laboratory) are listed below for reference.   Imaging Studies: IR PICC PLACEMENT LEFT >5 YRS INC IMG GUIDE  Result Date: 10/26/2021 INDICATION: Poor venous access. Please  perform PICC line placement two provided for durable intravenous access for medication administration and blood draws. EXAM: ULTRASOUND AND FLUOROSCOPIC GUIDED PICC LINE INSERTION MEDICATIONS: None. CONTRAST:  None FLUOROSCOPY TIME:  6 seconds (2 mGy) COMPLICATIONS: None immediate. TECHNIQUE: The procedure, risks, benefits, and alternatives were explained to the patient and informed written consent was obtained. A timeout was performed prior to the initiation of the procedure. The left upper extremity was prepped with chlorhexidine in a sterile fashion, and a sterile drape was applied covering the operative field. Maximum barrier sterile technique with sterile gowns and gloves were used for the procedure. A timeout was performed prior to the initiation of the procedure. Local anesthesia was provided with 1% lidocaine. Under direct ultrasound guidance, the brachial vein was accessed with a micropuncture kit after the overlying soft tissues were anesthetized with 1% lidocaine. Real-time ultrasound guidance was utilized for vascular access including the acquisition of a permanent ultrasound image documenting patency of the accessed vessel. A guidewire was advanced to the level of the superior caval-atrial junction for measurement purposes  and the PICC line was cut to length. A peel-away sheath was placed and a 6 [place], triple lumen was inserted to level of the superior caval-atrial junction. A post procedure spot fluoroscopic was obtained. The catheter easily aspirated and flushed and was secured in place with stat lock device. A dressing was applied. The patient tolerated the procedure well without immediate post procedural complication. FINDINGS: After catheter placement, the tip lies within the superior cavoatrial junction. The catheter aspirates and flushes normally and is ready for immediate use. IMPRESSION: Successful ultrasound and fluoroscopic guided placement of a left brachial vein approach, 6 French, triple  lumen PICC with tip at the superior caval-atrial junction. The PICC line is ready for immediate use. Electronically Signed   By: Sandi Mariscal M.D.   On: 10/26/2021 14:31   IR Perc Cholecystostomy  Result Date: 10/26/2021 INDICATION: Acute cholecystitis. Poor surgical candidate. Please perform image guided cholecystostomy tube placement for infection source control purposes. EXAM: ULTRASOUND AND FLUOROSCOPIC-GUIDED CHOLECYSTOSTOMY TUBE PLACEMENT COMPARISON:  CT abdomen pelvis-10/25/2021 MEDICATIONS: The patient is currently admitted to the hospital and on intravenous antibiotics. Antibiotics were administered within an appropriate time frame prior to skin puncture. ANESTHESIA/SEDATION: Moderate (conscious) sedation was employed during this procedure as administered by the Interventional Radiology RN. A total of Versed 1 mg and Fentanyl 100 mcg was administered intravenously. Moderate Sedation Time: 50 minutes. The patient's level of consciousness and vital signs were monitored continuously by radiology nursing throughout the procedure under my direct supervision. CONTRAST:  8 mL OMNIPAQUE IOHEXOL 350 MG/ML SOLN - administered into the gallbladder lumen. FLUOROSCOPY TIME:  30 seconds (9.5 mGy) COMPLICATIONS: None immediate. PROCEDURE: Informed written consent was obtained from the patient after a discussion of the risks, benefits and alternatives to treatment. Questions regarding the procedure were encouraged and answered. A timeout was performed prior to the initiation of the procedure. The right upper abdominal quadrant was prepped and draped in the usual sterile fashion, and a sterile drape was applied covering the operative field. Maximum barrier sterile technique with sterile gowns and gloves were used for the procedure. A timeout was performed prior to the initiation of the procedure. Local anesthesia was provided with 1% lidocaine with epinephrine. Ultrasound scanning of the right upper quadrant demonstrates  a markedly dilated gallbladder. Of note, the patient reported pain with ultrasound imaging over the gallbladder. Note, sonographic evaluation was performed of the right lobe of the liver by the dictating interventional radiologist however failed to reproduce the new ill-defined lesion involving the anterior aspect of the dome of the right lobe of the liver questioned on preceding noncontrast CT. Utilizing a transhepatic approach, a 22 gauge needle was advanced into the gallbladder under direct ultrasound guidance. An ultrasound image was saved for documentation purposes. Appropriate intraluminal puncture was confirmed with the efflux of bile and advancement of an 0.018 wire into the gallbladder lumen. The needle was exchanged for an Bassett set. A small amount of contrast was injected to confirm appropriate intraluminal positioning. Over a Benson wire, a 30.2-French Cook cholecystomy tube was advanced into the gallbladder fossa, coiled and locked. Bile was aspirated and a small amount of contrast was injected as several post procedural spot radiographic images were obtained in various obliquities. The catheter was secured to the skin with suture, connected to a drainage bag and a dressing was placed. The patient tolerated the procedure well without immediate post procedural complication. IMPRESSION: Successful ultrasound and fluoroscopic guided placement of a 10.2 French cholecystostomy tube. Note, sonographic evaluation of  the right lobe of the liver failed to confidently identified ill-defined hypoattenuating lesion involving the dome of the anterior segment of the right lobe of the liver questioned on preceding noncontrast abdominal CT. Electronically Signed   By: Sandi Mariscal M.D.   On: 10/26/2021 14:27   [place] EKG SITE RITE  Result Date: 10/26/2021 If Site Rite image not attached, placement could not be confirmed due to current cardiac rhythm.  CT CHEST ABDOMEN PELVIS W CONTRAST  Result Date:  10/25/2021 CLINICAL DATA:  Vasculitis suspected, mesenteric ischemia EXAM: CT CHEST, ABDOMEN, AND PELVIS WITH CONTRAST TECHNIQUE: Multidetector CT imaging of the chest, abdomen and pelvis was performed following the standard protocol during bolus administration of intravenous contrast. RADIATION DOSE REDUCTION: This exam was performed according to the departmental dose-optimization program which includes automated exposure control, adjustment of the mA and/or kV according to patient size and/or use of iterative reconstruction technique. CONTRAST:  184m OMNIPAQUE IOHEXOL 300 MG/ML  SOLN COMPARISON:  CT angiogram chest abdomen pelvis, 10/22/2021 FINDINGS: CT CHEST FINDINGS Cardiovascular: Scattered aortic atherosclerosis. Redemonstrated extensive, irregular mural thrombus throughout the descending thoracic aorta, contiguous with the upper abdominal aorta. Normal heart size. Left coronary artery calcifications. No pericardial effusion. Mediastinum/Nodes: No enlarged mediastinal, hilar, or axillary lymph nodes. Small hiatal hernia. Thyroid gland, trachea, and esophagus demonstrate no significant findings. Lungs/Pleura: Mild, predominantly paraseptal emphysema. Diffuse bilateral bronchial wall thickening. No pleural effusion or pneumothorax. Musculoskeletal: No chest wall mass or suspicious osseous lesions identified. CT ABDOMEN PELVIS FINDINGS Hepatobiliary: New hypodense subcapsular lesion of the anterior right lobe of the liver, hepatic segment VIII, measuring 2.8 x 2.0 cm (series 2, image 52). Distended gallbladder with pericholecystic fat stranding (series 2, image 73). Gallbladder wall thickening, or biliary dilatation. Pancreas: Unremarkable. No pancreatic ductal dilatation or surrounding inflammatory changes. Spleen: Chronically infarcted, atrophic spleen. Adrenals/Urinary Tract: Adrenal glands are unremarkable. Lobulated bilateral renal cortex, consistent with prior infarction. Kidneys are otherwise normal,  without renal calculi, solid lesion, or hydronephrosis. Bladder is unremarkable. Stomach/Bowel: Stomach is within normal limits. Appendix appears normal. No evidence of bowel wall thickening, distention, or inflammatory changes. Vascular/Lymphatic: Aortic atherosclerosis. Large burden of irregular thrombus throughout the abdominal aorta. 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 The superior mesenteric artery remains patent. No enlarged abdominal or pelvic lymph nodes. Reproductive: No mass or other abnormality. Other: No abdominal wall hernia or abnormality. No ascites. Musculoskeletal: No acute osseous findings. IMPRESSION: 1. Redemonstrated extensive, irregular mural thrombus throughout the thoracic and abdominal aorta on this non tailored, non angiographic examination. 2. 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 The superior mesenteric artery remains patent. 3. New hypodense subcapsular lesion of the anterior right lobe of the liver, hepatic segment VIII, measuring 2.8 x 2.0 cm. This is concerning for a hepatic infarction given interval development, despite this being a very uncommon entity. 4. Distended gallbladder with pericholecystic fat stranding, consistent with acute cholecystitis, as above possibly secondary to ischemia. 5. Emphysema and diffuse bilateral bronchial wall thickening. 6. Aortic atherosclerosis, advanced for patient age. These results will be called to the ordering clinician or representative by the Radiologist Assistant, and communication documented in the PACS or [employer] Aortic Atherosclerosis (ICD10-I70.0) and Emphysema (ICD10-J43.9). Electronically Signed   By: ADelanna AhmadiM.D.   On: 10/25/2021 19:39   DG Abd 1 View  Result Date: 10/25/2021 CLINICAL DATA:  Abdominal pain and tenderness for several weeks EXAM: ABDOMEN - 1 VIEW  COMPARISON:  10/22/2021 FINDINGS:  Supine frontal view of the abdomen and pelvis excludes the left hemidiaphragm by collimation. Bowel gas pattern is unremarkable without obstruction or ileus. There are no masses or abnormal calcifications. No acute bony abnormalities. 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 IMPRESSION: 1. Unremarkable bowel gas pattern. Electronically Signed   By: Randa Ngo M.D.   On: 10/25/2021 15:41   PERIPHERAL VASCULAR CATHETERIZATION  Result Date: 10/24/2021 See surgical note for result.  US ARTERIAL ABI (SCREENING LOWER EXTREMITY)  Result Date: 10/24/2021 CLINICAL DATA:  46 year old female with history of mesenteric ischemia, prior thromboembolic disease EXAM: NONINVASIVE PHYSIOLOGIC VASCULAR STUDY OF BILATERAL LOWER EXTREMITIES TECHNIQUE: Evaluation of both lower extremities was performed at rest, including calculation of ankle-brachial indices, multiple segmental pressure evaluation, segmental Doppler and segmental pulse volume recording. COMPARISON:  None Available. FINDINGS: Right ABI:  0.43 Left ABI:  0.78 Right Lower Extremity: Segmental Doppler at the right ankle demonstrates flat line of the posterior tibial artery and monophasic dorsalis pedis. Left Lower Extremity: Segmental Doppler at the left ankle demonstrates multiphasic waveforms. IMPRESSION: Right: Resting ABI in the moderate range of arterial occlusive disease and would be expected to decrease after exercise exam. Segmental exam at the ankle demonstrates more proximal occlusive disease. Left: Resting ABI in the mild range of arterial occlusive disease. This might decrease after exercise exam. Segmental exam at the ankle demonstrates waveforms relatively maintained. Signed, Dulcy Fanny. Nadene Rubins, RPVI [facility] Radiology Electronically Signed   By: Corrie Mckusick D.O.   On: 10/24/2021 08:26   CT Angio Chest/Abd/Pel for Dissection W and/or Wo Contrast  Addendum Date: 10/22/2021    ADDENDUM REPORT: 10/22/2021 18:07 ADDENDUM: These results were called by telephone at the time of interpretation on 10/22/2021 at 6:07 pm to provider Merlyn Lot , who verbally acknowledged these results. Electronically Signed   By: Iven Finn M.D.   On: 10/22/2021 18:07   Result Date: 10/22/2021 CLINICAL DATA:  Aortic atherosclerosis. Pt via POV from home. Pt c/o epigastric pain that radiates to her back, SOB, headaches, and emesis that has been going on the past month, states today it is the worse it has ever been. States that she has been without her Eliquis EXAM: CT ANGIOGRAPHY CHEST, ABDOMEN AND PELVIS TECHNIQUE: Non-contrast CT of the chest was initially obtained. Multidetector CT imaging through the chest, abdomen and pelvis was performed using the standard protocol during bolus administration of intravenous contrast. Multiplanar reconstructed images and MIPs were obtained and reviewed to evaluate the vascular anatomy. RADIATION DOSE REDUCTION: This exam was performed according to the departmental dose-optimization program which includes automated exposure control, adjustment of the mA and/or kV according to patient size and/or use of iterative reconstruction technique. CONTRAST:  7m OMNIPAQUE IOHEXOL 350 MG/ML SOLN COMPARISON:  CT angiography chest, abdomen, pelvis 03/10/2020 FINDINGS: CTA CHEST FINDINGS Cardiovascular: Preferential opacification of the thoracic aorta. No evidence of thoracic aortic aneurysm or dissection. Severe atherosclerotic plaque with mid to distal descending thoracic aorta noncalcified atheromatous plaque. Thin central filling defect within the origin of the left subclavian artery likely chronic web or thrombus. No coronary artery calcifications. Normal heart size. No significant pericardial effusion. The main pulmonary artery is normal in caliber. No central or proximal segmental pulmonary embolus. Mediastinum/Nodes: No enlarged mediastinal, hilar, or axillary lymph  nodes. Thyroid gland, trachea, and esophagus demonstrate no significant findings. Lungs/Pleura: Mild paraseptal emphysematous changes peer no focal consolidation. No pulmonary nodule. No pulmonary mass. No pleural effusion.  No pneumothorax. Musculoskeletal: No chest wall abnormality. No suspicious lytic or blastic osseous lesions. No acute displaced fracture. Review of the MIP images confirms the above findings. CTA ABDOMEN AND PELVIS FINDINGS VASCULAR Aorta: Severe atherosclerotic plaque with marked noncalcified atheromatous plaque leading to diffuse stenosis of the abdominal aorta with the infrarenal abdominal aorta down to a caliber of 0.7 x 1.2 cm. Normal caliber aorta without aneurysm, dissection, vasculitis. 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 SMA: Thin chronic thrombus string like thrombus within the origin of the superior mesenteric artery (6:140). Patent without evidence of aneurysm, dissection, vasculitis or significant stenosis. Renals: Both renal arteries are patent without evidence of aneurysm, dissection, vasculitis, fibromuscular dysplasia or significant stenosis. 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 Inflow: Patent without evidence of aneurysm, dissection, vasculitis or significant stenosis. Veins: No obvious venous abnormality within the limitations of this arterial phase study. Review of the MIP images confirms the above findings. NON-VASCULAR Hepatobiliary: Query trace nodular hepatic contour. Liver measures at the upper limits of normal. No focal liver abnormality. No gallstones, gallbladder wall thickening, or pericholecystic fluid. No biliary dilatation. Pancreas: No focal lesion. Normal pancreatic contour. No surrounding inflammatory changes. No main pancreatic ductal dilatation. Spleen: Normal in size without focal abnormality.  Adrenals/Urinary Tract: No adrenal nodule bilaterally. Bilateral kidneys enhance symmetrically. Bilateral renal cortical scarring. Subcentimeter hypodensities too small to characterize. No definite wedge-shaped peripheral hypodensities suggest infarction. No hydronephrosis. No hydroureter. The urinary bladder is unremarkable. Stomach/Bowel: Stomach is within normal limits. No evidence of bowel wall thickening or dilatation. No pneumatosis. Appendix appears normal. Lymphatic: No lymphadenopathy. Reproductive: Uterus and bilateral adnexa are unremarkable. Other: No intraperitoneal free fluid. No intraperitoneal free gas. No organized fluid collection. Musculoskeletal: No abdominal wall hernia or abnormality. No suspicious lytic or blastic osseous lesions. No acute displaced fracture. Please moderate degenerative changes of the hips. Review of the MIP images confirms the above findings. IMPRESSION: 1. 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 2. Severe atherosclerotic plaque with marked noncalcified atheromatous plaque of the thoracic and abdominal aorta leading to diffuse stenosis with the infrarenal abdominal aorta down to a caliber of 0.7 x 1.2 cm. 3. 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 4. Thin chronic thrombus string like thrombus within the origin of the superior mesenteric artery and left subclavian artery. 5. No central or proximal segmental pulmonary embolus. 6. No intrathoracic, intra-abdominal, intrapelvic abnormality findings to suggest infarction/ischemia. 7. Query cirrhotic morphology of the liver. 8. Aortic Atherosclerosis (ICD10-I70.0) and Emphysema (ICD10-J43.9). Electronically Signed: By: Iven Finn M.D. On: 10/22/2021 18:04   DG Chest 2 View  Result Date: 10/22/2021 CLINICAL DATA:  Acute shortness of breath. EXAM: CHEST - 2 VIEW COMPARISON:   11/20/2016 radiograph FINDINGS: The cardiomediastinal silhouette is unremarkable. There is no evidence of focal airspace disease, pulmonary edema, suspicious pulmonary nodule/mass, pleural effusion, or pneumothorax. No acute bony abnormalities are identified. IMPRESSION: No active cardiopulmonary disease. Electronically Signed   By: Margarette Canada M.D.   On: 10/22/2021 14:45    Microbiology: Results for orders placed or performed during the hospital encounter of 10/22/21  Culture, blood (Routine X 2) w Reflex to ID Panel     Status: None (Preliminary result)   Collection Time: 10/26/21  9:58 AM   Specimen: BLOOD LEFT HAND  Result Value  Ref Range Status   Specimen Description BLOOD LEFT HAND  Final   Special Requests   Final    BOTTLES DRAWN AEROBIC AND ANAEROBIC Blood Culture adequate volume   Culture   Final    NO GROWTH 4 DAYS Performed at [facility], [address] [place], [place] [address]    Report Status PENDING  Incomplete  Culture, blood (Routine X 2) w Reflex to ID Panel     Status: None (Preliminary result)   Collection Time: 10/26/21 10:08 AM   Specimen: BLOOD  Result Value Ref Range Status   Specimen Description BLOOD BLOOD LEFT WRIST  Final   Special Requests   Final    BOTTLES DRAWN AEROBIC AND ANAEROBIC Blood Culture adequate volume   Culture   Final    NO GROWTH 4 DAYS Performed at [facility], [address] [place], [place] [address]    Report Status PENDING  Incomplete    Labs: CBC: Recent Labs  Lab 10/24/21 0426 10/25/21 0312 10/26/21 0701 10/27/21 0625 10/28/21 0400 10/29/21 0227 10/30/21 0556  WBC 21.2*   < > 27.1* 18.3* 17.2* 18.2* 17.2*  NEUTROABS 16.6*  --   --   --   --   --   --   HGB 9.6*   < > 9.1* 8.5* 8.2* 8.2* 7.7*  HCT 31.1*   < > 30.1* 27.5* 26.8* 26.7* 24.8*  MCV 74.4*   < > 74.9* 75.3* 74.9* 74.2* 73.8*  PLT 764*   < > 709* 699* 717* 777* 823*   < > = values in this interval not displayed.   Basic Metabolic  Panel: Recent Labs  Lab 10/24/21 0426 10/25/21 0312 10/26/21 0701 10/26/21 2240 10/28/21 0400 10/29/21 0227 10/30/21 0556  NA 133*   < > 134* 133* 134* 136 135  K 3.4*   < > 3.7 3.6 3.7 3.5 3.6  CL 101   < > 103 102 101 103 103  CO2 24   < > _0 GLUCOSE 125*   < > 103* 110* 120* 112* 392*  BUN 8   < > _1 5* 6  CREATININE 0.91   < > 0.92 0.89 0.86 0.93 0.82  CALCIUM 8.7*   < > 8.5* 8.1* 8.0* 8.2* 8.2*  MG 1.9   < > 2.3 2.2 1.9 2.2 2.0  PHOS 3.1  --   --   --   --   --   --    < > = values in this interval not displayed.   Liver Function Tests: Recent Labs  Lab 10/24/21 0426 10/29/21 0227  AST 28 64*  ALT 17 108*  ALKPHOS 79 136*  BILITOT 0.6 0.2*  PROT 7.2 6.8  ALBUMIN 2.9* 2.4*   CBG: No results for input(s): "GLUCAP" in the last 168 hours.  Discharge time spent: greater than 30 minutes.  Signed: Ezekiel Slocumb, DO [facility] 10/30/2021

## 2021-10-30 NOTE — Progress Notes (Addendum)
Daily Progress Note   Patient Name: Suzanne [name]       Date: 10/30/2021 DOB: 1975-11-09  Age: 46 y.o. MRN#: [id number] Attending Physician: Ezekiel Slocumb, DO Primary Care Physician: System, Provider Not In Admit Date: 10/22/2021  Reason for Consultation/Follow-up: Establishing goals of care  Subjective: Spoke with chaplain as patient and I discussed. Also talked to [facility] to assess options to help patient. Returned to bedside today. Patient is sitting with hand over her face, and does not uncover her face. She is very upset and states she is going home today no matter what. Dicussed that I was returning as per our conversation yesterday. She states "just because I talked to you,  you don't get to dictate where I go when I leave." Discussed that I am not part of her discharge planning and that I was only trying to help as per our discussion yesterday regarding her living situation and dynamics. She would not speak further. Advised I would step out and to let us know if there is anything we can do for her.   Length of Stay: 8  Current Medications: Scheduled Meds:   amLODipine  5 mg Oral Daily   apixaban  5 mg Oral BID   atorvastatin  80 mg Oral Daily   Chlorhexidine Gluconate Cloth  6 each Topical Q0600   clopidogrel  75 mg Oral Q breakfast   docusate sodium  100 mg Oral BID   folic acid  1 mg Oral Daily   multivitamin with minerals  1 tablet Oral Daily   nicotine  21 mg Transdermal Daily   pantoprazole (PROTONIX) IV  40 mg Intravenous Q12H   polyethylene glycol  17 g Oral BID   sodium chloride flush  3 mL Intravenous Q12H   sodium chloride flush  5 mL Intracatheter Q8H   thiamine  100 mg Oral Daily   Or   thiamine  100 mg Intravenous Daily    Continuous Infusions:  sodium  chloride     dextrose 5% lactated ringers Stopped (10/29/21 1712)   piperacillin-tazobactam (ZOSYN)  IV 3.375 g (10/30/21 0848)   promethazine (PHENERGAN) injection (IM or IVPB)      PRN Meds: sodium chloride, acetaminophen **OR** acetaminophen, oxyCODONE **AND** acetaminophen, albuterol, hydrALAZINE, iohexol, lidocaine (PF), morphine injection, promethazine (PHENERGAN) injection (IM or IVPB),  sodium chloride flush  Physical Exam Pulmonary:     Effort: Pulmonary effort is normal.  Neurological:     Mental Status: She is alert.             Vital Signs: BP (!) 148/87 (BP Location: Left Leg)   Pulse 73   Temp 98.4 F (36.9 C)   Resp 18   Ht '5\' 7"'$  (1.702 m)   Wt 61.2 kg   LMP 10/08/2021   SpO2 99%   BMI 21.14 kg/m  SpO2: SpO2: 99 % O2 Device: O2 Device: Room Air O2 Flow Rate: O2 Flow Rate (L/min): 2 L/min  Intake/output summary:  Intake/Output Summary (Last 24 hours) at 10/30/2021 1120 Last data filed at 10/30/2021 1022 Gross per 24 hour  Intake 579.09 ml  Output 350 ml  Net 229.09 ml   LBM: Last BM Date : 10/29/21 Baseline Weight: Weight: 61.2 kg Most recent weight: Weight: 61.2 kg    Patient Active Problem List   Diagnosis Date Noted   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   NSVT (nonsustained ventricular tachycardia) (HCC) 10/26/2021   Hypokalemia 10/24/2021   Abdominal pain 10/23/2021   PVD (peripheral vascular disease) ([place]) 10/23/2021   Hypercoagulable state ([place]) 10/23/2021   GERD (gastroesophageal reflux disease) 10/23/2021   Chronic pain 10/23/2021   COPD (chronic obstructive pulmonary disease) ([place]) 10/23/2021   Tobacco use 10/23/2021   Alcohol abuse 10/23/2021   Leg pain, right 10/23/2021   Essential hypertension 10/23/2021   Mesenteric ischemia ([place]) 10/22/2021   Ischemia of extremity 08/17/2020   Sprain of right wrist 01/16/2020   DVT (deep venous thrombosis) ([place]) 12/21/2018   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 Please re-consult if needed.     Code Status:    Code Status Orders  (From admission, onward)           Start     Ordered   10/22/21 2008  Full code  Continuous        10/22/21 2008           Code Status History     Date Active Date Inactive Code Status Order ID Comments User Context   08/18/2020 0107 08/20/2020 2036 Full Code [id number]  Katha Cabal, MD Inpatient   03/09/2020 2311 03/12/2020 2022 Full Code [id number]  Collier Bullock, MD ED   12/21/2018 2252 12/23/2018 1607 Full Code [id number]  Vaughan Basta, MD Inpatient   01/15/2015 2112 01/18/2015 1652 Full Code [id number]  Hower, Aaron Mose, MD ED       Prognosis:  poor    Thank you for allowing the Palliative Medicine Team to assist in the care of this patient.     Asencion Gowda, NP  Please contact Palliative Medicine Team phone at [phone] for questions and concerns.

## 2021-10-31 LAB — CULTURE, BLOOD (ROUTINE X 2)
Culture: NO GROWTH
Culture: NO GROWTH
Special Requests: ADEQUATE
Special Requests: ADEQUATE

## 2021-11-20 ENCOUNTER — Other Ambulatory Visit (INDEPENDENT_AMBULATORY_CARE_PROVIDER_SITE_OTHER): Payer: Self-pay | Admitting: Vascular Surgery

## 2021-11-20 DIAGNOSIS — Z9862 Peripheral vascular angioplasty status: Secondary | ICD-10-CM

## 2021-11-20 DIAGNOSIS — K551 Chronic vascular disorders of intestine: Secondary | ICD-10-CM

## 2021-11-20 DIAGNOSIS — I998 Other disorder of circulatory system: Secondary | ICD-10-CM

## 2021-11-20 DIAGNOSIS — Z9889 Other specified postprocedural states: Secondary | ICD-10-CM

## 2021-11-21 ENCOUNTER — Ambulatory Visit (INDEPENDENT_AMBULATORY_CARE_PROVIDER_SITE_OTHER): Payer: Medicaid Other | Admitting: Vascular Surgery

## 2021-11-21 ENCOUNTER — Encounter (INDEPENDENT_AMBULATORY_CARE_PROVIDER_SITE_OTHER): Payer: Medicaid Other

## 2021-11-21 ENCOUNTER — Encounter: Payer: Self-pay | Admitting: Vascular Surgery

## 2021-11-21 ENCOUNTER — Other Ambulatory Visit (INDEPENDENT_AMBULATORY_CARE_PROVIDER_SITE_OTHER): Payer: Medicaid Other

## 2021-12-12 ENCOUNTER — Ambulatory Visit: Admission: RE | Admit: 2021-12-12 | Payer: Medicaid Other | Source: Ambulatory Visit | Admitting: Radiology

## 2021-12-13 ENCOUNTER — Other Ambulatory Visit: Payer: Self-pay | Admitting: Interventional Radiology

## 2021-12-13 ENCOUNTER — Other Ambulatory Visit: Payer: Self-pay | Admitting: Radiology

## 2021-12-13 ENCOUNTER — Ambulatory Visit
Admission: RE | Admit: 2021-12-13 | Discharge: 2021-12-13 | Disposition: A | Discharge status: Home / Self Care | Payer: Medicaid Other | Source: Ambulatory Visit | Attending: Radiology | Admitting: Radiology

## 2021-12-13 DIAGNOSIS — K819 Cholecystitis, unspecified: Secondary | ICD-10-CM | POA: Diagnosis present

## 2021-12-13 HISTORY — PX: IR CHOLANGIOGRAM EXISTING TUBE: IMG6040

## 2021-12-13 MED ORDER — LIDOCAINE HCL 1 % IJ SOLN
INTRAMUSCULAR | Status: AC
  Filled 2021-12-13: qty 20

## 2021-12-13 MED ORDER — IOHEXOL 350 MG/ML SOLN
10.0000 mL | Freq: Once | INTRAVENOUS | Status: AC | PRN
  Administered 2021-12-13: 10 mL

## 2021-12-19 ENCOUNTER — Other Ambulatory Visit: Payer: Self-pay | Admitting: Radiology

## 2021-12-20 ENCOUNTER — Encounter: Payer: Self-pay | Admitting: Radiology

## 2021-12-20 ENCOUNTER — Other Ambulatory Visit: Payer: Self-pay

## 2021-12-20 ENCOUNTER — Ambulatory Visit: Payer: Medicaid Other | Admitting: Radiology

## 2021-12-20 ENCOUNTER — Ambulatory Visit
Admission: RE | Admit: 2021-12-20 | Discharge: 2021-12-20 | Disposition: A | Discharge status: Home / Self Care | Payer: Medicaid Other | Source: Ambulatory Visit | Attending: Interventional Radiology | Admitting: Interventional Radiology

## 2021-12-20 DIAGNOSIS — K819 Cholecystitis, unspecified: Secondary | ICD-10-CM | POA: Insufficient documentation

## 2021-12-20 HISTORY — PX: IR CHOLANGIOGRAM EXISTING TUBE: IMG6040

## 2021-12-20 MED ORDER — IOHEXOL 350 MG/ML SOLN: 5.0000 mL | Freq: Once | INTRAVENOUS | Status: DC | PRN

## 2021-12-20 MED ORDER — MIDAZOLAM HCL 2 MG/2ML IJ SOLN
INTRAMUSCULAR | Status: AC | PRN
  Administered 2021-12-20: 1 mg via INTRAVENOUS

## 2021-12-20 MED ORDER — MIDAZOLAM HCL 2 MG/2ML IJ SOLN
INTRAMUSCULAR | Status: AC
  Filled 2021-12-20: qty 2

## 2021-12-20 MED ORDER — SODIUM CHLORIDE 0.9 % IV SOLN: INTRAVENOUS | Status: DC

## 2022-02-10 ENCOUNTER — Other Ambulatory Visit: Payer: Self-pay | Admitting: Adult Health

## 2022-02-10 ENCOUNTER — Other Ambulatory Visit: Payer: Self-pay | Admitting: Internal Medicine

## 2022-02-10 DIAGNOSIS — Z1231 Encounter for screening mammogram for malignant neoplasm of breast: Secondary | ICD-10-CM

## 2022-04-21 ENCOUNTER — Inpatient Hospital Stay (HOSPITAL_COMMUNITY): Payer: Medicaid Other | Admitting: Anesthesiology

## 2022-04-21 ENCOUNTER — Encounter
Admission: EM | Disposition: A | Discharge status: Other Acute Inpatient Hospital | Payer: Self-pay | Source: Home / Self Care | Attending: Emergency Medicine

## 2022-04-21 ENCOUNTER — Encounter (HOSPITAL_COMMUNITY): Admission: EM | Disposition: E | Payer: Self-pay | Source: Ambulatory Visit | Attending: Vascular Surgery

## 2022-04-21 ENCOUNTER — Emergency Department
Admission: EM | Admit: 2022-04-21 | Discharge: 2022-04-21 | Disposition: A | Discharge status: Other Acute Inpatient Hospital | Payer: Medicaid Other | Attending: Emergency Medicine | Admitting: Emergency Medicine

## 2022-04-21 ENCOUNTER — Other Ambulatory Visit: Payer: Self-pay

## 2022-04-21 ENCOUNTER — Emergency Department: Payer: Medicaid Other

## 2022-04-21 ENCOUNTER — Inpatient Hospital Stay (HOSPITAL_COMMUNITY)
Admission: EM | Admit: 2022-04-21 | Discharge: 2022-05-22 | DRG: 252 | Disposition: E | Payer: Medicaid Other | Source: Other Acute Inpatient Hospital | Attending: Vascular Surgery | Admitting: Vascular Surgery

## 2022-04-21 DIAGNOSIS — I10 Essential (primary) hypertension: Secondary | ICD-10-CM | POA: Diagnosis present

## 2022-04-21 DIAGNOSIS — E785 Hyperlipidemia, unspecified: Secondary | ICD-10-CM | POA: Diagnosis present

## 2022-04-21 DIAGNOSIS — K55052 Diffuse acute (reversible) ischemia of intestine, part unspecified: Secondary | ICD-10-CM | POA: Diagnosis present

## 2022-04-21 DIAGNOSIS — S301XXA Contusion of abdominal wall, initial encounter: Secondary | ICD-10-CM | POA: Diagnosis not present

## 2022-04-21 DIAGNOSIS — J449 Chronic obstructive pulmonary disease, unspecified: Secondary | ICD-10-CM | POA: Diagnosis present

## 2022-04-21 DIAGNOSIS — I708 Atherosclerosis of other arteries: Secondary | ICD-10-CM | POA: Diagnosis present

## 2022-04-21 DIAGNOSIS — Z515 Encounter for palliative care: Secondary | ICD-10-CM | POA: Diagnosis not present

## 2022-04-21 DIAGNOSIS — K55059 Acute (reversible) ischemia of intestine, part and extent unspecified: Secondary | ICD-10-CM | POA: Diagnosis present

## 2022-04-21 DIAGNOSIS — F17210 Nicotine dependence, cigarettes, uncomplicated: Secondary | ICD-10-CM | POA: Diagnosis present

## 2022-04-21 DIAGNOSIS — Z9049 Acquired absence of other specified parts of digestive tract: Secondary | ICD-10-CM | POA: Diagnosis not present

## 2022-04-21 DIAGNOSIS — D62 Acute posthemorrhagic anemia: Secondary | ICD-10-CM | POA: Diagnosis present

## 2022-04-21 DIAGNOSIS — R7402 Elevation of levels of lactic acid dehydrogenase (LDH): Secondary | ICD-10-CM | POA: Diagnosis present

## 2022-04-21 DIAGNOSIS — D6859 Other primary thrombophilia: Secondary | ICD-10-CM | POA: Diagnosis present

## 2022-04-21 DIAGNOSIS — I774 Celiac artery compression syndrome: Secondary | ICD-10-CM | POA: Diagnosis not present

## 2022-04-21 DIAGNOSIS — K551 Chronic vascular disorders of intestine: Secondary | ICD-10-CM

## 2022-04-21 DIAGNOSIS — Z8249 Family history of ischemic heart disease and other diseases of the circulatory system: Secondary | ICD-10-CM | POA: Diagnosis not present

## 2022-04-21 DIAGNOSIS — I959 Hypotension, unspecified: Secondary | ICD-10-CM | POA: Diagnosis not present

## 2022-04-21 DIAGNOSIS — D689 Coagulation defect, unspecified: Secondary | ICD-10-CM | POA: Diagnosis present

## 2022-04-21 DIAGNOSIS — R000 Tachycardia, unspecified: Secondary | ICD-10-CM | POA: Diagnosis not present

## 2022-04-21 DIAGNOSIS — R188 Other ascites: Secondary | ICD-10-CM | POA: Diagnosis present

## 2022-04-21 DIAGNOSIS — I739 Peripheral vascular disease, unspecified: Secondary | ICD-10-CM | POA: Diagnosis present

## 2022-04-21 DIAGNOSIS — Z66 Do not resuscitate: Secondary | ICD-10-CM | POA: Diagnosis present

## 2022-04-21 DIAGNOSIS — R579 Shock, unspecified: Secondary | ICD-10-CM | POA: Diagnosis not present

## 2022-04-21 DIAGNOSIS — K6389 Other specified diseases of intestine: Secondary | ICD-10-CM | POA: Diagnosis not present

## 2022-04-21 DIAGNOSIS — J9811 Atelectasis: Secondary | ICD-10-CM | POA: Diagnosis not present

## 2022-04-21 DIAGNOSIS — Z86718 Personal history of other venous thrombosis and embolism: Secondary | ICD-10-CM

## 2022-04-21 DIAGNOSIS — I82409 Acute embolism and thrombosis of unspecified deep veins of unspecified lower extremity: Secondary | ICD-10-CM | POA: Diagnosis not present

## 2022-04-21 DIAGNOSIS — Z89201 Acquired absence of right upper limb, unspecified level: Secondary | ICD-10-CM

## 2022-04-21 DIAGNOSIS — E872 Acidosis, unspecified: Secondary | ICD-10-CM | POA: Diagnosis not present

## 2022-04-21 DIAGNOSIS — J9601 Acute respiratory failure with hypoxia: Secondary | ICD-10-CM | POA: Diagnosis not present

## 2022-04-21 DIAGNOSIS — K559 Vascular disorder of intestine, unspecified: Secondary | ICD-10-CM

## 2022-04-21 DIAGNOSIS — R109 Unspecified abdominal pain: Secondary | ICD-10-CM | POA: Diagnosis present

## 2022-04-21 HISTORY — PX: LAPAROTOMY: SHX154

## 2022-04-21 HISTORY — PX: AORTOGRAM: SHX6300

## 2022-04-21 HISTORY — PX: ANGIOPLASTY: SHX39

## 2022-04-21 LAB — POCT I-STAT 7, (LYTES, BLD GAS, ICA,H+H)
Acid-base deficit: 3 mmol/L — ABNORMAL HIGH (ref 0.0–2.0)
Bicarbonate: 22 mmol/L (ref 20.0–28.0)
Calcium, Ion: 1.15 mmol/L (ref 1.15–1.40)
HCT: 36 % (ref 36.0–46.0)
Hemoglobin: 12.2 g/dL (ref 12.0–15.0)
O2 Saturation: 97 %
Patient temperature: 36.6
Potassium: 3.5 mmol/L (ref 3.5–5.1)
Sodium: 135 mmol/L (ref 135–145)
TCO2: 23 mmol/L (ref 22–32)
pCO2 arterial: 37.4 mmHg (ref 32–48)
pH, Arterial: 7.375 (ref 7.35–7.45)
pO2, Arterial: 87 mmHg (ref 83–108)

## 2022-04-21 LAB — LACTIC ACID, PLASMA
Lactic Acid, Venous: 3.8 mmol/L (ref 0.5–1.9)
Lactic Acid, Venous: 2.3 mmol/L (ref 0.5–1.9)
Lactic Acid, Venous: 1.8 mmol/L (ref 0.5–1.9)

## 2022-04-21 LAB — CBC
HCT: 33.3 % — ABNORMAL LOW (ref 36.0–46.0)
HCT: 30.9 % — ABNORMAL LOW (ref 36.0–46.0)
Hemoglobin: 9.8 g/dL — ABNORMAL LOW (ref 12.0–15.0)
Hemoglobin: 9.3 g/dL — ABNORMAL LOW (ref 12.0–15.0)
MCH: 20.8 pg — ABNORMAL LOW (ref 26.0–34.0)
MCH: 21.3 pg — ABNORMAL LOW (ref 26.0–34.0)
MCHC: 29.4 g/dL — ABNORMAL LOW (ref 30.0–36.0)
MCHC: 30.1 g/dL (ref 30.0–36.0)
MCV: 70.6 fL — ABNORMAL LOW (ref 80.0–100.0)
MCV: 70.9 fL — ABNORMAL LOW (ref 80.0–100.0)
Platelets: 534 10*3/uL — ABNORMAL HIGH (ref 150–400)
Platelets: 423 10*3/uL — ABNORMAL HIGH (ref 150–400)
RBC: 4.72 MIL/uL (ref 3.87–5.11)
RBC: 4.36 MIL/uL (ref 3.87–5.11)
RDW: 22.3 % — ABNORMAL HIGH (ref 11.5–15.5)
RDW: 22.7 % — ABNORMAL HIGH (ref 11.5–15.5)
WBC: 19.4 10*3/uL — ABNORMAL HIGH (ref 4.0–10.5)
WBC: 24.5 10*3/uL — ABNORMAL HIGH (ref 4.0–10.5)
nRBC: 0 % (ref 0.0–0.2)
nRBC: 0 % (ref 0.0–0.2)

## 2022-04-21 LAB — COMPREHENSIVE METABOLIC PANEL
ALT: 9 U/L (ref 0–44)
AST: 21 U/L (ref 15–41)
Albumin: 3.8 g/dL (ref 3.5–5.0)
Alkaline Phosphatase: 89 U/L (ref 38–126)
Anion gap: 13 (ref 5–15)
BUN: 7 mg/dL (ref 6–20)
CO2: 18 mmol/L — ABNORMAL LOW (ref 22–32)
Calcium: 8.8 mg/dL — ABNORMAL LOW (ref 8.9–10.3)
Chloride: 102 mmol/L (ref 98–111)
Creatinine, Ser: 0.79 mg/dL (ref 0.44–1.00)
GFR, Estimated: >60 mL/min (ref >60)
Glucose, Bld: 197 mg/dL — ABNORMAL HIGH (ref 70–99)
Potassium: 3 mmol/L — ABNORMAL LOW (ref 3.5–5.1)
Sodium: 133 mmol/L — ABNORMAL LOW (ref 135–145)
Total Bilirubin: 0.7 mg/dL (ref 0.3–1.2)
Total Protein: 8.4 g/dL — ABNORMAL HIGH (ref 6.5–8.1)

## 2022-04-21 LAB — BASIC METABOLIC PANEL
Anion gap: 11 (ref 5–15)
BUN: 6 mg/dL (ref 6–20)
CO2: 21 mmol/L — ABNORMAL LOW (ref 22–32)
Calcium: 8 mg/dL — ABNORMAL LOW (ref 8.9–10.3)
Chloride: 100 mmol/L (ref 98–111)
Creatinine, Ser: 0.8 mg/dL (ref 0.44–1.00)
GFR, Estimated: >60 mL/min (ref >60)
Glucose, Bld: 211 mg/dL — ABNORMAL HIGH (ref 70–99)
Potassium: 3.3 mmol/L — ABNORMAL LOW (ref 3.5–5.1)
Sodium: 132 mmol/L — ABNORMAL LOW (ref 135–145)

## 2022-04-21 LAB — HCG, QUANTITATIVE, PREGNANCY: hCG, Beta Chain, Quant, S: <1 m[IU]/mL (ref <5)

## 2022-04-21 LAB — LIPASE, BLOOD: Lipase: 31 U/L (ref 11–51)

## 2022-04-21 LAB — POCT ACTIVATED CLOTTING TIME: Activated Clotting Time: 228 seconds

## 2022-04-21 SURGERY — LAPAROTOMY, EXPLORATORY
Anesthesia: Choice

## 2022-04-21 SURGERY — LAPAROTOMY, EXPLORATORY
Anesthesia: General | Site: Groin | Laterality: Right

## 2022-04-21 MED ORDER — PROPOFOL 10 MG/ML IV BOLUS
INTRAVENOUS | Status: DC | PRN
  Administered 2022-04-21: 130 mg via INTRAVENOUS

## 2022-04-21 MED ORDER — 0.9 % SODIUM CHLORIDE (POUR BTL) OPTIME
TOPICAL | Status: DC | PRN
  Administered 2022-04-21: 1000 mL

## 2022-04-21 MED ORDER — FENTANYL CITRATE (PF) 100 MCG/2ML IJ SOLN: 25.0000 ug | INTRAMUSCULAR | Status: DC | PRN

## 2022-04-21 MED ORDER — ACETAMINOPHEN 10 MG/ML IV SOLN
INTRAVENOUS | Status: DC | PRN
  Administered 2022-04-21: 1000 mg via INTRAVENOUS

## 2022-04-21 MED ORDER — SUCCINYLCHOLINE CHLORIDE 200 MG/10ML IV SOSY
PREFILLED_SYRINGE | INTRAVENOUS | Status: DC | PRN
  Administered 2022-04-21: 160 mg via INTRAVENOUS

## 2022-04-21 MED ORDER — LIDOCAINE 2% (20 MG/ML) 5 ML SYRINGE
INTRAMUSCULAR | Status: DC | PRN
  Administered 2022-04-21: 60 mg via INTRAVENOUS

## 2022-04-21 MED ORDER — ACETAMINOPHEN 160 MG/5ML PO SOLN: 1000.0000 mg | Freq: Once | ORAL | Status: DC | PRN

## 2022-04-21 MED ORDER — NICARDIPINE HCL IN NACL 20-0.86 MG/200ML-% IV SOLN
5.0000 mg/h | INTRAVENOUS | Status: DC
  Administered 2022-04-21: 5 mg/h via INTRAVENOUS

## 2022-04-21 MED ORDER — ACETAMINOPHEN 10 MG/ML IV SOLN: 1000.0000 mg | Freq: Once | INTRAVENOUS | Status: DC | PRN

## 2022-04-21 MED ORDER — HYDRALAZINE HCL 20 MG/ML IJ SOLN
INTRAMUSCULAR | Status: AC
  Filled 2022-04-21: qty 1

## 2022-04-21 MED ORDER — HEPARIN BOLUS VIA INFUSION
4500.0000 [IU] | Freq: Once | INTRAVENOUS | Status: AC
  Administered 2022-04-21: 4500 [IU] via INTRAVENOUS
  Filled 2022-04-21: qty 4500

## 2022-04-21 MED ORDER — LABETALOL HCL 5 MG/ML IV SOLN
INTRAVENOUS | Status: DC | PRN
  Administered 2022-04-21 (×4): 5 mg via INTRAVENOUS

## 2022-04-21 MED ORDER — ACETAMINOPHEN 10 MG/ML IV SOLN
INTRAVENOUS | Status: AC
  Filled 2022-04-21: qty 100

## 2022-04-21 MED ORDER — DEXAMETHASONE SODIUM PHOSPHATE 10 MG/ML IJ SOLN
INTRAMUSCULAR | Status: DC | PRN
  Administered 2022-04-21: 5 mg via INTRAVENOUS

## 2022-04-21 MED ORDER — LABETALOL HCL 5 MG/ML IV SOLN
INTRAVENOUS | Status: AC
  Filled 2022-04-21: qty 4

## 2022-04-21 MED ORDER — FENTANYL CITRATE (PF) 250 MCG/5ML IJ SOLN
INTRAMUSCULAR | Status: AC
  Filled 2022-04-21: qty 5

## 2022-04-21 MED ORDER — SODIUM CHLORIDE 0.9 % IV BOLUS
1000.0000 mL | Freq: Once | INTRAVENOUS | Status: AC
  Administered 2022-04-21: 1000 mL via INTRAVENOUS

## 2022-04-21 MED ORDER — HEPARIN (PORCINE) 25000 UT/250ML-% IV SOLN
INTRAVENOUS | Status: AC
  Filled 2022-04-21: qty 250

## 2022-04-21 MED ORDER — HYDROMORPHONE HCL 1 MG/ML IJ SOLN
1.0000 mg | Freq: Once | INTRAMUSCULAR | Status: AC
  Administered 2022-04-21: 1 mg via INTRAVENOUS
  Filled 2022-04-21: qty 1

## 2022-04-21 MED ORDER — HEPARIN SODIUM (PORCINE) 1000 UNIT/ML IJ SOLN
INTRAMUSCULAR | Status: DC | PRN
  Administered 2022-04-21: 3000 [IU] via INTRAVENOUS
  Administered 2022-04-21: 7000 [IU] via INTRAVENOUS

## 2022-04-21 MED ORDER — ONDANSETRON HCL 4 MG/2ML IJ SOLN
INTRAMUSCULAR | Status: AC
  Filled 2022-04-21: qty 2

## 2022-04-21 MED ORDER — SUGAMMADEX SODIUM 200 MG/2ML IV SOLN
INTRAVENOUS | Status: DC | PRN
  Administered 2022-04-21: 125 mg via INTRAVENOUS
  Administered 2022-04-21: 75 mg via INTRAVENOUS

## 2022-04-21 MED ORDER — POTASSIUM CHLORIDE CRYS ER 20 MEQ PO TBCR
20.0000 meq | EXTENDED_RELEASE_TABLET | Freq: Once | ORAL | Status: AC
  Administered 2022-04-22: 40 meq via ORAL
  Filled 2022-04-21: qty 2

## 2022-04-21 MED ORDER — PHENOL 1.4 % MT LIQD: 1.0000 | OROMUCOSAL | Status: DC | PRN

## 2022-04-21 MED ORDER — STERILE WATER FOR IRRIGATION IR SOLN
Status: DC | PRN
  Administered 2022-04-21: 1000 mL

## 2022-04-21 MED ORDER — LABETALOL HCL 5 MG/ML IV SOLN
10.0000 mg | INTRAVENOUS | Status: AC | PRN
  Administered 2022-04-22 (×4): 10 mg via INTRAVENOUS
  Filled 2022-04-21 (×4): qty 4

## 2022-04-21 MED ORDER — MIDAZOLAM HCL 2 MG/2ML IJ SOLN
INTRAMUSCULAR | Status: AC
  Filled 2022-04-21: qty 2

## 2022-04-21 MED ORDER — ACETAMINOPHEN 325 MG PO TABS
650.0000 mg | ORAL_TABLET | Freq: Four times a day (QID) | ORAL | Status: DC
  Administered 2022-04-22: 650 mg via ORAL
  Filled 2022-04-21 (×2): qty 2

## 2022-04-21 MED ORDER — HEPARIN (PORCINE) 25000 UT/250ML-% IV SOLN
1650.0000 [IU]/h | INTRAVENOUS | Status: DC
  Administered 2022-04-21: 1000 [IU]/h via INTRAVENOUS
  Administered 2022-04-22: 1050 [IU]/h via INTRAVENOUS
  Administered 2022-04-23: 1300 [IU]/h via INTRAVENOUS
  Administered 2022-04-24: 1550 [IU]/h via INTRAVENOUS
  Administered 2022-04-25: 1650 [IU]/h via INTRAVENOUS
  Filled 2022-04-21 (×5): qty 250

## 2022-04-21 MED ORDER — SUCCINYLCHOLINE CHLORIDE 200 MG/10ML IV SOSY
PREFILLED_SYRINGE | INTRAVENOUS | Status: AC
  Filled 2022-04-21: qty 10

## 2022-04-21 MED ORDER — MORPHINE SULFATE (PF) 4 MG/ML IV SOLN
4.0000 mg | Freq: Once | INTRAVENOUS | Status: AC
  Administered 2022-04-21: 4 mg via INTRAVENOUS
  Filled 2022-04-21: qty 1

## 2022-04-21 MED ORDER — PROPOFOL 10 MG/ML IV BOLUS
INTRAVENOUS | Status: AC
  Filled 2022-04-21: qty 20

## 2022-04-21 MED ORDER — CEFAZOLIN SODIUM-DEXTROSE 2-3 GM-%(50ML) IV SOLR
INTRAVENOUS | Status: DC | PRN
  Administered 2022-04-21: 2 g via INTRAVENOUS

## 2022-04-21 MED ORDER — FENTANYL CITRATE (PF) 250 MCG/5ML IJ SOLN
INTRAMUSCULAR | Status: DC | PRN
  Administered 2022-04-21 (×3): 50 ug via INTRAVENOUS

## 2022-04-21 MED ORDER — OXYCODONE HCL 5 MG PO TABS
5.0000 mg | ORAL_TABLET | ORAL | Status: DC | PRN
  Administered 2022-04-22 – 2022-04-24 (×7): 10 mg via ORAL
  Filled 2022-04-21 (×7): qty 2

## 2022-04-21 MED ORDER — ROCURONIUM BROMIDE 10 MG/ML (PF) SYRINGE
PREFILLED_SYRINGE | INTRAVENOUS | Status: DC | PRN
  Administered 2022-04-21: 50 mg via INTRAVENOUS

## 2022-04-21 MED ORDER — HYDRALAZINE HCL 20 MG/ML IJ SOLN
10.0000 mg | Freq: Once | INTRAMUSCULAR | Status: AC
  Administered 2022-04-21: 10 mg via INTRAVENOUS

## 2022-04-21 MED ORDER — IOHEXOL 350 MG/ML SOLN
100.0000 mL | Freq: Once | INTRAVENOUS | Status: AC | PRN
  Administered 2022-04-21: 100 mL via INTRAVENOUS

## 2022-04-21 MED ORDER — GUAIFENESIN-DM 100-10 MG/5ML PO SYRP: 15.0000 mL | ORAL_SOLUTION | ORAL | Status: DC | PRN

## 2022-04-21 MED ORDER — DEXAMETHASONE SODIUM PHOSPHATE 10 MG/ML IJ SOLN
INTRAMUSCULAR | Status: AC
  Filled 2022-04-21: qty 1

## 2022-04-21 MED ORDER — ONDANSETRON HCL 4 MG/2ML IJ SOLN
INTRAMUSCULAR | Status: DC | PRN
  Administered 2022-04-21: 4 mg via INTRAVENOUS

## 2022-04-21 MED ORDER — ROCURONIUM BROMIDE 10 MG/ML (PF) SYRINGE
PREFILLED_SYRINGE | INTRAVENOUS | Status: AC
  Filled 2022-04-21: qty 10

## 2022-04-21 MED ORDER — METOPROLOL TARTRATE 5 MG/5ML IV SOLN: 2.0000 mg | INTRAVENOUS | Status: DC | PRN

## 2022-04-21 MED ORDER — LIDOCAINE 2% (20 MG/ML) 5 ML SYRINGE
INTRAMUSCULAR | Status: AC
  Filled 2022-04-21: qty 5

## 2022-04-21 MED ORDER — PHENYLEPHRINE 80 MCG/ML (10ML) SYRINGE FOR IV PUSH (FOR BLOOD PRESSURE SUPPORT)
PREFILLED_SYRINGE | INTRAVENOUS | Status: AC
  Filled 2022-04-21: qty 10

## 2022-04-21 MED ORDER — OXYCODONE HCL 5 MG PO TABS: 5.0000 mg | ORAL_TABLET | Freq: Once | ORAL | Status: DC | PRN

## 2022-04-21 MED ORDER — ONDANSETRON HCL 4 MG/2ML IJ SOLN
4.0000 mg | Freq: Once | INTRAMUSCULAR | Status: AC
  Administered 2022-04-21: 4 mg via INTRAVENOUS
  Filled 2022-04-21: qty 2

## 2022-04-21 MED ORDER — HEPARIN SODIUM (PORCINE) 1000 UNIT/ML IJ SOLN
INTRAMUSCULAR | Status: AC
  Filled 2022-04-21: qty 10

## 2022-04-21 MED ORDER — ALUM & MAG HYDROXIDE-SIMETH 200-200-20 MG/5ML PO SUSP: 15.0000 mL | ORAL | Status: DC | PRN

## 2022-04-21 MED ORDER — MIDAZOLAM HCL 2 MG/2ML IJ SOLN
INTRAMUSCULAR | Status: DC | PRN
  Administered 2022-04-21: 2 mg via INTRAVENOUS

## 2022-04-21 MED ORDER — LACTATED RINGERS IV SOLN: INTRAVENOUS | Status: DC

## 2022-04-21 MED ORDER — ONDANSETRON HCL 4 MG/2ML IJ SOLN: 4.0000 mg | Freq: Four times a day (QID) | INTRAMUSCULAR | Status: DC | PRN

## 2022-04-21 MED ORDER — PANTOPRAZOLE SODIUM 40 MG PO TBEC
40.0000 mg | DELAYED_RELEASE_TABLET | Freq: Every day | ORAL | Status: DC
  Administered 2022-04-22 – 2022-04-25 (×5): 40 mg via ORAL
  Filled 2022-04-21 (×5): qty 1

## 2022-04-21 MED ORDER — CEFAZOLIN SODIUM 1 G IJ SOLR
INTRAMUSCULAR | Status: AC
  Filled 2022-04-21: qty 20

## 2022-04-21 MED ORDER — FENTANYL CITRATE PF 50 MCG/ML IJ SOSY
50.0000 ug | PREFILLED_SYRINGE | Freq: Once | INTRAMUSCULAR | Status: AC
  Administered 2022-04-21: 50 ug via INTRAVENOUS
  Filled 2022-04-21: qty 1

## 2022-04-21 MED ORDER — DEXMEDETOMIDINE HCL IN NACL 80 MCG/20ML IV SOLN
INTRAVENOUS | Status: DC | PRN
  Administered 2022-04-21: 8 ug via BUCCAL

## 2022-04-21 MED ORDER — OXYCODONE HCL 5 MG/5ML PO SOLN: 5.0000 mg | Freq: Once | ORAL | Status: DC | PRN

## 2022-04-21 MED ORDER — LACTATED RINGERS IV SOLN: INTRAVENOUS | Status: DC | PRN

## 2022-04-21 MED ORDER — HEPARIN 6000 UNIT IRRIGATION SOLUTION
Status: DC | PRN
  Administered 2022-04-21: 1

## 2022-04-21 MED ORDER — MORPHINE SULFATE (PF) 4 MG/ML IV SOLN
4.0000 mg | INTRAVENOUS | Status: DC | PRN
  Administered 2022-04-22 – 2022-04-23 (×5): 4 mg via INTRAVENOUS
  Filled 2022-04-21 (×5): qty 1

## 2022-04-21 MED ORDER — HYDRALAZINE HCL 20 MG/ML IJ SOLN
5.0000 mg | INTRAMUSCULAR | Status: DC | PRN
  Administered 2022-04-22: 5 mg via INTRAVENOUS
  Filled 2022-04-21: qty 1

## 2022-04-21 MED ORDER — ONDANSETRON HCL 4 MG/2ML IJ SOLN
4.0000 mg | Freq: Once | INTRAMUSCULAR | Status: DC
  Filled 2022-04-21: qty 2

## 2022-04-21 MED ORDER — ONDANSETRON HCL 4 MG/2ML IJ SOLN
4.0000 mg | Freq: Once | INTRAMUSCULAR | Status: AC | PRN
  Administered 2022-04-21: 4 mg via INTRAVENOUS
  Filled 2022-04-21: qty 2

## 2022-04-21 MED ORDER — DEXMEDETOMIDINE HCL IN NACL 80 MCG/20ML IV SOLN
INTRAVENOUS | Status: AC
  Filled 2022-04-21: qty 20

## 2022-04-21 MED ORDER — NICARDIPINE HCL IN NACL 20-0.86 MG/200ML-% IV SOLN
INTRAVENOUS | Status: AC
  Administered 2022-04-21: 20 mg
  Filled 2022-04-21: qty 200

## 2022-04-21 MED ORDER — IODIXANOL 320 MG/ML IV SOLN
INTRAVENOUS | Status: DC | PRN
  Administered 2022-04-21: 55 mL via INTRA_ARTERIAL

## 2022-04-21 MED ORDER — CHLORHEXIDINE GLUCONATE CLOTH 2 % EX PADS
6.0000 | MEDICATED_PAD | Freq: Every day | CUTANEOUS | Status: DC
  Administered 2022-04-21 – 2022-04-25 (×4): 6 via TOPICAL

## 2022-04-21 MED ORDER — HEPARIN (PORCINE) 25000 UT/250ML-% IV SOLN
1100.0000 [IU]/h | INTRAVENOUS | Status: DC
  Administered 2022-04-21: 1100 [IU]/h via INTRAVENOUS
  Filled 2022-04-21: qty 250

## 2022-04-21 MED ORDER — ACETAMINOPHEN 500 MG PO TABS: 1000.0000 mg | ORAL_TABLET | Freq: Once | ORAL | Status: DC | PRN

## 2022-04-21 SURGICAL SUPPLY — 50 items
BAG COUNTER SPONGE SURGICOUNT (BAG) ×2 IMPLANT
BALLN MUSTANG 6.0X20 75 (BALLOONS) ×2
BALLOON MUSTANG 6.0X20 75 (BALLOONS) IMPLANT
CANISTER SUCT 3000ML PPV (MISCELLANEOUS) ×2 IMPLANT
CATH OMNI FLUSH .035X70CM (CATHETERS) IMPLANT
CLIP VESOCCLUDE MED 24/CT (CLIP) ×2 IMPLANT
CLIP VESOCCLUDE SM WIDE 24/CT (CLIP) ×2 IMPLANT
CLOSURE PERCLOSE PROSTYLE (VASCULAR PRODUCTS) IMPLANT
COVER MAYO STAND STRL (DRAPES) ×2 IMPLANT
DERMABOND ADVANCED .7 DNX12 (GAUZE/BANDAGES/DRESSINGS) ×4 IMPLANT
DRSG COVADERM 4X6 (GAUZE/BANDAGES/DRESSINGS) IMPLANT
DRSG COVADERM 4X8 (GAUZE/BANDAGES/DRESSINGS) IMPLANT
DRSG TEGADERM 2-3/8X2-3/4 SM (GAUZE/BANDAGES/DRESSINGS) IMPLANT
ELECT BLADE 4.0 EZ CLEAN MEGAD (MISCELLANEOUS) ×2
ELECT BLADE 6.5 EXT (BLADE) IMPLANT
ELECT CAUTERY BLADE 6.4 (BLADE) IMPLANT
ELECT REM PT RETURN 9FT ADLT (ELECTROSURGICAL) ×2
ELECTRODE BLDE 4.0 EZ CLN MEGD (MISCELLANEOUS) ×2 IMPLANT
ELECTRODE REM PT RTRN 9FT ADLT (ELECTROSURGICAL) ×2 IMPLANT
FELT TEFLON 1X6 (MISCELLANEOUS) IMPLANT
GAUZE SPONGE 2X2 8PLY STRL LF (GAUZE/BANDAGES/DRESSINGS) IMPLANT
GAUZE SPONGE 2X2 STRL 8-PLY (GAUZE/BANDAGES/DRESSINGS) IMPLANT
GLIDEWIRE ADV .035X260CM (WIRE) IMPLANT
GLOVE SURG SS PI 7.5 STRL IVOR (GLOVE) ×6 IMPLANT
GOWN STRL REUS W/ TWL LRG LVL3 (GOWN DISPOSABLE) ×4 IMPLANT
GOWN STRL REUS W/ TWL XL LVL3 (GOWN DISPOSABLE) ×2 IMPLANT
GOWN STRL REUS W/TWL LRG LVL3 (GOWN DISPOSABLE) ×4
GOWN STRL REUS W/TWL XL LVL3 (GOWN DISPOSABLE) ×2
HEMOSTAT SNOW SURGICEL 2X4 (HEMOSTASIS) IMPLANT
INSERT FOGARTY 61MM (MISCELLANEOUS) ×2 IMPLANT
INSERT FOGARTY SM (MISCELLANEOUS) ×4 IMPLANT
KIT BASIN OR (CUSTOM PROCEDURE TRAY) ×2 IMPLANT
KIT ENCORE 26 ADVANTAGE (KITS) IMPLANT
KIT TURNOVER KIT B (KITS) ×2 IMPLANT
NS IRRIG 1000ML POUR BTL (IV SOLUTION) ×4 IMPLANT
PACK AORTA (CUSTOM PROCEDURE TRAY) ×2 IMPLANT
PAD ARMBOARD 7.5X6 YLW CONV (MISCELLANEOUS) ×4 IMPLANT
SET MICROPUNCTURE 5F STIFF (MISCELLANEOUS) IMPLANT
SHEATH GUIDING 7F 55X73X9MM TD (SHEATH) IMPLANT
SHEATH PINNACLE 6F 10CM (SHEATH) IMPLANT
STAPLER VISISTAT 35W (STAPLE) IMPLANT
STOPCOCK MORSE 400PSI 3WAY (MISCELLANEOUS) IMPLANT
SUT PDS AB 0 CT1 27 (SUTURE) IMPLANT
SYR BULB IRRIG 60ML STRL (SYRINGE) IMPLANT
TOWEL GREEN STERILE (TOWEL DISPOSABLE) ×2 IMPLANT
TOWEL ~~LOC~~+RFID 17X26 BLUE (SPONGE) ×4 IMPLANT
TRAY FOLEY MTR SLVR 16FR STAT (SET/KITS/TRAYS/PACK) ×2 IMPLANT
TUBING INJECTOR 48 (MISCELLANEOUS) IMPLANT
WATER STERILE IRR 1000ML POUR (IV SOLUTION) ×4 IMPLANT
WIRE BENTSON .035X145CM (WIRE) IMPLANT

## 2022-04-21 SURGICAL SUPPLY — 38 items
CHLORAPREP W/TINT 26 (MISCELLANEOUS) ×1 IMPLANT
DRAPE LAPAROTOMY 100X77 ABD (DRAPES) ×1 IMPLANT
DRSG OPSITE POSTOP 4X12 (GAUZE/BANDAGES/DRESSINGS) IMPLANT
DRSG TEGADERM 4X10 (GAUZE/BANDAGES/DRESSINGS) IMPLANT
ELECT BLADE 6.5 EXT (BLADE) ×1 IMPLANT
ELECT CAUTERY BLADE TIP 2.5 (TIP) ×1
ELECT REM PT RETURN 9FT ADLT (ELECTROSURGICAL) ×1
ELECTRODE CAUTERY BLDE TIP 2.5 (TIP) ×1 IMPLANT
ELECTRODE REM PT RTRN 9FT ADLT (ELECTROSURGICAL) ×1 IMPLANT
GAUZE 4X4 16PLY ~~LOC~~+RFID DBL (SPONGE) ×1 IMPLANT
GAUZE SPONGE 4X4 12PLY STRL (GAUZE/BANDAGES/DRESSINGS) ×1 IMPLANT
GLOVE SURG SYN 7.0 (GLOVE) ×2 IMPLANT
GLOVE SURG SYN 7.5  E (GLOVE) ×2
GLOVE SURG SYN 7.5 E (GLOVE) ×2 IMPLANT
GOWN STRL REUS W/ TWL LRG LVL3 (GOWN DISPOSABLE) ×4 IMPLANT
GOWN STRL REUS W/TWL LRG LVL3 (GOWN DISPOSABLE) ×4
LABEL OR SOLS (LABEL) ×1 IMPLANT
LIGASURE IMPACT 36 18CM CVD LR (INSTRUMENTS) IMPLANT
MANIFOLD NEPTUNE II (INSTRUMENTS) ×1 IMPLANT
NEEDLE HYPO 22GX1.5 SAFETY (NEEDLE) ×1 IMPLANT
NS IRRIG 1000ML POUR BTL (IV SOLUTION) ×1 IMPLANT
PACK BASIN MAJOR ARMC (MISCELLANEOUS) ×1 IMPLANT
PACK COLON CLEAN CLOSURE (MISCELLANEOUS) ×1 IMPLANT
SEPRAFILM MEMBRANE 5X6 (MISCELLANEOUS) IMPLANT
SPONGE T-LAP 18X18 ~~LOC~~+RFID (SPONGE) ×3 IMPLANT
STAPLER SKIN PROX 35W (STAPLE) ×1 IMPLANT
SUT PDS AB 1 CT1 36 (SUTURE) ×1 IMPLANT
SUT PROLENE 2 0 SH DA (SUTURE) IMPLANT
SUT SILK 2 0 (SUTURE) ×1
SUT SILK 2-0 18XBRD TIE 12 (SUTURE) ×1 IMPLANT
SUT SILK 3-0 (SUTURE) ×1 IMPLANT
SUT VIC AB 3-0 SH 27 (SUTURE) ×1
SUT VIC AB 3-0 SH 27X BRD (SUTURE) ×1 IMPLANT
SYR 10ML LL (SYRINGE) ×1 IMPLANT
SYR 20ML LL LF (SYRINGE) IMPLANT
TRAP FLUID SMOKE EVACUATOR (MISCELLANEOUS) ×1 IMPLANT
TRAY FOLEY MTR SLVR 16FR STAT (SET/KITS/TRAYS/PACK) ×1 IMPLANT
WATER STERILE IRR 500ML POUR (IV SOLUTION) ×1 IMPLANT

## 2022-04-21 NOTE — Anesthesia Procedure Notes (Signed)
Central Venous Catheter Insertion Performed by: Oleta Mouse, MD, anesthesiologist Start/End01/12/2022 7:18 AM, 05/06/2022 7:25 AM Patient location: Pre-op. Preanesthetic checklist: patient identified, IV checked, risks and benefits discussed, surgical consent, monitors and equipment checked, pre-op evaluation, timeout performed and anesthesia consent Patient sedated Hand hygiene performed  and maximum sterile barriers used  Catheter size: 8 Fr Total catheter length 16. Central line was placed.Double lumen Procedure performed using ultrasound guided technique. Ultrasound Notes:anatomy identified, needle tip was noted to be adjacent to the nerve/plexus identified, no ultrasound evidence of intravascular and/or intraneural injection and image(s) printed for medical record Attempts: 1 Following insertion, dressing applied, line sutured and Biopatch. Post procedure assessment: blood return through all ports, free fluid flow and no air  Patient tolerated the procedure well with no immediate complications.

## 2022-04-21 NOTE — ED Provider Notes (Signed)
[facility] Provider Note    Event Date/Time   First MD Initiated Contact with Patient 04/23/2022 1308     (approximate)   History   Abdominal Pain   HPI {Remember to add pertinent medical, surgical, social, and/or OB history to HPI:1} Suzanne [name] is a 47 y.o. female  who presents to the emergency department today because of concern for abdominal pain. Started two days ago. Located throughout her abdomen. Accompanied by nausea and vomiting. Patient has history of blood clots and states she has not taken her anticoagulant in the past two days. Additionally she says she has history of gallbladder disease but has not yet had it removed.        Physical Exam   Triage Vital Signs: ED Triage Vitals  Enc Vitals Group     BP --      Pulse Rate 05/20/2022 1033 90     Resp 05/17/2022 1033 (!) 22     Temp 05/01/2022 1033 97.7 F (36.5 C)     Temp Source 05/10/2022 1033 Oral     SpO2 04/26/2022 1033 94 %     Weight 05/13/2022 1036 140 lb (63.5 kg)     Height 04/29/2022 1036 '5\' 7"'$  (1.702 m)     Head Circumference --      Peak Flow --      Pain Score 04/22/2022 1035 10     Pain Loc --      Pain Edu? --      Excl. in [place]? --     Most recent vital signs: Vitals:   05/04/2022 1033 05/03/2022 1121  Pulse: 90 96  Resp: (!) 22 20  Temp: 97.7 F (36.5 C) 98.6 F (37 C)  SpO2: 94% 99%   General: Awake, alert, oriented. CV:  Good peripheral perfusion. Regular rate and rhythm. Resp:  Normal effort.  Abd:  No distention. Soft. Diffusely tender to palpation.  Other:  S/p right arm amputation.   ED Results / Procedures / Treatments   Labs (all labs ordered are listed, but only abnormal results are displayed) Labs Reviewed  COMPREHENSIVE METABOLIC PANEL - Abnormal; Notable for the following components:      Result Value   Sodium 133 (*)    Potassium 3.0 (*)    CO2 18 (*)    Glucose, Bld 197 (*)    Calcium 8.8 (*)    Total Protein 8.4 (*)    All other components  within normal limits  CBC - Abnormal; Notable for the following components:   WBC 19.4 (*)    Hemoglobin 9.8 (*)    HCT 33.3 (*)    MCV 70.6 (*)    MCH 20.8 (*)    MCHC 29.4 (*)    RDW 22.3 (*)    Platelets 534 (*)    All other components within normal limits  LIPASE, BLOOD  URINALYSIS, ROUTINE W REFLEX MICROSCOPIC  POC URINE PREG, ED     EKG  I, Nance Pear, attending physician, personally viewed and interpreted this EKG  EKG Time: 1116 Rate: 94 Rhythm: normal sinus rhythm Axis: normal Intervals: qtc 500 QRS: narrow ST changes: no st elevation Impression: bnormal ekg    RADIOLOGY *** {USE THE WORD "INTERPRETED"!! You MUST document your own interpretation of imaging, as well as the fact that you reviewed the radiologist's report!:1}   PROCEDURES:  Critical Care performed: {CriticalCareYesNo:19197::"Yes, see critical care procedure note(s)","No"}  Procedures   MEDICATIONS ORDERED IN ED: Medications  ondansetron (ZOFRAN) injection 4 mg (0 mg Intravenous Hold 05/01/2022 1301)  ondansetron (ZOFRAN) injection 4 mg (4 mg Intravenous Given 04/23/2022 1100)  morphine (PF) 4 MG/ML injection 4 mg (4 mg Intravenous Given 05/20/2022 1102)  sodium chloride 0.9 % bolus 1,000 mL (0 mLs Intravenous Stopped 05/05/2022 1303)  fentaNYL (SUBLIMAZE) injection 50 mcg (50 mcg Intravenous Given 04/28/2022 1209)     IMPRESSION / MDM / ASSESSMENT AND PLAN / ED COURSE  I reviewed the triage vital signs and the nursing notes.                              Differential diagnosis includes, but is not limited to, ***  Patient's presentation is most consistent with {EM COPA:27473}  *** {If the patient is on the monitor, remove the brackets and asterisks on the sentence below and remember to document it as a Procedure as well. Otherwise delete the sentence below:1} {**The patient is on the cardiac monitor to evaluate for evidence of arrhythmia and/or significant heart rate changes.**} {Remember to  include, when applicable, any/all of the following data: independent review of imaging independent review of labs (comment specifically on pertinent positives and negatives) review of specific prior hospitalizations, PCP/specialist notes, etc. discuss meds given and prescribed document any discussion with consultants (including hospitalists) any clinical decision tools you used and why (PECARN, NEXUS, etc.) did you consider admitting the patient? document social determinants of health affecting patient's care (homelessness, inability to follow up in a timely fashion, etc) document any pre-existing conditions increasing risk on current visit (e.g. diabetes and HTN increasing danger of high-risk chest pain/ACS) describes what meds you gave (especially parenteral) and why any other interventions?:1}     FINAL CLINICAL IMPRESSION(S) / ED DIAGNOSES   Final diagnoses:  None     Rx / DC Orders   ED Discharge Orders     None        Note:  This document was prepared using Dragon voice recognition software and may include unintentional dictation errors.

## 2022-04-21 NOTE — Consult Note (Signed)
Reason for Consult:abdominal pain Referring Provider: Kristopher Oppenheim is an 47 y.o. female.  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 She has been having acute abdominal pain that is 10/10. 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    Past Surgical History:  Procedure Laterality Date   BYPASS AXILLA/BRACHIAL ARTERY Right 08/17/2020   Procedure: BYPASS AXILLA/BRACHIAL ARTERY;  Surgeon: Katha Cabal, MD;  Location: [facility];  Service: Vascular;  Laterality: Right;   CESAREAN SECTION     times 4   EMBOLECTOMY Right 03/09/2020   Procedure: Brachial EMBOLECTOMY;  Surgeon: Algernon Huxley, MD;  Location: [facility];  Service: Vascular;  Laterality: Right;   ENDARTERECTOMY Right 08/17/2020   Procedure: ENDARTERECTOMY BRACHIAL;  Surgeon: Katha Cabal, MD;  Location: [facility];  Service: Vascular;  Laterality: Right;   IR CHOLANGIOGRAM EXISTING TUBE  12/13/2021   IR CHOLANGIOGRAM EXISTING TUBE  12/20/2021   IR PERC CHOLECYSTOSTOMY  10/26/2021   THROMBECTOMY BRACHIAL ARTERY Right 08/17/2020   Procedure: THROMBECTOMY BRACHIAL ARTERY;  Surgeon: Katha Cabal, MD;  Location: [facility];  Service: Vascular;  Laterality: Right;   UPPER EXTREMITY ANGIOGRAPHY Right 05/28/2020   Procedure: UPPER EXTREMITY ANGIOGRAPHY;  Surgeon: Algernon Huxley, MD;  Location: [place] [facility];  Service: Cardiovascular;  Laterality: Right;   UPPER EXTREMITY ANGIOGRAPHY Right 08/16/2020   Procedure: UPPER EXTREMITY ANGIOGRAPHY;  Surgeon: Algernon Huxley, MD;  Location: [place] [facility];  Service: Cardiovascular;  Laterality: Right;   VISCERAL ARTERY INTERVENTION N/A 10/24/2021   Procedure: VISCERAL ARTERY INTERVENTION;  Surgeon: Katha Cabal, MD;  Location: [place] [facility];  Service: Cardiovascular;  Laterality: N/A;    Family History  Adopted: Yes  Problem Relation Age of Onset   Hypertension Mother     Social History:  reports that she has been smoking cigarettes. She has a 10.00 pack-year smoking history. She has never used smokeless tobacco. She reports that she does not currently use alcohol after a past usage of about 2.0 standard drinks of alcohol per week. She reports that she does not use drugs.  Allergies: No Known Allergies  Medications: I have reviewed the patient's current medications.  Results for orders placed or performed during the hospital encounter of 05/05/2022 (from the past 48 hour(s))  Type and screen [place]     Status: None (Preliminary result)   Collection Time: 04/27/2022  7:22 PM  Result Value Ref Range   ABO/RH(D) PENDING    Antibody Screen PENDING    Sample Expiration      04/24/2022,2359 Performed at [facility], [place] [address] [place], [place] [address]     CT Angio Abd/Pel W and/or Wo Contrast  Result Date: 05/20/2022 CLINICAL DATA:  47 year old female with history of mesenteric ischemia presenting with abdominal pain. EXAM: CT ANGIOGRAPHY ABDOMEN AND PELVIS WITH CONTRAST AND WITHOUT CONTRAST TECHNIQUE: Multidetector CT imaging of the abdomen and pelvis was performed using the standard protocol during bolus administration of intravenous contrast. Multiplanar reconstructed images and MIPs were obtained and reviewed to evaluate the vascular anatomy. RADIATION DOSE REDUCTION: This exam was performed according to the departmental dose-optimization program which includes  automated exposure control, adjustment of the mA and/or kV according to patient size and/or use of iterative reconstruction technique. CONTRAST:  168m OMNIPAQUE IOHEXOL 350 MG/ML SOLN COMPARISON:  10/25/2021 FINDINGS: VASCULAR Aorta: Again seen is extensive fibrofatty atherosclerotic plaque  throughout the abdominal aorta which is normal in caliber. Interval decreased burden of supra visceral fibrofatty atherosclerotic plaque. There is persistent plaque like material most prominent at the celiac SMA ostia resulting in up to approximately 70% aortic stenosis at this level. Similar appearing infrarenal fibrofatty plaque resulting in approximately 95% stenosis just proximal to the bifurcation where there is a prominent atherosclerotic calcification anteriorly peer Celiac: Chronically occluded with diminutive GDA and proper hepatic arteries. No definite evidence of a splenic artery. SMA: Near complete occlusion of the proximal 1 cm of the superior mesenteric artery. The main SMA reconstitutes and is widely patent until abrupt occlusion about the distal aspect of the main SMA. There is evidence of some peripheral jejunal branches with opacification, however diminutive. Renals: Single bilateral renal arteries are patent without evidence of aneurysm, dissection, vasculitis, fibromuscular dysplasia or significant stenosis. IMA: Chronically occluded. Inflow: Patent without evidence of aneurysm, dissection, vasculitis or significant stenosis. Proximal Outflow: Bilateral common femoral and visualized portions of the superficial and profunda femoral arteries are patent without evidence of aneurysm, dissection, vasculitis or significant stenosis. Veins: No obvious venous abnormality within the limitations of this arterial phase study. Review of the MIP images confirms the above findings. NON-VASCULAR Lower chest: No acute abnormality. Hepatobiliary: Interval development of rounded hypoattenuating mass in the anterior, subcapsular aspect of segment 2 of the liver measuring up to approximately 1.5 cm with associated capsular retraction. Interval resolution of previously visualized hypoattenuating lesion in the anterior right lobe of the liver. The liver is otherwise normal in appearance. The gallbladder is present and  mildly distended without wall thickening or pericholecystic fluid. Mild diffuse dilation of the common bile duct without evidence of obstruction, unchanged. No intrahepatic biliary ductal dilation. Pancreas: Unremarkable. No pancreatic ductal dilatation or surrounding inflammatory changes. Spleen: Similar appearing atrophic, nodular spleen. Adrenals/Urinary Tract: Adrenal glands are unremarkable. Similar appearance of multilobulated bilateral renal cortices with heterogeneous enhancement in keeping with history of prior renal infarctions. No nephrolithiasis or hydronephrosis. Bladder is unremarkable. Stomach/Bowel: Stomach is within normal limits. Appendix appears normal. No evidence of bowel wall thickening, distention, or inflammatory changes. Lymphatic: No abdominopelvic lymphadenopathy. Reproductive: Uterus and bilateral adnexa are unremarkable. Other: No abdominal wall hernia or abnormality. No abdominopelvic ascites. Musculoskeletal: No acute or significant osseous findings. IMPRESSION: 1. 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 2. Interval flush occlusion of the distal main superior mesenteric artery with suggestion of some perfusion of distal jejunal branches. This has radiologic appearance of acute thromboembolism, however there is no evidence of enteric inflammatory changes or pneumatosis suggesting a degree of chronicity. 3. 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 4. Interval development of focal infarction in the subcapsular anterior segment 2 of the liver with associated capsular retraction and resolution of previously visualized right hepatic infarction. DRuthann Cancer MD [facility] Electronically Signed   By: DRuthann CancerM.D.   On: 05/09/2022 19:09    HYBRID OR IMAGING ([facility] ONLY)  Result Date: 05/11/2022 There is no interpretation for this exam.  This order is for images obtained during a surgical procedure.  Please See "Surgeries" Tab for more  information regarding the procedure.    Review of Systems  Cardiovascular:  Positive for claudication.  Gastrointestinal:  Positive for abdominal pain.  All other systems reviewed and are negative.   PE Last menstrual period 04/07/2022. Constitutional: NAD; conversant; no deformities Eyes: Moist conjunctiva; no lid lag; anicteric; PERRL Neck: Trachea midline; no thyromegaly Lungs: Normal respiratory effort; no tactile fremitus CV: RRR; no palpable thrills; no pitting edema GI: Abd tender throughout abdomen; no palpable hepatosplenomegaly MSK: Normal gait; no clubbing/cyanosis Psychiatric: Appropriate affect; alert and oriented x3 Lymphatic: No palpable cervical or axillary lymphadenopathy Skin: No major subcutaneous nodules. 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 She is going to the OR for revascularization -plan for ex lap after revascularization with possible bowel resection  I reviewed last 24 h vitals and pain scores, last 48 h intake and output, last 24 h labs and trends, and last 24 h imaging results.  This care required high  level of medical decision making.   Arta Bruce Keanan Melander 05/13/2022, 7:53 PM

## 2022-04-21 NOTE — Op Note (Signed)
DATE OF SERVICE: 05/08/2022  PATIENT:  Suzanne [name]  47 y.o. 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 KiKieth Brightly SURGEON:  ThYevonne AlineHaStanford BreedMD  ASSISTANT: none  ANESTHESIA:   general  ESTIMATED BLOOD LOSS: minimal  LOCAL MEDICATIONS USED:  NONE  COUNTS: confirmed correct.  PATIENT DISPOSITION:  PACU - hemodynamically stable.   Delay start of Pharmacological VTE agent (>24hrs) due to surgical blood loss or risk of bleeding: no  INDICATION FOR PROCEDURE: StZULEYKA KLOCs a [age].o. female with acute mesenteric ischemia and CT evidence of near occlusion of the superior mesenteric artery. After careful discussion of risks, benefits, and alternatives the patient was offered angiography with intervention and exploratory laparotomy. The patient understood and wished to proceed.  OPERATIVE FINDINGS:  Celiac artery chronically occluded Superior mesenteric artery critically stenosed Good result from angioplasty - near normal flow through SMA and into GDA retrograde.  DESCRIPTION OF PROCEDURE: After identification of the patient in the pre-operative holding area, the patient was transferred to the operating room. The patient was positioned supine on the operating room table. Anesthesia was induced. The groins was prepped and draped in standard fashion. A surgical pause was performed confirming correct patient, procedure, and operative location.  Using ultrasound guidance, the right common femoral artery was accessed with micropuncture technique. Fluoroscopy was used to confirm cannulation over the femoral head. The 2F sheath was upsized to 23F.   A Benson wire was advanced  into the distal aorta. Over the wire an omni flush catheter was advanced to the level of L2. Aortogram was performed in a lateral projection - see above for details.   The decision was made to intervene. The patient was heparinized with 10,000 units of heparin. The 23F sheath was exchanged for a 84F oscor sheath. The SMA was selected with a glidewire advantage. Plain angioplasty was performed with 6x2090mustang angioplasty balloon. Good technical result was achieved.   A perclose device was used to close the arteriotomy. Hemostasis was excellent upon completion.  The case was then turned over to Dr. KinKieth Brightlye performed an exploratory laparotomy which will be dictated separately.   Upon completion of the case instrument and sharps counts were confirmed correct. The patient was transferred to the PACU in good condition. I was present for all portions of the procedure.  ThoYevonne AlineawStanford BreedD [facility] Number: ([id number] 8:38 PM

## 2022-04-21 NOTE — ED Notes (Signed)
Charge RN was notified by a Therapist, sports that was passing by subwait area where pt was laying in the floor. Dareen Piano, RN told charge RN that pt stated that "I'm in so much pain, I'm leaving here to get a shot gun". Charge RN called security to make them aware and patient was spoken to by on duty sheriff.

## 2022-04-21 NOTE — Op Note (Signed)
Preoperative diagnosis: mesenteric ischemia  Postoperative diagnosis: same   Procedure: exploratory laparotomy  Surgeon: Gurney Maxin, M.D.  Asst: Jamelle Haring  Anesthesia: GETA  Indications for procedure: Suzanne [name] is a 47 y.o. 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 She presents for revascularization and then exploration for possible bowel ischemia  Description of procedure: The patient was brought into the operative suite. Anesthesia was administered with General endotracheal anesthesia. WHO checklist was applied. The patient was then placed in supine position. The area was prepped and draped in the usual sterile fashion.  Please see Dr. Mora Appl note for revascularization details.  Next, an upper midline incision was made. Cautery was used to dissect through the subcutaneous tissue and the fascia was entered in the midline. The small intestine was run from ligament of Trietz to ileocecal valve. There was one area that was slightly blue in the distal jejunum but no area of necrosis or concerning ischemia. The transverse colon was inspected and also looked fine. The fascia was closed with 0 PDS in running fashion. The skin was closed with staples. The patient awoke from anesthesia and was brought to PACU in stable condition.  Findings: no bowel necrosis  Specimen: none  Implant: none   Blood loss: 20 ml  Local anesthesia: none  Complications: none  Gurney Maxin, M.D. General, Bariatric, & Minimally Invasive Surgery [facility]

## 2022-04-21 NOTE — Progress Notes (Signed)
See Dr. Stephens Shire note for full H&P details. Patient very

## 2022-04-21 NOTE — Consult Note (Signed)
[facility]  Patient name: Suzanne [name] MRN: [id number] DOB: 07-25-1975 Sex: female   REQUESTING PROVIDER:   ER   REASON FOR CONSULT:    Mesenteric ischemia  HISTORY OF PRESENT ILLNESS:   Suzanne [name] is a 47 y.o. female, who presented to the emergency department with a 2-day history of abdominal pain.  Her lactate was elevated at 3.8.  She has been on anticoagulation for a hypercoagulable disorder, however she has not taken it for 48 hours.  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  She underwent angiography with angioplasty of an occluded celiac artery.  It was felt that she did not have any further options for revascularization of her celiac artery.  The patient has an extensive vascular surgical history.  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  She has been recommended to be on anticoagulation, lifelong by hematology at [facility].  She recently hit her right second toe and has a wound there.  She does complain of claudication at approximately 200 feet.  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     FAMILY HISTORY   Family History  Adopted: Yes  Problem Relation Age of Onset   Hypertension Mother     SOCIAL HISTORY:   Social History   Socioeconomic History   Marital status: Divorced    Spouse name: Not on file   Number of children: Not on file   Years of education: Not on file   Highest education level: Not on file  Occupational History   Not on file  Tobacco Use   Smoking status: Every Day    Packs/day: 0.50    Years: 20.00    Total pack years: 10.00    Types: Cigarettes    Last  attempt to quit: 07/15/2020    Years since quitting: 1.7   Smokeless tobacco: Never  Vaping Use   Vaping Use: Never used  Substance and Sexual Activity   Alcohol use: Not Currently    Alcohol/week: 2.0 standard drinks of alcohol    Types: 2 Cans of beer per week    Comment: 1-2 days a week per patient. husband says more often   Drug use: No   Sexual activity: Not on file  Other Topics Concern   Not on file  Social History Narrative   Not on file   Social Determinants of Health   Financial Resource Strain: Not on file  Food Insecurity: Not on file  Transportation Needs: Not on file  Physical Activity: Not on file  Stress: Not on file  Social Connections: Not on file  Intimate Partner Violence: Not on file    ALLERGIES:    No Known Allergies  CURRENT MEDICATIONS:    Current Facility-Administered Medications  Medication Dose Route Frequency Provider Last Rate Last Admin   heparin ADULT infusion 100 units/mL (25000 units/29m)  1,100 Units/hr Intravenous Continuous MDarrick Penna RPH 11 mL/hr at 04/30/2022 1734 1,100 Units/hr at 05/18/2022 1734   ondansetron (ZOFRAN) injection 4 mg  4 mg Intravenous Once FVersie Starks PA-C       Current Outpatient Medications  Medication Sig Dispense Refill  acetaminophen (TYLENOL) 325 MG tablet Take 2 tablets (650 mg total) by mouth every 6 (six) hours as needed for mild pain, fever or headache. 100 tablet 2   albuterol (PROVENTIL HFA;VENTOLIN HFA) 108 (90 Base) MCG/ACT inhaler Inhale 2 puffs into the lungs every 6 (six) hours as needed for wheezing or shortness of breath. (Patient not taking: Reported on 10/22/2021) 1 Inhaler 2   amLODipine (NORVASC) 5 MG tablet Take 1 tablet (5 mg total) by mouth daily. 30 tablet 2   apixaban (ELIQUIS) 5 MG TABS tablet Take 1 tablet (5 mg total) by mouth 2 (two) times daily. 60 tablet 2   atorvastatin (LIPITOR) 80 MG tablet Take 1 tablet (80 mg total) by mouth daily. 30 tablet 2   clopidogrel (PLAVIX)  75 MG tablet Take 1 tablet (75 mg total) by mouth daily with breakfast. 30 tablet 2   Multiple Vitamin (MULTIVITAMIN WITH MINERALS) TABS tablet Take 1 tablet by mouth daily.      REVIEW OF SYSTEMS:   '[X]'$  denotes positive finding, '[ ]'$  denotes negative finding Cardiac  Comments:  Chest pain or chest pressure:    Shortness of breath upon exertion:    Short of breath when lying flat:    Irregular heart rhythm:        Vascular    Pain in calf, thigh, or hip brought on by ambulation: x   Pain in feet at night that wakes you up from your sleep:     Blood clot in your veins:    Leg swelling:         Pulmonary    Oxygen at home:    Productive cough:     Wheezing:         Neurologic    Sudden weakness in arms or legs:     Sudden numbness in arms or legs:     Sudden onset of difficulty speaking or slurred speech:    Temporary loss of vision in one eye:     Problems with dizziness:         Gastrointestinal    Blood in stool:      Vomited blood:         Genitourinary    Burning when urinating:     Blood in urine:        Psychiatric    Major depression:         Hematologic    Bleeding problems:    Problems with blood clotting too easily:        Skin    Rashes or ulcers:        Constitutional    Fever or chills:     PHYSICAL EXAM:   Vitals:   05/02/2022 1036 05/07/2022 1121 05/06/2022 1418 05/18/2022 1757  BP:   (!) 212/93 (!) 195/104  Pulse:  96 90 99  Resp:  20 18 (!) 22  Temp:  98.6 F (37 C) 98.6 F (37 C) 98.7 F (37.1 C)  TempSrc:      SpO2:  99% 99% 100%  Weight: 63.5 kg     Height: '5\' 7"'$  (1.702 m)       GENERAL: The patient is a well-nourished female, in no acute distress. The vital signs are documented above. CARDIAC: There is a regular rate and rhythm.  VASCULAR: Easily palpable right femoral pulse, faintly palpable left.  Pedal pulses not palpable. PULMONARY: Nonlabored respirations ABDOMEN: Diffusely tender to minimal palpation MUSCULOSKELETAL: There  are no major deformities or cyanosis. NEUROLOGIC:  No focal weakness or paresthesias are detected. PSYCHIATRIC: The patient has a normal affect.  STUDIES:   I have reviewed her CT scan and compared it to her scan 6 months ago.  She has persistent aortic thrombus which is now encroaching on her superior mesenteric artery creating a string sign proximally.  The artery is patent distally.  The celiac artery is occluded at its origin as is the inferior mesenteric artery.  ASSESSMENT and PLAN   Mesenteric ischemia.  I feel that the patient's symptoms are related to an adequate blood flow through her superior mesenteric artery, as there has been progression of her aortic thrombus now creating approximately 90-95% stenosis of the superior mesenteric artery I have recommended that she undergo intervention.  I initially contemplated exploratory laparotomy and open thrombectomy, however since her superior mesenteric artery does have a flow channel, percutaneous intervention could be performed with mechanical thrombectomy and angioplasty/stenting.  I spoke with general surgery at [facility] and they are willing to evaluate her in the operating room if that is the direction she needs to go.  Unfortunately however, there are no ICU beds available for the patient and so she will need to be transferred to [facility] for definitive care.  I spoke with Dr. Stanford Breed who has agreed to take her to the operating room.  Transfer will be initiated.  I discussed with the patient that she may require abdominal exploration to rule out ischemic bowel.  I also stressed the importance of never stopping her anticoagulation.  She will need lower extremity arterial evaluation once her acute abdominal issues are resolved, she does have a second toe ulcer   Leia Alf, MD, FACS [facility] [phone] Pager [phone]

## 2022-04-21 NOTE — ED Notes (Signed)
Pt unable to consent to transfer to [facility]. This is an emergent transfer to [facility], [employer] given report.

## 2022-04-21 NOTE — Transfer of Care (Signed)
Immediate Anesthesia Transfer of Care Note  Patient: Suzanne [name]  Procedure(s) Performed: EXPLORATORY LAPAROTOMY (Abdomen) AORTOGRAM (Right: Groin) BALLOON ANGIOPLASTY OF THE SUPERIOR MESENTERIC ARTERY (Right: Groin)  Patient Location: PACU  Anesthesia Type:General  Level of Consciousness: drowsy  Airway & Oxygen Therapy: Patient Spontanous Breathing and Patient connected to face mask oxygen  Post-op Assessment: Report given to RN and Post -op Vital signs reviewed and stable  Post vital signs: Reviewed and stable  Last Vitals:  Vitals Value Taken Time  BP 178/95 04/24/2022 2104  Temp    Pulse 66 05/08/2022 2113  Resp 19 05/05/2022 2113  SpO2 94 % 05/10/2022 2113  Vitals shown include unvalidated device data.  Last Pain: There were no vitals filed for this visit.       Complications: No notable events documented.

## 2022-04-21 NOTE — ED Notes (Signed)
Lab called with critical Lactic Acid 2.5

## 2022-04-21 NOTE — ED Notes (Signed)
Unable to draw ptt, type and cross x 2. Lab notified x 2. Pharmacy notified, will go ahead and start Heparin drip

## 2022-04-21 NOTE — Progress Notes (Signed)
ANTICOAGULATION CONSULT NOTE - Initial Consult  Pharmacy Consult for heparin Indication:  aortic/sma thrombus  No Known Allergies  Patient Measurements:  Heparin Dosing Weight: 64kg  Vital Signs: Temp: 97 F (36.1 C) (01/01 2104) Temp Source: Oral (01/01 1033) BP: 172/90 (01/01 2115) Pulse Rate: 67 (01/01 2115)  Labs: Recent Labs    04/23/2022 1047  HGB 9.8*  HCT 33.3*  PLT 534*  CREATININE 0.79    Estimated Creatinine Clearance: 86.4 mL/min (by C-G formula based on SCr of 0.79 mg/dL).   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 New orders to start therapeutic heparin infusion for aortic/SMA thrombus. Patient also s/p exploratory laparotomy.   Patient appears to have been on apixaban prior to admit and was recommended to be on lifelong anticoagulation by hematology. Per pharmacists note from [facility] she hasn't had her apixaban for the past 2 days, however her last outpatient fill was a 30 day supply on 02/03/22. Will need to follow aptt and heparin level until they correlate. Will not bolus given recent surgery.   Goal of Therapy:  Heparin level 0.3-0.7 units/ml aPTT 66-102 seconds Monitor platelets by anticoagulation protocol: Yes   Plan:  Start heparin infusion at 1000 units/hr Check anti-Xa level in 6 hours and daily while on heparin Continue to monitor H&H and platelets  Erin Hearing PharmD., BCPS Clinical Pharmacist 05/14/2022 9:40 PM

## 2022-04-21 NOTE — ED Notes (Signed)
Princess RN, from [place], given report

## 2022-04-21 NOTE — ED Provider Triage Note (Signed)
Emergency Medicine Provider Triage Evaluation Note  Suzanne [name] , a 47 y.o. female  was evaluated in triage.  Pt complains of severe abdominal pain.  Patient had her gallbladder drained 3 months ago and had a drain for several weeks.  Is having severe pain today.  Patient is actually lying on the floor and some white.  Review of Systems  Positive:  Negative:   Physical Exam  Pulse 96   Temp 98.6 F (37 C)   Resp 20   Ht '5\' 7"'$  (1.702 m)   Wt 63.5 kg   LMP 04/07/2022   SpO2 99%   BMI 21.93 kg/m  Gen:   Awake, no distress, patient is in a great deal of pain Resp:  Normal effort  MSK:   Moves extremities without difficulty  Other:    Medical Decision Making  Medically screening exam initiated at 12:13 PM.  Appropriate orders placed.  Suzanne [name] was informed that the remainder of the evaluation will be completed by another provider, this initial triage assessment does not replace that evaluation, and the importance of remaining in the ED until their evaluation is complete.  Patient was given additional pain medication, fentanyl 50 mcg IV along with Zofran 4 mg IV, went ahead and ordered CT abdomen pelvis with concerns of peritonitis   Versie Starks, PA-C 05/05/2022 1214

## 2022-04-21 NOTE — Progress Notes (Signed)
See Dr. Stephens Shire note for full H&P details. 46YF with history of aortic thrombus, celiac occlusion. Very tender on exam. No frank peritonitis. Can cough with some abdominal pain. Plan OR emergently. Aortogram, SMA angioplasty. Further revascularization if angioplasty not effective. Exploratory laparotomy with Dr. Kieth Brightly.   Suzanne [name]. Suzanne Breed, MD [facility] Phone Number: [phone] 05/11/2022 6:58 PM

## 2022-04-21 NOTE — Anesthesia Procedure Notes (Signed)
Procedure Name: Intubation Date/Time: 04/27/2022 7:14 PM  Performed by: Reece Agar, CRNAPre-anesthesia Checklist: Patient identified, Emergency Drugs available, Suction available and Patient being monitored Patient Re-evaluated:Patient Re-evaluated prior to induction Oxygen Delivery Method: Circle System Utilized Preoxygenation: Pre-oxygenation with 100% oxygen Induction Type: IV induction, Rapid sequence and Cricoid Pressure applied Laryngoscope Size: Mac and 3 Grade View: Grade I Tube type: Oral Tube size: 7.0 mm Number of attempts: 1 Airway Equipment and Method: Stylet Placement Confirmation: ETT inserted through vocal cords under direct vision, positive ETCO2 and breath sounds checked- equal and bilateral Secured at: 21 cm Tube secured with: Tape Dental Injury: Teeth and Oropharynx as per pre-operative assessment

## 2022-04-21 NOTE — Consult Note (Signed)
ANTICOAGULATION CONSULT NOTE - Initial Consult  Pharmacy Consult for heparin infusion Indication:  VTE Treatment  No Known Allergies  Patient Measurements: Height: '5\' 7"'$  (170.2 cm) Weight: 63.5 kg (140 lb) IBW/kg (Calculated) : 61.6 Heparin Dosing Weight: 63.5 kg  Vital Signs: Temp: 98.6 F (37 C) (01/01 1418) Temp Source: Oral (01/01 1033) BP: 212/93 (01/01 1418) Pulse Rate: 90 (01/01 1418)  Labs: Recent Labs    04/23/2022 1047  HGB 9.8*  HCT 33.3*  PLT 534*  CREATININE 0.79    Estimated Creatinine Clearance: 86.4 mL/min (by C-G formula based on SCr of 0.79 mg/dL).   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 Per ED triage notes, patient had gallbladder surgery 3 months ago. Patient reports having vaginal bleeding today (2 weeks s/p last period). Results from CT Angio abdomen/pelvis are currently pending. Pharmacy consulted for management of heparin infusion in the setting of VTE.   Date Time aPTT/HL Rate/Comment   Goal of Therapy:  Heparin level 0.3-0.7 units/ml aPTT 66-102 seconds Monitor platelets by anticoagulation protocol: Yes   Plan:  Patient on PTA DOAC. Order STAT baseline aPTT and heparin level Give 4500 units bolus x1; then start heparin infusion at 1100 units/hr Check aPTT/Anti-Xa level in 6 hours and daily once consecutively therapeutic.  Titrate by aPTT's until lab correlation is noted, then titrate by anti-xa alone. Continue to monitor H&H and platelets daily while on heparin gtt.  [facility] Pharmacist 05/16/2022 3:30 PM

## 2022-04-21 NOTE — ED Triage Notes (Addendum)
Patient c/o mid abdominal pain for two days. Patient c/o vomiting for two days. Patient had her gall bladder surgery 3 months ago. Patient is tearful, restless in triage. Patient report a normal BM today. Patient states she hasn't had her blood thinners in two days. Patient states that she has vaginal bleeding today two weeks after last period. Patient states she had similar episode in the summer.

## 2022-04-21 NOTE — ED Notes (Signed)
Patient was unable to tolerate B/P cuff and was too restless for EKG. Rainbow was drawn and sent to lab.

## 2022-04-21 NOTE — Anesthesia Preprocedure Evaluation (Signed)
Anesthesia Evaluation  Patient identified by MRN, date of birth, ID band Patient awake    Reviewed: Allergy & Precautions, NPO status , Patient's Chart, lab work & pertinent test results  History of Anesthesia Complications Negative for: history of anesthetic complications  Airway Mallampati: II  TM Distance: >3 FB Neck ROM: Full    Dental  (+) Dental Advisory Given   Pulmonary COPD, Current SmokerPatient did not abstain from smoking.   breath sounds clear to auscultation       Cardiovascular hypertension, Pt. on medications + Peripheral Vascular Disease   Rhythm:Regular     Neuro/Psych  Neuromuscular disease  negative psych ROS   GI/Hepatic Neg liver ROS,GERD  ,,Mesenteric ischemia   Endo/Other  negative endocrine ROS    Renal/GU negative Renal ROSLab Results      Component                Value               Date                      CREATININE               0.79                05/09/2022                Musculoskeletal negative musculoskeletal ROS (+)    Abdominal   Peds  Hematology  (+) Blood dyscrasia, anemia Lab Results      Component                Value               Date                      WBC                      19.4 (H)            05/07/2022                HGB                      9.8 (L)             04/24/2022                HCT                      33.3 (L)            05/18/2022                MCV                      70.6 (L)            05/02/2022                PLT                      534 (H)             05/04/2022              Anesthesia Other Findings   Reproductive/Obstetrics  Anesthesia Physical Anesthesia Plan  ASA: 4 and emergent  Anesthesia Plan: General   Post-op Pain Management: Ofirmev IV (intra-op)*   Induction: Intravenous and Rapid sequence  PONV Risk Score and Plan: 2 and Ondansetron and Dexamethasone  Airway  Management Planned: Oral ETT  Additional Equipment: Arterial line, CVP and Ultrasound Guidance Line Placement  Intra-op Plan:   Post-operative Plan: Possible Post-op intubation/ventilation  Informed Consent: I have reviewed the patients History and Physical, chart, labs and discussed the procedure including the risks, benefits and alternatives for the proposed anesthesia with the patient or authorized representative who has indicated his/her understanding and acceptance.     Dental advisory given  Plan Discussed with: CRNA  Anesthesia Plan Comments:        Anesthesia Quick Evaluation

## 2022-04-21 NOTE — ED Notes (Signed)
Dr Archie Balboa notified 3.8 lactic acid

## 2022-04-22 ENCOUNTER — Inpatient Hospital Stay (HOSPITAL_COMMUNITY): Payer: Medicaid Other

## 2022-04-22 ENCOUNTER — Encounter (HOSPITAL_COMMUNITY): Payer: Self-pay | Admitting: Vascular Surgery

## 2022-04-22 LAB — BASIC METABOLIC PANEL
Anion gap: 9 (ref 5–15)
BUN: 16 mg/dL (ref 6–20)
CO2: 19 mmol/L — ABNORMAL LOW (ref 22–32)
Calcium: 8 mg/dL — ABNORMAL LOW (ref 8.9–10.3)
Chloride: 101 mmol/L (ref 98–111)
Creatinine, Ser: 1.01 mg/dL — ABNORMAL HIGH (ref 0.44–1.00)
GFR, Estimated: >60 mL/min (ref >60)
Glucose, Bld: 124 mg/dL — ABNORMAL HIGH (ref 70–99)
Potassium: 4 mmol/L (ref 3.5–5.1)
Sodium: 129 mmol/L — ABNORMAL LOW (ref 135–145)

## 2022-04-22 LAB — CBC
HCT: 32 % — ABNORMAL LOW (ref 36.0–46.0)
HCT: 28.5 % — ABNORMAL LOW (ref 36.0–46.0)
Hemoglobin: 9.2 g/dL — ABNORMAL LOW (ref 12.0–15.0)
Hemoglobin: 8.3 g/dL — ABNORMAL LOW (ref 12.0–15.0)
MCH: 20.8 pg — ABNORMAL LOW (ref 26.0–34.0)
MCH: 21.3 pg — ABNORMAL LOW (ref 26.0–34.0)
MCHC: 28.8 g/dL — ABNORMAL LOW (ref 30.0–36.0)
MCHC: 29.1 g/dL — ABNORMAL LOW (ref 30.0–36.0)
MCV: 72.4 fL — ABNORMAL LOW (ref 80.0–100.0)
MCV: 73.3 fL — ABNORMAL LOW (ref 80.0–100.0)
Platelets: 468 10*3/uL — ABNORMAL HIGH (ref 150–400)
Platelets: 445 10*3/uL — ABNORMAL HIGH (ref 150–400)
RBC: 4.42 MIL/uL (ref 3.87–5.11)
RBC: 3.89 MIL/uL (ref 3.87–5.11)
RDW: 22.5 % — ABNORMAL HIGH (ref 11.5–15.5)
RDW: 22.9 % — ABNORMAL HIGH (ref 11.5–15.5)
WBC: 36.4 10*3/uL — ABNORMAL HIGH (ref 4.0–10.5)
WBC: 39.6 10*3/uL — ABNORMAL HIGH (ref 4.0–10.5)
nRBC: 0.1 % (ref 0.0–0.2)
nRBC: 0.1 % (ref 0.0–0.2)

## 2022-04-22 LAB — COMPREHENSIVE METABOLIC PANEL
ALT: 10 U/L (ref 0–44)
AST: 22 U/L (ref 15–41)
Albumin: 3 g/dL — ABNORMAL LOW (ref 3.5–5.0)
Alkaline Phosphatase: 74 U/L (ref 38–126)
Anion gap: 9 (ref 5–15)
BUN: 8 mg/dL (ref 6–20)
CO2: 22 mmol/L (ref 22–32)
Calcium: 8.4 mg/dL — ABNORMAL LOW (ref 8.9–10.3)
Chloride: 102 mmol/L (ref 98–111)
Creatinine, Ser: 0.84 mg/dL (ref 0.44–1.00)
GFR, Estimated: >60 mL/min (ref >60)
Glucose, Bld: 146 mg/dL — ABNORMAL HIGH (ref 70–99)
Potassium: 3.3 mmol/L — ABNORMAL LOW (ref 3.5–5.1)
Sodium: 133 mmol/L — ABNORMAL LOW (ref 135–145)
Total Bilirubin: 0.5 mg/dL (ref 0.3–1.2)
Total Protein: 7 g/dL (ref 6.5–8.1)

## 2022-04-22 LAB — HEPARIN LEVEL (UNFRACTIONATED): Heparin Unfractionated: 1.01 IU/mL — ABNORMAL HIGH (ref 0.30–0.70)

## 2022-04-22 LAB — APTT
aPTT: 113 seconds — ABNORMAL HIGH (ref 24–36)
aPTT: 65 seconds — ABNORMAL HIGH (ref 24–36)

## 2022-04-22 LAB — LACTIC ACID, PLASMA
Lactic Acid, Venous: 1.5 mmol/L (ref 0.5–1.9)
Lactic Acid, Venous: 2.8 mmol/L (ref 0.5–1.9)

## 2022-04-22 LAB — MRSA NEXT GEN BY PCR, NASAL: MRSA by PCR Next Gen: NOT DETECTED

## 2022-04-22 LAB — MAGNESIUM: Magnesium: 1.4 mg/dL — ABNORMAL LOW (ref 1.7–2.4)

## 2022-04-22 LAB — PROTIME-INR
INR: 1.3 — ABNORMAL HIGH (ref 0.8–1.2)
Prothrombin Time: 15.9 seconds — ABNORMAL HIGH (ref 11.4–15.2)

## 2022-04-22 MED ORDER — DIPHENHYDRAMINE HCL 50 MG/ML IJ SOLN
12.5000 mg | Freq: Four times a day (QID) | INTRAMUSCULAR | Status: DC | PRN
  Administered 2022-04-22: 12.5 mg via INTRAVENOUS
  Filled 2022-04-22: qty 1

## 2022-04-22 MED ORDER — MORPHINE SULFATE 1 MG/ML IV SOLN PCA
INTRAVENOUS | Status: DC
  Administered 2022-04-23: 6 mg via INTRAVENOUS
  Administered 2022-04-23: 7 mg via INTRAVENOUS
  Administered 2022-04-23: 3 mg via INTRAVENOUS
  Administered 2022-04-23: 5 mg via INTRAVENOUS
  Administered 2022-04-24: 14.8 mg via INTRAVENOUS
  Administered 2022-04-24: 9 mg via INTRAVENOUS
  Administered 2022-04-24: 1 mg via INTRAVENOUS
  Administered 2022-04-24: 3.6 mg via INTRAVENOUS
  Administered 2022-04-24: 0.1 mg via INTRAVENOUS
  Administered 2022-04-24 – 2022-04-25 (×2): 3 mg via INTRAVENOUS
  Administered 2022-04-25: 8.19 mg via INTRAVENOUS
  Administered 2022-04-25: 5 mg via INTRAVENOUS
  Administered 2022-04-25: 4 mg via INTRAVENOUS
  Filled 2022-04-22 (×9): qty 30

## 2022-04-22 MED ORDER — ONDANSETRON HCL 4 MG/2ML IJ SOLN
4.0000 mg | Freq: Four times a day (QID) | INTRAMUSCULAR | Status: DC | PRN
  Administered 2022-04-23: 4 mg via INTRAVENOUS
  Filled 2022-04-22: qty 2

## 2022-04-22 MED ORDER — SODIUM CHLORIDE 0.9% FLUSH: 9.0000 mL | INTRAVENOUS | Status: DC | PRN

## 2022-04-22 MED ORDER — NALOXONE HCL 0.4 MG/ML IJ SOLN: 0.4000 mg | INTRAMUSCULAR | Status: DC | PRN

## 2022-04-22 MED ORDER — SODIUM CHLORIDE 0.9 % IV SOLN: INTRAVENOUS | Status: DC | PRN

## 2022-04-22 MED ORDER — METHOCARBAMOL 1000 MG/10ML IJ SOLN
500.0000 mg | Freq: Four times a day (QID) | INTRAVENOUS | Status: DC
  Administered 2022-04-22 – 2022-04-25 (×12): 500 mg via INTRAVENOUS
  Filled 2022-04-22 (×3): qty 500
  Filled 2022-04-22: qty 5
  Filled 2022-04-22 (×3): qty 500
  Filled 2022-04-22: qty 5
  Filled 2022-04-22: qty 500
  Filled 2022-04-22 (×2): qty 5
  Filled 2022-04-22: qty 500
  Filled 2022-04-22 (×3): qty 5
  Filled 2022-04-22: qty 500
  Filled 2022-04-22 (×2): qty 5
  Filled 2022-04-22: qty 500
  Filled 2022-04-22: qty 5

## 2022-04-22 MED ORDER — MAGNESIUM SULFATE 4 GM/100ML IV SOLN
4.0000 g | Freq: Once | INTRAVENOUS | Status: AC
  Administered 2022-04-22: 4 g via INTRAVENOUS
  Filled 2022-04-22: qty 100

## 2022-04-22 MED ORDER — ACETAMINOPHEN 10 MG/ML IV SOLN
1000.0000 mg | Freq: Four times a day (QID) | INTRAVENOUS | Status: AC
  Administered 2022-04-22 – 2022-04-23 (×4): 1000 mg via INTRAVENOUS
  Filled 2022-04-22 (×4): qty 100

## 2022-04-22 MED ORDER — POTASSIUM CHLORIDE 10 MEQ/100ML IV SOLN
10.0000 meq | INTRAVENOUS | Status: AC
  Administered 2022-04-22 (×4): 10 meq via INTRAVENOUS
  Filled 2022-04-22 (×4): qty 100

## 2022-04-22 MED ORDER — DIPHENHYDRAMINE HCL 12.5 MG/5ML PO ELIX
12.5000 mg | ORAL_SOLUTION | Freq: Four times a day (QID) | ORAL | Status: DC | PRN
  Filled 2022-04-22: qty 5

## 2022-04-22 NOTE — Progress Notes (Signed)
[place] for heparin Indication:  aortic/SMA thrombus Brief A/P: aPTT supratherapeutic Continue Heparin at current rate for now  No Known Allergies  Patient Measurements: Weight: 62.4 kg (137 lb 9.1 oz)  Vital Signs: Temp: 97.9 F (36.6 C) (01/02 0324) Temp Source: Axillary (01/02 0324) BP: 163/91 (01/02 0500) Pulse Rate: 71 (01/02 0515)  Labs: Recent Labs    05/21/2022 1047 04/24/2022 2206 05/03/2022 2302 05/17/2022 2311 04/22/22 0406  HGB 9.8* 9.3*  --  12.2 9.2*  HCT 33.3* 30.9*  --  36.0 32.0*  PLT 534* 423*  --   --  468*  APTT  --   --   --   --  113*  LABPROT  --   --   --   --  15.9*  INR  --   --   --   --  1.3*  HEPARINUNFRC  --   --   --   --  1.01*  CREATININE 0.79  --  0.80  --   --      Estimated Creatinine Clearance: 86.4 mL/min (by C-G formula based on SCr of 0.8 mg/dL).  Assessment: 47 year old female withSMA thrombus s/p angioplasty and exploratory laparotomy for heparin.   Received heparin 10,000 units IV in OR ~ 8 pm  Goal of Therapy:  Heparin level 0.3-0.7 units/ml aPTT 66-102 seconds Monitor platelets by anticoagulation protocol: Yes   Plan:  Continue Heparin at current rate for now Recheck aPTT in 8 hours and adjust rate at that time if needed  Phillis Knack, PharmD, BCPS  04/22/2022 5:15 AM

## 2022-04-22 NOTE — Progress Notes (Signed)
ANTICOAGULATION CONSULT NOTE Pharmacy Consult for heparin Indication:  aortic/SMA thrombus   No Known Allergies  Patient Measurements: Weight: 62.4 kg (137 lb 9.1 oz)  Vital Signs: Temp: 98.2 F (36.8 C) (01/02 0900) Temp Source: Oral (01/02 0900) BP: 152/92 (01/02 1430) Pulse Rate: 92 (01/02 1430)  Labs: Recent Labs    05/07/2022 1047 05/18/2022 2206 05/03/2022 2302 05/10/2022 2311 04/22/22 0406 04/22/22 1350  HGB 9.8* 9.3*  --  12.2 9.2*  --   HCT 33.3* 30.9*  --  36.0 32.0*  --   PLT 534* 423*  --   --  468*  --   APTT  --   --   --   --  113* 65*  LABPROT  --   --   --   --  15.9*  --   INR  --   --   --   --  1.3*  --   HEPARINUNFRC  --   --   --   --  1.01*  --   CREATININE 0.79  --  0.80  --  0.84  --      Estimated Creatinine Clearance: 82.2 mL/min (by C-G formula based on SCr of 0.84 mg/dL).  Assessment: 47 year old female withSMA thrombus s/p angioplasty and exploratory laparotomy on heparin. She is on apixaban PTA for ahypercoagulable disorder (lifelong anticoagulation by hematology at [facility])  -aPTT= 65 on heparin 1000 units/hr -Hg 9.2 (9.3 on 1/1)  Goal of Therapy:  Heparin level 0.3-0.7 units/ml aPTT 66-102 seconds Monitor platelets by anticoagulation protocol: Yes   Plan:  -Increase heparin to 1050 units/hr -Daily heparin level, aPTT and CBC  Hildred Laser, PharmD Clinical Pharmacist **Pharmacist phone directory can now be found on [email or web address] ([email or web address]).  Listed under [place].

## 2022-04-22 NOTE — Progress Notes (Addendum)
  Progress Note    04/22/2022 7:56 AM 1 Day Post-Op  Subjective:  abd pain this morning   Vitals:   04/22/22 0645 04/22/22 0700  BP:  (!) 166/90  Pulse: 75 76  Resp: 17 20  Temp:    SpO2: 100% 100%   Physical Exam: Lungs:  non labored Incisions:  R groin cath site without hematoma Extremities:  feet symmetrically warm Abdomen:  diffusely tender Neurologic: A&O  CBC    Component Value Date/Time   WBC 36.4 (H) 04/22/2022 0406   RBC 4.42 04/22/2022 0406   HGB 9.2 (L) 04/22/2022 0406   HGB 17.1 (H) 04/23/2011 1538   HCT 32.0 (L) 04/22/2022 0406   HCT 50.5 (H) 04/23/2011 1538   PLT 468 (H) 04/22/2022 0406   PLT 419 04/23/2011 1538   MCV 72.4 (L) 04/22/2022 0406   MCV 98 04/23/2011 1538   MCH 20.8 (L) 04/22/2022 0406   MCHC 28.8 (L) 04/22/2022 0406   RDW 22.5 (H) 04/22/2022 0406   RDW 14.9 (H) 04/23/2011 1538   LYMPHSABS 2.2 10/24/2021 0426   MONOABS 2.1 (H) 10/24/2021 0426   EOSABS 0.1 10/24/2021 0426   BASOSABS 0.1 10/24/2021 0426    BMET    Component Value Date/Time   NA 133 (L) 04/22/2022 0406   NA 140 04/23/2011 1538   K 3.3 (L) 04/22/2022 0406   K 3.9 04/23/2011 1538   CL 102 04/22/2022 0406   CL 105 04/23/2011 1538   CO2 22 04/22/2022 0406   CO2 25 04/23/2011 1538   GLUCOSE 146 (H) 04/22/2022 0406   GLUCOSE 117 (H) 04/23/2011 1538   BUN 8 04/22/2022 0406   BUN 2 (L) 04/23/2011 1538   CREATININE 0.84 04/22/2022 0406   CREATININE 0.63 04/23/2011 1538   CALCIUM 8.4 (L) 04/22/2022 0406   CALCIUM 8.9 04/23/2011 1538   GFRNONAA >60 04/22/2022 0406   GFRNONAA >60 04/23/2011 1538   GFRAA >60 12/22/2018 0839   GFRAA >60 04/23/2011 1538    INR    Component Value Date/Time   INR 1.3 (H) 04/22/2022 0406     Intake/Output Summary (Last 24 hours) at 04/22/2022 0756 Last data filed at 04/22/2022 0700 Gross per 24 hour  Intake 1540.83 ml  Output 1910 ml  Net -369.17 ml     Assessment/Plan:  47 y.o. female is s/p SMA angioplasty and exploratory  laparotomy 1 Day Post-Op   R groin cath site without hematoma; feet warm with motor and sensation intact Continue IV heparin Lactate normal; WBC up to 36; continue to follow abd exam per general surgery; sips and ice chips ok   Dagoberto Ligas, PA-C [facility] [phone] 04/22/2022 7:56 AM  VASCULAR STAFF ADDENDUM: I have independently interviewed and examined the patient. I agree with the above.  Good technical result from SMA angioplasty yesterday. No dead or threatened bowel at exploratory laparotomy yesterday. Patient with continued severe pain this morning. No peritoneal signs on exam. She is tender throughout. No rebound. WBC significantly elevated to 36k. Lactate has normalized. Unclear why she is having so much pain after surgery ? Reperfusion injury vs. Ongoing ischemia from small vessel occlusion. Will await general surgery evaluation and opinion. Continue to monitor closely. Start PCA for pain. Check labs again this evening.  Yevonne Aline. Stanford Breed, MD [facility] Phone Number: [phone] 04/22/2022 8:18 AM

## 2022-04-22 NOTE — Plan of Care (Signed)

## 2022-04-22 NOTE — Progress Notes (Signed)
OT Cancellation Note  Patient Details Name: Suzanne [name] MRN: [id number] DOB: 11-10-1975   Cancelled Treatment:    Reason Eval/Treat Not Completed: Medical issues which prohibited therapy (discussed with RN who requested hold due to pt fem site bleeding)  Elliot Cousin 04/22/2022, 9:43 AM

## 2022-04-22 NOTE — TOC Initial Note (Signed)
Transition of Care [facility]) - Initial/Assessment Note    Patient Details  Name: Suzanne [name] MRN: [id number] Date of Birth: 1976/01/17  [facility] CM/SW Contact:    Erenest Rasher, RN Phone Number: [phone] 04/22/2022, 11:15 AM  Clinical Narrative:                  TOC CM attempted to arouse pt, she wanted to rest. Reports living with a friend. Will continue to follow for PCP, and SDOH.     Expected Discharge Plan: Home/Self Care Barriers to Discharge: Continued Medical Work up   Patient Goals and CMS Choice Patient states their goals for this hospitalization and ongoing recovery are:: wants to feel better CMS Medicare.gov Compare Post Acute Care list provided to:: Patient        Expected Discharge Plan and Services   Discharge Planning Services: CM Consult   Living arrangements for the past 2 months: Mobile Home                                      Prior Living Arrangements/Services Living arrangements for the past 2 months: Mobile Home Lives with:: Friends Patient language and need for interpreter reviewed:: Yes        Need for Family Participation in Patient Care: No (Comment) Care giver support system in place?: Yes (comment)   Criminal Activity/Legal Involvement Pertinent to Current Situation/Hospitalization: No - Comment as needed  Activities of Daily Living      Permission Sought/Granted Permission sought to share information with : Case Manager, Family Supports Permission granted to share information with : Yes, Verbal Permission Granted  Share Information with NAME: Tama Gander     Permission granted to share info w Relationship: friend  Permission granted to share info w Contact Information: [phone]  Emotional Assessment Appearance:: Appears stated age Attitude/Demeanor/Rapport: Lethargic   Orientation: : Oriented to Self, Oriented to Place, Oriented to  Time, Oriented to Situation   Psych Involvement:  No (comment)  Admission diagnosis:  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] Acute mesenteric ischemia ([place]) [K55.059] Patient Active Problem List   Diagnosis Date Noted   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   Acute mesenteric ischemia ([place]) 05/09/2022   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   NSVT (nonsustained ventricular tachycardia) ([place]) 10/26/2021   Hypokalemia 10/24/2021   Abdominal pain 10/23/2021   PVD (peripheral vascular disease) ([place]) 10/23/2021   Hypercoagulable state ([place]) 10/23/2021   GERD (gastroesophageal reflux disease) 10/23/2021   Chronic pain 10/23/2021   COPD (chronic obstructive pulmonary disease) ([place]) 10/23/2021   Tobacco use 10/23/2021   Alcohol abuse 10/23/2021   Leg pain, right 10/23/2021   Essential hypertension 10/23/2021   Mesenteric ischemia ([place]) 10/22/2021   Ischemia of extremity 08/17/2020   Sprain of right wrist 01/16/2020   DVT (deep venous thrombosis) ([place]) 12/21/2018   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   Hyperlipidemia 12/18/2017   Chest pain 01/22/2016   Tobacco dependence 01/22/2016   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   Aortic thrombus ([place]) 01/15/2015   PCP:  System, Provider Not In Pharmacy:   [facility] [place] [place] [address] Phone: [phone] Fax: [id number] [address], [address]  [place] [address] Phone: [phone] Fax: [place] [address] Phone: [phone] Fax: [phone]     Social Determinants of Health (SDOH) Social History: SDOH Screenings   Tobacco Use: High Risk (04/22/2022)   SDOH Interventions:     Readmission Risk Interventions     No data to display

## 2022-04-22 NOTE — Progress Notes (Signed)
[facility] Progress Note  1 Day Post-Op  Subjective: CC-  Having a lot of abdominal pain this morning. No flatus or BM. Denies n/v. Lactic acid normalized. WBC up some POD#1 as expected. BP elevated otherwise VSS.  Objective: Vital signs in last 24 hours: Temp:  [97 F (36.1 C)-98.7 F (37.1 C)] 97.9 F (36.6 C) (01/02 0324) Pulse Rate:  [66-99] 79 (01/02 0800) Resp:  [11-23] 19 (01/02 0800) BP: (140-212)/(64-106) 144/86 (01/02 0800) SpO2:  [91 %-100 %] 94 % (01/02 0800) Arterial Line BP: (131-213)/(63-111) 179/101 (01/02 0800) Weight:  [62.4 kg-63.5 kg] 62.4 kg (01/02 0500)    Intake/Output from previous day: 01/01 0701 - 01/02 0700 In: 1540.8 [I.V.:1440.8; IV Piggyback:100] Out: 1910 [Urine:1860; Blood:50] Intake/Output this shift: Total I/O In: 10 [I.V.:10] Out: 150 [Urine:150]  PE: Gen:  Alert, NAD but uncomfortable Card:  RRR Pulm:  rate and effort normal Abd: mild distension but soft, diffuse tenderness but no peritonitis, cdi dressing to midline incision  Lab Results:  Recent Labs    04/24/2022 2206 04/24/2022 2311 04/22/22 0406  WBC 24.5*  --  36.4*  HGB 9.3* 12.2 9.2*  HCT 30.9* 36.0 32.0*  PLT 423*  --  468*   BMET Recent Labs    04/29/2022 2302 04/30/2022 2311 04/22/22 0406  NA 132* 135 133*  K 3.3* 3.5 3.3*  CL 100  --  102  CO2 21*  --  22  GLUCOSE 211*  --  146*  BUN 6  --  8  CREATININE 0.80  --  0.84  CALCIUM 8.0*  --  8.4*   PT/INR Recent Labs    04/22/22 0406  LABPROT 15.9*  INR 1.3*   CMP     Component Value Date/Time   NA 133 (L) 04/22/2022 0406   NA 140 04/23/2011 1538   K 3.3 (L) 04/22/2022 0406   K 3.9 04/23/2011 1538   CL 102 04/22/2022 0406   CL 105 04/23/2011 1538   CO2 22 04/22/2022 0406   CO2 25 04/23/2011 1538   GLUCOSE 146 (H) 04/22/2022 0406   GLUCOSE 117 (H) 04/23/2011 1538   BUN 8 04/22/2022 0406   BUN 2 (L) 04/23/2011 1538   CREATININE 0.84 04/22/2022 0406   CREATININE 0.63 04/23/2011 1538    CALCIUM 8.4 (L) 04/22/2022 0406   CALCIUM 8.9 04/23/2011 1538   PROT 7.0 04/22/2022 0406   PROT 7.9 04/23/2011 1538   ALBUMIN 3.0 (L) 04/22/2022 0406   ALBUMIN 3.5 04/23/2011 1538   AST 22 04/22/2022 0406   AST 21 04/23/2011 1538   ALT 10 04/22/2022 0406   ALT 17 04/23/2011 1538   ALKPHOS 74 04/22/2022 0406   ALKPHOS 73 04/23/2011 1538   BILITOT 0.5 04/22/2022 0406   BILITOT 0.4 04/23/2011 1538   GFRNONAA >60 04/22/2022 0406   GFRNONAA >60 04/23/2011 1538   GFRAA >60 12/22/2018 0839   GFRAA >60 04/23/2011 1538   Lipase     Component Value Date/Time   LIPASE 31 05/12/2022 1047   LIPASE 98 04/23/2011 1538       Studies/Results: CT Angio Abd/Pel W and/or Wo Contrast  Result Date: 05/06/2022 CLINICAL DATA:  47 year old female with history of mesenteric ischemia presenting with abdominal pain. EXAM: CT ANGIOGRAPHY ABDOMEN AND PELVIS WITH CONTRAST AND WITHOUT CONTRAST TECHNIQUE: Multidetector CT imaging of the abdomen and pelvis was performed using the standard protocol during bolus administration of intravenous contrast. Multiplanar reconstructed images and MIPs were obtained and reviewed to evaluate the vascular  anatomy. RADIATION DOSE REDUCTION: This exam was performed according to the departmental dose-optimization program which includes automated exposure control, adjustment of the mA and/or kV according to patient size and/or use of iterative reconstruction technique. CONTRAST:  166m OMNIPAQUE IOHEXOL 350 MG/ML SOLN COMPARISON:  10/25/2021 FINDINGS: VASCULAR Aorta: Again seen is extensive fibrofatty atherosclerotic plaque throughout the abdominal aorta which is normal in caliber. Interval decreased burden of supra visceral fibrofatty atherosclerotic plaque. There is persistent plaque like material most prominent at the celiac SMA ostia resulting in up to approximately 70% aortic stenosis at this level. 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 No definite evidence of a splenic artery. SMA: Near complete occlusion of the proximal 1 cm of the superior mesenteric artery. The main SMA reconstitutes and is widely patent until abrupt occlusion about the distal aspect of the main SMA. There is evidence of some peripheral jejunal branches with opacification, however diminutive. Renals: Single bilateral renal arteries are patent without evidence of aneurysm, dissection, vasculitis, fibromuscular dysplasia or significant stenosis. IMA: Chronically occluded. Inflow: Patent without evidence of aneurysm, dissection, vasculitis or significant stenosis. Proximal Outflow: Bilateral common femoral and visualized portions of the superficial and profunda femoral arteries are patent without evidence of aneurysm, dissection, vasculitis or significant stenosis. Veins: No obvious venous abnormality within the limitations of this arterial phase study. Review of the MIP images confirms the above findings. NON-VASCULAR Lower chest: No acute abnormality. Hepatobiliary: Interval development of rounded hypoattenuating mass in the anterior, subcapsular aspect of segment 2 of the liver measuring up to approximately 1.5 cm with associated capsular retraction. Interval resolution of previously visualized hypoattenuating lesion in the anterior right lobe of the liver. The liver is otherwise normal in appearance. The gallbladder is present and mildly distended without wall thickening or pericholecystic fluid. Mild diffuse dilation of the common bile duct without evidence of obstruction, unchanged. No intrahepatic biliary ductal dilation. Pancreas: Unremarkable. No pancreatic ductal dilatation or surrounding inflammatory changes. Spleen: Similar appearing atrophic, nodular spleen. Adrenals/Urinary  Tract: Adrenal glands are unremarkable. Similar appearance of multilobulated bilateral renal cortices with heterogeneous enhancement in keeping with history of prior renal infarctions. No nephrolithiasis or hydronephrosis. Bladder is unremarkable. Stomach/Bowel: Stomach is within normal limits. Appendix appears normal. No evidence of bowel wall thickening, distention, or inflammatory changes. Lymphatic: No abdominopelvic lymphadenopathy. Reproductive: Uterus and bilateral adnexa are unremarkable. Other: No abdominal wall hernia or abnormality. No abdominopelvic ascites. Musculoskeletal: No acute or significant osseous findings. IMPRESSION: 1. 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 2. Interval flush occlusion of the distal main superior mesenteric artery with suggestion of some perfusion of distal jejunal branches. This has radiologic appearance of acute thromboembolism, however there is no evidence of enteric inflammatory changes or pneumatosis suggesting a degree of chronicity. 3. 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 4. Interval development of focal infarction in the subcapsular anterior segment 2 of the liver with associated capsular retraction and resolution of previously visualized right hepatic infarction. DRuthann Cancer MD [facility] Electronically Signed   By: DRuthann CancerM.D.   On: 05/05/2022 19:09   HYBRID OR IMAGING ([facility] ONLY)  Result Date: 04/29/2022 There is no interpretation for this exam.  This order  is for images obtained during a surgical procedure.  Please See "Surgeries" Tab for more information regarding the procedure.    Anti-infectives: Anti-infectives (From admission, onward)    None        Assessment/Plan  POD#1 s/p exploratory  laparotomy for Mesenteric ischemia 1/1 Dr. Kieth Brightly - s/p Angioplasty of superior mesenteric artery 1/1 Dr. Stanford Breed  - intra-op findings: no bowel necrosis - Having a lot of abdominal pain this morning but abdomen overall soft, VSS, lactic acid cleared. PCA ordered by vascular. Will add scheduled IV robaxin and IV tylenol. Keep NPO and await return in bowel function. Replace K and check Mag level  ID - none FEN - IVF, NPO VTE - heparin gtt Foley - in place  HTN HLD Tobacco abuse COPD Hx multiple DVTs PVD    LOS: 1 day    Wellington Hampshire, [facility] 04/22/2022, 9:02 AM Please see Amion for pager number during day hours 7:00am-4:30pm

## 2022-04-22 NOTE — Progress Notes (Signed)
PT Cancellation Note  Patient Details Name: Suzanne [name] MRN: [id number] DOB: 04-16-1976   Cancelled Treatment:    Reason Eval/Treat Not Completed: Medical issues which prohibited therapy (discussed with RN who requested hold due to pt fem site bleeding).  Suzanne [name], PT, DPT Acute Rehabilitation Services Office [phone]    Suzanne [name] 04/22/2022, 9:26 AM

## 2022-04-23 ENCOUNTER — Encounter (HOSPITAL_COMMUNITY): Payer: Self-pay | Admitting: Vascular Surgery

## 2022-04-23 LAB — COMPREHENSIVE METABOLIC PANEL
ALT: 29 U/L (ref 0–44)
AST: 56 U/L — ABNORMAL HIGH (ref 15–41)
Albumin: 2.2 g/dL — ABNORMAL LOW (ref 3.5–5.0)
Alkaline Phosphatase: 54 U/L (ref 38–126)
Anion gap: 10 (ref 5–15)
BUN: 19 mg/dL (ref 6–20)
CO2: 20 mmol/L — ABNORMAL LOW (ref 22–32)
Calcium: 7.8 mg/dL — ABNORMAL LOW (ref 8.9–10.3)
Chloride: 101 mmol/L (ref 98–111)
Creatinine, Ser: 0.94 mg/dL (ref 0.44–1.00)
GFR, Estimated: >60 mL/min (ref >60)
Glucose, Bld: 113 mg/dL — ABNORMAL HIGH (ref 70–99)
Potassium: 4.4 mmol/L (ref 3.5–5.1)
Sodium: 131 mmol/L — ABNORMAL LOW (ref 135–145)
Total Bilirubin: 0.7 mg/dL (ref 0.3–1.2)
Total Protein: 5.3 g/dL — ABNORMAL LOW (ref 6.5–8.1)

## 2022-04-23 LAB — HEPARIN LEVEL (UNFRACTIONATED)
Heparin Unfractionated: 0.11 IU/mL — ABNORMAL LOW (ref 0.30–0.70)
Heparin Unfractionated: 0.3 IU/mL (ref 0.30–0.70)

## 2022-04-23 LAB — CBC
HCT: 25 % — ABNORMAL LOW (ref 36.0–46.0)
Hemoglobin: 7.5 g/dL — ABNORMAL LOW (ref 12.0–15.0)
MCH: 21.5 pg — ABNORMAL LOW (ref 26.0–34.0)
MCHC: 30 g/dL (ref 30.0–36.0)
MCV: 71.6 fL — ABNORMAL LOW (ref 80.0–100.0)
Platelets: 427 10*3/uL — ABNORMAL HIGH (ref 150–400)
RBC: 3.49 MIL/uL — ABNORMAL LOW (ref 3.87–5.11)
RDW: 22.5 % — ABNORMAL HIGH (ref 11.5–15.5)
WBC: 36.9 10*3/uL — ABNORMAL HIGH (ref 4.0–10.5)
nRBC: 0.2 % (ref 0.0–0.2)

## 2022-04-23 LAB — APTT: aPTT: 44 seconds — ABNORMAL HIGH (ref 24–36)

## 2022-04-23 LAB — MAGNESIUM: Magnesium: 2.5 mg/dL — ABNORMAL HIGH (ref 1.7–2.4)

## 2022-04-23 LAB — LACTIC ACID, PLASMA: Lactic Acid, Venous: 1.8 mmol/L (ref 0.5–1.9)

## 2022-04-23 MED ORDER — ORAL CARE MOUTH RINSE: 15.0000 mL | OROMUCOSAL | Status: DC | PRN

## 2022-04-23 NOTE — Progress Notes (Signed)
ANTICOAGULATION CONSULT NOTE Pharmacy Consult for heparin Indication:  aortic/SMA thrombus   No Known Allergies  Patient Measurements: Height: '5\' 7"'$  (170.2 cm) Weight: 62.4 kg (137 lb 9.1 oz) IBW/kg (Calculated) : 61.6  Vital Signs: Temp: 97.8 F (36.6 C) (01/03 1107) Temp Source: Oral (01/03 1107) BP: 142/66 (01/03 1500) Pulse Rate: 97 (01/03 1500)  Labs: Recent Labs    04/22/22 0406 04/22/22 1350 04/22/22 1955 04/23/22 0415 04/23/22 1522  HGB 9.2*  --  8.3* 7.5*  --   HCT 32.0*  --  28.5* 25.0*  --   PLT 468*  --  445* 427*  --   APTT 113* 65*  --  44*  --   LABPROT 15.9*  --   --   --   --   INR 1.3*  --   --   --   --   HEPARINUNFRC 1.01*  --   --  0.11* 0.30  CREATININE 0.84  --  1.01* 0.94  --     Estimated Creatinine Clearance: 73.5 mL/min (by C-G formula based on SCr of 0.94 mg/dL).  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 She is on apixaban PTA for a hypercoagulable disorder (lifelong anticoagulation by hematology at [facility]) with last dose ~ 12/31. Heparin level is low on last check indicating little impact from recent apixaban. Utilizing heparin levels to monitor therapy.   Heparin level 0.3 - at goal but low end of therapeutic. Hgb has been trending down - 7.5 this AM. Platelets are elevated. Per RN, slight blood noted around abdominal incision this AM but nothing concerning. Recheck this PM without bleeding.   Goal of Therapy:  Heparin level 0.3-0.7 units/ml Monitor platelets by anticoagulation protocol: Yes   Plan:  -Increase heparin to 1300 units/hr to keep in range -Heparin level in 6 hours to confirm and monitor for bleeding -Daily Heparin level and CBC   Sloan Leiter, PharmD, BCPS, BCCCP Please refer to [facility] for Farmer numbers 04/23/2022, 4:14 PM

## 2022-04-23 NOTE — Anesthesia Postprocedure Evaluation (Signed)
Anesthesia Post Note  Patient: Suzanne [name]  Procedure(s) Performed: EXPLORATORY LAPAROTOMY (Abdomen) AORTOGRAM (Right: Groin) BALLOON ANGIOPLASTY OF THE SUPERIOR MESENTERIC ARTERY (Right: Groin)     Patient location during evaluation: PACU Anesthesia Type: General Level of consciousness: awake and patient cooperative Pain management: pain level controlled Vital Signs Assessment: post-procedure vital signs reviewed and stable Respiratory status: spontaneous breathing, nonlabored ventilation, respiratory function stable and patient connected to nasal cannula oxygen Cardiovascular status: blood pressure returned to baseline and stable Postop Assessment: no apparent nausea or vomiting Anesthetic complications: no  No notable events documented.  Last Vitals:  Vitals:   04/23/22 0542 04/23/22 0600  BP:  138/80  Pulse:  92  Resp: 13 16  Temp:    SpO2: 96% 98%    Last Pain:  Vitals:   04/23/22 0542  TempSrc:   PainSc: Asleep   Pain Goal: Patients Stated Pain Goal: 2 (04/22/22 2000)                 [name]

## 2022-04-23 NOTE — Progress Notes (Signed)
[facility] Progress Note  2 Days Post-Op  Subjective: CC-  Reports her pain is well-controlled this morning.  No nausea.  Reports some flatus early this morning.  Objective: Vital signs in last 24 hours: Temp:  [97.8 F (36.6 C)-98.2 F (36.8 C)] 97.8 F (36.6 C) (01/03 0400) Pulse Rate:  [73-96] 92 (01/03 0600) Resp:  [13-30] 16 (01/03 0600) BP: (125-163)/(71-110) 138/80 (01/03 0600) SpO2:  [93 %-98 %] 98 % (01/03 0600) Arterial Line BP: (117-191)/(67-137) 117/114 (01/03 0500) FiO2 (%):  [21 %] 21 % (01/03 0542)    Intake/Output from previous day: 01/02 0701 - 01/03 0700 In: 2884.3 [I.V.:1879.3; IV Piggyback:1005] Out: 1085 [Urine:1085] Intake/Output this shift: No intake/output data recorded.  PE: Gen:  Alert, NAD, appears comfortable this morning Card:  RRR Pulm:  rate and effort normal Abd: mild distension but soft, diffuse tenderness but no peritonitis, however dressing and reinforcement saturated with blood and clot, this was removed and a new bulky gauze dressing was applied.  Wound appears intact with staples, evolving ecchymosis and subcutaneous hematoma  Lab Results:  Recent Labs    04/22/22 1955 04/23/22 0415  WBC 39.6* 36.9*  HGB 8.3* 7.5*  HCT 28.5* 25.0*  PLT 445* 427*    BMET Recent Labs    04/22/22 1955 04/23/22 0415  NA 129* 131*  K 4.0 4.4  CL 101 101  CO2 19* 20*  GLUCOSE 124* 113*  BUN 16 19  CREATININE 1.01* 0.94  CALCIUM 8.0* 7.8*    PT/INR Recent Labs    04/22/22 0406  LABPROT 15.9*  INR 1.3*    CMP     Component Value Date/Time   NA 131 (L) 04/23/2022 0415   NA 140 04/23/2011 1538   K 4.4 04/23/2022 0415   K 3.9 04/23/2011 1538   CL 101 04/23/2022 0415   CL 105 04/23/2011 1538   CO2 20 (L) 04/23/2022 0415   CO2 25 04/23/2011 1538   GLUCOSE 113 (H) 04/23/2022 0415   GLUCOSE 117 (H) 04/23/2011 1538   BUN 19 04/23/2022 0415   BUN 2 (L) 04/23/2011 1538   CREATININE 0.94 04/23/2022 0415   CREATININE  0.63 04/23/2011 1538   CALCIUM 7.8 (L) 04/23/2022 0415   CALCIUM 8.9 04/23/2011 1538   PROT 5.3 (L) 04/23/2022 0415   PROT 7.9 04/23/2011 1538   ALBUMIN 2.2 (L) 04/23/2022 0415   ALBUMIN 3.5 04/23/2011 1538   AST 56 (H) 04/23/2022 0415   AST 21 04/23/2011 1538   ALT 29 04/23/2022 0415   ALT 17 04/23/2011 1538   ALKPHOS 54 04/23/2022 0415   ALKPHOS 73 04/23/2011 1538   BILITOT 0.7 04/23/2022 0415   BILITOT 0.4 04/23/2011 1538   GFRNONAA >60 04/23/2022 0415   GFRNONAA >60 04/23/2011 1538   GFRAA >60 12/22/2018 0839   GFRAA >60 04/23/2011 1538   Lipase     Component Value Date/Time   LIPASE 31 04/24/2022 1047   LIPASE 98 04/23/2011 1538       Studies/Results: CT Angio Abd/Pel W and/or Wo Contrast  Result Date: 05/02/2022 CLINICAL DATA:  47 year old female with history of mesenteric ischemia presenting with abdominal pain. EXAM: CT ANGIOGRAPHY ABDOMEN AND PELVIS WITH CONTRAST AND WITHOUT CONTRAST TECHNIQUE: Multidetector CT imaging of the abdomen and pelvis was performed using the standard protocol during bolus administration of intravenous contrast. Multiplanar reconstructed images and MIPs were obtained and reviewed to evaluate the vascular anatomy. RADIATION DOSE REDUCTION: This exam was performed according to the departmental dose-optimization program  which includes automated exposure control, adjustment of the mA and/or kV according to patient size and/or use of iterative reconstruction technique. CONTRAST:  145m OMNIPAQUE IOHEXOL 350 MG/ML SOLN COMPARISON:  10/25/2021 FINDINGS: VASCULAR Aorta: Again seen is extensive fibrofatty atherosclerotic plaque throughout the abdominal aorta which is normal in caliber. Interval decreased burden of supra visceral fibrofatty atherosclerotic plaque. There is persistent plaque like material most prominent at the celiac SMA ostia resulting in up to approximately 70% aortic stenosis at this level. 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 No definite evidence of a splenic artery. SMA: Near complete occlusion of the proximal 1 cm of the superior mesenteric artery. The main SMA reconstitutes and is widely patent until abrupt occlusion about the distal aspect of the main SMA. There is evidence of some peripheral jejunal branches with opacification, however diminutive. Renals: Single bilateral renal arteries are patent without evidence of aneurysm, dissection, vasculitis, fibromuscular dysplasia or significant stenosis. IMA: Chronically occluded. Inflow: Patent without evidence of aneurysm, dissection, vasculitis or significant stenosis. Proximal Outflow: Bilateral common femoral and visualized portions of the superficial and profunda femoral arteries are patent without evidence of aneurysm, dissection, vasculitis or significant stenosis. Veins: No obvious venous abnormality within the limitations of this arterial phase study. Review of the MIP images confirms the above findings. NON-VASCULAR Lower chest: No acute abnormality. Hepatobiliary: Interval development of rounded hypoattenuating mass in the anterior, subcapsular aspect of segment 2 of the liver measuring up to approximately 1.5 cm with associated capsular retraction. Interval resolution of previously visualized hypoattenuating lesion in the anterior right lobe of the liver. The liver is otherwise normal in appearance. The gallbladder is present and mildly distended without wall thickening or pericholecystic fluid. Mild diffuse dilation of the common bile duct without evidence of obstruction, unchanged. No intrahepatic biliary ductal dilation. Pancreas: Unremarkable. No pancreatic ductal dilatation or surrounding inflammatory changes. Spleen: Similar appearing atrophic,  nodular spleen. Adrenals/Urinary Tract: Adrenal glands are unremarkable. Similar appearance of multilobulated bilateral renal cortices with heterogeneous enhancement in keeping with history of prior renal infarctions. No nephrolithiasis or hydronephrosis. Bladder is unremarkable. Stomach/Bowel: Stomach is within normal limits. Appendix appears normal. No evidence of bowel wall thickening, distention, or inflammatory changes. Lymphatic: No abdominopelvic lymphadenopathy. Reproductive: Uterus and bilateral adnexa are unremarkable. Other: No abdominal wall hernia or abnormality. No abdominopelvic ascites. Musculoskeletal: No acute or significant osseous findings. IMPRESSION: 1. 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 2. Interval flush occlusion of the distal main superior mesenteric artery with suggestion of some perfusion of distal jejunal branches. This has radiologic appearance of acute thromboembolism, however there is no evidence of enteric inflammatory changes or pneumatosis suggesting a degree of chronicity. 3. 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 4. Interval development of focal infarction in the subcapsular anterior segment 2 of the liver with associated capsular retraction and resolution of previously visualized right hepatic infarction. DRuthann Cancer MD [facility] Electronically Signed   By: DRuthann CancerM.D.   On: 05/18/2022 19:09   HYBRID OR IMAGING ([facility] ONLY)  Result Date: 05/03/2022 There is no interpretation for this exam.  This order is for images obtained during a surgical procedure.  Please See "Surgeries" Tab for  more information regarding the procedure.    Anti-infectives: Anti-infectives (From admission, onward)    None         Assessment/Plan  POD#2 s/p exploratory laparotomy for Mesenteric ischemia 1/1 Dr. Kieth Brightly - s/p Angioplasty of superior mesenteric artery 1/1 Dr. Stanford Breed  - intra-op findings: no bowel necrosis -Slight increase in lactate yesterday evening, but patient appears more comfortable today and abdominal exam is actually improved compared to yesterday, no fever or tachycardia, WBC slightly down trended compared to yesterday, endorses flatus.  Will continue to monitor abdominal exam, trend lactate.  Would not advance diet just yet, but a few ice chips here and there are okay  ID - none FEN - IVF, NPO VTE - heparin gtt Foley - in place  HTN HLD Tobacco abuse COPD Hx multiple DVTs PVD    LOS: 2 days    Clovis Riley, MD [facility] 04/23/2022, 7:43 AM Please see Amion for pager number during day hours 7:00am-4:30pm

## 2022-04-23 NOTE — Evaluation (Signed)
Physical Therapy Evaluation Patient Details Name: Suzanne [name] MRN: [id number] DOB: 08/02/1975 Today's Date: 04/23/2022  History of Present Illness  Suzanne [name] is a 47 y.o. 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 SMA angioplasty and exploratory laparotomy 1/1. PMHx: aortic thrombus, CRPS, splenic infarct, DVT, RUE amputation.  Clinical Impression  PTA, pt lives with her significant other in a camper (with running water/electricity per pt) and is independent; pt reports she does not work. Pt overall is mobilizing well. Ambulating 190 ft with no assistive device at a supervision level. VSS throughout. Main limitation is abdominal pain and nausea. Will continue to progress mobility as tolerated.     Recommendations for follow up therapy are one component of a multi-disciplinary discharge planning process, led by the attending physician.  Recommendations may be updated based on patient status, additional functional criteria and insurance authorization.  Follow Up Recommendations No PT follow up      Assistance Recommended at Discharge PRN  Patient can return home with the following  Assistance with cooking/housework;Assist for transportation;Help with stairs or ramp for entrance    Equipment Recommendations None recommended by PT  Recommendations for Other Services       Functional Status Assessment Patient has had a recent decline in their functional status and demonstrates the ability to make significant improvements in function in a reasonable and predictable amount of time.     Precautions / Restrictions Precautions Precautions: Fall;Other (comment) Precaution Comments: PCA, RUE amputation Restrictions Weight Bearing Restrictions: No      Mobility  Bed Mobility Overal bed mobility: Needs Assistance Bed Mobility: Rolling, Sidelying to Sit, Sit to Sidelying Rolling: Supervision Sidelying to sit:  Supervision   Sit to supine: Supervision   General bed mobility comments: Cues for log rolling for comfort, + use of bed rail, no physical assist required    Transfers Overall transfer level: Needs assistance Equipment used: None Transfers: Sit to/from Stand Sit to Stand: Supervision                Ambulation/Gait Ambulation/Gait assistance: Supervision Gait Distance (Feet): 190 Feet Assistive device: None Gait Pattern/deviations: Step-through pattern, Decreased stride length       General Gait Details: Slow and steady pace  Stairs            Wheelchair Mobility    Modified Rankin (Stroke Patients Only)       Balance Overall balance assessment: No apparent balance deficits (not formally assessed)                                           Pertinent Vitals/Pain Pain Assessment Pain Assessment: Faces Faces Pain Scale: Hurts whole lot Pain Location: abdomen Pain Descriptors / Indicators: Discomfort, Grimacing, Operative site guarding Pain Intervention(s): Limited activity within patient's tolerance, Premedicated before session, Monitored during session, PCA encouraged    Home Living Family/patient expects to be discharged to:: Private residence Living Arrangements: Spouse/significant other (boyfriend)   Type of Home: Other(Comment) (camper) Home Access: Stairs to enter   Technical brewer of Steps: 1   Home Layout: One level Home Equipment: None      Prior Function Prior Level of Function : Independent/Modified Independent             Mobility Comments: does not work ADLs Comments: has a RUE prosthetic, but "  hates it."     Hand Dominance        Extremity/Trunk Assessment   Upper Extremity Assessment Upper Extremity Assessment: Defer to OT evaluation;RUE deficits/detail RUE Deficits / Details: Hx amputation    Lower Extremity Assessment Lower Extremity Assessment: Overall WFL for tasks assessed     Cervical / Trunk Assessment Cervical / Trunk Assessment: Normal  Communication   Communication: No difficulties  Cognition Arousal/Alertness: Awake/alert Behavior During Therapy: WFL for tasks assessed/performed Overall Cognitive Status: Within Functional Limits for tasks assessed                                          General Comments      Exercises     Assessment/Plan    PT Assessment Patient needs continued PT services  PT Problem List Decreased strength;Decreased activity tolerance;Decreased balance;Decreased mobility;Pain       PT Treatment Interventions Gait training;Stair training;Functional mobility training;Therapeutic activities;Therapeutic exercise;Balance training;Patient/family education    PT Goals (Current goals can be found in the Care Plan section)  Acute Rehab PT Goals Patient Stated Goal: less pain PT Goal Formulation: With patient Time For Goal Achievement: 05/07/22 Potential to Achieve Goals: Good    Frequency Min 3X/week     Co-evaluation               AM-PAC PT "6 Clicks" Mobility  Outcome Measure Help needed turning from your back to your side while in a flat bed without using bedrails?: A Little Help needed moving from lying on your back to sitting on the side of a flat bed without using bedrails?: A Little Help needed moving to and from a bed to a chair (including a wheelchair)?: A Little Help needed standing up from a chair using your arms (e.g., wheelchair or bedside chair)?: A Little Help needed to walk in hospital room?: A Little Help needed climbing 3-5 steps with a railing? : A Little 6 Click Score: 18    End of Session   Activity Tolerance: Patient tolerated treatment well Patient left: in bed;with call bell/phone within reach Nurse Communication: Mobility status PT Visit Diagnosis: Difficulty in walking, not elsewhere classified (R26.2);Pain Pain - part of body:  (pain)    Time: 2263-3354 PT Time  Calculation (min) (ACUTE ONLY): 23 min   Charges:   PT Evaluation $PT Eval Moderate Complexity: 1 Mod PT Treatments $Therapeutic Activity: 8-22 mins        Wyona Almas, PT, DPT Acute Rehabilitation Services Office [phone]   Deno Etienne 04/23/2022, 10:41 AM

## 2022-04-23 NOTE — Progress Notes (Addendum)
OT Cancellation Note  Patient Details Name: TEJA COSTEN MRN: [id number] DOB: [birth year]   Cancelled Treatment:    Reason Eval/Treat Not Completed: Patient declined, no reason specified (Pt reports she is "too sleepy" and uncomfortable due to her catheter, declining OOB participatoin despite maximal encouragement. OT to continue efforts as pt allows.)  1630 addendum: Pt continues to want to rest this afternoon, agreeable to OT evaluation tomorrow morning.   Aldona Bar D Causey 04/23/2022, 1:50 PM

## 2022-04-23 NOTE — Progress Notes (Addendum)
ANTICOAGULATION CONSULT NOTE Pharmacy Consult for heparin Indication:  aortic/SMA thrombus   No Known Allergies  Patient Measurements: Weight: 62.4 kg (137 lb 9.1 oz)  Vital Signs: Temp: 97.8 F (36.6 C) (01/03 0400) Temp Source: Oral (01/03 0000) BP: 138/80 (01/03 0600) Pulse Rate: 92 (01/03 0600)  Labs: Recent Labs    04/22/22 0406 04/22/22 1350 04/22/22 1955 04/23/22 0415  HGB 9.2*  --  8.3* 7.5*  HCT 32.0*  --  28.5* 25.0*  PLT 468*  --  445* 427*  APTT 113* 65*  --  44*  LABPROT 15.9*  --   --   --   INR 1.3*  --   --   --   HEPARINUNFRC 1.01*  --   --  0.11*  CREATININE 0.84  --  1.01* 0.94     Estimated Creatinine Clearance: 73.5 mL/min (by C-G formula based on SCr of 0.94 mg/dL).  Assessment: 47 year old female withSMA thrombus s/p angioplasty and exploratory laparotomy on heparin. She is on apixaban PTA for ahypercoagulable disorder (lifelong anticoagulation by hematology at [facility]) with last dose ~ 12/31.  -aPTT= 44 and heparin level= 0.11  Goal of Therapy:  Heparin level 0.3-0.7 units/ml aPTT 66-102 seconds Monitor platelets by anticoagulation protocol: Yes   Plan:  -Increase heparin to 1250 units/hr -Heparin level in 6 hours and daily wth CBC daily -d/c daily aPTT  Hildred Laser, PharmD Clinical Pharmacist **Pharmacist phone directory can now be found on [email or web address] ([email or web address]).  Listed under [place].

## 2022-04-23 NOTE — Progress Notes (Addendum)
  Progress Note    04/23/2022 7:53 AM 2 Days Post-Op  Subjective:  pain better controlled today.  Passing flatus   Vitals:   04/23/22 0600 04/23/22 0700  BP: 138/80 (!) 144/87  Pulse: 92 89  Resp: 16 16  Temp:    SpO2: 98% 98%   Physical Exam: Lungs:  non labored Incisions:  R groin without hematoma Extremities:  feet warm and well perfused Abdomen:  soft, peri-incisional pain Neurologic: A&O  CBC    Component Value Date/Time   WBC 36.9 (H) 04/23/2022 0415   RBC 3.49 (L) 04/23/2022 0415   HGB 7.5 (L) 04/23/2022 0415   HGB 17.1 (H) 04/23/2011 1538   HCT 25.0 (L) 04/23/2022 0415   HCT 50.5 (H) 04/23/2011 1538   PLT 427 (H) 04/23/2022 0415   PLT 419 04/23/2011 1538   MCV 71.6 (L) 04/23/2022 0415   MCV 98 04/23/2011 1538   MCH 21.5 (L) 04/23/2022 0415   MCHC 30.0 04/23/2022 0415   RDW 22.5 (H) 04/23/2022 0415   RDW 14.9 (H) 04/23/2011 1538   LYMPHSABS 2.2 10/24/2021 0426   MONOABS 2.1 (H) 10/24/2021 0426   EOSABS 0.1 10/24/2021 0426   BASOSABS 0.1 10/24/2021 0426    BMET    Component Value Date/Time   NA 131 (L) 04/23/2022 0415   NA 140 04/23/2011 1538   K 4.4 04/23/2022 0415   K 3.9 04/23/2011 1538   CL 101 04/23/2022 0415   CL 105 04/23/2011 1538   CO2 20 (L) 04/23/2022 0415   CO2 25 04/23/2011 1538   GLUCOSE 113 (H) 04/23/2022 0415   GLUCOSE 117 (H) 04/23/2011 1538   BUN 19 04/23/2022 0415   BUN 2 (L) 04/23/2011 1538   CREATININE 0.94 04/23/2022 0415   CREATININE 0.63 04/23/2011 1538   CALCIUM 7.8 (L) 04/23/2022 0415   CALCIUM 8.9 04/23/2011 1538   GFRNONAA >60 04/23/2022 0415   GFRNONAA >60 04/23/2011 1538   GFRAA >60 12/22/2018 0839   GFRAA >60 04/23/2011 1538    INR    Component Value Date/Time   INR 1.3 (H) 04/22/2022 0406     Intake/Output Summary (Last 24 hours) at 04/23/2022 0753 Last data filed at 04/23/2022 0600 Gross per 24 hour  Intake 2884.28 ml  Output 1085 ml  Net 1799.28 ml     Assessment/Plan:  47 y.o. female is s/p  SMA angioplasty and ex lap 2 Days Post-Op   BLE warm and well perfused; R groin cath site without hematoma Pain better controlled with current regimen Lactate elevated yesterday evening; WBC stable, abd soft; continue to trend lactate and abd exam per general surgery Continue IV heparin for now; Hgb 7.5, may need pRBC transfusion; daily CBCs   Dagoberto Ligas, PA-C [facility] [phone] 04/23/2022 7:53 AM  VASCULAR STAFF ADDENDUM: I have independently interviewed and examined the patient. I agree with the above.  Looks much better Exam much improved. Looks ready for stepdown unit.  Remove CVC and Foley.  Yevonne Aline. Stanford Breed, MD [facility] Phone Number: [phone] 04/23/2022 3:07 PM

## 2022-04-24 ENCOUNTER — Inpatient Hospital Stay (HOSPITAL_COMMUNITY): Payer: Medicaid Other

## 2022-04-24 LAB — CBC
HCT: 23.3 % — ABNORMAL LOW (ref 36.0–46.0)
Hemoglobin: 6.8 g/dL — CL (ref 12.0–15.0)
MCH: 21.5 pg — ABNORMAL LOW (ref 26.0–34.0)
MCHC: 29.2 g/dL — ABNORMAL LOW (ref 30.0–36.0)
MCV: 73.5 fL — ABNORMAL LOW (ref 80.0–100.0)
Platelets: 365 10*3/uL (ref 150–400)
RBC: 3.17 MIL/uL — ABNORMAL LOW (ref 3.87–5.11)
RDW: 22.2 % — ABNORMAL HIGH (ref 11.5–15.5)
WBC: 30.3 10*3/uL — ABNORMAL HIGH (ref 4.0–10.5)
nRBC: 0.8 % — ABNORMAL HIGH (ref 0.0–0.2)

## 2022-04-24 LAB — PREPARE RBC (CROSSMATCH)

## 2022-04-24 LAB — HEMOGLOBIN AND HEMATOCRIT, BLOOD
HCT: 26.4 % — ABNORMAL LOW (ref 36.0–46.0)
Hemoglobin: 8.2 g/dL — ABNORMAL LOW (ref 12.0–15.0)

## 2022-04-24 LAB — HEPARIN LEVEL (UNFRACTIONATED)
Heparin Unfractionated: 0.24 IU/mL — ABNORMAL LOW (ref 0.30–0.70)
Heparin Unfractionated: 0.23 IU/mL — ABNORMAL LOW (ref 0.30–0.70)
Heparin Unfractionated: 0.27 IU/mL — ABNORMAL LOW (ref 0.30–0.70)

## 2022-04-24 MED ORDER — DOCUSATE SODIUM 100 MG PO CAPS
100.0000 mg | ORAL_CAPSULE | Freq: Two times a day (BID) | ORAL | Status: DC
  Administered 2022-04-24 – 2022-04-25 (×3): 100 mg via ORAL
  Filled 2022-04-24 (×3): qty 1

## 2022-04-24 MED ORDER — SODIUM CHLORIDE 0.9% IV SOLUTION: Freq: Once | INTRAVENOUS | Status: DC

## 2022-04-24 MED FILL — Hydromorphone HCl Inj 2 MG/ML: INTRAMUSCULAR | Qty: 1 | Status: AC

## 2022-04-24 NOTE — Evaluation (Signed)
Occupational Therapy Evaluation Patient Details Name: Suzanne [name] MRN: [id number] DOB: October 01, 1975 Today's Date: 04/24/2022   History of Present Illness Suzanne [name] is a 47 y.o. 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 SMA angioplasty and exploratory laparotomy 1/1. PMHx: aortic thrombus, CRPS, splenic infarct, DVT, RUE amputation 9/22.   Clinical Impression   PTA pt lives @ modified independent level in her camper with her significant other. Assessment limited by pain and regardless of encouragement and pt's use of PCA pump, pt declined to mobilize OOB and states she will do it "tomorrow".  Pt should progress well and be able to DC home without OT follow up. Acute OT will follow to maximize independence with ADL and mobility to facilitate safe DC home with intermittent S.      Recommendations for follow up therapy are one component of a multi-disciplinary discharge planning process, led by the attending physician.  Recommendations may be updated based on patient status, additional functional criteria and insurance authorization.   Follow Up Recommendations  No OT follow up     Assistance Recommended at Discharge Intermittent Supervision/Assistance  Patient can return home with the following A little help with bathing/dressing/bathroom;Assist for transportation;Assistance with cooking/housework    Functional Status Assessment  Patient has had a recent decline in their functional status and demonstrates the ability to make significant improvements in function in a reasonable and predictable amount of time.  Equipment Recommendations  None recommended by OT    Recommendations for Other Services       Precautions / Restrictions Precautions Precautions: Fall;Other (comment) Precaution Comments: PCA, RUE amputation Restrictions Weight Bearing Restrictions: No      Mobility Bed Mobility Overal bed  mobility: Needs Assistance Bed Mobility: Rolling, Sidelying to Sit, Sit to Sidelying Rolling: Supervision Sidelying to sit: Supervision   Sit to supine: Supervision   General bed mobility comments: Cues for log rolling for comfort, + use of bed rail, no physical assist required    Transfers                   General transfer comment: Was supervision for PT; declined further mobility with OT      Balance Overall balance assessment: No apparent balance deficits (not formally assessed)                                         ADL either performed or assessed with clinical judgement   ADL Overall ADL's : Needs assistance/impaired Eating/Feeding:  (ice chips)   Grooming: Set up   Upper Body Bathing: Set up   Lower Body Bathing: Minimal assistance;Bed level   Upper Body Dressing : Minimal assistance;Bed level   Lower Body Dressing: Moderate assistance;Bed level   Toilet Transfer:  (declined; states she has been getting to the [facility] wtih nsg "watching")                   Vision  No apparent deficits       Perception     Praxis      Pertinent Vitals/Pain Pain Assessment Pain Assessment: Faces Faces Pain Scale: Hurts whole lot Pain Location: abdomen Pain Descriptors / Indicators: Discomfort, Grimacing, Operative site guarding Pain Intervention(s): Limited activity within patient's tolerance, PCA encouraged     Hand Dominance Left (due to RUE amputation)   Extremity/Trunk  Assessment Upper Extremity Assessment Upper Extremity Assessment: RUE deficits/detail;LUE deficits/detail RUE Deficits / Details: Hx amputation LUE Deficits / Details: WFL   Lower Extremity Assessment Lower Extremity Assessment: Defer to PT evaluation   Cervical / Trunk Assessment Cervical / Trunk Assessment: Normal;Other exceptions (abdominal surgery)   Communication Communication Communication: No difficulties   Cognition Arousal/Alertness:  Awake/alert Behavior During Therapy: WFL for tasks assessed/performed - flat Overall Cognitive Status: Within Functional Limits for tasks assessed States "it's been a bad year and I don't want ot talk about it"                                       General Comments       Exercises     Shoulder Instructions      Home Living Family/patient expects to be discharged to:: Private residence Living Arrangements: Spouse/significant other (boyfriend) Available Help at Discharge: Available PRN/intermittently Type of Home: Other(Comment) (camper) Home Access: Stairs to enter Entrance Stairs-Number of Steps: 2   Home Layout: One level     Bathroom Shower/Tub: Occupational psychologist: Standard     Home Equipment: BSC/3in1          Prior Functioning/Environment Prior Level of Function : Independent/Modified Independent             Mobility Comments: does not work ADLs Comments: has a RUE prosthetic, but "hates it."        OT Problem List: Decreased activity tolerance;Decreased range of motion;Decreased knowledge of use of DME or AE;Pain      OT Treatment/Interventions: Self-care/ADL training;DME and/or AE instruction;Therapeutic activities;Patient/family education    OT Goals(Current goals can be found in the care plan section) Acute Rehab OT Goals Patient Stated Goal: to not be in pain OT Goal Formulation: With patient Time For Goal Achievement: 05/08/22 Potential to Achieve Goals: Good  OT Frequency: Min 2X/week    Co-evaluation              AM-PAC OT "6 Clicks" Daily Activity     Outcome Measure Help from another person eating meals?: A Little Help from another person taking care of personal grooming?: A Little Help from another person toileting, which includes using toliet, bedpan, or urinal?: A Little Help from another person bathing (including washing, rinsing, drying)?: A Little Help from another person to put on and taking  off regular upper body clothing?: A Little Help from another person to put on and taking off regular lower body clothing?: A Little 6 Click Score: 18   End of Session Nurse Communication: Mobility status (encourage ambulation)  Activity Tolerance: Patient limited by pain Patient left: in bed;with call bell/phone within reach;with bed alarm set  OT Visit Diagnosis: Unsteadiness on feet (R26.81);Muscle weakness (generalized) (M62.81);Pain Pain - part of body:  (abdomen)                Time: 6389-3734 OT Time Calculation (min): 13 min Charges:  OT General Charges $OT Visit: 1 Visit OT Evaluation $OT Eval Moderate Complexity: [place], OT/L   Acute OT Clinical Specialist Acute Rehabilitation Services Pager [phone] Office [phone]   [facility] [place] [place] 04/24/2022, 4:11 PM

## 2022-04-24 NOTE — Progress Notes (Addendum)
[facility] [place]  Subjective  - Feeling a little better.  Passing flatus.   Objective (!) 148/79 96 97.8 F (36.6 C) (Oral) 16 92%  Intake/Output Summary (Last 24 hours) at 04/24/2022 0743 Last data filed at 04/24/2022 3825 Gross per 24 hour  Intake 3082.95 ml  Output 590 ml  Net 2492.95 ml    Very minimal BS to auscultation Soft without tenderness laterally Feet warm well perused without ischemic changes Right groin soft without hematoma  Assessment/Planning: POD # 3 PROCEDURE:   1) Ultrasound guided right common femoral artery access 2) Aortogram (66m total contrast) 3) Angioplasty of superior mesenteric artery ([id number]) 4) exploratory laparotomy (per Dr. 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   Flatus per patient report, minimal BS to auscultation Requiring O2 support will order chest Xray.  RN reported drops below 90 on 2 L. Afebrile, non labored breathing, leukocytosis.  HGB 6.8 currently receiving PRBC will check labs in am Urine OP 590 last 24 hours, Cr 1.01 will observe    EmRoxy Horseman/07/2022 7:43 AM --  Laboratory Lab Results: Recent Labs    04/23/22 0415 04/24/22 0130  WBC 36.9* 30.3*  HGB 7.5* 6.8*  HCT 25.0* 23.3*  PLT 427* 365   BMET Recent Labs    04/22/22 1955 04/23/22 0415  NA 129* 131*  K 4.0 4.4  CL 101 101  CO2 19* 20*  GLUCOSE 124* 113*  BUN 16 19  CREATININE 1.01* 0.94  CALCIUM 8.0* 7.8*    COAG Lab Results  Component Value Date   INR 1.3 (H) 04/22/2022   INR 1.1 10/22/2021   INR 1.1 08/17/2020   No results found for: "PTT"  VASCULAR STAFF ADDENDUM: I have independently interviewed and examined the patient. I agree with the above.  Slow progress. Sips today. 1u PRBC.  ThYevonne AlineHaStanford BreedMD [facility] Number: [phone]/07/2022 10:35 AM

## 2022-04-24 NOTE — Progress Notes (Signed)
[facility] Progress Note  3 Days Post-Op  Subjective: CC-  Reports continued improvement, flatus but no BM. No nausea. No appetite yet.  Objective: Vital signs in last 24 hours: Temp:  [97.6 F (36.4 C)-98 F (36.7 C)] 97.8 F (36.6 C) (01/04 0801) Pulse Rate:  [90-105] 98 (01/04 0801) Resp:  [10-20] 17 (01/04 0801) BP: (134-160)/(55-81) 160/75 (01/04 0801) SpO2:  [90 %-99 %] 91 % (01/04 0801) FiO2 (%):  [24 %] 24 % (01/04 0710) Last BM Date : 04/23/22  Intake/Output from previous day: 01/03 0701 - 01/04 0700 In: 3083 [P.O.:180; I.V.:2583; IV Piggyback:320] Out: 590 [Urine:590] Intake/Output this shift: Total I/O In: 315 [Blood:315] Out: -   PE: Gen:  Alert, NAD, appears comfortable this morning Card:  RRR Pulm:  rate and effort normal Abd: mild distension but soft, diffuse tenderness but no peritonitis. Yesterday morning's dressing is intact with blood saturation of about half the gauze. Still some oozing between staples. Wound appears intact with staples, evolving ecchymosis and subcutaneous hematoma  Lab Results:  Recent Labs    04/23/22 0415 04/24/22 0130  WBC 36.9* 30.3*  HGB 7.5* 6.8*  HCT 25.0* 23.3*  PLT 427* 365    BMET Recent Labs    04/22/22 1955 04/23/22 0415  NA 129* 131*  K 4.0 4.4  CL 101 101  CO2 19* 20*  GLUCOSE 124* 113*  BUN 16 19  CREATININE 1.01* 0.94  CALCIUM 8.0* 7.8*    PT/INR Recent Labs    04/22/22 0406  LABPROT 15.9*  INR 1.3*    CMP     Component Value Date/Time   NA 131 (L) 04/23/2022 0415   NA 140 04/23/2011 1538   K 4.4 04/23/2022 0415   K 3.9 04/23/2011 1538   CL 101 04/23/2022 0415   CL 105 04/23/2011 1538   CO2 20 (L) 04/23/2022 0415   CO2 25 04/23/2011 1538   GLUCOSE 113 (H) 04/23/2022 0415   GLUCOSE 117 (H) 04/23/2011 1538   BUN 19 04/23/2022 0415   BUN 2 (L) 04/23/2011 1538   CREATININE 0.94 04/23/2022 0415   CREATININE 0.63 04/23/2011 1538   CALCIUM 7.8 (L) 04/23/2022 0415    CALCIUM 8.9 04/23/2011 1538   PROT 5.3 (L) 04/23/2022 0415   PROT 7.9 04/23/2011 1538   ALBUMIN 2.2 (L) 04/23/2022 0415   ALBUMIN 3.5 04/23/2011 1538   AST 56 (H) 04/23/2022 0415   AST 21 04/23/2011 1538   ALT 29 04/23/2022 0415   ALT 17 04/23/2011 1538   ALKPHOS 54 04/23/2022 0415   ALKPHOS 73 04/23/2011 1538   BILITOT 0.7 04/23/2022 0415   BILITOT 0.4 04/23/2011 1538   GFRNONAA >60 04/23/2022 0415   GFRNONAA >60 04/23/2011 1538   GFRAA >60 12/22/2018 0839   GFRAA >60 04/23/2011 1538   Lipase     Component Value Date/Time   LIPASE 31 04/23/2022 1047   LIPASE 98 04/23/2011 1538       Studies/Results: No results found.  Anti-infectives: Anti-infectives (From admission, onward)    None        Assessment/Plan  POD#3 s/p exploratory laparotomy for Mesenteric ischemia 1/1 Dr. Kieth Brightly - s/p Angioplasty of superior mesenteric artery 1/1 Dr. Stanford Breed  - intra-op findings: no bowel necrosis -Will continue to monitor abdominal exam; lactate normalized yest. And WBC downtrending.  Would not advance diet just yet, ok for ice chips and one icee/popsicle today if desired -Mobilize as tolerated -ABLA- hgb 6.8 this AM likely from her subcu. Primary  team rechecking later this morning, will likely need transfusion.  \ ID - none FEN - IVF, NPO VTE - heparin gtt Foley - in place  HTN HLD Tobacco abuse COPD Hx multiple DVTs PVD    LOS: 3 days    Clovis Riley, MD [facility] 04/24/2022, 8:45 AM Please see Amion for pager number during day hours 7:00am-4:30pm

## 2022-04-24 NOTE — Progress Notes (Signed)
Mobility Specialist - Progress Note   04/24/22 1238  Mobility  Activity Transferred to/from [facility]  Level of Assistance Minimal assist, patient does 75% or more  Assistive Device None  Activity Response Tolerated fair  Mobility Referral Yes  $Mobility charge 1 Mobility   Pt was received in bed needing assistance to [facility]. Pt was MinA throughout transfer. Further mobility was deferred d/t increased abdominal pain while moving. Pt was returned to bed with all needs met.   Franki Monte  Mobility Specialist Please contact via Solicitor or Rehab office at [phone]

## 2022-04-24 NOTE — Progress Notes (Signed)
ANTICOAGULATION CONSULT NOTE Pharmacy Consult for heparin Indication:  aortic/SMA thrombus   No Known Allergies  Patient Measurements: Height: '5\' 7"'$  (170.2 cm) Weight: 62.4 kg (137 lb 9.1 oz) IBW/kg (Calculated) : 61.6  Vital Signs: Temp: 97.8 F (36.6 C) (01/04 1157) Temp Source: Oral (01/04 1157) BP: 129/71 (01/04 1700) Pulse Rate: 100 (01/04 1700)  Labs: Recent Labs    04/22/22 0406 04/22/22 1350 04/22/22 1955 04/23/22 0415 04/23/22 1522 04/24/22 0130 04/24/22 1110 04/24/22 1111 04/24/22 1830  HGB 9.2*  --  8.3* 7.5*  --  6.8* 8.2*  --   --   HCT 32.0*  --  28.5* 25.0*  --  23.3* 26.4*  --   --   PLT 468*  --  445* 427*  --  365  --   --   --   APTT 113* 65*  --  44*  --   --   --   --   --   LABPROT 15.9*  --   --   --   --   --   --   --   --   INR 1.3*  --   --   --   --   --   --   --   --   HEPARINUNFRC 1.01*  --   --  0.11*   < > 0.24*  --  0.23* 0.27*  CREATININE 0.84  --  1.01* 0.94  --   --   --   --   --    < > = values in this interval not displayed.     Estimated Creatinine Clearance: 73.5 mL/min (by C-G formula based on SCr of 0.94 mg/dL).  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 She is on apixaban PTA for a hypercoagulable disorder (lifelong anticoagulation by hematology at [facility]) with last dose ~ 12/31. Heparin level is low on last check indicating little impact from recent apixaban. Utilizing heparin levels to monitor therapy.   Heparin level low at 0.27. No issues with infusion or worsened oozing from hematoma this evening per RN.  Goal of Therapy:  Heparin level 0.3-0.7 units/ml Monitor platelets by anticoagulation protocol: Yes   Plan:  -Increase heparin to 1650 units/h -Recheck heparin level in 6h  Dimple Nanas, PharmD, BCPS 04/24/2022 7:57 PM

## 2022-04-24 NOTE — Progress Notes (Signed)
ANTICOAGULATION CONSULT NOTE Pharmacy Consult for heparin Indication:  aortic/SMA thrombus   No Known Allergies  Patient Measurements: Height: '5\' 7"'$  (170.2 cm) Weight: 62.4 kg (137 lb 9.1 oz) IBW/kg (Calculated) : 61.6  Vital Signs: Temp: 97.8 F (36.6 C) (01/04 1157) Temp Source: Oral (01/04 1157) BP: 156/102 (01/04 1157) Pulse Rate: 102 (01/04 1157)  Labs: Recent Labs    04/22/22 0406 04/22/22 1350 04/22/22 1955 04/23/22 0415 04/23/22 1522 04/24/22 0130 04/24/22 1110 04/24/22 1111  HGB 9.2*  --  8.3* 7.5*  --  6.8* 8.2*  --   HCT 32.0*  --  28.5* 25.0*  --  23.3* 26.4*  --   PLT 468*  --  445* 427*  --  365  --   --   APTT 113* 65*  --  44*  --   --   --   --   LABPROT 15.9*  --   --   --   --   --   --   --   INR 1.3*  --   --   --   --   --   --   --   HEPARINUNFRC 1.01*  --   --  0.11* 0.30 0.24*  --  0.23*  CREATININE 0.84  --  1.01* 0.94  --   --   --   --      Estimated Creatinine Clearance: 73.5 mL/min (by C-G formula based on SCr of 0.94 mg/dL).  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 She is on apixaban PTA for a hypercoagulable disorder (lifelong anticoagulation by hematology at [facility]) with last dose ~ 12/31. Heparin level is low on last check indicating little impact from recent apixaban. Utilizing heparin levels to monitor therapy.   Heparin level remains low, no infusion issues so far today. Pt with known hematoma, some oozing present today.  Goal of Therapy:  Heparin level 0.3-0.7 units/ml Monitor platelets by anticoagulation protocol: Yes   Plan:  -Increase heparin to 1550 units/h -Recheck heparin level in 6h  Arrie Senate, PharmD, [place], [facility] Clinical Pharmacist [phone] Please check AMION for all [facility] Pharmacy numbers 04/24/2022

## 2022-04-24 NOTE — Progress Notes (Signed)
Pt's Hgb 6.8, Heparin gtt still running. Attempted to page the on-call Vascular: Virl Cagey, MD @ [phone]. Main office number was also paged @ [phone]. Awaiting further orders.   Elaina Hoops, RN

## 2022-04-24 NOTE — Progress Notes (Signed)
ANTICOAGULATION CONSULT NOTE - Follow Up Consult  Pharmacy Consult for heparin Indication:  aortic/SMA thrombus  Labs: Recent Labs    04/22/22 0406 04/22/22 1350 04/22/22 1955 04/23/22 0415 04/23/22 1522 04/24/22 0130  HGB 9.2*  --  8.3* 7.5*  --  6.8*  HCT 32.0*  --  28.5* 25.0*  --  23.3*  PLT 468*  --  445* 427*  --  365  APTT 113* 65*  --  44*  --   --   LABPROT 15.9*  --   --   --   --   --   INR 1.3*  --   --   --   --   --   HEPARINUNFRC 1.01*  --   --  0.11* 0.30 0.24*  CREATININE 0.84  --  1.01* 0.94  --   --     Assessment: 46yo female subtherapeutic on heparin after one level at goal, possibly from apixaban effect on anti-Xa clearing; no infusion issues or signs of bleeding per RN though notes Hgb 6.8 >> deferred rate change until RN spoke w/ vascular, who recommends continuing heparin and starting transfusion.  Goal of Therapy:  Heparin level 0.3-0.7 units/ml   Plan:  Will increase heparin infusion cautiously to 1400 units/hr and check level in 6 hours.    Wynona Neat, PharmD, BCPS  04/24/2022,5:25 AM

## 2022-04-25 ENCOUNTER — Encounter (HOSPITAL_COMMUNITY): Admission: EM | Disposition: E | Payer: Self-pay | Source: Ambulatory Visit | Attending: Vascular Surgery

## 2022-04-25 ENCOUNTER — Inpatient Hospital Stay (HOSPITAL_COMMUNITY): Payer: Medicaid Other | Admitting: Anesthesiology

## 2022-04-25 ENCOUNTER — Inpatient Hospital Stay (HOSPITAL_COMMUNITY): Payer: Medicaid Other

## 2022-04-25 ENCOUNTER — Other Ambulatory Visit: Payer: Self-pay

## 2022-04-25 DIAGNOSIS — I10 Essential (primary) hypertension: Secondary | ICD-10-CM

## 2022-04-25 DIAGNOSIS — R579 Shock, unspecified: Secondary | ICD-10-CM | POA: Diagnosis not present

## 2022-04-25 DIAGNOSIS — I82409 Acute embolism and thrombosis of unspecified deep veins of unspecified lower extremity: Secondary | ICD-10-CM

## 2022-04-25 DIAGNOSIS — I708 Atherosclerosis of other arteries: Secondary | ICD-10-CM

## 2022-04-25 DIAGNOSIS — F17210 Nicotine dependence, cigarettes, uncomplicated: Secondary | ICD-10-CM | POA: Diagnosis not present

## 2022-04-25 DIAGNOSIS — I774 Celiac artery compression syndrome: Secondary | ICD-10-CM

## 2022-04-25 HISTORY — PX: APPLICATION OF WOUND VAC: SHX5189

## 2022-04-25 HISTORY — PX: LAPAROTOMY: SHX154

## 2022-04-25 LAB — TYPE AND SCREEN
ABO/RH(D): B NEG
Antibody Screen: NEGATIVE
Unit division: 0
Unit division: 0

## 2022-04-25 LAB — MAGNESIUM: Magnesium: 2.2 mg/dL (ref 1.7–2.4)

## 2022-04-25 LAB — HEPARIN LEVEL (UNFRACTIONATED)
Heparin Unfractionated: 0.31 IU/mL (ref 0.30–0.70)
Heparin Unfractionated: 0.33 IU/mL (ref 0.30–0.70)

## 2022-04-25 LAB — POCT I-STAT 7, (LYTES, BLD GAS, ICA,H+H)
Acid-base deficit: 18 mmol/L — ABNORMAL HIGH (ref 0.0–2.0)
Bicarbonate: 11.1 mmol/L — ABNORMAL LOW (ref 20.0–28.0)
Calcium, Ion: 1.16 mmol/L (ref 1.15–1.40)
HCT: 22 % — ABNORMAL LOW (ref 36.0–46.0)
Hemoglobin: 7.5 g/dL — ABNORMAL LOW (ref 12.0–15.0)
O2 Saturation: 96 %
Patient temperature: 36
Potassium: 4.7 mmol/L (ref 3.5–5.1)
Sodium: 133 mmol/L — ABNORMAL LOW (ref 135–145)
TCO2: 12 mmol/L — ABNORMAL LOW (ref 22–32)
pCO2 arterial: 39 mmHg (ref 32–48)
pH, Arterial: 7.054 — CL (ref 7.35–7.45)
pO2, Arterial: 107 mmHg (ref 83–108)

## 2022-04-25 LAB — BPAM RBC
Blood Product Expiration Date: 202401232359
Blood Product Expiration Date: 202401292359
ISSUE DATE / TIME: 202401040458
Unit Type and Rh: 1700
Unit Type and Rh: 1700

## 2022-04-25 LAB — CBC
HCT: 25.2 % — ABNORMAL LOW (ref 36.0–46.0)
Hemoglobin: 7.8 g/dL — ABNORMAL LOW (ref 12.0–15.0)
MCH: 22.9 pg — ABNORMAL LOW (ref 26.0–34.0)
MCHC: 31 g/dL (ref 30.0–36.0)
MCV: 73.9 fL — ABNORMAL LOW (ref 80.0–100.0)
Platelets: 286 10*3/uL (ref 150–400)
RBC: 3.41 MIL/uL — ABNORMAL LOW (ref 3.87–5.11)
RDW: 23.3 % — ABNORMAL HIGH (ref 11.5–15.5)
WBC: 18.1 10*3/uL — ABNORMAL HIGH (ref 4.0–10.5)

## 2022-04-25 LAB — BASIC METABOLIC PANEL
Anion gap: 11 (ref 5–15)
BUN: 16 mg/dL (ref 6–20)
CO2: 17 mmol/L — ABNORMAL LOW (ref 22–32)
Calcium: 8 mg/dL — ABNORMAL LOW (ref 8.9–10.3)
Chloride: 102 mmol/L (ref 98–111)
Creatinine, Ser: 0.83 mg/dL (ref 0.44–1.00)
GFR, Estimated: >60 mL/min (ref >60)
Glucose, Bld: 86 mg/dL (ref 70–99)
Potassium: 4.5 mmol/L (ref 3.5–5.1)
Sodium: 130 mmol/L — ABNORMAL LOW (ref 135–145)

## 2022-04-25 LAB — GLUCOSE, CAPILLARY
Glucose-Capillary: 134 mg/dL — ABNORMAL HIGH (ref 70–99)
Glucose-Capillary: <10 mg/dL — CL (ref 70–99)
Glucose-Capillary: <10 mg/dL — CL (ref 70–99)

## 2022-04-25 LAB — PREPARE RBC (CROSSMATCH)

## 2022-04-25 LAB — LACTIC ACID, PLASMA: Lactic Acid, Venous: >9 mmol/L (ref 0.5–1.9)

## 2022-04-25 SURGERY — LAPAROTOMY, EXPLORATORY
Anesthesia: General | Site: Abdomen

## 2022-04-25 MED ORDER — ROCURONIUM BROMIDE 10 MG/ML (PF) SYRINGE
PREFILLED_SYRINGE | INTRAVENOUS | Status: DC | PRN
  Administered 2022-04-25: 100 mg via INTRAVENOUS

## 2022-04-25 MED ORDER — CEFAZOLIN SODIUM 1 G IJ SOLR
INTRAMUSCULAR | Status: AC
  Filled 2022-04-25: qty 20

## 2022-04-25 MED ORDER — GLYCOPYRROLATE 1 MG PO TABS: 1.0000 mg | ORAL_TABLET | ORAL | Status: DC | PRN

## 2022-04-25 MED ORDER — SUCCINYLCHOLINE CHLORIDE 200 MG/10ML IV SOSY
PREFILLED_SYRINGE | INTRAVENOUS | Status: AC
  Filled 2022-04-25: qty 10

## 2022-04-25 MED ORDER — DEXAMETHASONE SODIUM PHOSPHATE 10 MG/ML IJ SOLN
INTRAMUSCULAR | Status: AC
  Filled 2022-04-25: qty 3

## 2022-04-25 MED ORDER — CALCIUM CHLORIDE 10 % IV SOLN
INTRAVENOUS | Status: DC | PRN
  Administered 2022-04-25: 500 mg via INTRAVENOUS

## 2022-04-25 MED ORDER — ALBUMIN HUMAN 5 % IV SOLN: INTRAVENOUS | Status: DC | PRN

## 2022-04-25 MED ORDER — MIDAZOLAM HCL 2 MG/2ML IJ SOLN: 1.0000 mg | INTRAMUSCULAR | Status: DC | PRN

## 2022-04-25 MED ORDER — PROPOFOL 500 MG/50ML IV EMUL
INTRAVENOUS | Status: DC | PRN
  Administered 2022-04-25: 20 ug/kg/min via INTRAVENOUS

## 2022-04-25 MED ORDER — FENTANYL 2500MCG IN NS 250ML (10MCG/ML) PREMIX INFUSION
50.0000 ug/h | INTRAVENOUS | Status: DC
  Administered 2022-04-25: 100 ug/h via INTRAVENOUS
  Filled 2022-04-25: qty 250

## 2022-04-25 MED ORDER — FENTANYL CITRATE PF 50 MCG/ML IJ SOSY: 50.0000 ug | PREFILLED_SYRINGE | Freq: Once | INTRAMUSCULAR | Status: DC

## 2022-04-25 MED ORDER — NOREPINEPHRINE 4 MG/250ML-% IV SOLN
INTRAVENOUS | Status: AC
  Filled 2022-04-25: qty 250

## 2022-04-25 MED ORDER — SODIUM CHLORIDE 0.9 % IV SOLN
250.0000 mL | INTRAVENOUS | Status: DC
  Administered 2022-04-25: 250 mL via INTRAVENOUS

## 2022-04-25 MED ORDER — PROPOFOL 1000 MG/100ML IV EMUL
INTRAVENOUS | Status: AC
  Filled 2022-04-25: qty 100

## 2022-04-25 MED ORDER — VASOPRESSIN 20 UNIT/ML IV SOLN
INTRAVENOUS | Status: AC
  Filled 2022-04-25: qty 1

## 2022-04-25 MED ORDER — PROPOFOL 1000 MG/100ML IV EMUL: 0.0000 ug/kg/min | INTRAVENOUS | Status: DC

## 2022-04-25 MED ORDER — SUCCINYLCHOLINE CHLORIDE 200 MG/10ML IV SOSY
PREFILLED_SYRINGE | INTRAVENOUS | Status: DC | PRN
  Administered 2022-04-25: 120 mg via INTRAVENOUS

## 2022-04-25 MED ORDER — NOREPINEPHRINE 4 MG/250ML-% IV SOLN
2.0000 ug/min | INTRAVENOUS | Status: DC
  Administered 2022-04-25: 2 ug/min via INTRAVENOUS
  Filled 2022-04-25: qty 250

## 2022-04-25 MED ORDER — SODIUM CHLORIDE 0.9% IV SOLUTION: Freq: Once | INTRAVENOUS | Status: DC

## 2022-04-25 MED ORDER — EPINEPHRINE 1 MG/10ML IJ SOSY
PREFILLED_SYRINGE | INTRAMUSCULAR | Status: AC
  Filled 2022-04-25: qty 10

## 2022-04-25 MED ORDER — LACTATED RINGERS IV BOLUS: 250.0000 mL | Freq: Once | INTRAVENOUS | Status: DC

## 2022-04-25 MED ORDER — ROCURONIUM BROMIDE 10 MG/ML (PF) SYRINGE
PREFILLED_SYRINGE | INTRAVENOUS | Status: AC
  Filled 2022-04-25: qty 10

## 2022-04-25 MED ORDER — DEXTROSE 50 % IV SOLN
INTRAVENOUS | Status: AC
  Filled 2022-04-25: qty 100

## 2022-04-25 MED ORDER — PROPOFOL 10 MG/ML IV BOLUS
INTRAVENOUS | Status: AC
  Filled 2022-04-25: qty 20

## 2022-04-25 MED ORDER — LIDOCAINE 2% (20 MG/ML) 5 ML SYRINGE
INTRAMUSCULAR | Status: AC
  Filled 2022-04-25: qty 10

## 2022-04-25 MED ORDER — HALOPERIDOL LACTATE 5 MG/ML IJ SOLN: 2.5000 mg | INTRAMUSCULAR | Status: DC | PRN

## 2022-04-25 MED ORDER — ACETAMINOPHEN 325 MG PO TABS: 650.0000 mg | ORAL_TABLET | Freq: Four times a day (QID) | ORAL | Status: DC | PRN

## 2022-04-25 MED ORDER — MIDAZOLAM HCL 2 MG/2ML IJ SOLN
INTRAMUSCULAR | Status: DC | PRN
  Administered 2022-04-25: 2 mg via INTRAVENOUS

## 2022-04-25 MED ORDER — ETOMIDATE 2 MG/ML IV SOLN
INTRAVENOUS | Status: AC
  Filled 2022-04-25: qty 10

## 2022-04-25 MED ORDER — ALBUTEROL SULFATE (2.5 MG/3ML) 0.083% IN NEBU
INHALATION_SOLUTION | RESPIRATORY_TRACT | Status: AC
  Filled 2022-04-25: qty 3

## 2022-04-25 MED ORDER — ACETAMINOPHEN 650 MG RE SUPP: 650.0000 mg | Freq: Four times a day (QID) | RECTAL | Status: DC | PRN

## 2022-04-25 MED ORDER — MIDAZOLAM BOLUS VIA INFUSION (WITHDRAWAL LIFE SUSTAINING TX)
2.0000 mg | INTRAVENOUS | Status: DC | PRN
  Administered 2022-04-25: 2 mg via INTRAVENOUS

## 2022-04-25 MED ORDER — SODIUM CHLORIDE 0.9 % IV BOLUS: 250.0000 mL | Freq: Once | INTRAVENOUS | Status: DC | PRN

## 2022-04-25 MED ORDER — MIDAZOLAM-SODIUM CHLORIDE 100-0.9 MG/100ML-% IV SOLN
0.0000 mg/h | INTRAVENOUS | Status: DC
  Administered 2022-04-25: 1 mg/h via INTRAVENOUS
  Filled 2022-04-25: qty 100

## 2022-04-25 MED ORDER — GLYCOPYRROLATE 0.2 MG/ML IJ SOLN: 0.2000 mg | INTRAMUSCULAR | Status: DC | PRN

## 2022-04-25 MED ORDER — CALCIUM CHLORIDE 10 % IV SOLN
INTRAVENOUS | Status: AC
  Filled 2022-04-25: qty 10

## 2022-04-25 MED ORDER — ALBUTEROL SULFATE (2.5 MG/3ML) 0.083% IN NEBU
2.5000 mg | INHALATION_SOLUTION | Freq: Once | RESPIRATORY_TRACT | Status: AC | PRN
  Administered 2022-04-25: 2.5 mg via RESPIRATORY_TRACT

## 2022-04-25 MED ORDER — VASOPRESSIN 20 UNIT/ML IV SOLN
INTRAVENOUS | Status: DC | PRN
  Administered 2022-04-25 (×2): 2 [IU] via INTRAVENOUS

## 2022-04-25 MED ORDER — GLYCOPYRROLATE 0.2 MG/ML IJ SOLN
0.2000 mg | INTRAMUSCULAR | Status: DC | PRN
  Administered 2022-04-25: 0.2 mg via INTRAVENOUS
  Filled 2022-04-25: qty 1

## 2022-04-25 MED ORDER — IOHEXOL 350 MG/ML SOLN
100.0000 mL | Freq: Once | INTRAVENOUS | Status: AC | PRN
  Administered 2022-04-25: 100 mL via INTRAVENOUS

## 2022-04-25 MED ORDER — ETOMIDATE 2 MG/ML IV SOLN
INTRAVENOUS | Status: DC | PRN
  Administered 2022-04-25: 10 mg via INTRAVENOUS

## 2022-04-25 MED ORDER — PHENYLEPHRINE 80 MCG/ML (10ML) SYRINGE FOR IV PUSH (FOR BLOOD PRESSURE SUPPORT)
PREFILLED_SYRINGE | INTRAVENOUS | Status: AC
  Filled 2022-04-25: qty 10

## 2022-04-25 MED ORDER — NOREPINEPHRINE 4 MG/250ML-% IV SOLN: 0.0000 ug/min | INTRAVENOUS | Status: DC

## 2022-04-25 MED ORDER — FENTANYL BOLUS VIA INFUSION
50.0000 ug | INTRAVENOUS | Status: DC | PRN
  Administered 2022-04-25 (×2): 100 ug via INTRAVENOUS

## 2022-04-25 MED ORDER — POLYVINYL ALCOHOL 1.4 % OP SOLN: 1.0000 [drp] | Freq: Four times a day (QID) | OPHTHALMIC | Status: DC | PRN

## 2022-04-25 MED ORDER — ONDANSETRON HCL 4 MG/2ML IJ SOLN
INTRAMUSCULAR | Status: AC
  Filled 2022-04-25: qty 6

## 2022-04-25 MED ORDER — FENTANYL CITRATE (PF) 250 MCG/5ML IJ SOLN
INTRAMUSCULAR | Status: AC
  Filled 2022-04-25: qty 5

## 2022-04-25 MED ORDER — VANCOMYCIN HCL IN DEXTROSE 1-5 GM/200ML-% IV SOLN
INTRAVENOUS | Status: AC
  Filled 2022-04-25: qty 200

## 2022-04-25 MED ORDER — MIDAZOLAM HCL 2 MG/2ML IJ SOLN
INTRAMUSCULAR | Status: AC
  Filled 2022-04-25: qty 2

## 2022-04-25 MED ORDER — SODIUM CHLORIDE 0.9 % IV SOLN: INTRAVENOUS | Status: DC

## 2022-04-25 SURGICAL SUPPLY — 76 items
ADH SKN CLS APL DERMABOND .7 (GAUZE/BANDAGES/DRESSINGS) ×3
APL PRP STRL LF DISP 70% ISPRP (MISCELLANEOUS) ×3
BAG COUNTER SPONGE SURGICOUNT (BAG) ×3 IMPLANT
BAG SPNG CNTER NS LX DISP (BAG) ×3
BANDAGE ESMARK 6X9 LF (GAUZE/BANDAGES/DRESSINGS) IMPLANT
BLADE CLIPPER SURG (BLADE) IMPLANT
BNDG CMPR 9X6 STRL LF SNTH (GAUZE/BANDAGES/DRESSINGS)
BNDG ESMARK 6X9 LF (GAUZE/BANDAGES/DRESSINGS)
CANISTER SUCT 3000ML PPV (MISCELLANEOUS) ×6 IMPLANT
CANNULA VESSEL 3MM 2 BLNT TIP (CANNULA) IMPLANT
CHLORAPREP W/TINT 26 (MISCELLANEOUS) ×3 IMPLANT
CLIP LIGATING EXTRA MED SLVR (CLIP) ×3 IMPLANT
CLIP LIGATING EXTRA SM BLUE (MISCELLANEOUS) ×3 IMPLANT
COVER BACK TABLE 60X90IN (DRAPES) ×1 IMPLANT
COVER SURGICAL LIGHT HANDLE (MISCELLANEOUS) ×3 IMPLANT
DERMABOND ADVANCED .7 DNX12 (GAUZE/BANDAGES/DRESSINGS) ×3 IMPLANT
DRAPE C-ARM 42X72 X-RAY (DRAPES) IMPLANT
DRAPE CV SPLIT W-CLR ANES SCRN (DRAPES) ×1 IMPLANT
DRAPE HALF SHEET 40X57 (DRAPES) IMPLANT
DRAPE LAPAROSCOPIC ABDOMINAL (DRAPES) ×3 IMPLANT
DRAPE PERI GROIN 82X75IN TIB (DRAPES) ×1 IMPLANT
DRAPE WARM FLUID 44X44 (DRAPES) ×3 IMPLANT
DRAPE X-RAY CASS 24X20 (DRAPES) IMPLANT
DRSG OPSITE POSTOP 4X10 (GAUZE/BANDAGES/DRESSINGS) IMPLANT
DRSG OPSITE POSTOP 4X8 (GAUZE/BANDAGES/DRESSINGS) IMPLANT
DRSG VAC GRANUFOAM LG (GAUZE/BANDAGES/DRESSINGS) ×1 IMPLANT
ELECT BLADE 6.5 EXT (BLADE) IMPLANT
ELECT CAUTERY BLADE 6.4 (BLADE) ×3 IMPLANT
ELECT REM PT RETURN 9FT ADLT (ELECTROSURGICAL) ×6
ELECTRODE REM PT RTRN 9FT ADLT (ELECTROSURGICAL) ×6 IMPLANT
GLOVE BIO SURGEON STRL SZ 6 (GLOVE) ×3 IMPLANT
GLOVE BIO SURGEON STRL SZ7.5 (GLOVE) ×3 IMPLANT
GLOVE INDICATOR 6.5 STRL GRN (GLOVE) ×3 IMPLANT
GOWN STRL REUS W/ TWL LRG LVL3 (GOWN DISPOSABLE) ×12 IMPLANT
GOWN STRL REUS W/ TWL XL LVL3 (GOWN DISPOSABLE) ×3 IMPLANT
GOWN STRL REUS W/TWL LRG LVL3 (GOWN DISPOSABLE) ×12
GOWN STRL REUS W/TWL XL LVL3 (GOWN DISPOSABLE) ×3
HANDLE SUCTION POOLE (INSTRUMENTS) ×3 IMPLANT
HEMOSTAT SNOW SURGICEL 2X4 (HEMOSTASIS) IMPLANT
INSERT FOGARTY SM (MISCELLANEOUS) IMPLANT
KIT BASIN OR (CUSTOM PROCEDURE TRAY) ×6 IMPLANT
KIT TURNOVER KIT B (KITS) ×6 IMPLANT
LIGASURE IMPACT 36 18CM CVD LR (INSTRUMENTS) IMPLANT
NS IRRIG 1000ML POUR BTL (IV SOLUTION) ×12 IMPLANT
PACK GENERAL/GYN (CUSTOM PROCEDURE TRAY) ×3 IMPLANT
PACK PERIPHERAL VASCULAR (CUSTOM PROCEDURE TRAY) ×3 IMPLANT
PAD ARMBOARD 7.5X6 YLW CONV (MISCELLANEOUS) ×9 IMPLANT
PENCIL SMOKE EVACUATOR (MISCELLANEOUS) ×3 IMPLANT
POWDER SURGICEL 3.0 GRAM (HEMOSTASIS) IMPLANT
SPECIMEN JAR LARGE (MISCELLANEOUS) IMPLANT
SPONGE T-LAP 18X18 ~~LOC~~+RFID (SPONGE) IMPLANT
STAPLER VISISTAT 35W (STAPLE) ×3 IMPLANT
STOPCOCK 4 WAY LG BORE MALE ST (IV SETS) IMPLANT
SUCTION POOLE HANDLE (INSTRUMENTS) ×3
SUT ETHILON 3 0 PS 1 (SUTURE) IMPLANT
SUT MNCRL AB 4-0 PS2 18 (SUTURE) ×6 IMPLANT
SUT PDS AB 1 TP1 96 (SUTURE) ×6 IMPLANT
SUT PROLENE 5 0 C 1 24 (SUTURE) ×3 IMPLANT
SUT PROLENE 6 0 BV (SUTURE) ×3 IMPLANT
SUT SILK 2 0 SH (SUTURE) ×3 IMPLANT
SUT SILK 2 0 SH CR/8 (SUTURE) ×3 IMPLANT
SUT SILK 2 0 TIES 10X30 (SUTURE) ×3 IMPLANT
SUT SILK 3 0 (SUTURE)
SUT SILK 3 0 SH CR/8 (SUTURE) ×3 IMPLANT
SUT SILK 3 0 TIES 10X30 (SUTURE) ×3 IMPLANT
SUT SILK 3-0 18XBRD TIE 12 (SUTURE) IMPLANT
SUT VIC AB 2-0 CT1 27 (SUTURE) ×6
SUT VIC AB 2-0 CT1 TAPERPNT 27 (SUTURE) ×6 IMPLANT
SUT VIC AB 3-0 SH 18 (SUTURE) IMPLANT
SUT VIC AB 3-0 SH 27 (SUTURE) ×6
SUT VIC AB 3-0 SH 27X BRD (SUTURE) ×6 IMPLANT
SYR MEDRAD MARK V 150ML (SYRINGE) ×1 IMPLANT
TOWEL GREEN STERILE (TOWEL DISPOSABLE) ×6 IMPLANT
TRAY FOLEY MTR SLVR 16FR STAT (SET/KITS/TRAYS/PACK) ×3 IMPLANT
UNDERPAD 30X36 HEAVY ABSORB (UNDERPADS AND DIAPERS) ×3 IMPLANT
WATER STERILE IRR 1000ML POUR (IV SOLUTION) ×3 IMPLANT

## 2022-04-26 LAB — TYPE AND SCREEN
ABO/RH(D): B NEG
Antibody Screen: NEGATIVE
Unit division: 0
Unit division: 0
Unit division: 0
Unit division: 0

## 2022-04-26 LAB — BPAM RBC
Blood Product Expiration Date: 202401122359
Blood Product Expiration Date: 202401122359
Blood Product Expiration Date: 202401142359
Blood Product Expiration Date: 202401142359
ISSUE DATE / TIME: 202401051404
ISSUE DATE / TIME: 202401051404
ISSUE DATE / TIME: 202401051404
ISSUE DATE / TIME: 202401061235
Unit Type and Rh: 9500
Unit Type and Rh: 9500
Unit Type and Rh: 9500
Unit Type and Rh: 9500

## 2022-04-28 ENCOUNTER — Encounter (HOSPITAL_COMMUNITY): Payer: Self-pay | Admitting: Surgery

## 2022-04-28 NOTE — Anesthesia Postprocedure Evaluation (Signed)
Anesthesia Post Note  Patient: Suzanne [name]  Procedure(s) Performed: EXPLORATORY LAPAROTOMY APPLICATION OF WOUND VAC (Abdomen)     Patient location during evaluation: ICU Anesthesia Type: General Level of consciousness: patient remains intubated per anesthesia plan and sedated Pain management: pain level controlled Vital Signs Assessment: post-procedure vital signs reviewed and stable Respiratory status: patient remains intubated per anesthesia plan Cardiovascular status: blood pressure returned to baseline Postop Assessment: no apparent nausea or vomiting Anesthetic complications: no  No notable events documented.  Last Vitals:  Vitals:   2022/04/29 2100 2022/04/29 2115  BP:    Pulse: 72 73  Resp: 16 16  Temp:    SpO2: 95% 95%    Last Pain:  Vitals:   April 29, 2022 1150  TempSrc: Axillary  PainSc: 10-Worst pain ever   Pain Goal: Patients Stated Pain Goal: 0 (04/24/22 2018)                 Suzanne [name]

## 2022-05-22 NOTE — Consult Note (Signed)
STAFF NOTE: I, Dr Ann Lions have personally reviewed patient's available data, including medical history, events of note, physical examination and test results as part of my evaluation. I have discussed with resident/NP and other care providers such as pharmacist, RN and RRT.  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  Status post angioplasty of superior mesenteric artery.  Then as of 04/23/2022 she is continuing to improve with flatus but no bowel movement.  She did get transfusion yesterday for hemoglobin of 6.8 g%.  She was on heparin infusion.  Then today 26-Apr-2022 was using PCA opioids, tolerating sips and flatus   Then late morning started having right foot pain being cold to touch with dusky cyanosis in lower palpable artery.  Started having abdominal pain again.  CT scan showed pneumatosis.  Started developing SIRS and shock features and critical care medicine consulted.  Went emergently to the operating room and is status post laparotomy with placement of ABThera wound VAC.  Intraoperatively found to have malodorous purulent tinged ascites small bowel was ischemic and eviscerated and over half of it necrotic including cecum and ascending colon most of the small bowel was unsalvageable.  Deemed to have a nonsurvivable insult in the setting of extensive vascular occlusion of the celiac, SMA and IMA and artery thrombosis and multiple peripheral vascular occlusions.  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    has a past surgical history that includes Cesarean section; Embolectomy (Right, 03/09/2020); Upper Extremity Angiography (Right, 05/28/2020); Upper Extremity Angiography (Right, 08/16/2020); VISCERAL ARTERY INTERVENTION (N/A, 10/24/2021); IR Perc Cholecystostomy (10/26/2021); Thrombectomy brachial artery (Right, 08/17/2020); Endarterectomy (Right, 08/17/2020); Bypass axilla/brachial artery (Right, 08/17/2020); IR CHOLANGIOGRAM EXISTING TUBE (12/13/2021); IR CHOLANGIOGRAM EXISTING TUBE (12/20/2021); laparotomy (N/A, 05/04/2022); Aortogram (Right, 05/18/2022); and Angioplasty (Right, 05/16/2022).   O:  Blood pressure 100/64, pulse 85, temperature (!) 97 F (36.1 C), temperature source Axillary, resp. rate (!) 30, height '5\' 7"'$  (1.702 m), weight 62.4 kg, last menstrual period 04/07/2022, SpO2 100 %.   I saw the patient before going to the OR.  This was on 6 E. floor.  She was in 45 degree and slightly confused right foot was cyanotic and cold/cool.  Pulses were less palpable on the right foot.  Abdomen was soft and slightly tender and distended.  She was slightly confused but not agitated.  Clear to auscultation bilaterally.  Blood pressure was soft   LABS    PULMONARY Recent Labs  Lab 05/20/2022 2311 26-Apr-2022 1423  PHART 7.375 7.054*  PCO2ART 37.4 39.0  PO2ART 87 107  HCO3 22.0 11.1*  TCO2 23 12*  O2SAT 97 96    CBC Recent Labs  Lab 04/23/22 0415 04/24/22 0130 04/24/22 1110 04-26-2022 0330 2022-04-26 1423  HGB 7.5* 6.8* 8.2* 7.8* 7.5*  HCT 25.0* 23.3* 26.4* 25.2* 22.0*  WBC 36.9* 30.3*  --  18.1*  --   PLT 427* 365  --  286  --     COAGULATION Recent Labs  Lab 04/22/22 0406  INR 1.3*    CARDIAC  No results for input(s): "TROPONINI" in the last 168 hours. No results for input(s): "PROBNP" in the last 168  hours.   CHEMISTRY Recent Labs  Lab 05/06/2022 2302 05/16/2022 2311 04/22/22 0406 04/22/22 1955 04/23/22 0415 2022-05-21 0330 05-21-22 1423  NA 132*   < > 133* 129* 131* 130* 133*  K 3.3*   < > 3.3* 4.0 4.4 4.5 4.7  CL 100  --  102 101 101 102  --   CO2 21*  --  22 19* 20* 17*  --   GLUCOSE 211*  --  146* 124* 113* 86  --   BUN 6  --  '8 16 19 16  '$ --   CREATININE 0.80  --  0.84 1.01* 0.94 0.83  --   CALCIUM 8.0*  --  8.4* 8.0* 7.8* 8.0*  --   MG  --   --  1.4*  --  2.5* 2.2  --    < > = values in this interval not displayed.   Estimated Creatinine Clearance: 83.2 mL/min (by C-G formula based on SCr of 0.83 mg/dL).   LIVER Recent Labs  Lab 05/08/2022 1047 04/22/22 0406 04/23/22 0415  AST 21 22 56*  ALT '9 10 29  '$ ALKPHOS 89 74 54  BILITOT 0.7 0.5 0.7  PROT 8.4* 7.0 5.3*  ALBUMIN 3.8 3.0* 2.2*  INR  --  1.3*  --      INFECTIOUS Recent Labs  Lab 04/22/22 0406 04/22/22 1955 04/23/22 0846  LATICACIDVEN 1.5 2.8* 1.8     ENDOCRINE CBG (last 3)  Recent Labs    05/21/2022 1344 2022/05/21 1405  GLUCAP <10* 134*         IMAGING x24h  - image(s) personally visualized  -   highlighted in bold CT ANGIO AO+BIFEM W & OR WO CONTRAST  Result Date: 2022/05/21 CLINICAL DATA:  Claudication Mesenteric ischemia Recent exploratory laparotomy and superior mesenteric artery angioplasty EXAM: CT ANGIOGRAPHY OF ABDOMINAL AORTA WITH ILIOFEMORAL RUNOFF TECHNIQUE: Multidetector CT imaging of the abdomen, pelvis and lower extremities was performed using the standard protocol during bolus administration of intravenous contrast. Multiplanar CT image reconstructions and MIPs were obtained to evaluate the vascular anatomy. RADIATION DOSE REDUCTION: This exam was performed according to the departmental dose-optimization program which includes automated exposure control, adjustment of the mA and/or kV according to patient size and/or use of iterative reconstruction technique. CONTRAST:  148m  OMNIPAQUE IOHEXOL 350 MG/ML SOLN COMPARISON:  04/22/2022 FINDINGS: VASCULAR Aorta: Low-density material again seen throughout the abdominal aorta, most likely a combination of noncalcified plaque and thrombus. The overall burden infrarenal abdominal aorta, just above the bifurcation which has progressed since the prior exam. Celiac: Unchanged occlusion of the celiac artery. SMA: Interval progression of narrowing at the origin of the superior mesenteric artery now with complete to near complete occlusion. There has been interval development of severe diffuse stenosis of the superior mesenteric artery which has progressed since prior examination. The distal SMA branches appear occluded. Renals: Mild narrowing of the proximal segment. No significant narrowing of the left main renal artery. IMA: Chronically occluded. RIGHT Lower Extremity Inflow: Right common, internal, and external iliac arteries are diminutive but patent. The overall diameter of these vessels have decreased since the prior examination. Outflow: Focal calcified plaque again noted in the common femoral  artery which appears moderately narrowed. Profundus femoris and superficial femoral arteries are diminutive but patent. Popliteal artery appears occluded. Runoff: Flow reconstitutes within the proximal anterior tibial and peroneal arteries. The anterior tibial artery opacifies to the distal lower leg. The peroneal artery continues to the level the ankle. No significant opacification of the posterior tibial artery. LEFT Lower Extremity Inflow: Common and external iliac arteries are diminutive but patent. 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 Outflow: Moderate stenosis of the left common femoral artery due to noncalcified plaque. The amount of plaque does not appear significantly increased but the overall diameter of the vessel has decreased since the prior exam. Profunda femoris artery is occluded. The superficial femoral artery is  diminutive but patent. Proximal popliteal artery is patent, however the distal popliteal artery is likely occluded. Runoff: There is reconstitution of flow within the posterior tibial artery to the level of the ankle. No definitive opacification of the anterior tibial and peroneal arteries. Veins: No obvious venous abnormality within the limitations of this arterial phase study. Review of the MIP images confirms the above findings. NON-VASCULAR Lower chest: Airspace opacity within the lingula suspicious for pneumonitis. Atelectasis involving the basilar segments. The superior segment is still aerated. Hepatobiliary: Known focal infarct in segment 2 better visualized on prior examination. Hepatic infarcts difficult to evaluate on the current examination due to single arterial phase. No new infarct is identified. Minimal perihepatic free air likely related to recent postop status. Gallbladder is unremarkable. Pancreas: No significant abnormality. Spleen: Chronically atrophied nodular splenic tissue again seen in the left upper quadrant, likely related to multiple prior infarcts. Adrenals/Urinary Tract: Adrenal glands are normal. Irregular thinning of the renal cortices bilaterally is unchanged from prior examination and likely due to prior ischemic insult. No hydronephrosis. Stomach/Bowel: Fluid within distal esophagus and small hiatal hernia is indicative of gastroesophageal reflux. Mild nonspecific thickening of the distal esophageal wall, most likely related to esophagitis, underlying mass is difficult to exclude. The stomach is fluid-filled and mildly dilated. Appendix is normal. No dilated loops of bowel to indicate ileus or obstruction. There is pneumatosis in the wall of the small bowel in the left lower quadrant, which is new since the prior CT angiography. Small amount of air also noted within the mesentery, adjacent to these small bowel loops. Scattered fat stranding seen within the abdomen most likely due  to recent postop status. Lymphatic: No enlarged abdominal or pelvic lymph nodes. Reproductive: Uterus and bilateral adnexa are unremarkable. Other: 7.6 x 3.1 x 4.0 cm ovoid hyperdense structure within the subcutaneous fat, underlying the mid abdominal staple line is new compared to prior CT angiography from 05/18/2022 and is consistent with hematoma. Additional hematoma also noted just deep to the right rectus sheath measuring 8.1 x 2.3 x 4.1 cm. Focal ascites noted in the central upper abdomen, best seen on image 66 of series 5 measuring 7.9 x 3.3 x 4.8 cm. Musculoskeletal: No acute or significant osseous findings. IMPRESSION: VASCULAR 1. Interval progression of narrowing at the origin of the superior mesenteric artery now with complete to near complete occlusion. There has been interval development of severe diffuse stenosis of the superior mesenteric artery which has progressed since prior examination. The distal SMA branches appear occluded. 2. 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 3. Interval progression of narrowing of the infrarenal abdominal aorta, just above the bifurcation, with near complete occlusion. 4. Interval progression of narrowing of the left common femoral artery. The overall diameter of the left  common femoral artery has decreased since the prior examination. 5. Occlusion of the distal left popliteal artery. 6. Right popliteal artery appears occluded. Flow reconstitutes within the proximal anterior tibial and peroneal arteries. The anterior tibial artery opacifies to the distal lower leg. The peroneal artery continues to the level the ankle. No significant opacification of the posterior tibial artery. NON-VASCULAR 1. Interval development of pneumatosis in the wall of the small bowel in the left lower quadrant. Small amount of air also noted within the mesentery, adjacent to these small bowel loops. Findings are suspicious for bowel ischemia. 2. Focal ascites noted in the central  upper abdomen measuring 7.9 x 3.3 x 4.8 cm. 3. Hematomas noted within the midline subcutaneous anterior abdomen underlying the staple line and in the right anterior abdomen underlying the right rectus sheath. 4. Airspace opacity within the lingula suspicious for pneumonitis. Atelectasis of significant portions of the right lower lobe. These results were called by telephone at the time of interpretation on 2022/05/03 at 11:55 AM to provider Gerri Lins PA-C, who verbally acknowledged these results. Electronically Signed   By: Miachel Roux M.D.   On: 2022/05/03 12:25      A: Circulatory shock Necrotic and ischemic bowel with perforation and peritonitis Nonsalvageable due to vascular ischemia Acute respiratory failure due to all of the above  P: Post ICU full ICU care but start to initiate goals of care  Terminal prognosis   Anti-infectives (From admission, onward)    Start     Dose/Rate Route Frequency Ordered Stop   2022-05-03 1412  vancomycin (VANCOCIN) 1-5 GM/200ML-% IVPB       Note to Pharmacy: Nena Polio: cabinet override      05/03/22 1412 04/26/22 0229        Rest per NP/medical resident whose note is outlined above and that I agree with  The patient is critically ill with multiple organ systems failure and requires high complexity decision making for assessment and support, frequent evaluation and titration of therapies, application of advanced monitoring technologies and extensive interpretation of multiple databases.   Critical Care Time devoted to patient care services described in this note is  30  Minutes. This time reflects time of care of this signee Dr Brand Males. This critical care time does not reflect procedure time, or teaching time or supervisory time of PA/NP/Med student/Med Resident etc but could involve care discussion time     Dr. Brand Males, M.D., [facility].C.P Pulmonary and Critical Care Medicine Staff Physician [place]  Pulmonary and Critical Care Pager: [phone], If no answer or between  15:00h - 7:00h: call [phone]  2022/05/03 3:42 PM

## 2022-05-22 NOTE — Anesthesia Preprocedure Evaluation (Addendum)
Anesthesia Evaluation  Patient identified by MRN, date of birth, ID band Patient awake    Reviewed: Allergy & Precautions, NPO status , Patient's Chart, lab work & pertinent test results  Airway Mallampati: II  TM Distance: >3 FB Neck ROM: Full    Dental  (+) Loose, Poor Dentition, Chipped, Missing   Pulmonary COPD,  COPD inhaler, Current SmokerPatient did not abstain from smoking.   + rhonchi        Cardiovascular hypertension, Pt. on medications + Peripheral Vascular Disease, + PND and + DVT   Rhythm:Regular Rate:Tachycardia  TTE 2022 1. Left ventricular ejection fraction, by estimation, is 65 to 70%. The  left ventricle has normal function. The left ventricle has no regional  wall motion abnormalities. Left ventricular diastolic parameters were  normal.   2. Right ventricular systolic function is normal. The right ventricular  size is normal.   3. The mitral valve is normal in structure. Trivial mitral valve  regurgitation. No evidence of mitral stenosis.   4. The aortic valve is normal in structure. Aortic valve regurgitation is  not visualized. No aortic stenosis is present.   5. The inferior vena cava is normal in size with greater than 50%  respiratory variability, suggesting right atrial pressure of 3 mmHg.     Neuro/Psych  Neuromuscular disease  negative psych ROS   GI/Hepatic ,GERD  ,,(+)     substance abuse  alcohol useMesenteric ischemia    Endo/Other  negative endocrine ROS    Renal/GU negative Renal ROS  negative genitourinary   Musculoskeletal negative musculoskeletal ROS (+)    Abdominal   Peds  Hematology  (+) Blood dyscrasia, anemia Lab Results      Component                Value               Date                      WBC                      18.1 (H)            May 20, 2022                HGB                      7.8 (L)             May 20, 2022                HCT                      25.2  (L)            May 20, 2022                MCV                      73.9 (L)            20-May-2022                PLT                      286                 05/20/2022  Lab Results      Component                Value               Date                      NA                       130 (L)             05-May-2022                K                        4.5                 May 05, 2022                CO2                      17 (L)              05-May-2022                GLUCOSE                  86                  2022/05/05                BUN                      16                  2022-05-05                CREATININE               0.83                05-05-2022                CALCIUM                  8.0 (L)             May 05, 2022                GFRNONAA                 >60                 05/05/2022              Anesthesia Other Findings S/p SMA balloon angioplasty/ex lap for acute mesenteric ischemia. Significant progression in abdominal pain, new onset right lower extremity pain earlier today concerning for embolic event.    Reproductive/Obstetrics                             Anesthesia Physical Anesthesia Plan  ASA: 4 and emergent  Anesthesia Plan: General   Post-op Pain Management:    Induction: Intravenous and Rapid sequence  PONV Risk Score and Plan: 2 and Midazolam and Treatment may vary due to age or medical condition  Airway Management Planned: Oral ETT  Additional Equipment: Arterial line, CVP and Ultrasound Guidance Line Placement  Intra-op Plan:  Post-operative Plan: Post-operative intubation/ventilation  Informed Consent: I have reviewed the patients History and Physical, chart, labs and discussed the procedure including the risks, benefits and alternatives for the proposed anesthesia with the patient or authorized representative who has indicated his/her understanding and acceptance.     Dental advisory given  Plan  Discussed with: CRNA  Anesthesia Plan Comments: (- CVC in situ Pt states she would like to be full code. )       Anesthesia Quick Evaluation

## 2022-05-22 NOTE — Consult Note (Signed)
NAME:  Suzanne [name], MRN:  [id number], DOB:  Feb 08, 1976, LOS: 4 ADMISSION DATE:  04/22/2022, CONSULTATION DATE: 05/01/22 REFERRING MD:  Stanford Breed - VVS CHIEF COMPLAINT:  Evolving shock    History of Present Illness:  Suzanne [name] is a 47 y.o. female with a PMH significant for aortic thrombus and splenic infarct who presented to the ED 04/29/2022 for complaints of abdominal pain that began 2 days prior to admission.  CTA abdomen on admission revealed worsening thrombosis and occlusive disease with elevated lactic acid above 3.  Of note patient has extensive vascular disease including need for above the elbow amputation and initiation of lifelong anticoagulation.  Patient was admitted per vascular surgery.   Patient seen with acute decompensation 1/5 including hypoxia and hypotension.  Upon arrival to room patient hypoxia improved after application of nonrebreather.  Vascular surgery plans to take patient urgently for revascularization.   Pertinent  Medical History  Aortic thrombus Splenic infarct  Significant Hospital Events: Including procedures, antibiotic start and stop dates in addition to other pertinent events   1/1 - Presented to [facility] with acute severe abdominal pain  CTA abdomen on admission revealed worsening thrombosis and occlusive disease with elevated lactic acid above 3. Transferred to [facility] for definitive management with VVS/CCS. 1/5 - PCCM consulted for decompensation on floor. Transfer to ICU for hypoxia and hypotension with acute concern for evolving sepsis in the setting of gut ischemia. Taken to OR emergently 1/5 (VVS/CCS)  Interim History / Subjective:  PCCM consulted for acute respiratory and hemodynamic decompensation on the floor Seen in acute pain, hypoxia improved with application of NRB Soft BP, slightly improved with LR bolus (782m, then 1L) Initiating Levophed Plan for transfer to ICU, emergent OR with VVS/CCS  Objective   Blood pressure (!) 126/59, pulse 82,  temperature (!) 97 F (36.1 C), temperature source Axillary, resp. rate 16, height '5\' 7"'$  (1.702 m), weight 62.4 kg, last menstrual period 04/07/2022, SpO2 92 %.    Vent Mode: PRVC FiO2 (%):  [55 %-80 %] 60 % Set Rate:  [16 bmp] 16 bmp Vt Set:  [490 mL] 490 mL PEEP:  [5 cmH20] 5 cmH20 Plateau Pressure:  [20 cmH20] 20 cmH20   Intake/Output Summary (Last 24 hours) at [id number] Last data filed at 1Jan 11, 20241600 Gross per 24 hour  Intake 4018.9 ml  Output 2100 ml  Net 1918.9 ml    Filed Weights   04/22/22 0500  Weight: 62.4 kg   Physical Examination: General: Acutely ill-appearing middle-aged woman in NAD. HEENT: [place]/AT, anicteric sclera, PERRL, moist mucous membranes. Neuro: Intubated, sedated. Does not respond to verbal, tactile or noxious stimuli. Not following commands. No spontaneous movement of extremities noted on exam. +Cough, +gag. CV: RRR, no m/g/r. PULM: Breathing even and unlabored on vent (PEEP 5, FiO2 60%). Lung fields CTAB. GI: Soft, AbThera WAcmein place postopratively. Absent bowel sounds. Extremities: No LE edema noted. Cool to touch bilaterally without palpable pulses on R. R foot mottled/cyanotic. Skin: Cool/dry, mild mottling of LE.  Resolved Hospital Problem List:     Assessment & Plan:  SMA thrombus with resultant mesenteric ischemia, recurrent Diffuse pan-gut ischemia in the setting of SMA, IMA, aortic thrombus [facility] 2 days. 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 CTA completed demonstrated worsening thrombus/occlusive disease with persistent aortic thrombus with SMA encroachment, occluded celiac/IMA, elevated LA. [facility], taken to  OR 1/1 for revascularization/angioplasty and ex-lap (no bowel necrosis). Heparin gtt started post-op. Initially progressed well postoperatively. Unfortunately, had acute onset of severe  R foot pain and mild recurrent abdominal pain 1/5. Decompensated on floor (hypoxic, hypotensive, mottled R foot with loss of doppler signal). PCCM consulted for ICU transfer. - Transferred to ICU for higher level of care - Taken emergently to OR with VVS Virl Cagey), CCS Kae Heller); intraoperative findings notable for diffuse bowel ischemia with involvement of both small and large intestines and malodorous, purulent ascites with bowel evisceration and necrosis; deemed nonsurvivable - Per patient's family, DNR code status with emphasis on patient comfort; no escalation of care - Will remain intubated/sedated on vent with PAD protocol - Transition to comfort measures once family has had an opportunity to visit - Chaplain, PMT if requested by family - Anticipate in-hospital death  Septic shock in the setting of diffuse gut ischemia - Goal MAP > 65 - Fluid resuscitation as tolerated - Continue Levophed titrated to goal  for now, plan to discontinue once family has been able to visit  Acute hypoxemic respiratory failure in the setting of sepsis, diffuse bowel ischemia - Continue full vent support (4-8cc/kg IBW) - Wean FiO2 for O2 sat > 90% - Extubate for comfort once family has had opportunity to visit with patient - VAP bundle - Pulmonary hygiene - PAD protocol for sedation: Propofol and Fentanyl for goal RASS -1 to -2  History of thromboembolic disease History of clotting disorder on Eliquis/Plavix, had not taken for a few days PTA S/p above-elbow R arm amputation - Heparin gtt for now - Discontinue once transitioning to comfort  Best Practice (right click and "Reselect all SmartList Selections" daily)   Diet/type: NPO DVT prophylaxis: systemic heparin GI prophylaxis: N/A Lines: Central line Foley:  N/A Code Status:  DNR Last date of multidisciplinary goals of care discussion 05/01/22 - Per VVS (Dr. Virl Cagey), patient initially full code but made it clear prior to surgery that she did not want  to suffer in any way. After catastrophic findings of pan-gut ischemia, decision made by patient's son(s) for DNR. Plan to transition to comfort measures once family visitation has occurred.]  Labs   CBC: Recent Labs  Lab 04/22/22 0406 04/22/22 1955 04/23/22 0415 04/24/22 0130 04/24/22 1110 01-May-2022 0330 05-01-22 1423  WBC 36.4* 39.6* 36.9* 30.3*  --  18.1*  --   HGB 9.2* 8.3* 7.5* 6.8* 8.2* 7.8* 7.5*  HCT 32.0* 28.5* 25.0* 23.3* 26.4* 25.2* 22.0*  MCV 72.4* 73.3* 71.6* 73.5*  --  73.9*  --   PLT 468* 445* 427* 365  --  286  --    Basic Metabolic Panel: Recent Labs  Lab 05/01/2022 2302 04/24/2022 2311 04/22/22 0406 04/22/22 1955 04/23/22 0415 May 01, 2022 0330 05-01-22 1423  NA 132*   < > 133* 129* 131* 130* 133*  K 3.3*   < > 3.3* 4.0 4.4 4.5 4.7  CL 100  --  102 101 101 102  --   CO2 21*  --  22 19* 20* 17*  --   GLUCOSE 211*  --  146* 124* 113* 86  --   BUN 6  --  '8 16 19 16  '$ --   CREATININE 0.80  --  0.84 1.01* 0.94 0.83  --   CALCIUM 8.0*  --  8.4* 8.0* 7.8* 8.0*  --   MG  --   --  1.4*  --  2.5* 2.2  --    < > = values  in this interval not displayed.   GFR: Estimated Creatinine Clearance: 83.2 mL/min (by C-G formula based on SCr of 0.83 mg/dL). Recent Labs  Lab 04/28/2022 2302 04/22/22 0406 04/22/22 1955 04/23/22 0415 04/23/22 0846 04/24/22 0130 05-15-2022 0330  WBC  --  36.4* 39.6* 36.9*  --  30.3* 18.1*  LATICACIDVEN 1.8 1.5 2.8*  --  1.8  --   --    Liver Function Tests: Recent Labs  Lab 05/06/2022 1047 04/22/22 0406 04/23/22 0415  AST 21 22 56*  ALT '9 10 29  '$ ALKPHOS 89 74 54  BILITOT 0.7 0.5 0.7  PROT 8.4* 7.0 5.3*  ALBUMIN 3.8 3.0* 2.2*    Recent Labs  Lab 05/16/2022 1047  LIPASE 31    No results for input(s): "AMMONIA" in the last 168 hours.  ABG:    Component Value Date/Time   PHART 7.054 (LL) 05-15-2022 1423   PCO2ART 39.0 05/15/2022 1423   PO2ART 107 2022-05-15 1423   HCO3 11.1 (L) 2022/05/15 1423   TCO2 12 (L) 05/15/2022 1423    ACIDBASEDEF 18.0 (H) May 15, 2022 1423   O2SAT 96 2022/05/15 1423    Coagulation Profile: Recent Labs  Lab 04/22/22 0406  INR 1.3*   Cardiac Enzymes: No results for input(s): "CKTOTAL", "CKMB", "CKMBINDEX", "TROPONINI" in the last 168 hours.  HbA1C: No results found for: "HGBA1C"  CBG: Recent Labs  Lab 05-15-2022 1344 05/15/2022 1405  GLUCAP <10* 134*   Review of Systems:   Patient is encephalopathic and/or intubated. Therefore history has been obtained from chart review.   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   Surgical History:   Past Surgical History:  Procedure Laterality Date   ANGIOPLASTY Right 05/07/2022   Procedure: BALLOON ANGIOPLASTY OF THE SUPERIOR MESENTERIC ARTERY;  Surgeon: Cherre Robins, MD;  Location: [place];  Service: Vascular;  Laterality: Right;   AORTOGRAM Right 04/23/2022   Procedure: AORTOGRAM;  Surgeon: Cherre Robins, MD;  Location: [place];  Service: Vascular;  Laterality: Right;   BYPASS AXILLA/BRACHIAL ARTERY Right 08/17/2020   Procedure: BYPASS AXILLA/BRACHIAL ARTERY;  Surgeon: Katha Cabal, MD;  Location: [facility];  Service: Vascular;  Laterality: Right;   CESAREAN SECTION     times 4   EMBOLECTOMY Right 03/09/2020   Procedure: Brachial EMBOLECTOMY;  Surgeon: Algernon Huxley, MD;  Location: [facility];  Service: Vascular;  Laterality: Right;   ENDARTERECTOMY Right 08/17/2020   Procedure: ENDARTERECTOMY BRACHIAL;  Surgeon: Katha Cabal, MD;  Location: [facility];  Service: Vascular;  Laterality: Right;   IR CHOLANGIOGRAM EXISTING TUBE  12/13/2021   IR CHOLANGIOGRAM EXISTING TUBE  12/20/2021   IR PERC CHOLECYSTOSTOMY  10/26/2021   LAPAROTOMY N/A 05/06/2022   Procedure: EXPLORATORY LAPAROTOMY;  Surgeon: Cherre Robins, MD;  Location: [place];  Service: Vascular;  Laterality: N/A;   THROMBECTOMY BRACHIAL ARTERY Right 08/17/2020    Procedure: THROMBECTOMY BRACHIAL ARTERY;  Surgeon: Katha Cabal, MD;  Location: [facility];  Service: Vascular;  Laterality: Right;   UPPER EXTREMITY ANGIOGRAPHY Right 05/28/2020   Procedure: UPPER EXTREMITY ANGIOGRAPHY;  Surgeon: Algernon Huxley, MD;  Location: [place] [facility];  Service: Cardiovascular;  Laterality: Right;   UPPER EXTREMITY ANGIOGRAPHY Right 08/16/2020   Procedure: UPPER EXTREMITY ANGIOGRAPHY;  Surgeon: Algernon Huxley, MD;  Location: [place] [facility];  Service: Cardiovascular;  Laterality: Right;   VISCERAL ARTERY INTERVENTION  N/A 10/24/2021   Procedure: VISCERAL ARTERY INTERVENTION;  Surgeon: Katha Cabal, MD;  Location: [place] [facility];  Service: Cardiovascular;  Laterality: N/A;   Social History:   reports that she has been smoking cigarettes. She has a 10.00 pack-year smoking history. She has never used smokeless tobacco. She reports that she does not currently use alcohol after a past usage of about 2.0 standard drinks of alcohol per week. She reports that she does not use drugs.   Family History:  Her family history includes Hypertension in her mother. She was adopted.   Allergies No Known Allergies   Home Medications  Prior to Admission medications   Medication Sig Start Date End Date Taking? Authorizing Provider  apixaban (ELIQUIS) 5 MG TABS tablet Take 1 tablet (5 mg total) by mouth 2 (two) times daily. 10/30/21 04/22/22 Yes Ezekiel Slocumb, DO  acetaminophen (TYLENOL) 325 MG tablet Take 2 tablets (650 mg total) by mouth every 6 (six) hours as needed for mild pain, fever or headache. Patient not taking: Reported on 04/22/2022 10/30/21 10/30/22  Nicole Kindred A, DO  albuterol (PROVENTIL HFA;VENTOLIN HFA) 108 (90 Base) MCG/ACT inhaler Inhale 2 puffs into the lungs every 6 (six) hours as needed for wheezing or shortness of breath. Patient not taking: Reported on 10/22/2021 11/21/16   Merlyn Lot, MD  amLODipine (NORVASC) 5 MG tablet Take 1 tablet (5 mg  total) by mouth daily. Patient not taking: Reported on 04/22/2022 10/31/21   Nicole Kindred A, DO  atorvastatin (LIPITOR) 80 MG tablet Take 1 tablet (80 mg total) by mouth daily. Patient not taking: Reported on 04/22/2022 10/31/21   Nicole Kindred A, DO  clopidogrel (PLAVIX) 75 MG tablet Take 1 tablet (75 mg total) by mouth daily with breakfast. Patient not taking: Reported on 04/22/2022 10/31/21   Ezekiel Slocumb, DO  Multiple Vitamin (MULTIVITAMIN WITH MINERALS) TABS tablet Take 1 tablet by mouth daily. Patient not taking: Reported on 04/22/2022 10/31/21   Ezekiel Slocumb, DO    Critical care time: 41 minutes   The patient is critically ill with multiple organ system failure and requires high complexity decision making for assessment and support, frequent evaluation and titration of therapies, advanced monitoring, review of radiographic studies and interpretation of complex data.   Critical Care Time devoted to patient care services, exclusive of separately billable procedures, described in this note is 41 minutes.  Lestine Mount, PA-C [facility] 24-May-2022 4:07 PM  Please see [email or web address] for pager details.  From 7A-7P if no response, please call [phone] After hours, please call ELink [phone]

## 2022-05-22 NOTE — Progress Notes (Signed)
ANTICOAGULATION CONSULT NOTE Pharmacy Consult for heparin Indication:  aortic/SMA thrombus   No Known Allergies  Patient Measurements: Height: '5\' 7"'$  (170.2 cm) Weight: 62.4 kg (137 lb 9.1 oz) IBW/kg (Calculated) : 61.6  Vital Signs: Temp: 97 F (36.1 C) 05/24/2022 1150) Temp Source: Axillary 05-24-22 1150) BP: 73/56 05/24/22 1241) Pulse Rate: 85 05/24/2022 1241)  Labs: Recent Labs    04/22/22 1350 04/22/22 1955 04/22/22 1955 04/23/22 0415 04/23/22 1522 04/24/22 0130 04/24/22 1110 04/24/22 1111 04/24/22 1830 May 24, 2022 0200 2022/05/24 0330 May 24, 2022 1000  HGB  --  8.3*   < > 7.5*  --  6.8* 8.2*  --   --   --  7.8*  --   HCT  --  28.5*   < > 25.0*  --  23.3* 26.4*  --   --   --  25.2*  --   PLT  --  445*   < > 427*  --  365  --   --   --   --  286  --   APTT 65*  --   --  44*  --   --   --   --   --   --   --   --   HEPARINUNFRC  --   --   --  0.11*   < > 0.24*  --    < > 0.27* 0.31  --  0.33  CREATININE  --  1.01*  --  0.94  --   --   --   --   --   --  0.83  --    < > = values in this interval not displayed.     Estimated Creatinine Clearance: 83.2 mL/min (by C-G formula based on SCr of 0.83 mg/dL).  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 She is on apixaban PTA for a hypercoagulable disorder (lifelong anticoagulation by hematology at [facility]) with last dose ~ 12/31. Heparin level is low on last check indicating little impact from recent apixaban. Utilizing heparin levels to monitor therapy.   Heparin level remains therapeutic on confirmatory check.  Goal of Therapy:  Heparin level 0.3-0.7 units/ml Monitor platelets by anticoagulation protocol: Yes   Plan:  -Continue heparin 1650 units/h -Daily HL CBC  Arrie Senate, PharmD, [place], [facility] Clinical Pharmacist [phone] Please check AMION for all [facility] Pharmacy numbers 05/24/22

## 2022-05-22 NOTE — Progress Notes (Signed)
Patient arrived to unit with PCA pump off but still attached to patient. Per verbal order from Nevada Crane, PA the PCA will be discontinued. This RN wasted 12 mL of morphine sulfate PCA syringe at this time with Cain Saupe, RN.

## 2022-05-22 NOTE — Op Note (Deleted)
Operative Note  KASIAH MANKA 47 y.o. female [id number]  05-23-22  Surgeon: Clovis Riley MD FACS  Assistant: Obie Dredge PA-C  Procedure performed: Laparoscopic Cholecystectomy  Procedure classification: urgent  Preop diagnosis: gallstone pancreatitis Post-op diagnosis/intraop findings: same, acute cholecystitis  Specimens: gallbladder  Retained items: none  EBL: minimal  Complications: none  Description of procedure: After obtaining informed consent the patient was brought to the operating room. Antibiotics were administered. SCD's were applied. General endotracheal anesthesia was initiated and a formal time-out was performed. The abdomen was prepped and draped in the usual sterile fashion and the abdomen was entered using  an infraumbilical Veress needle  after instilling the site with local. Insufflation to 64mHg was obtained, 537mtrocar and camera inserted, and gross inspection revealed no evidence of injury from our entry.  He was noted to have diffuse thin bilious tinged ascites.  The liver appears slightly enlarged and rounded. Two 54m654mrocars were introduced in the right midclavicular and right anterior axillary lines under direct visualization and following infiltration with local. An 17m84mocar was placed in the epigastrium. The gallbladder fundus was retracted cephalad and omental adhesions to the gallbladder were taken down bluntly and with cautery.  The infundibulum was retracted laterally. A combination of hook electrocautery and blunt dissection was utilized to clear the peritoneum from the neck and cystic duct, circumferentially isolating the cystic artery and cystic duct and lifting the gallbladder from the cystic plate. The critical view of safety was achieved with the cystic artery, cystic duct, and liver bed visualized between them with no other structures.  The cystic duct was palpated and there were no palpable stones in the lumen although it was somewhat  shortened.  The artery was clipped with a single clip proximally and distally and divided as was the cystic duct with two clips on the proximal end.  The clips were not large enough to completely cross the cystic duct and so ultimately 2-0 PDS Endoloops were placed just proximal to the clips with good apposition.  There was a posterior cystic artery branch coursing along the cystic duct which was clipped singly.  The gallbladder was dissected from the liver plate using electrocautery. Once freed the gallbladder was placed in an endocatch bag and removed intact through the epigastric trocar site. A small amount of bleeding on the liver bed was controlled with cautery.  Surgicel snow was placed.  We were able to evacuate a small amount of bilious ascites that was present prior to surgery.  The right upper quadrant was irrigated and aspirated, until the effluent was clear. Hemostasis was once again confirmed, and reinspection of the abdomen revealed no injuries. The clips were well opposed without any bile leak from the duct or the liver bed. The 17mm53mcar site in the epigastrium was closed with a 0 vicryl in the fascia under direct visualization using a PMI device. The abdomen was desufflated and all trocars removed. The skin incisions were closed with running subcuticular monocryl and Dermabond. The patient was awakened, extubated and transported to the recovery room in stable condition.    All counts were correct at the completion of the case.

## 2022-05-22 NOTE — Progress Notes (Addendum)
Physical Therapy Treatment Patient Details Name: Suzanne [name] MRN: [id number] DOB: [birth year] Today's Date: 05/14/2022   History of Present Illness [name] is a 47 y.o. 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 SMA angioplasty and exploratory laparotomy 1/1. PMHx: aortic thrombus, CRPS, splenic infarct, DVT, RUE amputation.    PT Comments    Pt remains limited by pain in her abdomen and now new pain in her R lower extremity. She reported her R leg "fell asleep" ~45 min prior to PT arrival. Sitting up EOB initially reducing some of the numbness but then it worsened again. Noted R foot drop and decreased strength in R anterior tibialis compared to L with her R lower leg being cold to the touch. She did not appear to have any facial droop or changes in speech, but was concerned about her R leg suddenly hurting, being cold to the touch, weak, and numb so notified RN and MD. Pt self-limiting to only ambulating ~15 ft due to her abdominal and R leg pain today. She displayed increased instability due to her R leg numbness, weakness, and pain, thus needed close min guard assist for safety. Pt unable to tolerate sitting up in recliner, thus returned to bed end of session. BP decreased to 80/52 after walking but was 95/50 semi-supine end of session. Will continue to follow acutely. Current recommendations remain appropriate.     Recommendations for follow up therapy are one component of a multi-disciplinary discharge planning process, led by the attending physician.  Recommendations may be updated based on patient status, additional functional criteria and insurance authorization.  Follow Up Recommendations  No PT follow up     Assistance Recommended at Discharge PRN  Patient can return home with the following Assistance with cooking/housework;Assist for transportation;Help with stairs or ramp for entrance;A little help with  walking and/or transfers   Equipment Recommendations  None recommended by PT    Recommendations for Other Services       Precautions / Restrictions Precautions Precautions: Fall;Other (comment) Precaution Comments: PCA, RUE amputation, watch BP Restrictions Weight Bearing Restrictions: No     Mobility  Bed Mobility Overal bed mobility: Needs Assistance Bed Mobility: Supine to Sit, Sit to Supine     Supine to sit: Supervision, HOB elevated Sit to supine: Supervision, HOB elevated   General bed mobility comments: HOB elevated and pillows behind pt to reduce abdominal pain with supine <> sit transition, supervision for safety and line management.    Transfers Overall transfer level: Needs assistance Equipment used: None Transfers: Sit to/from Stand, Bed to chair/wheelchair/BSC Sit to Stand: Min guard   Step pivot transfers: Min guard       General transfer comment: Pt with wide BOS and trunk sway, min guard for safety    Ambulation/Gait Ambulation/Gait assistance: Min guard Gait Distance (Feet): 15 Feet (x2 bouts of ~15 ft > ~3 ft) Assistive device: None Gait Pattern/deviations: Step-through pattern, Decreased stride length, Wide base of support, Decreased dorsiflexion - right Gait velocity: reduced Gait velocity interpretation: <1.31 ft/sec, indicative of household ambulator   General Gait Details: Pt with slow gait, wide BOS, and trunk sway with noted R foot drop/drag and pt reporting R leg was "asleep". Min guard for safety   Stairs             Wheelchair Mobility    Modified Rankin (Stroke Patients Only)       Balance Overall balance  assessment: Mild deficits observed, not formally tested                                          Cognition Arousal/Alertness: Awake/alert Behavior During Therapy: WFL for tasks assessed/performed Overall Cognitive Status: Within Functional Limits for tasks assessed                                  General Comments: Distracted by pain        Exercises      General Comments General comments (skin integrity, edema, etc.): BP decreased to 80/52 after walking but was 95/50 semi-supine end of session; pt reporting R leg "fell asleep" about 45 min prior to PT session. Sitting up EOB helped initially but then it worsened again and noted foot drop during session. MMT scores of 3- in her anterior tibialis on the R compared to a MMT of 5 on the L. R leg cold to palpation below the knee compared to the L. MD and RN notified; pleth unreliable reading in 70s% on RA at times, needing 4L with SpO2 raingin in 80s-90s%      Pertinent Vitals/Pain Pain Assessment Pain Assessment: Faces Faces Pain Scale: Hurts whole lot Pain Location: abdomen, R leg Pain Descriptors / Indicators: Discomfort, Grimacing, Operative site guarding, Moaning Pain Intervention(s): Limited activity within patient's tolerance, Monitored during session, Repositioned, PCA encouraged    Home Living                          Prior Function            PT Goals (current goals can now be found in the care plan section) Acute Rehab PT Goals Patient Stated Goal: less pain PT Goal Formulation: With patient Time For Goal Achievement: 05/07/22 Potential to Achieve Goals: Good Progress towards PT goals: Not progressing toward goals - comment (decline in function with R leg numbness)    Frequency    Min 3X/week      PT Plan Current plan remains appropriate    Co-evaluation              AM-PAC PT "6 Clicks" Mobility   Outcome Measure  Help needed turning from your back to your side while in a flat bed without using bedrails?: A Little Help needed moving from lying on your back to sitting on the side of a flat bed without using bedrails?: A Little Help needed moving to and from a bed to a chair (including a wheelchair)?: A Little Help needed standing up from a chair using your arms  (e.g., wheelchair or bedside chair)?: A Little Help needed to walk in hospital room?: A Little Help needed climbing 3-5 steps with a railing? : A Little 6 Click Score: 18    End of Session Equipment Utilized During Treatment: Oxygen Activity Tolerance: Patient limited by pain Patient left: in bed;with call bell/phone within reach;with bed alarm set Nurse Communication: Mobility status;Other (comment) (R leg pain, numbness, and cold to touch) PT Visit Diagnosis: Difficulty in walking, not elsewhere classified (R26.2);Pain;Unsteadiness on feet (R26.81);Other abnormalities of gait and mobility (R26.89);Muscle weakness (generalized) (M62.81) Pain - Right/Left: Right Pain - part of body: Leg (& abdomen)     Time: 3267-1245 PT Time Calculation (min) (ACUTE ONLY): 44 min  Charges:  $  Gait Training: 8-22 mins $Therapeutic Activity: 23-37 mins                     Moishe Spice, PT, DPT Acute Rehabilitation Services  Office: [place] 2022-05-11, 11:01 AM

## 2022-05-22 NOTE — Transfer of Care (Signed)
Immediate Anesthesia Transfer of Care Note  Patient: Suzanne [name]  Procedure(s) Performed: EXPLORATORY LAPAROTOMY APPLICATION OF WOUND VAC (Abdomen)  Patient Location: ICU  Anesthesia Type:General  Level of Consciousness: Patient remains intubated per anesthesia plan  Airway & Oxygen Therapy: Patient remains intubated per anesthesia plan and Patient placed on Ventilator (see vital sign flow sheet for setting)  Post-op Assessment: Report given to RN and Post -op Vital signs reviewed and stable  Post vital signs: Reviewed and stable  Last Vitals:  Vitals Value Taken Time  BP 110/56   Temp    Pulse 89   Resp 14   SpO2 100     Last Pain:  Vitals:   April 28, 2022 1150  TempSrc: Axillary  PainSc: 10-Worst pain ever      Patients Stated Pain Goal: 0 (59/13/68 5992)  Complications: No notable events documented.

## 2022-05-22 NOTE — Op Note (Signed)
Operative Note  Suzanne [name]  [id number]  [id number]  05/18/22   Surgeon: Romana Juniper MD FACS   Assistant: Obie Dredge PA-C   Procedure performed: Exploratory laparotomy, placement of ABThera wound VAC   Preop diagnosis: Ischemic bowel Post-op diagnosis/intraop findings: Extensive bowel ischemia with long segments of bowel necrosis   Specimens: no Retained items: no  EBL: minimalcc Complications: none   Description of procedure: After obtaining informed consent the patient was taken to the operating room and placed supine on operating room table where general endotracheal anesthesia was initiated, preoperative antibiotics were administered, SCDs applied, and a formal timeout was performed.  The abdomen and bilateral lower extremities were prepped and draped in usual sterile fashion.  The patient's staples were removed and her fascia reopened from the previous closure 4 days ago.  The laparotomy was extended cephalad and caudad.  Immediately on entry we encountered malodorous, purulent blood-tinged ascites which was evacuated.  The omentum was reflected cephalad and the small bowel eviscerated.  This was almost all ischemic and over half of this was frankly necrotic, as well as the cecum and ascending colon.  There was perhaps 40 cm of small bowel from the ligament of Treitz that may be salvageable, but the remainder of her small bowel as well as the right colon were unsalvageable.  This was deemed to be a nonsurvivable insult, especially in the setting of patient's extensive vascular occlusion including celiac, SMA and IMA occlusion, aortic thrombosis and multiple peripheral vascular occlusions.  I did confer with Dr. Unk Lightning from vascular surgery who was scrubbed and present for this procedure and his opinion was the same.  At this point, an ABThera wound VAC was placed and the patient will be taken to the ICU intubated and in critical condition with the expectation that comfort  care will be initiated and mortality is expected.      All counts were correct at the completion of the case.

## 2022-05-22 NOTE — Progress Notes (Signed)
   2022/05/22 1150  Assess: MEWS Score  Temp (!) 97 F (36.1 C)  BP (!) 86/44  MAP (mmHg) (!) 58  Pulse Rate 92  ECG Heart Rate 91  Resp (!) 30  Level of Consciousness Alert  SpO2 91 %  O2 Device Nasal Cannula  O2 Flow Rate (L/min) 6 L/min  Assess: MEWS Score  MEWS Temp 0  MEWS Systolic 1  MEWS Pulse 0  MEWS RR 2  MEWS LOC 0  MEWS Score 3  MEWS Score Color Yellow  Assess: if the MEWS score is Yellow or Red  Were vital signs taken at a resting state? Yes  Focused Assessment No change from prior assessment  Does the patient meet 2 or more of the SIRS criteria? No  Does the patient have a confirmed or suspected source of infection? No  Provider and Rapid Response Notified? Yes  MEWS guidelines implemented *See Row Information* Yes  Treat  Pain Scale 0-10  Pain Score 10  Notify: Charge Nurse/RN  Name of Charge Nurse/RN Notified Judson Roch  Date Charge Nurse/RN Notified 2022-05-22  Time Charge Nurse/RN Notified 1150  Provider Notification  Provider Name/Title Leontine Locket  Date Provider Notified 05-22-22  Time Provider Notified 1150  Method of Notification Face-to-face  Notification Reason Change in status  Provider response At bedside  Date of Provider Response 2022-05-22  Time of Provider Response 1150  Notify: Rapid Response  Name of Rapid Response RN Notified [place]  Date Rapid Response Notified 05/22/2022  Time Rapid Response Notified 1150  Assess: SIRS CRITERIA  SIRS Temperature  0  SIRS Pulse 1  SIRS Respirations  1  SIRS WBC 1  SIRS Score Sum  3

## 2022-05-22 NOTE — Anesthesia Procedure Notes (Signed)
Arterial Line Insertion Start/End08-Jan-2024 2:12 PM Performed by: Josephine Igo, CRNA, CRNA  Patient location: Pre-op. Preanesthetic checklist: patient identified, IV checked, site marked, risks and benefits discussed, surgical consent, monitors and equipment checked, pre-op evaluation, timeout performed and anesthesia consent Lidocaine 1% used for infiltration Left, radial was placed Catheter size: 20 G Hand hygiene performed  and maximum sterile barriers used   Attempts: 1 Procedure performed without using ultrasound guided technique. Following insertion, dressing applied and Biopatch. Post procedure assessment: normal and unchanged  Patient tolerated the procedure well with no immediate complications.

## 2022-05-22 NOTE — Anesthesia Procedure Notes (Addendum)
Procedure Name: Intubation Date/Time: 05-23-22 2:16 PM  Performed by: Inda Coke, CRNAPre-anesthesia Checklist: Patient identified, Emergency Drugs available, Suction available, Timeout performed and Patient being monitored Patient Re-evaluated:Patient Re-evaluated prior to induction Oxygen Delivery Method: Circle system utilized Preoxygenation: Pre-oxygenation with 100% oxygen Induction Type: IV induction, Rapid sequence and Cricoid Pressure applied Laryngoscope Size: Mac and 3 Grade View: Grade I Tube type: Oral Tube size: 8.0 mm Airway Equipment and Method: Stylet Placement Confirmation: ETT inserted through vocal cords under direct vision, positive ETCO2, CO2 detector and breath sounds checked- equal and bilateral Secured at: 21 cm Tube secured with: Tape Dental Injury: Teeth and Oropharynx as per pre-operative assessment

## 2022-05-22 NOTE — Progress Notes (Signed)
Aldona Bar, [place] with vascular notified about patient complaining of right foot pain and it being cold to touch. Unable to doppler a pulse. She says someone will come to see the patient.

## 2022-05-22 NOTE — Progress Notes (Addendum)
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    She presented to the ED with new onset abdominal pain on Monday and underwent balloon angioplasty of her superior mesenteric artery.  She was progressing appropriately with a decreasing white blood cell count and improving symptoms until this morning.  Midmorning, [name] complained of acute right lower extremity pain in the foot with accompanying sensory deficit.  At the same time, she complained of severe abdominal pain.  She was sent to CT scan which demonstrated reocclusion of her superior mesenteric artery with thrombus extending down the artery.  All of this occurred while on heparin.  She was booked for emergency surgery.  Exploratory laparotomy demonstrated diffuse bowel ischemia involving both the small and large intestines.  This was deemed nonsurvivable by both myself and Dr. Windle Guard.  I have discussed the above with family. Initially, Suzanne [name] was full code at her own request, but also noted she did not want to suffer.  Both of her sons have taken this into account and have asked that she be DO NOT RESUSCITATE as the ischemic injury is not survivable..  Plan will likely be for comfort measures once family makes it to her bedside.  Please keep intubated and sedated. Appreciate care from nursing, ICU and general surgery.  Broadus John MD

## 2022-05-22 NOTE — Progress Notes (Signed)
  Daily Progress Note  S/p: SMA balloon angioplasty, exlap for acute mesenteric ischemia  Subjective: Patient with significant progression in abdominal pain, new onset right lower extremity pain earlier today  Objective: Vitals:   May 07, 2022 1224 05/07/2022 1241  BP: 99/72 (!) 73/56  Pulse: 86 85  Resp:    Temp:    SpO2: (!) 89% 91%    Physical Examination In extremis Uncomfortable Tachycardic Poor oxygenation Sensory deficits in the right foot with color changes, no signal   ASSESSMENT/PLAN:  Patient status post SMA balloon angioplasty/ex lap for acute mesenteric ischemia Significant progression in abdominal pain, new onset right lower extremity pain earlier today concerning for embolic event. CT angio abdomen pelvis with runoff obtained with increased thrombotic burden throughout the aorta.  This continues into the superior mesenteric artery, the terminal aorta is nearly occluded.  Unable to define the level of thrombus in the right lower extremity.  Patient was assessed, and extremis, requiring emergent operating room Plan for exploratory laparotomy with general surgery to assess for bowel as there was pneumatosis on the CT scan Should there be viable bowel, I will move forward with attempted revascularization-plan pending exploratory laparotomy The right leg will be the second priority.  This will require left lower extremity access, aortogram, likely suction thrombectomy of the terminal aorta.  Pending aortogram findings, possible cutdown possible suction thrombectomy.  Patient's son Ovid Curd was called.  Suzanne [name] has a very high risk of mortality with her current clinical condition She was okay pursuing surgery in effort to save her life.   Cassandria Santee MD MS [facility] [phone] 05/07/22  1:34 PM

## 2022-05-22 NOTE — Progress Notes (Signed)
ANTICOAGULATION CONSULT NOTE - Follow Up Consult  Pharmacy Consult for heparin Indication:  aortic/SMA thrombus  Labs: Recent Labs    04/22/22 1350 04/22/22 1955 04/22/22 1955 04/23/22 0415 04/23/22 1522 04/24/22 0130 04/24/22 1110 04/24/22 1111 04/24/22 1830 2022/05/25 0200 2022-05-25 0330  HGB  --  8.3*   < > 7.5*  --  6.8* 8.2*  --   --   --  7.8*  HCT  --  28.5*   < > 25.0*  --  23.3* 26.4*  --   --   --  25.2*  PLT  --  445*   < > 427*  --  365  --   --   --   --  286  APTT 65*  --   --  44*  --   --   --   --   --   --   --   HEPARINUNFRC  --   --   --  0.11*   < > 0.24*  --  0.23* 0.27* 0.31  --   CREATININE  --  1.01*  --  0.94  --   --   --   --   --   --  0.83   < > = values in this interval not displayed.    Assessment/Plan:  47yo female therapeutic on heparin after rate changes. Will continue infusion at current rate of 1650 units/hr and confirm stable with additional level.     Wynona Neat, PharmD, BCPS  05-25-22,5:10 AM

## 2022-05-22 NOTE — Progress Notes (Addendum)
[facility] Progress Note  4 Days Post-Op  Subjective: CC:  Patient just returned from CT.  She c/o abdominal pain and feeling hot - asking for cold water.    Objective: Vital signs in last 24 hours: Temp:  [97 F (36.1 C)-97.9 F (36.6 C)] 97 F (36.1 C) 05/08/22 1150) Pulse Rate:  [88-101] 88 May 08, 2022 1208) Resp:  [18-34] 34 May 08, 2022 1208) BP: (86-141)/(44-91) 95/44 05-08-2022 1155) SpO2:  [90 %-99 %] 93 % 2022-05-08 1208) FiO2 (%):  [55 %] 55 % 2022/05/08 1208) Last BM Date : 04/23/22  Intake/Output from previous day: 01/04 0701 - 05/08/22 0700 In: 2613.3 [P.O.:240; I.V.:1943.3; Blood:315; IV Piggyback:115] Out: 500 [Urine:500] Intake/Output this shift: No intake/output data recorded.  PE: Gen:  Alert, ill appearing, in distress Card:  Regular rate and rhythm, pedal pulses 2+ BL Pulm:  labored respirations on nasal cannula, tachypnea  Abd: Soft, mild distention, tender to palpation with guarding, no rebound tenderness  Skin: warm and dry, no rashes  Psych: A&Ox3 -person, hospital, surgery, 2024  Lab Results:  Recent Labs    04/24/22 0130 04/24/22 1110 05-08-22 0330  WBC 30.3*  --  18.1*  HGB 6.8* 8.2* 7.8*  HCT 23.3* 26.4* 25.2*  PLT 365  --  286   BMET Recent Labs    04/23/22 0415 2022-05-08 0330  NA 131* 130*  K 4.4 4.5  CL 101 102  CO2 20* 17*  GLUCOSE 113* 86  BUN 19 16  CREATININE 0.94 0.83  CALCIUM 7.8* 8.0*   PT/INR No results for input(s): "LABPROT", "INR" in the last 72 hours. CMP     Component Value Date/Time   NA 130 (L) 05/08/2022 0330   NA 140 04/23/2011 1538   K 4.5 05/08/2022 0330   K 3.9 04/23/2011 1538   CL 102 May 08, 2022 0330   CL 105 04/23/2011 1538   CO2 17 (L) 05/08/2022 0330   CO2 25 04/23/2011 1538   GLUCOSE 86 May 08, 2022 0330   GLUCOSE 117 (H) 04/23/2011 1538   BUN 16 2022-05-08 0330   BUN 2 (L) 04/23/2011 1538   CREATININE 0.83 05-08-22 0330   CREATININE 0.63 04/23/2011 1538   CALCIUM 8.0 (L) May 08, 2022 0330    CALCIUM 8.9 04/23/2011 1538   PROT 5.3 (L) 04/23/2022 0415   PROT 7.9 04/23/2011 1538   ALBUMIN 2.2 (L) 04/23/2022 0415   ALBUMIN 3.5 04/23/2011 1538   AST 56 (H) 04/23/2022 0415   AST 21 04/23/2011 1538   ALT 29 04/23/2022 0415   ALT 17 04/23/2011 1538   ALKPHOS 54 04/23/2022 0415   ALKPHOS 73 04/23/2011 1538   BILITOT 0.7 04/23/2022 0415   BILITOT 0.4 04/23/2011 1538   GFRNONAA >60 08-May-2022 0330   GFRNONAA >60 04/23/2011 1538   GFRAA >60 12/22/2018 0839   GFRAA >60 04/23/2011 1538   Lipase     Component Value Date/Time   LIPASE 31 05/21/2022 1047   LIPASE 98 04/23/2011 1538       Studies/Results: DG CHEST PORT 1 VIEW  Result Date: 04/24/2022 CLINICAL DATA:  Postoperative state EXAM: PORTABLE CHEST 1 VIEW COMPARISON:  Portable exam 0932 hours compared to 10/22/2021 FINDINGS: RIGHT jugular line with tip projecting over SVC. Normal heart size, mediastinal contours, and pulmonary vascularity. Linear subsegmental atelectasis in lingula with additional subsegmental atelectasis LEFT lower lobe. Remaining lungs clear without pulmonary infiltrate, pleural effusion or pneumothorax. Osseous demineralization IMPRESSION: LEFT lung atelectasis as above. Otherwise negative exam. Electronically Signed   By: Crist Infante.D.  On: 04/24/2022 10:08    Anti-infectives: Anti-infectives (From admission, onward)    None        Assessment/Plan  POD#4 s/p exploratory laparotomy for Mesenteric ischemia 1/1 Dr. Kieth Brightly   - s/p Angioplasty of superior mesenteric artery 1/1 Dr. Stanford Breed  - intra-op findings: no bowel necrosis  - re-imaged today and CT scan shows bowel pneumatosis as well as re-occlusion of SMA. She is ill appearing - tachypneic, increased work of breathing, new O2 requirement, systolic pressure 80, and is now guarding on abdominal exam. Will discuss with vascular surgery what their plans are but she warrants emergent laparotomy for bowel necrosis.   - discussed surgery with  the patient who is A&Ox4. Discussed concern for mesenteric ischemia with bowel necrosis. Recommend emergent exploratory laparotomy to further evaluate. Discussed possibility of bowel resection, open abdomen. Explained that, in circumstances where there is diffuse bowel necrosis, no resection is performed and her abdomen will be closed, knowing it is a non-survivable event. Discussed the risks of surgery including bleeding ,infection, damage to surrounding structures, need for additional surgeries, cardiac events, pulmonary complications and death. Patient would like to proceed with surgery.  I attempted to call the patients daughter, Lilia Pro, at the patients request. Went straight to voicemail, which was full. [phone] Pro    LOS: 4 days   I reviewed nursing notes, Consultant vascular surgery notes, last 24 h vitals and pain scores, last 48 h intake and output, last 24 h labs and trends, and last 24 h imaging results.  This care required high  level of medical decision making.   Obie Dredge, PA-C [facility] Please see Amion for pager number during day hours 7:00am-4:30pm

## 2022-05-22 NOTE — Progress Notes (Addendum)
[facility]  Subjective  - slow healing still having pain that requires PCA   Objective 125/73 98 (!) 97.4 F (36.3 C) (Axillary) 19 90%  Intake/Output Summary (Last 24 hours) at 05-06-22 0730 Last data filed at 2022-05-06 0404 Gross per 24 hour  Intake 2542.74 ml  Output 500 ml  Net 2042.74 ml    Feet warm well perfused Abdomin compressible, no BS to auscultation, flatus per patient Dressing intact over abdominal incision minmal bloody drainage Lungs non labored   Narrative & Impression  CLINICAL DATA:  Postoperative state   EXAM: PORTABLE CHEST 1 VIEW   COMPARISON:  Portable exam 0932 hours compared to 10/22/2021   FINDINGS: RIGHT jugular line with tip projecting over SVC.   Normal heart size, mediastinal contours, and pulmonary vascularity.   Linear subsegmental atelectasis in lingula with additional subsegmental atelectasis LEFT lower lobe.   Remaining lungs clear without pulmonary infiltrate, pleural effusion or pneumothorax.   Osseous demineralization   IMPRESSION: LEFT lung atelectasis as above.    Assessment/Planning: POD # 4  PROCEDURE:   1) Ultrasound guided right common femoral artery access 2) Aortogram (37m total contrast) 3) Angioplasty of superior mesenteric artery (6x25mMustang) 4) exploratory laparotomy (per Dr. 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   Tolerating sips, passing flatus per patient, no BS to auscultation this am.   Chest x ray atelectasis, encourage deep breathing HGB 7.8 post 1 unit PRBC 04/24/21 will observe Good Urine OP, Cr WNL Leukocytosis improved   EmRoxy Horseman/05-06-2022:30 AM --  VASCULAR STAFF ADDENDUM: I have independently interviewed and examined the patient. I agree with the above.  Can advance to clears today. Overall states she feels better.  Pull central line. Stop  fluids.    J.Cassandria SanteeMD [facility] Number: [phone]/May 06, 2022:20 AM    Laboratory Lab Results: Recent Labs    04/24/22 0130 04/24/22 1110 0101/16/2024330  WBC 30.3*  --  18.1*  HGB 6.8* 8.2* 7.8*  HCT 23.3* 26.4* 25.2*  PLT 365  --  286   BMET Recent Labs    04/23/22 0415 01January 16, [id number]  NA 131* 130*  K 4.4 4.5  CL 101 102  CO2 20* 17*  GLUCOSE 113* 86  BUN 19 16  CREATININE 0.94 0.83  CALCIUM 7.8* 8.0*    COAG Lab Results  Component Value Date   INR 1.3 (H) 04/22/2022   INR 1.1 10/22/2021   INR 1.1 08/17/2020   No results found for: "PTT"

## 2022-05-22 NOTE — Procedures (Signed)
Extubation Procedure Note  Patient Details:   Name: Suzanne [name] DOB: 06-Jul-1975 MRN: [id number]   Airway Documentation:    Vent end date: 14-May-2022 Vent end time: 2127   Evaluation  O2 sats: currently acceptable Complications: No apparent complications Patient did tolerate procedure well. Bilateral Breath Sounds: Diminished (Coarse)   No  Quentin Ore 05/14/22, 9:55 PM

## 2022-05-22 NOTE — TOC Progression Note (Signed)
Transition of Care [facility]) - Progression Note    Patient Details  Name: Suzanne [name] MRN: [id number] Date of Birth: [birth year]  Transition of Care [facility]) CM/SW Contact  Graves-Bigelow, Ocie Cornfield, RN Phone Number: 2022-05-04, 1:00 PM  Clinical Narrative:  Patient was discussed in progression rounds. Patient is POD-4 Exploratory lap for mesenteric ischemia. Patient continues on IV Heparin, Robaxin and PCA Morphine. Case Manager will continue to follow for transition of care needs as the patient progresses.   Expected Discharge Plan: Home/Self Care Barriers to Discharge: Continued Medical Work up  Expected Discharge Plan and Services   Discharge Planning Services: CM Consult   Living arrangements for the past 2 months: Mobile Home                 Social Determinants of Health (SDOH) Interventions SDOH Screenings   Food Insecurity: No Food Insecurity (04/23/2022)  Housing: Low Risk  (04/23/2022)  Transportation Needs: No Transportation Needs (04/23/2022)  Utilities: Not At Risk (04/23/2022)  Tobacco Use: High Risk (04/23/2022)    Readmission Risk Interventions     No data to display

## 2022-05-22 NOTE — Progress Notes (Signed)
Patient transferred to ICU with all belongings including phone and cell phone. Son made aware of patient's transfer. Receiving RN at bedside. Morphine PCA still attached to patient but turned off due to patient's LOC and B/P.

## 2022-05-22 NOTE — Significant Event (Signed)
Rapid Response Event Note   Reason for Call :  Hypotension 86/44 Tachypnea RR 30-36 Lethargy  Pt with mesenteric ischemia s/p angioplasty. To date, she has general complaint of abdominal pain. Today, she had new onset right leg and foot pain. CTA completed.   Initial Focused Assessment:  Pt with notable tachypnea. Taking deep breaths. Upon auscultation she is noted to have end inspiration wheezing and end expiratory wheezing. CXR yesterday shows atelectasis. Pt currently with 9/10 pain to both her right leg and stomach. She is arousable but does fall asleep during my time at the bedside. She is oriented x4. Skin is warm, moist, pink. Lower extremities are cool to touch, R>L.   VS: T 3F, BP 86/44 (58), HR 91, RR 30, SpO2 91% on 6LNC  Interventions:  -Albuterol 2.'5mg'$  neb given - expiratory wheeze only noted to right lower lobe following treatment -250 cc LR bolus for hypotension - pt with LR infusing, LR bolus given  Plan of Care:  -Pending OR -LA pending  Event Summary:  MD Notified: Bailey Mech, PA and Ileana Roup, PA Call Time: [place] Time: 1125 End Time: [place], RN

## 2022-05-22 DEATH — deceased

## 2022-06-20 NOTE — Discharge Summary (Signed)
DEATH SUMMARY   Patient Details  Name: Suzanne [name] MRN: [id number] DOB: [birth year]  Admission/Discharge Information   Admit Date:  May 01, 2022  Date of Death: Date of Death: May 05, 2022  Time of Death: Time of Death: 07/12/34  Length of Stay: 4  Referring Physician: System, Provider Not In   Reason(s) for Hospitalization  Patient is a 47 year old woman admitted to the hospital for acute mesenteric ischemia.   Diagnoses  Preliminary cause of death:  Secondary Diagnoses (including complications and co-morbidities):  Principal Problem:   Occlusion of celiac artery Active Problems:   Acute mesenteric ischemia [facility])   Brief Hospital Course (including significant findings, care, treatment, and services provided and events leading to death)  Suzanne [name] is a 47 y.o. year old female who was admitted to the hospital for acute mesenteric ischemia.  She was taken to the operating room for exploratory laparotomy and angioplasty of the superior mesenteric artery.  Good technical result was achieved.  The patient was hospitalized and progressing appropriately.  She has sudden deterioration on the morning of May 05, 2022.  She return to the operating room for repeat laparotomy.  There is no viable small bowel.  CT angiogram showed complete occlusion of the mesenteric vessels.  Discussion was had with the family.  Comfort measures were initiated.  The patient expired later that day.    Pertinent Labs and Studies  Significant Diagnostic Studies CT ANGIO AO+BIFEM W & OR WO CONTRAST  Result Date: 2022/05/05 CLINICAL DATA:  Claudication Mesenteric ischemia Recent exploratory laparotomy and superior mesenteric artery angioplasty EXAM: CT ANGIOGRAPHY OF ABDOMINAL AORTA WITH ILIOFEMORAL RUNOFF TECHNIQUE: Multidetector CT imaging of the abdomen, pelvis and lower extremities was performed using the standard protocol during bolus administration of intravenous contrast. Multiplanar CT image reconstructions and  MIPs were obtained to evaluate the vascular anatomy. RADIATION DOSE REDUCTION: This exam was performed according to the departmental dose-optimization program which includes automated exposure control, adjustment of the mA and/or kV according to patient size and/or use of iterative reconstruction technique. CONTRAST:  184m OMNIPAQUE IOHEXOL 350 MG/ML SOLN COMPARISON:  011-Jan-2024FINDINGS: VASCULAR Aorta: Low-density material again seen throughout the abdominal aorta, most likely a combination of noncalcified plaque and thrombus. The overall burden infrarenal abdominal aorta, just above the bifurcation which has progressed since the prior exam. Celiac: Unchanged occlusion of the celiac artery. SMA: Interval progression of narrowing at the origin of the superior mesenteric artery now with complete to near complete occlusion. There has been interval development of severe diffuse stenosis of the superior mesenteric artery which has progressed since prior examination. The distal SMA branches appear occluded. Renals: Mild narrowing of the proximal segment. No significant narrowing of the left main renal artery. IMA: Chronically occluded. RIGHT Lower Extremity Inflow: Right common, internal, and external iliac arteries are diminutive but patent. The overall diameter of these vessels have decreased since the prior examination. Outflow: Focal calcified plaque again noted in the common femoral artery which appears moderately narrowed. Profundus femoris and superficial femoral arteries are diminutive but patent. Popliteal artery appears occluded. Runoff: Flow reconstitutes within the proximal anterior tibial and peroneal arteries. The anterior tibial artery opacifies to the distal lower leg. The peroneal artery continues to the level the ankle. No significant opacification of the posterior tibial artery. LEFT Lower Extremity Inflow: Common and external iliac arteries are diminutive but patent. 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 Outflow: Moderate stenosis of the left common femoral artery due to noncalcified plaque. The amount of plaque  does not appear significantly increased but the overall diameter of the vessel has decreased since the prior exam. Profunda femoris artery is occluded. The superficial femoral artery is diminutive but patent. Proximal popliteal artery is patent, however the distal popliteal artery is likely occluded. Runoff: There is reconstitution of flow within the posterior tibial artery to the level of the ankle. No definitive opacification of the anterior tibial and peroneal arteries. Veins: No obvious venous abnormality within the limitations of this arterial phase study. Review of the MIP images confirms the above findings. NON-VASCULAR Lower chest: Airspace opacity within the lingula suspicious for pneumonitis. Atelectasis involving the basilar segments. The superior segment is still aerated. Hepatobiliary: Known focal infarct in segment 2 better visualized on prior examination. Hepatic infarcts difficult to evaluate on the current examination due to single arterial phase. No new infarct is identified. Minimal perihepatic free air likely related to recent postop status. Gallbladder is unremarkable. Pancreas: No significant abnormality. Spleen: Chronically atrophied nodular splenic tissue again seen in the left upper quadrant, likely related to multiple prior infarcts. Adrenals/Urinary Tract: Adrenal glands are normal. Irregular thinning of the renal cortices bilaterally is unchanged from prior examination and likely due to prior ischemic insult. No hydronephrosis. Stomach/Bowel: Fluid within distal esophagus and small hiatal hernia is indicative of gastroesophageal reflux. Mild nonspecific thickening of the distal esophageal wall, most likely related to esophagitis, underlying mass is difficult to exclude. The stomach is fluid-filled and mildly dilated. Appendix is normal. No dilated loops of  bowel to indicate ileus or obstruction. There is pneumatosis in the wall of the small bowel in the left lower quadrant, which is new since the prior CT angiography. Small amount of air also noted within the mesentery, adjacent to these small bowel loops. Scattered fat stranding seen within the abdomen most likely due to recent postop status. Lymphatic: No enlarged abdominal or pelvic lymph nodes. Reproductive: Uterus and bilateral adnexa are unremarkable. Other: 7.6 x 3.1 x 4.0 cm ovoid hyperdense structure within the subcutaneous fat, underlying the mid abdominal staple line is new compared to prior CT angiography from 04/30/2022 and is consistent with hematoma. Additional hematoma also noted just deep to the right rectus sheath measuring 8.1 x 2.3 x 4.1 cm. Focal ascites noted in the central upper abdomen, best seen on image 66 of series 5 measuring 7.9 x 3.3 x 4.8 cm. Musculoskeletal: No acute or significant osseous findings. IMPRESSION: VASCULAR 1. Interval progression of narrowing at the origin of the superior mesenteric artery now with complete to near complete occlusion. There has been interval development of severe diffuse stenosis of the superior mesenteric artery which has progressed since prior examination. The distal SMA branches appear occluded. 2. 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 3. Interval progression of narrowing of the infrarenal abdominal aorta, just above the bifurcation, with near complete occlusion. 4. Interval progression of narrowing of the left common femoral artery. The overall diameter of the left common femoral artery has decreased since the prior examination. 5. Occlusion of the distal left popliteal artery. 6. Right popliteal artery appears occluded. Flow reconstitutes within the proximal anterior tibial and peroneal arteries. The anterior tibial artery opacifies to the distal lower leg. The peroneal artery continues to the level the ankle. No significant opacification  of the posterior tibial artery. NON-VASCULAR 1. Interval development of pneumatosis in the wall of the small bowel in the left lower quadrant. Small amount of air also noted within the mesentery, adjacent to these small bowel loops. Findings are  suspicious for bowel ischemia. 2. Focal ascites noted in the central upper abdomen measuring 7.9 x 3.3 x 4.8 cm. 3. Hematomas noted within the midline subcutaneous anterior abdomen underlying the staple line and in the right anterior abdomen underlying the right rectus sheath. 4. Airspace opacity within the lingula suspicious for pneumonitis. Atelectasis of significant portions of the right lower lobe. These results were called by telephone at the time of interpretation on 05-19-22 at 11:55 AM to provider Gerri Lins PA-C, who verbally acknowledged these results. Electronically Signed   By: Miachel Roux M.D.   On: 05/19/22 12:25   DG CHEST PORT 1 VIEW  Result Date: 04/24/2022 CLINICAL DATA:  Postoperative state EXAM: PORTABLE CHEST 1 VIEW COMPARISON:  Portable exam 0932 hours compared to 10/22/2021 FINDINGS: RIGHT jugular line with tip projecting over SVC. Normal heart size, mediastinal contours, and pulmonary vascularity. Linear subsegmental atelectasis in lingula with additional subsegmental atelectasis LEFT lower lobe. Remaining lungs clear without pulmonary infiltrate, pleural effusion or pneumothorax. Osseous demineralization IMPRESSION: LEFT lung atelectasis as above. Otherwise negative exam. Electronically Signed   By: Lavonia Dana M.D.   On: 04/24/2022 10:08    Microbiology No results found for this or any previous visit (from the past 240 hour(s)).  Lab Basic Metabolic Panel: No results for input(s): "NA", "K", "CL", "CO2", "GLUCOSE", "BUN", "CREATININE", "CALCIUM", "MG", "PHOS" in the last 168 hours. Liver Function Tests: No results for input(s): "AST", "ALT", "ALKPHOS", "BILITOT", "PROT", "ALBUMIN" in the last 168 hours. No results for input(s):  "LIPASE", "AMYLASE" in the last 168 hours. No results for input(s): "AMMONIA" in the last 168 hours. CBC: No results for input(s): "WBC", "NEUTROABS", "HGB", "HCT", "MCV", "PLT" in the last 168 hours. Cardiac Enzymes: No results for input(s): "CKTOTAL", "CKMB", "CKMBINDEX", "TROPONINI" in the last 168 hours. Sepsis Labs: No results for input(s): "PROCALCITON", "WBC", "LATICACIDVEN" in the last 168 hours.  Procedures/Operations  Patient underwent angiogram, with SMA angioplasty and exploratory laparotomy 05/06/2022.  She underwent repeat laparotomy 05/19/22 after her deterioration.   Cherre Robins 05/22/2022, 8:55 AM
